# Patient Record
Sex: Male | Born: 1955
Health system: Southern US, Community
[De-identification: ages and names within clinical notes are randomized; demographics above are authoritative.]

## PROBLEM LIST (undated history)

## (undated) DIAGNOSIS — Z794 Long term (current) use of insulin: Secondary | ICD-10-CM

## (undated) DIAGNOSIS — B351 Tinea unguium: Secondary | ICD-10-CM

## (undated) DIAGNOSIS — R519 Headache, unspecified: Secondary | ICD-10-CM

## (undated) DIAGNOSIS — Z98811 Dental restoration status: Secondary | ICD-10-CM

## (undated) DIAGNOSIS — Z923 Personal history of irradiation: Secondary | ICD-10-CM

## (undated) DIAGNOSIS — H938X9 Other specified disorders of ear, unspecified ear: Secondary | ICD-10-CM

## (undated) DIAGNOSIS — F32A Depression, unspecified: Secondary | ICD-10-CM

## (undated) DIAGNOSIS — IMO0001 Reserved for inherently not codable concepts without codable children: Secondary | ICD-10-CM

## (undated) DIAGNOSIS — E669 Obesity, unspecified: Secondary | ICD-10-CM

## (undated) DIAGNOSIS — K219 Gastro-esophageal reflux disease without esophagitis: Secondary | ICD-10-CM

## (undated) DIAGNOSIS — G4730 Sleep apnea, unspecified: Secondary | ICD-10-CM

## (undated) DIAGNOSIS — I10 Essential (primary) hypertension: Secondary | ICD-10-CM

## (undated) DIAGNOSIS — L300 Nummular dermatitis: Secondary | ICD-10-CM

## (undated) DIAGNOSIS — G5601 Carpal tunnel syndrome, right upper limb: Secondary | ICD-10-CM

## (undated) DIAGNOSIS — M1990 Unspecified osteoarthritis, unspecified site: Secondary | ICD-10-CM

## (undated) DIAGNOSIS — E785 Hyperlipidemia, unspecified: Secondary | ICD-10-CM

## (undated) DIAGNOSIS — E119 Type 2 diabetes mellitus without complications: Secondary | ICD-10-CM

## (undated) DIAGNOSIS — B999 Unspecified infectious disease: Secondary | ICD-10-CM

## (undated) DIAGNOSIS — F419 Anxiety disorder, unspecified: Secondary | ICD-10-CM

## (undated) DIAGNOSIS — F329 Major depressive disorder, single episode, unspecified: Secondary | ICD-10-CM

## (undated) DIAGNOSIS — B370 Candidal stomatitis: Principal | ICD-10-CM

## (undated) HISTORY — DX: Other specified disorders of ear, unspecified ear: H93.8X9

## (undated) HISTORY — DX: Candidal stomatitis: B37.0

## (undated) HISTORY — DX: Sleep apnea, unspecified: G47.30

## (undated) HISTORY — DX: Obesity, unspecified: E66.9

## (undated) HISTORY — DX: Unspecified infectious disease: B99.9

## (undated) HISTORY — DX: Essential (primary) hypertension: I10

## (undated) HISTORY — DX: Tinea unguium: B35.1

## (undated) HISTORY — PX: CYST EXCISION: SHX5701

## (undated) HISTORY — DX: Hyperlipidemia, unspecified: E78.5

## (undated) HISTORY — PX: COLONOSCOPY: SHX174

## (undated) HISTORY — PX: FOOT SURGERY: SHX648

---

## 1998-01-05 ENCOUNTER — Emergency Department (HOSPITAL_COMMUNITY): Admission: EM | Admit: 1998-01-05 | Discharge: 1998-01-05 | Payer: Self-pay | Admitting: Emergency Medicine

## 2003-04-05 DIAGNOSIS — L509 Urticaria, unspecified: Secondary | ICD-10-CM | POA: Insufficient documentation

## 2003-08-30 ENCOUNTER — Emergency Department (HOSPITAL_COMMUNITY): Admission: EM | Admit: 2003-08-30 | Discharge: 2003-08-30 | Payer: Self-pay | Admitting: Emergency Medicine

## 2006-11-12 ENCOUNTER — Ambulatory Visit: Payer: Self-pay | Admitting: Family Medicine

## 2006-12-30 ENCOUNTER — Observation Stay (HOSPITAL_COMMUNITY): Admission: EM | Admit: 2006-12-30 | Discharge: 2006-12-30 | Payer: Self-pay | Admitting: Emergency Medicine

## 2006-12-30 ENCOUNTER — Ambulatory Visit: Payer: Self-pay | Admitting: Family Medicine

## 2006-12-30 HISTORY — PX: CARDIAC CATHETERIZATION: SHX172

## 2007-01-13 ENCOUNTER — Ambulatory Visit: Payer: Self-pay | Admitting: Family Medicine

## 2007-03-21 ENCOUNTER — Ambulatory Visit (HOSPITAL_COMMUNITY): Admission: RE | Admit: 2007-03-21 | Discharge: 2007-03-21 | Payer: Self-pay | Admitting: Surgery

## 2007-03-25 ENCOUNTER — Ambulatory Visit (HOSPITAL_COMMUNITY): Admission: RE | Admit: 2007-03-25 | Discharge: 2007-03-25 | Payer: Self-pay | Admitting: Gastroenterology

## 2007-03-25 HISTORY — PX: ESOPHAGOGASTRODUODENOSCOPY: SHX1529

## 2007-03-28 ENCOUNTER — Ambulatory Visit (HOSPITAL_COMMUNITY): Admission: RE | Admit: 2007-03-28 | Discharge: 2007-03-28 | Payer: Self-pay | Admitting: Surgery

## 2007-03-31 ENCOUNTER — Encounter: Admission: RE | Admit: 2007-03-31 | Discharge: 2007-03-31 | Payer: Self-pay | Admitting: Surgery

## 2007-05-22 ENCOUNTER — Ambulatory Visit: Payer: Self-pay | Admitting: Family Medicine

## 2007-07-10 ENCOUNTER — Ambulatory Visit: Payer: Self-pay | Admitting: Family Medicine

## 2007-07-10 ENCOUNTER — Ambulatory Visit: Payer: Self-pay | Admitting: *Deleted

## 2007-07-10 ENCOUNTER — Ambulatory Visit (HOSPITAL_COMMUNITY): Admission: RE | Admit: 2007-07-10 | Discharge: 2007-07-10 | Payer: Self-pay | Admitting: Family Medicine

## 2007-07-14 ENCOUNTER — Encounter: Admission: RE | Admit: 2007-07-14 | Discharge: 2007-10-12 | Payer: Self-pay | Admitting: Surgery

## 2007-07-17 ENCOUNTER — Ambulatory Visit: Payer: Self-pay | Admitting: Family Medicine

## 2007-07-28 ENCOUNTER — Inpatient Hospital Stay (HOSPITAL_COMMUNITY): Admission: RE | Admit: 2007-07-28 | Discharge: 2007-07-31 | Payer: Self-pay | Admitting: Surgery

## 2007-07-28 HISTORY — PX: ROUX-EN-Y PROCEDURE: SUR1287

## 2007-07-29 ENCOUNTER — Ambulatory Visit: Payer: Self-pay | Admitting: Surgery

## 2007-07-29 ENCOUNTER — Encounter (INDEPENDENT_AMBULATORY_CARE_PROVIDER_SITE_OTHER): Payer: Self-pay | Admitting: Surgery

## 2007-08-11 ENCOUNTER — Ambulatory Visit: Payer: Self-pay | Admitting: Family Medicine

## 2007-09-15 ENCOUNTER — Ambulatory Visit: Payer: Self-pay | Admitting: Family Medicine

## 2007-11-05 ENCOUNTER — Ambulatory Visit: Payer: Self-pay | Admitting: Family Medicine

## 2007-11-06 ENCOUNTER — Encounter: Admission: RE | Admit: 2007-11-06 | Discharge: 2007-11-06 | Payer: Self-pay | Admitting: Surgery

## 2007-11-12 ENCOUNTER — Encounter: Admission: RE | Admit: 2007-11-12 | Discharge: 2007-11-12 | Payer: Self-pay | Admitting: Family Medicine

## 2007-11-13 ENCOUNTER — Ambulatory Visit: Payer: Self-pay | Admitting: Family Medicine

## 2007-12-17 ENCOUNTER — Encounter: Admission: RE | Admit: 2007-12-17 | Discharge: 2007-12-17 | Payer: Self-pay | Admitting: Obstetrics and Gynecology

## 2008-04-14 ENCOUNTER — Encounter: Payer: Self-pay | Admitting: Gastroenterology

## 2008-04-14 ENCOUNTER — Ambulatory Visit: Payer: Self-pay | Admitting: Family Medicine

## 2008-04-23 ENCOUNTER — Ambulatory Visit: Payer: Self-pay | Admitting: Family Medicine

## 2008-05-11 ENCOUNTER — Ambulatory Visit: Payer: Self-pay | Admitting: Gastroenterology

## 2008-05-11 DIAGNOSIS — E1169 Type 2 diabetes mellitus with other specified complication: Secondary | ICD-10-CM | POA: Insufficient documentation

## 2008-05-11 DIAGNOSIS — E785 Hyperlipidemia, unspecified: Secondary | ICD-10-CM

## 2008-05-11 DIAGNOSIS — K219 Gastro-esophageal reflux disease without esophagitis: Secondary | ICD-10-CM | POA: Insufficient documentation

## 2008-05-11 DIAGNOSIS — E119 Type 2 diabetes mellitus without complications: Secondary | ICD-10-CM | POA: Insufficient documentation

## 2008-05-20 ENCOUNTER — Ambulatory Visit: Payer: Self-pay | Admitting: Family Medicine

## 2008-07-07 ENCOUNTER — Ambulatory Visit: Payer: Self-pay | Admitting: Gastroenterology

## 2008-12-23 ENCOUNTER — Ambulatory Visit: Payer: Self-pay | Admitting: Family Medicine

## 2009-01-13 ENCOUNTER — Ambulatory Visit: Payer: Self-pay | Admitting: Family Medicine

## 2009-01-20 IMAGING — US US ABDOMEN COMPLETE
1 series · 14 of 25 positions shown · non-contrast
Comparison: none

CLINICAL DATA: 51-year-old with morbid obesity.  Undergoing bariatric surgery evaluation.  GERD, hypertension, diabetes.
 ABDOMEN ULTRASOUND:
TECHNIQUE: Complete abdominal ultrasound examination was performed including evaluation of the liver, gallbladder, bile ducts, pancreas, kidneys, spleen, IVC, and abdominal aorta.

[Series 1: us abdomen complete · 14 of 51 slices shown]
[im 1/51]
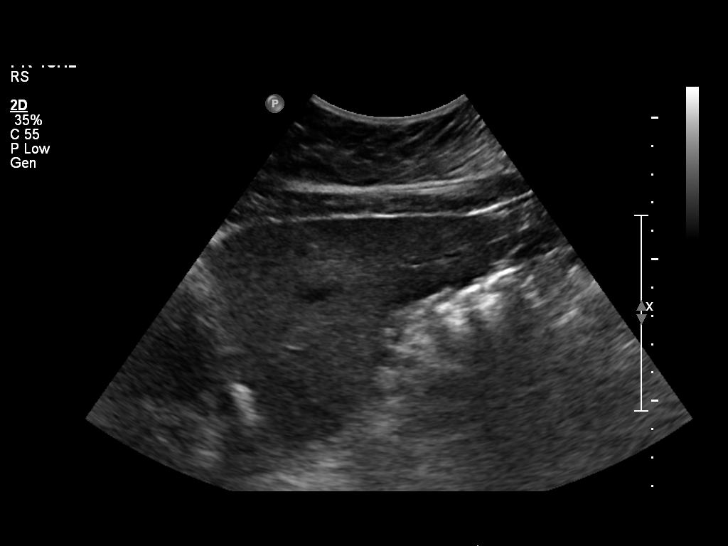
[im 5/51]
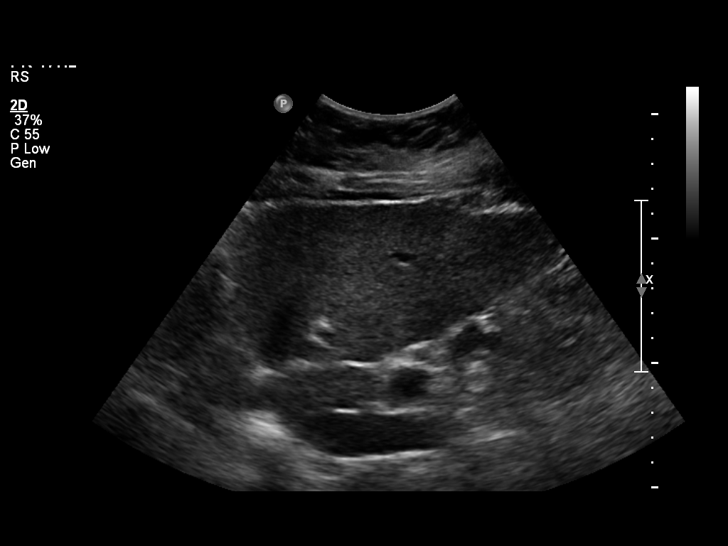
[im 9/51]
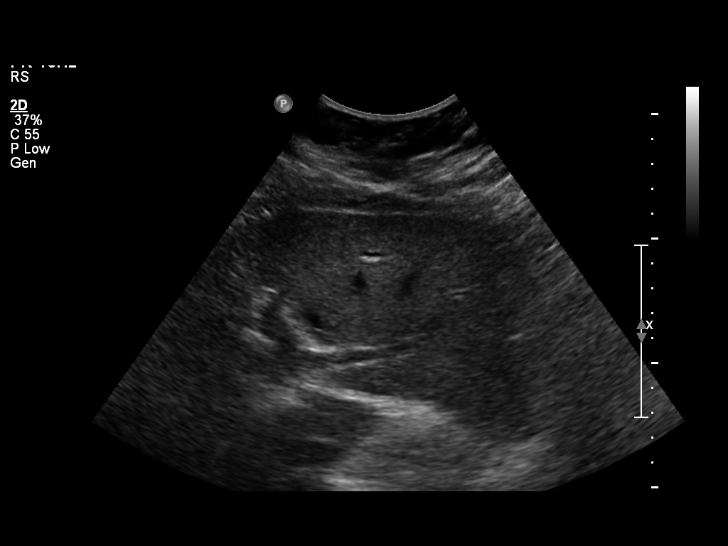
[im 13/51]
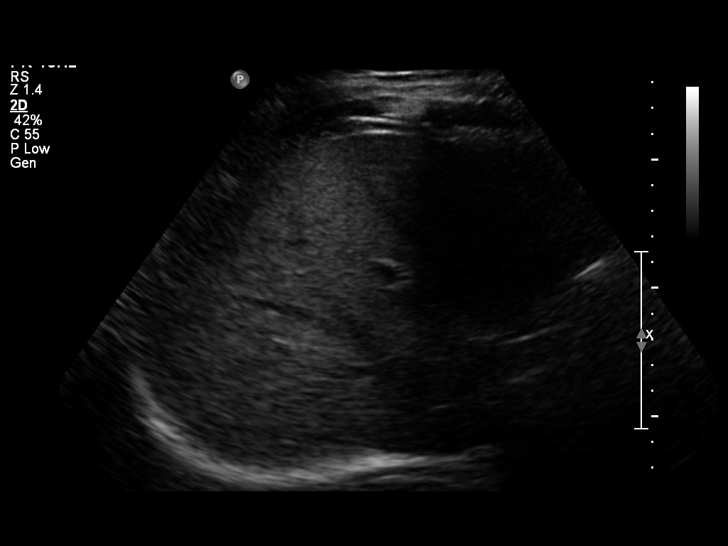
[im 17/51]
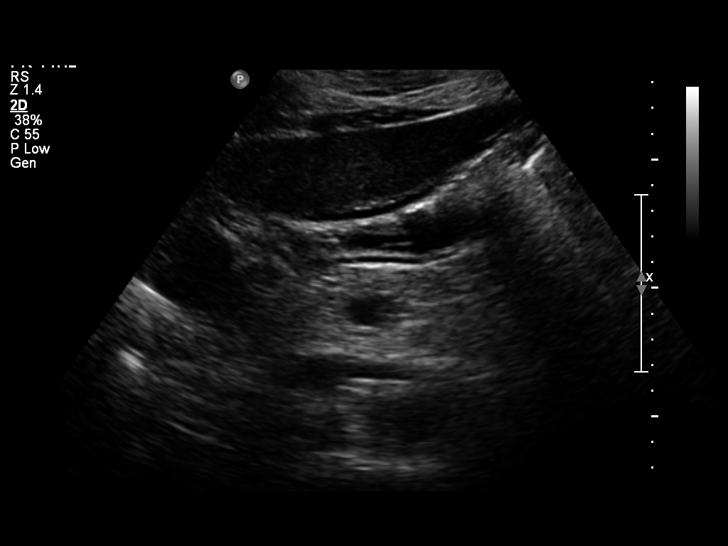
[im 19/51]
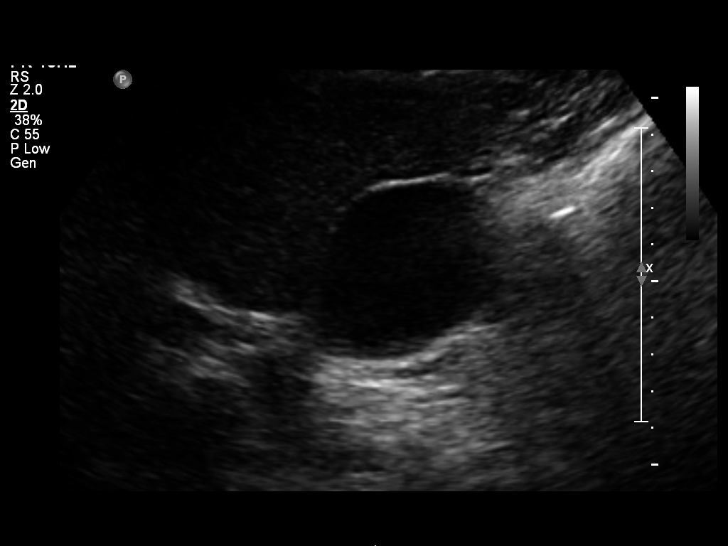
[im 23/51]
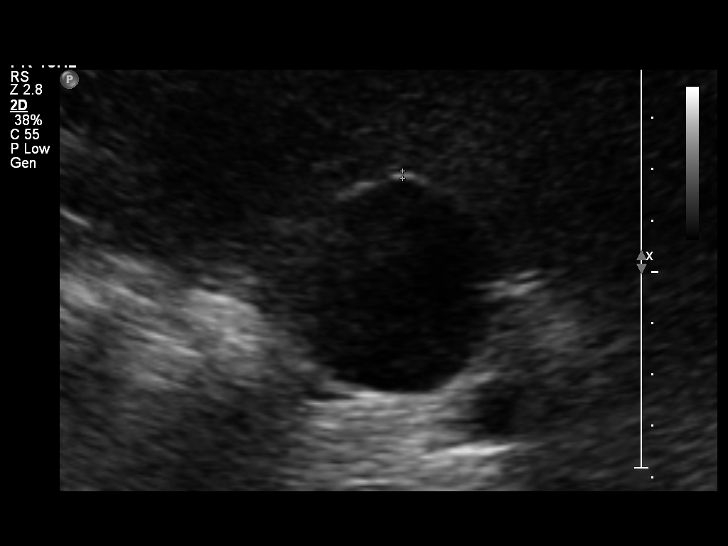
[im 28/51]
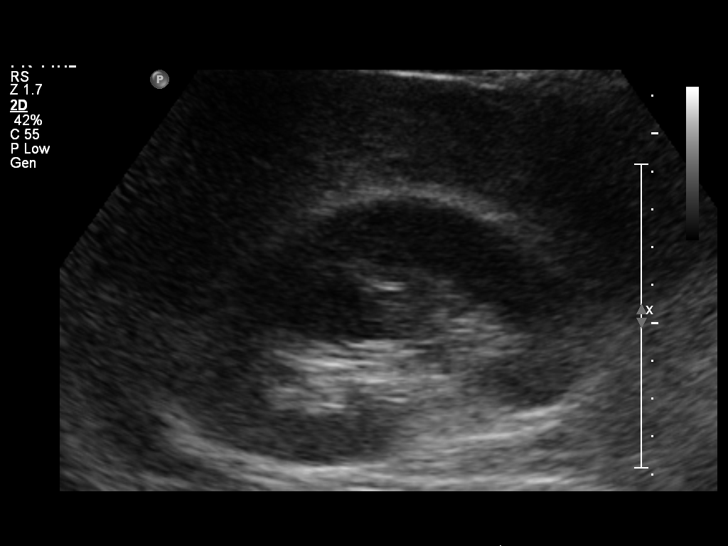
[im 32/51]
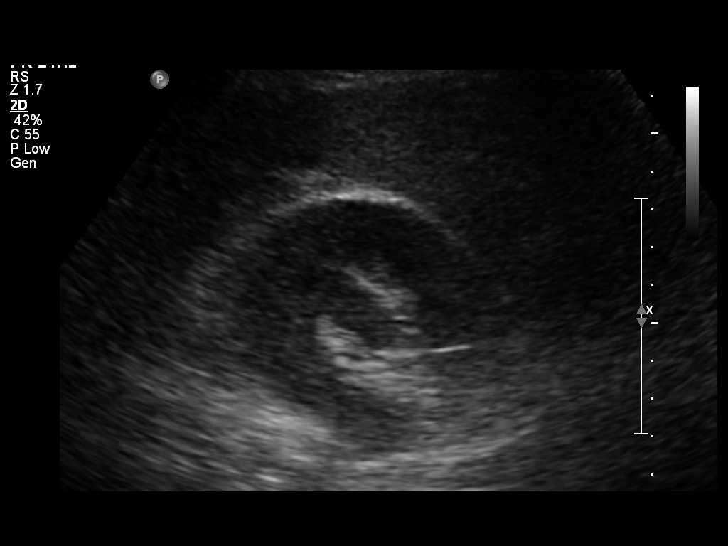
[im 34/51]
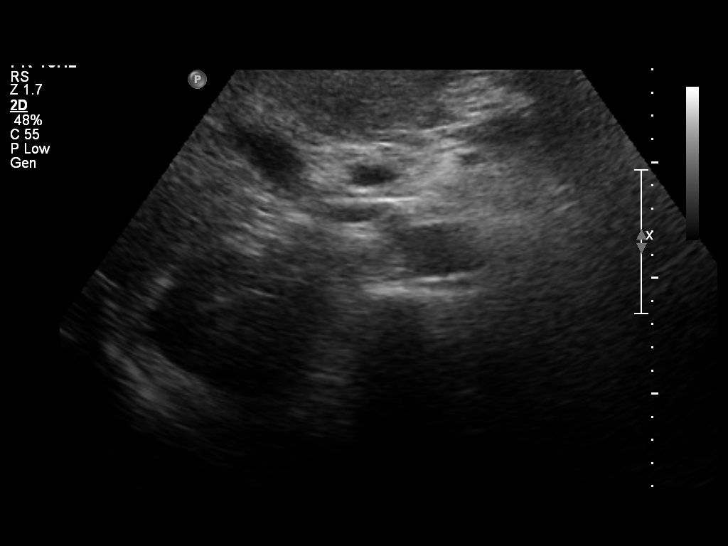
[im 38/51]
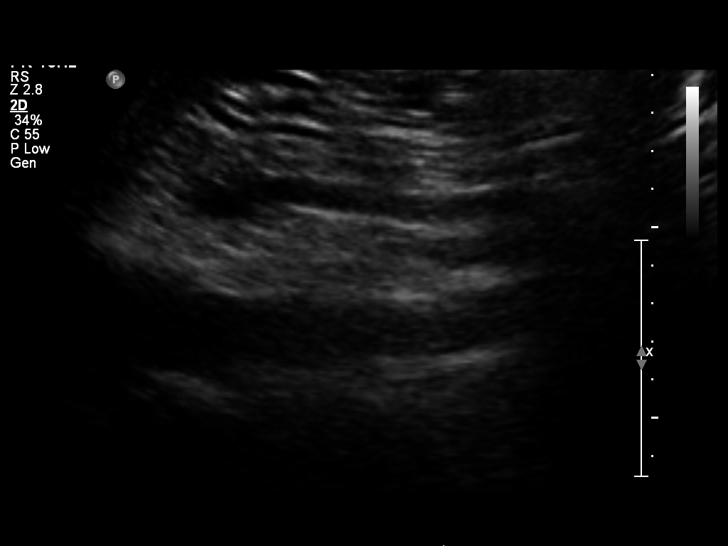
[im 42/51]
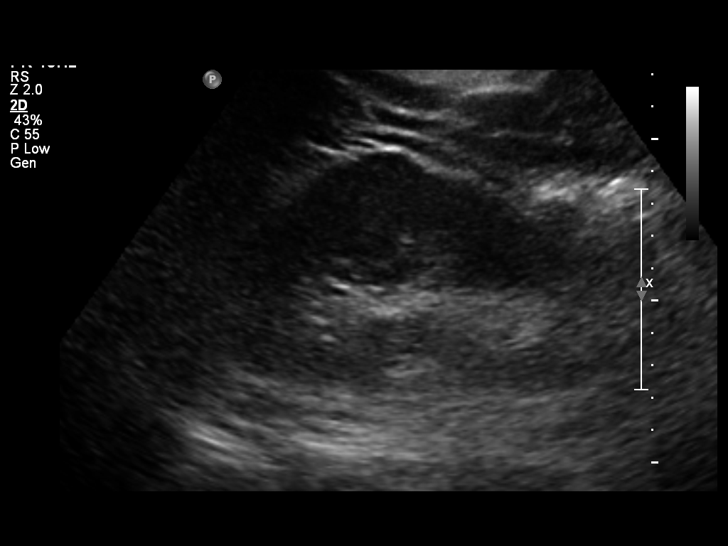
[im 46/51]
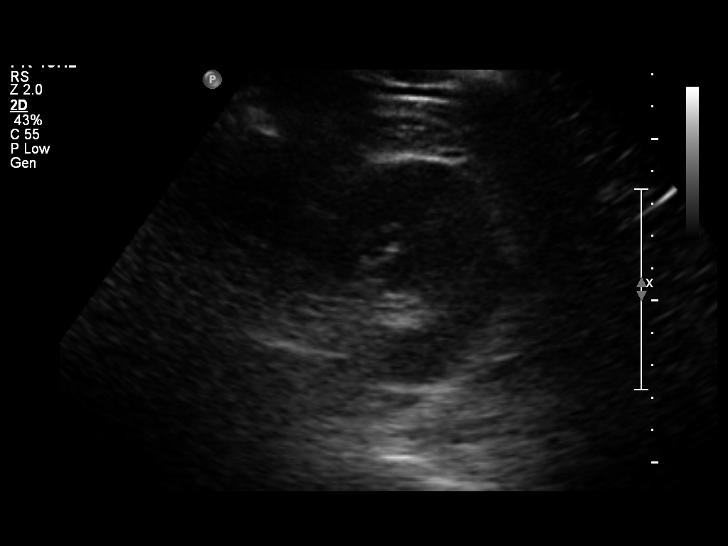
[im 51/51]
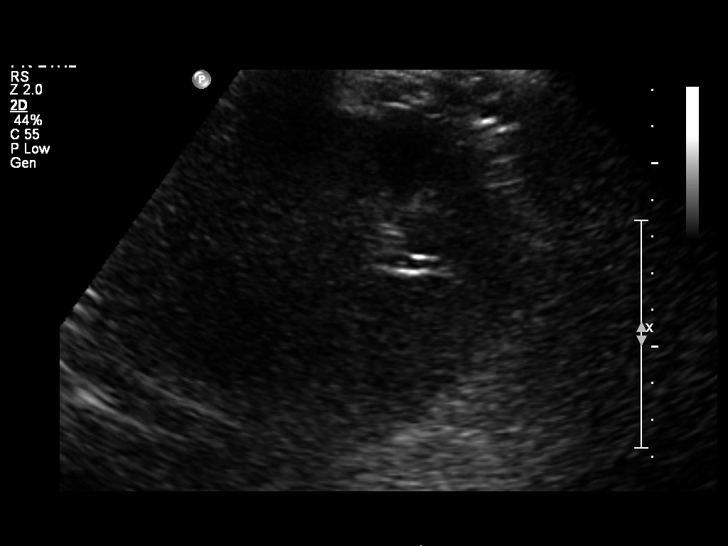

[14 of 25 positions shown; findings below may reference images not displayed]

FINDINGS: The liver, common bile duct, and gallbladder have a normal appearance.  No gallstones are identified.  The IVC and visualized abdominal aorta are normal in appearance.  Pancreatic tail is not well seen but the pancreatic head and neck have a normal appearance by ultrasound.  The spleen and kidneys are normal in their ultrasound appearance.
IMPRESSION: No evidence of acute abnormality of the abdomen.

## 2009-02-01 ENCOUNTER — Ambulatory Visit: Payer: Self-pay | Admitting: Family Medicine

## 2009-05-12 DIAGNOSIS — J069 Acute upper respiratory infection, unspecified: Secondary | ICD-10-CM | POA: Insufficient documentation

## 2009-06-01 ENCOUNTER — Ambulatory Visit: Payer: Self-pay | Admitting: Family Medicine

## 2009-09-19 ENCOUNTER — Ambulatory Visit: Payer: Self-pay | Admitting: Family Medicine

## 2010-01-16 ENCOUNTER — Ambulatory Visit: Payer: Self-pay | Admitting: Family Medicine

## 2010-04-10 ENCOUNTER — Ambulatory Visit: Payer: Self-pay | Admitting: Family Medicine

## 2010-08-01 NOTE — Miscellaneous (Signed)
Summary: Waiver of Liability Form/[facility]  Waiver of Liability Form/[facility]   Imported By: Esmeralda Links D'jimraou 05/13/2008 15:04:10  _____________________________________________________________________  External Attachment:    Type:   Image     Comment:   External Document

## 2010-08-01 NOTE — Procedures (Signed)
Summary: Colonoscopy   Colonoscopy  Procedure date:  07/07/2008  Findings:      Location:  [facility].    Procedures Next Due Date:    Colonoscopy: 07/2018  COLONOSCOPY PROCEDURE REPORT  PATIENT:  [name], [name]  MR#:  [id number] BIRTHDATE:   June 08, 1956   GENDER:   male  ENDOSCOPIST:   Venita Lick. Russella Dar, MD, [facility] Referred by: Sharlot Gowda, M.D.  PROCEDURE DATE:  07/07/2008 PROCEDURE:  Colonoscopy, diagnostic ASA CLASS:   Class II INDICATIONS: 1) screening   MEDICATIONS:    Fentanyl 75 mcg IV, Versed 7 mg IV  DESCRIPTION OF PROCEDURE:   After the risks benefits and alternatives of the procedure were thoroughly explained, informed consent was obtained.  Digital rectal exam was performed and revealed no abnormalities.   The LB [id number]-[id number] endoscope was introduced through the anus and advanced to the cecum, which was identified by both the appendix and ileocecal valve, without limitations.  The quality of the prep was good, using MoviPrep.  The instrument was then slowly withdrawn as the colon was fully examined. <<PROCEDUREIMAGES>>          <<OLD IMAGES>>  FINDINGS:  Mild diverticulosis was found in the ascending colon.  otherwise normal exam.   Retroflexed views in the rectum revealed hemorrhoids.  small, internal  The time to cecum =  2.75  minutes. The scope was then withdrawn (time =  9.5  min) from the patient and the procedure completed.  COMPLICATIONS:   None  ENDOSCOPIC IMPRESSION:  1) Mild diverticulosis in the ascending colon  2) Hemorrhoids RECOMMENDATIONS:  1) high fiber diet  REPEAT EXAM:   In 10 year(s) for Colonoscopy.   _______________________________ Venita Lick. Russella Dar, MD, [facility]    CC: Sharlot Gowda, MD

## 2010-08-01 NOTE — Assessment & Plan Note (Signed)
Summary: SCREEN FOR COLON/INSULIN DEPENDENT/YF   History of Present Illness Visit Type: consult Primary GI MD: Elie Goody MD [facility] Primary Provider: Sharlot Gowda, MD Requesting Provider: Sharlot Gowda, Md Chief Complaint: screening for colonoscopy/IDDM History of Present Illness:   This is a 55 year old male with insulin-dependent diabetes mellitus, hypertension and sleep apnea treated with CPAP and is sent for consideration of colonoscopy for colorectal cancer screening. He is status post a Roux-en-Y gastric bypass in January 2009 and has lost 110 pounds. He feels his weight is stabilized. He has frequent belching, bloating and notes a loss of appetite. He has been previously treated for GERD but has not recently been on medications. He notes alternating diarrhea and constipation.   GI Review of Systems    Reports belching, bloating, loss of appetite, and  weight loss.   Weight loss of 110 pounds   Denies abdominal pain, acid reflux, chest pain, dysphagia with liquids, dysphagia with solids, heartburn, nausea, vomiting, vomiting blood, and  weight gain.      Reports change in bowel habits, constipation, diarrhea, diverticulosis, and  fecal incontinence.     Denies anal fissure, black tarry stools, heme positive stool, hemorrhoids, irritable bowel syndrome, jaundice, light color stool, liver problems, rectal bleeding, and  rectal pain.    Prior Medications Reviewed Using: Patient Recall  Updated Prior Medication List: [facility] GAS RELIEF 125 MG CAPS (SIMETHICONE) one tablet by mouth as needed FLUOXETINE HCL 20 MG TABS (FLUOXETINE HCL) one tablet by mouth once daily WELLBUTRIN SR 200 MG XR12H-TAB (BUPROPION HCL) one tablet by mouth two times a day ASTELIN 137 MCG/SPRAY SOLN (AZELASTINE HCL) once daily ASPIRIN 81 MG  TABS (ASPIRIN) one tablet by mouth once daily LANTUS 100 UNIT/ML SOLN (INSULIN GLARGINE) 25 units once daily B-12 250 MCG TABS (CYANOCOBALAMIN) one tablet by mouth once daily  METFORMIN HCL 1000 MG TABS (METFORMIN HCL) one tablet by mouth two times a day CALCIUM 600 1500 MG TABS (CALCIUM CARBONATE) one tablet by mouth once daily MULTIVITAMINS  TABS (MULTIPLE VITAMIN) 1 tablet by mouth once daily  Current Allergies (reviewed today): No known allergies  Past Medical History:    Diabetes mellitus, insulin-dependent    Depression    Allergic rhinitis    Obesity    Hyperlipidemia    Hypertension    Sleep Apnea CPAP    GERD    Arthritis  Past Surgical History:    Roux-en-Y gastric bypass 07/2007  Family History:    Reviewed history from 05/07/2008 and no changes required:       Family History of Heart Disease:brother x 2 and mother        Family History of Diabetes: maternal grandmother,father, brother       No FH of Colon Cancer:  Social History:    Reviewed history from 05/07/2008 and no changes required:       Patient is a former smoker.        Alcohol Use - no       Daily Caffeine Use  Risk Factors:  Caffeine use:  2 drinks per day Alcohol use:  no  Review of Systems       The patient complains of arthritis/joint pain, depression-new, sleeping problems, and swelling of feet/legs.         The pertinent positives and negatives are noted as above and in the HPI. All other ROS were negative.   Vital Signs:  Patient Profile:   55 Years Old Male Height:  71 inches Weight:      256 pounds BMI:     35.83 BSA:     2.34 Pulse rate:   64 / minute Pulse rhythm:   regular BP sitting:   108 / 56  (right arm)  Vitals Entered By: Milford Cage CMA (May 11, 2008 2:02 PM)                  Physical Exam  General:     Well developed, well nourished, no acute distress. Head:     Normocephalic and atraumatic. Eyes:     PERRLA, no icterus. Ears:     Normal auditory acuity. Mouth:     No deformity or lesions, dentition normal. Neck:     Supple; no masses or thyromegaly. Lungs:     Clear throughout to auscultation. Heart:      Regular rate and rhythm; no murmurs, rubs,  or bruits. Abdomen:     Soft, nontender and nondistended. No masses, hepatosplenomegaly or hernias noted. Normal bowel sounds. Rectal:     deferred until time of colonoscopy.   Msk:     Symmetrical with no gross deformities. Normal posture. Pulses:     Normal pulses noted. Extremities:     No clubbing, cyanosis, edema or deformities noted. Neurologic:     Alert and  oriented x4;  grossly normal neurologically. Skin:     Intact without significant lesions or rashes. Cervical Nodes:     No significant cervical adenopathy. Psych:     Alert and cooperative. Normal mood and affect.   Impression & Recommendations:  Problem # 1:  SPECIAL SCREENING FOR MALIGNANT NEOPLASMS COLON (ICD-V76.51) He notes problems with alternating diarrhea and constipation. The risks, benefits and alternatives to colonoscopy with possible biopsy and possible polypectomy were discussed with the patient and they consent to proceed. The procedure will be scheduled electively. Orders: ZCOL (ZCOL)   Problem # 2:  DM (ICD-250.00) Standard insulin adjustments as per protocol to allow for clear liquid diet and n.p.o. status on the day of the colonoscopy.  Problem # 3:  GERD (ICD-530.81) GERD versus symptoms secondary to his gastric bypass. Begin Prilosec OTC 20 mg p.o. q.a.m. and antireflux measures.  Patient Instructions: 1)  You have been scheduled for a colonoscopy. 2)  Colonoscopy brochure given. 3)  Conscious Sedation brochure given. 4)  Copy Sent To: Thyra Breed, MD  Prescriptions: MOVIPREP 100 GM  SOLR (PEG-KCL-NACL-NASULF-NA ASC-C) As per prep instructions.  #1 x 0   Entered by:   Teresita Madura CMA   Authorized by:   Meryl Dare MD [facility]   Signed by:   Teresita Madura CMA on 05/11/2008   Method used:   Electronically to        [facility]* (retail)       [address]       [place], [place]  [address]       Ph: [phone]       Fax:  [phone]   RxID:   [id number]

## 2010-08-01 NOTE — Procedures (Signed)
Summary: Instructions for Procedure/[facility] (Outpt)  Instructions for Procedure/[facility] (Outpt)   Imported By: Esmeralda Links D'jimraou 05/13/2008 15:03:28  _____________________________________________________________________  External Attachment:    Type:   Image     Comment:   External Document

## 2010-10-16 ENCOUNTER — Ambulatory Visit (INDEPENDENT_AMBULATORY_CARE_PROVIDER_SITE_OTHER): Payer: 59 | Admitting: Family Medicine

## 2010-10-16 DIAGNOSIS — L0501 Pilonidal cyst with abscess: Secondary | ICD-10-CM

## 2010-10-16 DIAGNOSIS — M79609 Pain in unspecified limb: Secondary | ICD-10-CM

## 2010-10-16 LAB — GLUCOSE, CAPILLARY
Glucose-Capillary: 105 mg/dL — ABNORMAL HIGH (ref 70–99)
Glucose-Capillary: 111 mg/dL — ABNORMAL HIGH (ref 70–99)

## 2010-10-18 ENCOUNTER — Encounter: Payer: Self-pay | Admitting: Family Medicine

## 2010-10-18 DIAGNOSIS — L509 Urticaria, unspecified: Secondary | ICD-10-CM

## 2010-10-18 DIAGNOSIS — F32A Depression, unspecified: Secondary | ICD-10-CM

## 2010-10-18 DIAGNOSIS — F329 Major depressive disorder, single episode, unspecified: Secondary | ICD-10-CM

## 2010-10-18 DIAGNOSIS — J069 Acute upper respiratory infection, unspecified: Secondary | ICD-10-CM

## 2010-11-13 ENCOUNTER — Ambulatory Visit (INDEPENDENT_AMBULATORY_CARE_PROVIDER_SITE_OTHER): Payer: 59 | Admitting: Family Medicine

## 2010-11-13 ENCOUNTER — Encounter: Payer: Self-pay | Admitting: Family Medicine

## 2010-11-13 VITALS — BP 116/70 | HR 72 | Ht 61.32 in | Wt 285.0 lb

## 2010-11-13 DIAGNOSIS — R21 Rash and other nonspecific skin eruption: Secondary | ICD-10-CM

## 2010-11-13 DIAGNOSIS — J309 Allergic rhinitis, unspecified: Secondary | ICD-10-CM

## 2010-11-13 DIAGNOSIS — Z0000 Encounter for general adult medical examination without abnormal findings: Secondary | ICD-10-CM

## 2010-11-13 DIAGNOSIS — E1159 Type 2 diabetes mellitus with other circulatory complications: Secondary | ICD-10-CM

## 2010-11-13 DIAGNOSIS — E669 Obesity, unspecified: Secondary | ICD-10-CM

## 2010-11-13 DIAGNOSIS — E119 Type 2 diabetes mellitus without complications: Secondary | ICD-10-CM

## 2010-11-13 DIAGNOSIS — G4730 Sleep apnea, unspecified: Secondary | ICD-10-CM

## 2010-11-13 DIAGNOSIS — I10 Essential (primary) hypertension: Secondary | ICD-10-CM

## 2010-11-13 DIAGNOSIS — I152 Hypertension secondary to endocrine disorders: Secondary | ICD-10-CM

## 2010-11-13 DIAGNOSIS — E1169 Type 2 diabetes mellitus with other specified complication: Secondary | ICD-10-CM

## 2010-11-13 DIAGNOSIS — Z79899 Other long term (current) drug therapy: Secondary | ICD-10-CM

## 2010-11-13 DIAGNOSIS — E785 Hyperlipidemia, unspecified: Secondary | ICD-10-CM

## 2010-11-13 LAB — COMPREHENSIVE METABOLIC PANEL
ALT: 25 U/L (ref 0–53)
AST: 24 U/L (ref 0–37)
Albumin: 4.2 g/dL (ref 3.5–5.2)
Alkaline Phosphatase: 70 U/L (ref 39–117)
BUN: 15 mg/dL (ref 6–23)
CO2: 24 mEq/L (ref 19–32)
Calcium: 9.5 mg/dL (ref 8.4–10.5)
Chloride: 104 mEq/L (ref 96–112)
Creat: 0.79 mg/dL (ref 0.40–1.50)
Glucose, Bld: 120 mg/dL — ABNORMAL HIGH (ref 70–99)
Potassium: 4.5 mEq/L (ref 3.5–5.3)
Sodium: 140 mEq/L (ref 135–145)
Total Bilirubin: 0.5 mg/dL (ref 0.3–1.2)
Total Protein: 6.4 g/dL (ref 6.0–8.3)

## 2010-11-13 LAB — CBC WITH DIFFERENTIAL/PLATELET
Basophils Absolute: 0 10*3/uL (ref 0.0–0.1)
Basophils Relative: 0 % (ref 0–1)
Eosinophils Absolute: 0.1 10*3/uL (ref 0.0–0.7)
Eosinophils Relative: 2 % (ref 0–5)
HCT: 39.4 % (ref 39.0–52.0)
Hemoglobin: 13.8 g/dL (ref 13.0–17.0)
Lymphocytes Relative: 31 % (ref 12–46)
Lymphs Abs: 2.9 10*3/uL (ref 0.7–4.0)
MCH: 31.8 pg (ref 26.0–34.0)
MCHC: 35 g/dL (ref 30.0–36.0)
MCV: 90.8 fL (ref 78.0–100.0)
Monocytes Absolute: 0.7 10*3/uL (ref 0.1–1.0)
Monocytes Relative: 7 % (ref 3–12)
Neutro Abs: 5.7 10*3/uL (ref 1.7–7.7)
Neutrophils Relative %: 60 % (ref 43–77)
Platelets: 273 10*3/uL (ref 150–400)
RBC: 4.34 MIL/uL (ref 4.22–5.81)
RDW: 13.1 % (ref 11.5–15.5)
WBC: 9.5 10*3/uL (ref 4.0–10.5)

## 2010-11-13 LAB — LIPID PANEL
Cholesterol: 115 mg/dL (ref 0–200)
HDL: 38 mg/dL — ABNORMAL LOW (ref >39)
LDL Cholesterol: 60 mg/dL (ref 0–99)
Total CHOL/HDL Ratio: 3 Ratio
Triglycerides: 83 mg/dL (ref <150)
VLDL: 17 mg/dL (ref 0–40)

## 2010-11-13 LAB — POCT URINALYSIS DIPSTICK
Bilirubin, UA: NEGATIVE
Blood, UA: NEGATIVE
Ketones, UA: NEGATIVE
Leukocytes, UA: NEGATIVE
Nitrite, UA: NEGATIVE
Protein, UA: NEGATIVE
Spec Grav, UA: 1.01
Urobilinogen, UA: NEGATIVE
pH, UA: 7

## 2010-11-13 NOTE — Progress Notes (Signed)
  Subjective:    Patient ID: [name], male    DOB: 05-Feb-1956, 55 y.o.   MRN: [id number]  HPI he is here for complete examination. Continues to be followed by his endocrinologist for underlying diabetes. Uses over-the-counter medications for arthritis especially his shoulders. His allergies are under good control. He continues to be seen by a psychiatrist and psychologist. He continues to struggle with his weight in spite of having gastric bypass. His exercise is very sporadic. He  he does have a CPAP however he has not had it checked within the last year. Allergies seem to be under good control. He did have a colonoscopy in 2009. He does complain of lesion on his right wrist   Review of Systems  Constitutional: Negative.   HENT: Negative.   Eyes: Negative.   Respiratory: Positive for apnea.   Cardiovascular: Negative.   Gastrointestinal: Negative.   Genitourinary: Negative.   Musculoskeletal: Positive for arthralgias.  Hematological: Negative.        Objective:   Physical ExamBP 116/70  Pulse 72  Ht 5' 1.32" (1.558 m)  Wt 285 lb (129.275 kg)  BMI 53.29 kg/m2  General Appearance:    Alert, cooperative, no distress, appears stated age  Head:    Normocephalic, without obvious abnormality, atraumatic  Eyes:    PERRL, conjunctiva/corneas clear, EOM's intact, fundi    benign  Ears:    Normal TM's and external ear canals  Nose:   Nares normal, mucosa normal, no drainage or sinus   tenderness  Throat:   Lips, mucosa, and tongue normal; teeth and gums normal  Neck:   Supple, no lymphadenopathy;  thyroid:  no   enlargement/tenderness/nodules; no carotid   bruit or JVD  Back:    Spine nontender, no curvature, ROM normal, no CVA     tenderness  Lungs:     Clear to auscultation bilaterally without wheezes, rales or     ronchi; respirations unlabored  Chest Wall:    No tenderness or deformity   Heart:    Regular rate and rhythm, S1 and S2 normal, no murmur, rub   or gallop    Breast Exam:    No chest wall tenderness, masses or gynecomastia  Abdomen:     Soft, non-tender, nondistended, normoactive bowel sounds,    no masses, no hepatosplenomegaly  Genitalia:    Normal male external genitalia without lesions.  Testicles without masses.  No inguinal hernias.  Rectal:    Normal sphincter tone, no masses or tenderness; guaiac negative stool.  Prostate smooth, no nodules, not enlarged.  Extremities:   No clubbing, cyanosis or edema  Pulses:   2+ and symmetric all extremities  Skin:   Skin color, texture, turgor normal, no rashes or lesions  Lymph nodes:   Cervical, supraclavicular, and axillary nodes normal  Neurologic:   CNII-XII intact, normal strength, sensation and gait; reflexes 2+ and symmetric throughout          Psych:   Normal mood, affect, hygiene and grooming.    Exem of the right hand at the base of the thumb does show slight pigment changes.       Assessment & Plan:  See chronic problem list Recommend he use cortisone cream on the right and and if no improvement call me. We will also set him up to get his CPAP machine evaluated. Is to continue to be followed by the endocrinologist. I again reinforced diet and exercise with him. Use over-the-counter allergy meds as needed.

## 2010-11-13 NOTE — Patient Instructions (Signed)
Continue on all your medications. Use cortisone cream for the lesion on her hand. Followup with your diabetes specialist as well as Dr. Evelene Croon

## 2010-11-14 ENCOUNTER — Telehealth: Payer: Self-pay

## 2010-11-14 ENCOUNTER — Telehealth: Payer: Self-pay | Admitting: Family Medicine

## 2010-11-14 MED ORDER — OMEPRAZOLE 40 MG PO CPDR: 40.0000 mg | DELAYED_RELEASE_CAPSULE | Freq: Every day | ORAL | Status: DC

## 2010-11-14 NOTE — Op Note (Signed)
NAMERODRIGUEZ, AGUINALDO            ACCOUNT NO.:  [id number]   MEDICAL RECORD NO.:  [id number]          PATIENT TYPE:  AMB   LOCATION:  ENDO                         FACILITY:  [facility]   PHYSICIAN:  James L. Malon Kindle., M.D.DATE OF BIRTH:  [birth year]   DATE OF PROCEDURE:  03/25/2007  DATE OF DISCHARGE:                               OPERATIVE REPORT   PROCEDURE:  Esophagogastroduodenoscopy.   MEDICATIONS:  Cetacaine spray, fentanyl 100 mcg, Versed 7 mg IV.   INDICATIONS:  Esophageal reflux, chest pain.   DESCRIPTION OF PROCEDURE:  Procedure explained to the patient and  consent obtained.  Left lateral decubitus position, the Pentax scope  inserted and advanced under direct visualization.  Stomach entered,  pylorus identified and passed.  Duodenum including bulb and second  portion seen well.  Pyloric channel normal.  Antrum, body and fundus of  the stomach normal.  There was no significant hiatal hernia. The GE  junction appeared patent with no gross hiatal hernia or gross reflux.  Distal and proximal esophagus was endoscopically normal.  The patient  tolerated procedure well and was monitored throughout.   ASSESSMENT:  Nonerosive esophageal reflux disease.   PLAN:  Will continue on current medication, give reflux instructions and  see back in the office in three months.           ______________________________  Llana Aliment. Malon Kindle., M.D.     Waldron Session  D:  03/25/2007  T:  03/26/2007  Job:  [id number]   cc:   Sharlot Gowda, M.D.  Fax: [phone]   Cristy Hilts. Jacinto Halim, MD  Fax: [phone]   Sandria Bales. Ezzard Standing, M.D.  [address]  [place]  [place] [address]

## 2010-11-14 NOTE — Telephone Encounter (Signed)
Home # is wrong left message on work #

## 2010-11-14 NOTE — Discharge Summary (Signed)
[name], [name]            ACCOUNT NO.:  [id number]   MEDICAL RECORD NO.:  [id number]          PATIENT TYPE:  EMS   LOCATION:  [facility]                         FACILITY:  [facility]   PHYSICIAN:  Cristy Hilts. Jacinto Halim, MD       DATE OF BIRTH:  1955-12-03   DATE OF ADMISSION:  12/30/2006  DATE OF DISCHARGE:  12/30/2006                               DISCHARGE SUMMARY   DISCHARGE DIAGNOSES:  1. Chest pain secondary to gastroesophageal reflux disease.      a.     Obstructive coronary disease.  2. Insulin dependent diabetes mellitus.  3. Hypertension, stable.  4. Hyperlipidemia.  5. History of depression.  6. Obstructive sleep apnea, wears continuous positive airway pressure      and oxygen.   CONDITION ON DISCHARGE:  Improved.   PROCEDURE:  Combined left heart cath December 30, 2006, by Dr. Yates Decamp  with mild spasms RCA.  He also had a right heart cath.  EF was 55% to  60% with nonobstructive coronary disease.   He was also noted to have moderate pulmonary hypertension.  His cardiac  output was 7.7, cardiac index 2.9.   DISCHARGE MEDICATIONS:  Please note the patient did not know the dosage  of his medications, therefore, I do not have any way of finding that  information.  He will bring his meds to the office visit with Dr. Allyson Sabal.  1. Lipitor, take as at home.  2. Lisinopril 10 mg daily.  3. Metformin 1,000 mg twice a day, but do not take until Wednesday,      July 2, in the evening.  4. Prozac daily as before.  5. Wellbutrin twice a day as before.  6. Multivitamin daily.  7. Actos daily.  8. Byetta twice a day as before.  9. Lantus 35 units daily.  10.Amaryl 4 mg daily.  11.New amlodipine 5 mg daily.  12.Protonix 40 mg daily.   DISCHARGE INSTRUCTIONS:  1. No work until Monday, January 06, 2007.  2. Low-fat diabetic diet.  3. No driving for 2 days, no lifting for 2 days.  Increase activity      slowly. May shower, bathe starting tomorrow, December 31, 2006.  4. Wash cath site with soap and  water.  Call if any bleeding,      swelling, or drainage.  5. Stop smoking.  6. Followup with Dr. Allyson Sabal in 2 to 3 weeks, the office will call.  7. Followup with Dr. Susann Givens for continued pain.  8. Bring meds with you to see Dr. Allyson Sabal.   HISTORY OF PRESENT ILLNESS:  A 55 year old African American male came to  Dr. Jola Babinski office today for chest pain.  He was found to have some  new ST elevations in his lateral leads and was sent to [facility] by  EMS.  He states on December 30, 2006, he was at work, he had dizziness,  fuzziness in his head, went for a walk to wake himself up.  When walking  he had chest tightness that radiated to his sternum and neck.  No  diaphoresis.  He was short of  breath, but no nausea, vomiting.  Rated  the discomfort 5/10 and lasted greater than an hour.  While being  transported from Dr. Jola Babinski office had aspirin and nitroglycerin  spray, but did not seem to help the pain, in the emergency room it was a  scale of 1-10.   PAST MEDICAL HISTORY:  Premature family history of:  1. Coronary disease.  2. Hypertension.  3. Hyperlipidemia.  4. Depression.  5. Insulin dependent diabetes mellitus.  6. He did have a heart cath in 2005 with 3% proximal LAD.  7. He also has obstructive sleep apnea, wears CPAP.  8. Chronic sinusitis.   Family history, social history, review of systems, please see for all of  those the H&P.   ALLERGIES:  NO KNOWN ALLERGIES.   OUTPATIENT MEDICATIONS:  Same as discharge, except we added amlodipine  and Protonix.   PHYSICAL EXAMINATION:  VITAL SIGNS:  Blood pressure 110/47 to 125/52,  pulse 56, respirations are stable, 100% oxygen saturation.  GENERAL:  Alert and oriented.  In no acute distress.  LUNGS:  Clear.  HEART SOUNDS: S1, S2.  Regular rate and rhythm.  No murmur, gallop, rub,  or click.  ABDOMEN:  Positive bowel sounds.  Right groin cath site, stable.   LABORATORY DATA:  CBC on admission:  Hemoglobin 13.8, hematocrit  41.2,  WBC 7.8, and platelets 272.  Chemistry:  Sodium 127, potassium 4.2,  chloride 107, CO2 24, BUN 14, creatinine 0.74, glucose 122.  Coags pro  time 13.4, INR of 1, PTT 38.  GI:  AST 24, ALT 31, alkaline phosphatase  52, total bili 0.4, albumin 3.7.  CK was 270, MB 2.3, troponin I 0.03,  calcium 9.9, magnesium 2, TSH was pending at discharge and cardiac  markers were negative.   HOSPITAL COURSE:  The patient underwent cardiac catheterization by Dr.  Jacinto Halim, right and left heart cath.  He had a 30% to 40% RCA proximal  stenosis and 30% LAD stenosis, EF was 55% to 60%.  A pulmonary capillary  wedge pressure 24/28/19.  Cardiac output 7.7, index 2.9.  The patient  was then sent to short-stay until he recovered from his  cardiac cath.  In that unit he started having chest discomfort.  He will  be given a GI cocktail, if the GI cocktail does not relieve his  discomfort, we will keep him overnight for management and then discharge  the next day.  If his pain is relieved he will go home and he will have  his Protonix and amlodipine.      [name]. [place], N.P.      Cristy Hilts. Jacinto Halim, MD  Electronically Signed    LRI/MEDQ  D:  12/30/2006  T:  12/31/2006  Job:  [id number]   cc:   Nanetta Batty, M.D.  Sharlot Gowda, M.D.

## 2010-11-14 NOTE — Cardiovascular Report (Signed)
NAMEMEDHANSH, BRINKMEIER            ACCOUNT NO.:  [id number]   MEDICAL RECORD NO.:  [id number]          PATIENT TYPE:  INP   LOCATION:  1823                         FACILITY:  [facility]   PHYSICIAN:  Cristy Hilts. Jacinto Halim, MD       DATE OF BIRTH:  1956-06-09   DATE OF PROCEDURE:  DATE OF DISCHARGE:                            CARDIAC CATHETERIZATION   PROCEDURE PERFORMED:  1. Left ventriculography.  2. Selective right and left coronary arteriography.  3. Right femoral angiography and closure of the right femoral arterial      access with Star close.  4. Right heart catheterization.   INDICATIONS:  Mr. Basil Buffin is a 51-year gentleman with history  of hypertension, hyperlipidemia, diabetes who had been doing well and  has had a mild noncritical coronary artery disease by cardiac  catheterization in 2005, had gone for a walk and had suddenly developed  retrosternal chest heaviness and chest pressure which lasted for an hour  with radiation to his jaw.  This was associated with shortness of  breath.  Given this, he had seen Dr. Sharlot Gowda and because of ongoing  cardiovascular risk.  He was sent over to the [facility] emergency room  for further cardiovascular evaluation.  In the [facility] emergency  room, he was ruled out for myocardial infarction with first set of  cardiac enzymes.  He was completely chest pain free.  We gave him the  option of doing a stress test versus cardiac catheterization.  However,  the patient opted to go with cardiac catheterization and after having  explained the risks, benefits and alternatives, he was brought to the  cardiac catheterization lab to evaluate his coronary anatomy.  Right  heart catheterization was performed with a suspicion that he probably  has pulmonary tension, especially given his shortness of breath and  morbid obesity and obstructive sleep apnea.   HEMODYNAMIC DATA:   RIGHT HEART CATHETERIZATION:  RA pressure was 16/15 with a mean of  12  mmHg.  RA saturation 63%.   RV pressure was 50/3 with end diastolic pressure of 16 mmHg.  RV  saturation 66%.   PA pressure 51/80 with a mean of 32 mmHg.  PA saturation 66%.   Pulmonary capillary wedge was 24/28 with a mean of 19 mmHg.   Cardiac output 7.7.  Cardiac index 2.9 by Fick.   HEMODYNAMIC DATA:   LEFT HEART CATHETERIZATION.:  Left ventricle pressure 100 5/5 with end  diastolic pressure of 15 mmHg.  The aortic pressure 110/63 with a mean  of 84 mmHg.  There was no pressure gradient across the aortic valve.   ANGIOGRAPHIC DATA:  Left ventricle: Left ventricular systolic function  was normal with no regional wall motion abnormality.  Ejection fraction  was 55-60%.  No significant mitral regurgitation.   Right coronary artery:  Right coronary artery is a large vessel and  dominant vessel.  There was catheter induced spasm of the ostium of the  right coronary artery which was relieved with 200 mcg intracoronary  nitroglycerin administration.  There is a 30 to at most 40% ostial  stenosis  which is smooth.   Left main:  Left main is large vessel.  It is smooth and normal.   Circumflex:  Circumflex appears to be codominant.  It is a large-caliber  vessel.  It is tortuous.  Gives origin to moderate sized OM1, OM-2 and  large OM 3.  It is smooth and normal.   LAD:  LAD is large vessel.  It had mild 30% luminal irregularity in its  mid segment after the origin of a small diagonal one.  The LAD is  tortuous.   IMPRESSION:  1. Mild luminal irregularity of the coronary arteries.  No significant      coronary artery stenosis.  2. Moderate pulmonary hypertension with preserved cardiac output and      cardiac index.   RECOMMENDATIONS:  Suspect the chest discomfort was probably related to  his GERD, given morbid obesity and obstructive sleep apnea.  Would  recommend starting him on a PPI.  He will be stable for discharge from  the hospital, but he will be taken off of work  for at least a period of  2 days.  We will also add amlodipine for blood pressure control and also  could help with coronary spasm.  Aggressive risk modification is  indicated.  The patient has now been evaluated for bariatric-type  surgery, and I suspect that given his multiple cardiovascular risk that  this would be a good idea, and I will medically support this.   A total of 100 mL of contrast was utilized for diagnostic angiography.   TECHNIQUE OF THE PROCEDURE:  Under usual sterile precautions using a 6-  French right femoral arterial access and a 7-French right femoral venous  access, left and right heart catheterization was performed respectively.   Right heart catheterization was performed using a balloon-tip Swan-Ganz  catheter which was easily advanced into the pulmonary capillary wedge  position.  The right-sided hemodynamics was obtained and the catheter  was pulled out of body.   Using a 6-French multipurpose B2 catheter which was advanced to the  ascending aorta over the J-wire and into the left ventricle.  Left  ventriculography was performed both in LAO and RAO projection.  The  catheter was then pulled into the ascending aorta and right coronary  artery selectively engaged and angiography was performed.  Then the left  main coronary artery was selectively engaged, angiography was performed.  Because of incomplete visualization, a 6-French Judkins left five  diagnostic catheter was utilized and engage in the left main coronary  artery and angiography was performed.  Then using a 6-French NOTO  catheter, right coronary artery was selective engaged, 200 mcg  intracoronary was administered, angiography was repeated.  Then the  catheter was pulled out of  body in the usual fashion over the J-wire.  Right femoral angiography  was performed through the arterial access sheath and access closed with  Star close with excellent hemostasis.  Venous sheath was pulled in the   cardiac catheterization lab and adequate hemostasis obtained.  The  patient tolerated this.  No immediate complication noted.      Cristy Hilts. Jacinto Halim, MD  Electronically Signed     JRG/MEDQ  D:  12/30/2006  T:  12/31/2006  Job:  [id number]   cc:   Nanetta Batty, M.D.  Sharlot Gowda, M.D.

## 2010-11-14 NOTE — Telephone Encounter (Signed)
Patient would like a 90 day supply of Prilosec

## 2010-11-14 NOTE — Discharge Summary (Signed)
NAMEDEMETRIES, [name]            ACCOUNT NO.:  [id number]   MEDICAL RECORD NO.:  [id number]          PATIENT TYPE:  INP   LOCATION:  1535                         FACILITY:  [facility]   PHYSICIAN:  Sandria Bales. Ezzard Standing, M.D.  DATE OF BIRTH:  1955-07-28   DATE OF ADMISSION:  07/28/2007  DATE OF DISCHARGE:  07/31/2007                               DISCHARGE SUMMARY   Why can't the date of discharge be placed here?   DISCHARGE DIAGNOSES:  1. Morbid obesity with a weight of 362, a body mass index of 50.6.  2. Sleep apnea on continuous positive airway pressure.  3. Gastroesophageal reflux.  4. Arthritis of back and shoulders.  5. Insulin-dependent diabetes mellitus.  6. History of depression.  7. Was still smoking cigarettes at the time of surgery though that was      unknown to me.  The date of surgery he talked to me about this      after the operation.   OPERATION PERFORMED:  The patient had a laparoscopic Roux-en-Y gastric  bypass and upper endoscopy on July 28, 2007.   HISTORY OF ILLNESS:  [name] is a 55 year old, black male who is a  patient of Dr. Sharlot Gowda, who says he has been overweight since about  first grade.  He has had multiple medical problems associated with his  weight including: sleep apnea on CPAP, gastroesophageal reflux disease,  arthritis of his back and shoulders, insulin-dependent diabetes  mellitus.  He has also seen Dr. Cyndia Skeeters in the past for depression.   He has been through our preop bariatric program and is interested in  having a Roux-en-Y gastric bypass.  A thorough discussion was carried  out with the patient about the risks and benefits of bariatric surgery.  He also understands he has obligations about behavior and diet that are  essential for success after baritaric surgery.   The patient now presents to the hospital for bypass.   He was taken to the operating room the day of admission, where underwent  a laparoscopic Roux-en-Y gastric  bypass and upper endoscopy.   He did well with this.   On the first postop day he complained of epigastric pain but he had no  nausea.  His temperature was noted be 99, his pulse was 82. his blood  pressure 142/56, his hemoglobin 12, hematocrit 35, white blood count of  10,800.  His upper GI showed emptying of his gastric pouch without leak  and Dopplers of his lower extremities showed no obvious DVT.  His blood  sugars were stable about 209 and he tolerated well with his CPAP so he  was advanced, started on clear liquids.   On the second postop day he complained of  chest pain which he had early  was better but he did not think he was doing well enough drinking  liquids to go home, so he was advanced to a protein drink.  On the  second postop day his hemoglobin was 11, hematocrit 33, white blood  count of 10,300.  Because of his chest pain we checked some cardiac  functions including troponin  and CK-MB, which were both within normal  range.  His glucose of was 135.   By the third postop day he was feeling much better, tolerating his  liquids, ready for discharge.   His discharge instructions included going on protein shakes.  He was to  see our dietitian within one week's time.  He could shower.  He was  given Roxicet elixir for pain.  He was going to make the appointment to  see Dr. Leslie Dales within 1-2 weeks for follow-up on his diabetes.   He was to resume his home medications, which included:  1. Actos 45 mg daily.  2. Pantoprazole 40 mg daily.  3. Glimepiride 4 mg daily.  4. Fluoxetine 20 mg daily.  5. Budeprion 200 mg twice a day.  6. Metformin 1000 mg daily.  7. Lisinopril 10 mg nightly.  8. Ge [place] ti use Lantus insulin.  9. Byetta 10/10 mg twice a day though, again, he is supposed to check      with Dr. Leslie Dales regarding his diabetes to see how that checks      out.   His discharge condition was good.  There was no pathology.   [Note:  I found out after discharge  the patient was smoking cigarrettes  up until admission.  He was repeatedly warned preoperatively that  smoking is not acceptable behavior for a bypass patient.  He was warned  that there are significant risks, including, but not limited to GI  ulceration, stenosis, and bleeding which are a higher risk in a smoker.  I told him if I had known he was actively smoking, I would have canceled  his surgery.]      Sandria Bales. Ezzard Standing, M.D.  Electronically Signed     DHN/MEDQ  D:  08/09/2007  T:  08/11/2007  Job:  [id number]   cc:   Sharlot Gowda, M.D.  Fax: [phone]   Veverly Fells. Altheimer, M.D.  Fax: [phone]   Vilinda Flake, Ph.D.  Fax: [phone]

## 2010-11-14 NOTE — Op Note (Signed)
[name], [name]            ACCOUNT NO.:  [id number]   MEDICAL RECORD NO.:  [id number]          PATIENT TYPE:  INP   LOCATION:  1240                         FACILITY:  [facility]   PHYSICIAN:  Sandria Bales. Ezzard Standing, M.D.  DATE OF BIRTH:  [birth year]   DATE OF PROCEDURE:  07/28/2007  DATE OF DISCHARGE:                               OPERATIVE REPORT   PREOPERATIVE DIAGNOSES:  Morbid obesity, weight 362, body mass index  50.6.   POSTOPERATIVE DIAGNOSES:  Morbid obesity (weight 362) body mass index  50.6, adhesions of omentum to anterior abdominal wall.   PROCEDURE:  Lysis of anterior wall omental adhesions (spent 30 to 45  minutes), laparoscopic Roux-en-Y gastric bypass, upper endoscopy by Dr.  Wenda Low.   SURGEON:  Dr. Ezzard Standing.   FIRST ASSISTANT:  Dr. Wenda Low.   ANESTHESIA:  General endotracheal with approximately 30 mL of 0.25%  Marcaine with local anesthetic.   COMPLICATIONS:  None.   INDICATIONS FOR PROCEDURE:  [name] is a 55 year old black male who  is a patient [name] who has been through our preop bariatric  program.  He is interested in a laparoscopic Roux-en-Y gastric bypass  and understands the indications and potential risks of surgery.   The potential risks include bleeding, infection, bowel leak, deep venous  thrombosis with PE, and long-term nutritional consequences.   OPERATIVE NOTE:  The patient was placed in the supine position, given a  general endotracheal anesthetic. He had a Foley catheter in place, PAS  stockings in place.  He was given 2 grams of cefoxitin at the initiation  of the procedure. His abdomen was prepped with Betadine solution and  sterilely draped.   A time-out was held identifying the patient and the procedure.   The left upper quadrant was accessed with an Optiview trocar but upon  entering the abdominal cavity, I found myself subomental.  I did not see  any obvious injury to any bowel.  I then placed a second 11 mm  trocar to  the right of the umbilicus and again it went underneath the omentum. Mr.  Owen  had his omentum, probably better than 50% of it, stuck to the  anterior abdominal wall. He had no prior abdominal surgery.  The  etiology of this adhesed omentum was unclear.   I had to end up placing two 5 mm Ethicon trocars in the lower abdomen to  take down the omentum. I probably spent 30-45 minutes taking this  omentum down from the anterior abdominal wall so I could get all my  trocars in place.   Again there seemed to be no bowel involved in these adhesions, this was  just omentum attached to the anterior abdominal wall. I could then see  above the right and left lobes of the liver which were unremarkable. The  anterior wall of the stomach was unremarkable.   I ended up placing a 5-mm subxiphoid trocar for liver retractor and a 12  mm right subcostal trocar, a 12 mm right paramedian trocar, a 12 mm left  paramedian trocar and a 5 mm trocar out far lateral.  This made a total  of 9 trocars.   I first turned my attention to the small bowel. I counted 40 cm from the  ligament of Treitz down the jejunum.  I then divided the jejunum with  two firings of a white load of the Endo 45 Ethicon GIA stapler.   I tagged the future gastric limb with a Penrose drain and I we counted  100 cm down from this to make my future gastric limb and did a side-to-  side jejunojejunostomy.   The jejunojejunostomy was created using a white load of the Endo-GIA  stapler.  I then closed the enterotomy with two running 2-0 Vicryl  sutures and had to put a single additional suture in for the  anastomosis.  I then closed the mesenteric defect with 2-0 silk sutures.   I then placed Tisseel over the jejunojejunostomy.   I then divided the omentum for my future jejunal limb up to the stomach.  I then placed the liver retractor, retracted the left lobe of the liver,  identified the esophagogastric junction. I went  to the left of midline  and identified the angle of Hiss and dissected this area and used the  finger dissector. I then went along the lesser curvature approximately 4-  5 cm down from the GE junction and started my stomach pouch.  I got into  the lesser sac, I then did a single firing of the blue load of the Endo-  GIA 45 stapler.  I then did three firings of the 60 mm Ethicon stapler  and then a final 45-mm and these were all blue loads creating a stomach  pouch approximately 5 cm in length and 3 cm in width.   I then oversewed the distal gastric remnant with a locking 2-0 Vicryl  suture. I then placed Tisseel along the greater curvature of the new  stomach pouch.   I then brought the jejunal limb up antegastric, antecolic and did a  posterior row of the jejunum to the gastric pouch with it left facing  candy cane.  After this was sewn in place,  I then used a blue load of  the Endo-GIA stapler to do a gastrojejunostomy.  I closed this  enterotomy with two running 2-0 Vicryl sutures. Again I put a single 2-0  Vicryl suture, kind of oversewed the far right corner of the anastomosis  and then I did an anterior running 2-0 Vicryl suture.   I did go and for a Peterson's defect and was not happy that I thought I  could close this easily so I did not try to close this.   At this point, Dr. Wenda Low broke scrub and went up and passed an  endoscope down his mouth. I clamped off his jejunum while he did upper  endoscopy. He will dictate this portion of the operation. Basically he  saw the gastroesophageal junction about 42 cm, the anastomosis about 46-  47 cm. The mucosa in the stomach looked good and I flooded the upper  abdomen with saline, there was no bubbling and no evidence of leak.   I irrigated the abdomen with about a liter and a half of saline.  He had  had a little bit of oozing from where this omentum was taken down but I  saw no active bleeding at the end of the case. The  liver retractor was  then removed under direct visualization, the trocars were then removed.  I infiltrated the skin  with about 40 mL of 1% Xylocaine and closed the  skin with a running 5-0 Monocryl suture and then painted each one with  tincture of Benzoin and Steri-Strips.   Mr. Canal tolerated the procedure well and was transported to the  recovery room in good condition.  Sponge and needle counts were correct  at the end of the case.      Sandria Bales. Ezzard Standing, M.D.  Electronically Signed     DHN/MEDQ  D:  07/28/2007  T:  07/28/2007  Job:  [id number]   cc:   Sharlot Owen, M.D.  Fax: [phone]   Veverly Fells. Altheimer, M.D.  Fax: [phone]   Vilinda Flake, Ph.D.  Fax: [phone]

## 2010-11-15 ENCOUNTER — Telehealth: Payer: Self-pay

## 2010-11-15 NOTE — Telephone Encounter (Signed)
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

## 2010-11-15 NOTE — Telephone Encounter (Signed)
Pt # is home [phone] cell [phone] called pt and gave results of labs all ok pt said needs omeprazole refill through [facility]

## 2010-12-05 ENCOUNTER — Ambulatory Visit (INDEPENDENT_AMBULATORY_CARE_PROVIDER_SITE_OTHER): Payer: 59 | Admitting: Family Medicine

## 2010-12-05 ENCOUNTER — Encounter: Payer: Self-pay | Admitting: Family Medicine

## 2010-12-05 VITALS — BP 116/70 | HR 68 | Wt 286.0 lb

## 2010-12-05 DIAGNOSIS — N451 Epididymitis: Secondary | ICD-10-CM

## 2010-12-05 DIAGNOSIS — N453 Epididymo-orchitis: Secondary | ICD-10-CM

## 2010-12-05 MED ORDER — CIPROFLOXACIN HCL 500 MG PO TABS: 500.0000 mg | ORAL_TABLET | Freq: Two times a day (BID) | ORAL | Status: AC

## 2010-12-05 NOTE — Patient Instructions (Signed)
Take all the antibiotic and it or not totally back to normal give me a call

## 2010-12-05 NOTE — Progress Notes (Signed)
  Subjective:    Patient ID: [name], male    DOB: 02/17/1956, 55 y.o.   MRN: [id number]  HPI approximately 4 days ago he noted some left testicular soreness and now notes it on both sides. No discharge, dysuria, recent sexual activity ;no fever, chills, back pain history of trauma   Review of Systems     Objective:   Physical Exam abdominal exam shows no tenderness. Penis normal. Testes show the right to be slightly small and atrophied but the epididymis was tender to palpation. Left testes is also slightly small and soft with the epididymis being slightly tender       Assessment & Plan:  Epididymitis. Cipro for 2 weeks. He may also take Advil or Tylenol for pain. He is to call if not entirely better

## 2011-03-09 ENCOUNTER — Telehealth: Payer: Self-pay | Admitting: Family Medicine

## 2011-03-09 NOTE — Telephone Encounter (Signed)
Make a referral

## 2011-03-12 NOTE — Telephone Encounter (Signed)
appt for pt sept 18 at 10 :40 with dr.luptons

## 2011-03-22 LAB — BASIC METABOLIC PANEL
BUN: 16
CO2: 28
Calcium: 9.5
Chloride: 105
Creatinine, Ser: 1.03
GFR calc Af Amer: >60
GFR calc non Af Amer: >60
Glucose, Bld: 95
Potassium: 4.6
Sodium: 140

## 2011-03-22 LAB — CBC
HCT: 33.8 — ABNORMAL LOW
HCT: 35.9 — ABNORMAL LOW
Hemoglobin: 11.7 — ABNORMAL LOW
Hemoglobin: 12.4 — ABNORMAL LOW
MCHC: 34.4
MCHC: 34.7
MCV: 91.5
MCV: 91.8
Platelets: 269
Platelets: 278
RBC: 3.7 — ABNORMAL LOW
RBC: 3.92 — ABNORMAL LOW
RDW: 13.5
RDW: 14
WBC: 10.3
WBC: 10.8 — ABNORMAL HIGH

## 2011-03-22 LAB — URINALYSIS, ROUTINE W REFLEX MICROSCOPIC
Bilirubin Urine: NEGATIVE
Glucose, UA: NEGATIVE
Hgb urine dipstick: NEGATIVE
Ketones, ur: NEGATIVE
Nitrite: NEGATIVE
Protein, ur: NEGATIVE
Specific Gravity, Urine: 1.023
Urobilinogen, UA: 1
pH: 7

## 2011-03-22 LAB — DIFFERENTIAL
Basophils Absolute: 0
Basophils Absolute: 0.2 — ABNORMAL HIGH
Basophils Relative: 0
Basophils Relative: 2 — ABNORMAL HIGH
Eosinophils Absolute: 0
Eosinophils Absolute: 0.1
Eosinophils Relative: 0
Eosinophils Relative: 1
Lymphocytes Relative: 17
Lymphocytes Relative: 18
Lymphs Abs: 1.8
Lymphs Abs: 1.9
Monocytes Absolute: 0.9
Monocytes Absolute: 0.9
Monocytes Relative: 8
Monocytes Relative: 9
Neutro Abs: 7.4
Neutro Abs: 7.9 — ABNORMAL HIGH
Neutrophils Relative %: 72
Neutrophils Relative %: 73

## 2011-03-22 LAB — CARDIAC PANEL(CRET KIN+CKTOT+MB+TROPI)
CK, MB: 1.6
Relative Index: 0.5
Total CK: 304 — ABNORMAL HIGH
Troponin I: 0.04

## 2011-03-22 LAB — HEMOGLOBIN AND HEMATOCRIT, BLOOD
HCT: 35.1 — ABNORMAL LOW
HCT: 38.7 — ABNORMAL LOW
HCT: 41.5
Hemoglobin: 12.1 — ABNORMAL LOW
Hemoglobin: 13.3
Hemoglobin: 14.2

## 2011-04-18 LAB — COMPREHENSIVE METABOLIC PANEL
ALT: 31
AST: 24
Albumin: 3.7
Alkaline Phosphatase: 52
BUN: 14
CO2: 24
Calcium: 9
Chloride: 107
Creatinine, Ser: 0.74
GFR calc Af Amer: >60
GFR calc non Af Amer: >60
Glucose, Bld: 122 — ABNORMAL HIGH
Potassium: 4.2
Sodium: 137
Total Bilirubin: 0.4
Total Protein: 6.2

## 2011-04-18 LAB — POCT I-STAT 3, ART BLOOD GAS (G3+)
Acid-base deficit: 1
Bicarbonate: 24.1 — ABNORMAL HIGH
O2 Saturation: 87
Operator id: 222701
TCO2: 25
pCO2 arterial: 42.7
pH, Arterial: 7.361
pO2, Arterial: 55 — ABNORMAL LOW

## 2011-04-18 LAB — POCT I-STAT 3, VENOUS BLOOD GAS (G3P V)
Acid-base deficit: 1
Acid-base deficit: 1
Acid-base deficit: 2
Bicarbonate: 23.9
Bicarbonate: 25.1 — ABNORMAL HIGH
Bicarbonate: 25.5 — ABNORMAL HIGH
O2 Saturation: 63
O2 Saturation: 65
O2 Saturation: 66
Operator id: 222701
Operator id: 222701
Operator id: 274661
TCO2: 25
TCO2: 27
TCO2: 27
pCO2, Ven: 46.6
pCO2, Ven: 47.2
pCO2, Ven: 47.4
pH, Ven: 7.319 — ABNORMAL HIGH
pH, Ven: 7.334 — ABNORMAL HIGH
pH, Ven: 7.339 — ABNORMAL HIGH
pO2, Ven: 35
pO2, Ven: 36
pO2, Ven: 37

## 2011-04-18 LAB — POCT CARDIAC MARKERS
CKMB, poc: <1 — ABNORMAL LOW
Myoglobin, poc: 68
Operator id: 151321
Troponin i, poc: <0.05

## 2011-04-18 LAB — I-STAT 8, (EC8 V) (CONVERTED LAB)
Acid-base deficit: 2
BUN: 15
Bicarbonate: 24.2 — ABNORMAL HIGH
Chloride: 106
Glucose, Bld: 124 — ABNORMAL HIGH
HCT: 44
Hemoglobin: 15
Operator id: 151321
Potassium: 4.2
Sodium: 140
TCO2: 26
pCO2, Ven: 44.3 — ABNORMAL LOW
pH, Ven: 7.346 — ABNORMAL HIGH

## 2011-04-18 LAB — CBC
HCT: 41.2
Hemoglobin: 13.8
MCHC: 33.6
MCV: 93.7
Platelets: 242
RBC: 4.39
RDW: 14
WBC: 7.8

## 2011-04-18 LAB — TROPONIN I: Troponin I: 0.03

## 2011-04-18 LAB — DIFFERENTIAL
Basophils Absolute: 0.1
Basophils Relative: 1
Eosinophils Absolute: 0.1
Eosinophils Relative: 2
Lymphocytes Relative: 32
Lymphs Abs: 2.4
Monocytes Absolute: 0.8 — ABNORMAL HIGH
Monocytes Relative: 10
Neutro Abs: 4.3
Neutrophils Relative %: 56

## 2011-04-18 LAB — CK TOTAL AND CKMB (NOT AT ARMC)
CK, MB: 2.3
Relative Index: 0.9
Total CK: 270 — ABNORMAL HIGH

## 2011-04-18 LAB — TSH: TSH: 0.629

## 2011-04-18 LAB — MAGNESIUM: Magnesium: 2

## 2011-04-18 LAB — POCT I-STAT CREATININE
Creatinine, Ser: 0.8
Operator id: 151321

## 2011-04-18 LAB — APTT: aPTT: 38 — ABNORMAL HIGH

## 2011-04-18 LAB — PROTIME-INR
INR: 1
Prothrombin Time: 13.4

## 2011-06-05 ENCOUNTER — Ambulatory Visit: Payer: 59 | Admitting: Family Medicine

## 2011-06-12 ENCOUNTER — Ambulatory Visit: Payer: 59 | Admitting: Family Medicine

## 2011-06-19 ENCOUNTER — Ambulatory Visit (INDEPENDENT_AMBULATORY_CARE_PROVIDER_SITE_OTHER): Payer: 59 | Admitting: Family Medicine

## 2011-06-19 VITALS — BP 132/80 | HR 52 | Wt 296.0 lb

## 2011-06-19 DIAGNOSIS — R209 Unspecified disturbances of skin sensation: Secondary | ICD-10-CM

## 2011-06-19 DIAGNOSIS — R201 Hypoesthesia of skin: Secondary | ICD-10-CM

## 2011-06-19 NOTE — Patient Instructions (Signed)
Pay attention to your wrist position as well as elbow position. Also pay attention to which fingers are involved. Keep in touch with

## 2011-06-19 NOTE — Progress Notes (Signed)
  Subjective:    Patient ID: [name], male    DOB: 1956/06/06, 55 y.o.   MRN: [id number]  HPI He complains of a several month history of numbness in his hands especially when he types or is driving. He states that it is all of his fingers. When demonstrating his position with typing he does keep his wrist in neutral position.   Review of Systems     Objective:   Physical Exam Normal sensation and strength. Tinel and Phalen's test was negative.       Assessment & Plan:  Hypesthesias. Probable carpal tunnel although clinically he is not having enough symptoms. I recommend he keep his wrist in neutral position. Try to be aware of the pins and needles sensation in his fingers to determine which ones are in the house. He will keep in touch with me on this.

## 2011-08-13 ENCOUNTER — Telehealth: Payer: Self-pay | Admitting: Family Medicine

## 2011-08-13 NOTE — Telephone Encounter (Signed)
Called and notified pt. Pt is Scheduled to come in tomorrow @ 3

## 2011-08-13 NOTE — Telephone Encounter (Signed)
Have him set up an appointment to discuss this further

## 2011-08-13 NOTE — Telephone Encounter (Signed)
PT CALLED AND STATED THAT YOU WANTED HIM TO CALL AND GIVE MORE INFO ON SYMPTOMS.  PT STATES THAT NUMBNESS IS MAINLY WHEN HE IS GRIPPING SOMETHING. NUMBNESS  IS LOCATED IN MIDDLE AND LITTLE FINGER AND MIGRATES UP TO ELBOW. PT STATES IT IS IN BOTH HANDS BUT MAINLY IN RIGHT. PT ALSO STATES THAT IS SOMETIMES GOES INTO OTHER FINGERS AS WELL.

## 2011-08-14 ENCOUNTER — Encounter: Payer: Self-pay | Admitting: Family Medicine

## 2011-08-14 ENCOUNTER — Ambulatory Visit (INDEPENDENT_AMBULATORY_CARE_PROVIDER_SITE_OTHER): Payer: 59 | Admitting: Family Medicine

## 2011-08-14 VITALS — BP 116/70 | HR 67 | Ht 72.0 in | Wt 299.0 lb

## 2011-08-14 DIAGNOSIS — G5620 Lesion of ulnar nerve, unspecified upper limb: Secondary | ICD-10-CM

## 2011-08-14 NOTE — Progress Notes (Signed)
  Subjective:    Patient ID: [name], male    DOB: [birth year], 56 y.o.   MRN: [id number]  HPI He returns for a recheck on continued difficulty with tingling sensation in his hands. He demonstrates that with his elbow bent at roughly 90 he does have symptoms in his third fourth and fifth digits.   Review of Systems     Objective:   Physical Exam Normal motion of the elbows and wrists. Strength and sensation normal.       Assessment & Plan:  Probable ulnar neuropathy. Recommend he keep his elbow as straight as possible and if symptoms continue in spite of keeping his elbows at 90 or greater, he will return for probable nerve conduction studies

## 2011-08-14 NOTE — Patient Instructions (Signed)
Keep your elbows between 90 and 180. If you still have symptoms set up an appointment for further evaluation and testing

## 2011-12-10 ENCOUNTER — Telehealth: Payer: Self-pay | Admitting: Family Medicine

## 2011-12-10 NOTE — Telephone Encounter (Signed)
Left message for pt to make appt per jcl

## 2011-12-10 NOTE — Telephone Encounter (Signed)
Needs an appt

## 2011-12-13 ENCOUNTER — Encounter: Payer: Self-pay | Admitting: Family Medicine

## 2011-12-13 ENCOUNTER — Ambulatory Visit (INDEPENDENT_AMBULATORY_CARE_PROVIDER_SITE_OTHER): Payer: 59 | Admitting: Family Medicine

## 2011-12-13 VITALS — BP 122/78 | HR 72 | Wt 295.0 lb

## 2011-12-13 DIAGNOSIS — Z6841 Body Mass Index (BMI) 40.0 and over, adult: Secondary | ICD-10-CM

## 2011-12-13 DIAGNOSIS — G5623 Lesion of ulnar nerve, bilateral upper limbs: Secondary | ICD-10-CM

## 2011-12-13 DIAGNOSIS — G5620 Lesion of ulnar nerve, unspecified upper limb: Secondary | ICD-10-CM

## 2011-12-13 NOTE — Progress Notes (Signed)
  Subjective:    Patient ID: [name], male    DOB: [birth year], 56 y.o.   MRN: [id number]  HPI He is here for consult concerning a several month history of bilateral tingling sensation in his hands. He describes tingling mainly in the fourth and fifth fingers. He states that it usually bothers him when he bends his elbow. He also can notice when he is doing typing. He has a previous history of gastric bypass and review of  his record indicates weight gain. He does admit to stress eating.   Review of Systems     Objective:   Physical Exam Alert and in no distress. Weight is recorded and shows an elevation of close to 50 pounds. Normal sensation and strength of his hands. Flexion of both elbows did cause a recurrence of his symptoms. Tinel and Phalen's at the wrist was negative.       Assessment & Plan:   1. Ulnar neuropathy of both upper extremities   2. Morbid obesity with BMI of 40.0-44.9, adult    I explained to him the problem with the ulnar neuropathy especially with elbow position. Also recommend he keep track of wrist position to see if there might be a component of median neuropathy. Discussed keeping his wrist in the neutral position as well as not bending his elbow less than 90. I also discussed the weight with him. He now realizes that his real problem is psychological in that he eats when he gets stressed. He admits to having low self-esteem due to his weight, sexual preferences and his race. Strongly encouraged him to continue in counseling. 30 minutes spent discussing all these issues with him.

## 2011-12-13 NOTE — Patient Instructions (Addendum)
Keep track of your wrist and elbow position in terms of the tingling sensation. Keep her elbow no greater than 90 and make sure your wrist is neutral. Keep working with Lyanne [facility]

## 2012-02-24 ENCOUNTER — Other Ambulatory Visit: Payer: Self-pay | Admitting: Family Medicine

## 2012-11-10 ENCOUNTER — Telehealth: Payer: Self-pay | Admitting: Family Medicine

## 2012-11-10 MED ORDER — AZELASTINE HCL 0.1 % NA SOLN: 1.0000 | NASAL | Status: DC

## 2012-11-10 NOTE — Telephone Encounter (Signed)
Ok per [employer].

## 2012-11-18 ENCOUNTER — Encounter: Payer: Self-pay | Admitting: Family Medicine

## 2012-11-18 ENCOUNTER — Ambulatory Visit (INDEPENDENT_AMBULATORY_CARE_PROVIDER_SITE_OTHER): Payer: 59 | Admitting: Family Medicine

## 2012-11-18 VITALS — BP 118/70 | HR 62 | Temp 97.9°F | Resp 16 | Wt 286.0 lb

## 2012-11-18 DIAGNOSIS — G5600 Carpal tunnel syndrome, unspecified upper limb: Secondary | ICD-10-CM

## 2012-11-18 DIAGNOSIS — M79609 Pain in unspecified limb: Secondary | ICD-10-CM

## 2012-11-18 DIAGNOSIS — M79675 Pain in left toe(s): Secondary | ICD-10-CM

## 2012-11-18 NOTE — Patient Instructions (Signed)
Use wrist splints especially at night and you can use them during the day if you need to. Try to keep your hand in a neutral position. If this doesn't work then let me know and we will get some nerve conduction studies

## 2012-11-18 NOTE — Progress Notes (Signed)
  Subjective:    Patient ID: [name], male    DOB: [birth year], 57 y.o.   MRN: [id number]  HPI He is here for 3 issues. He has right arm pain, bilateral hand pain and left great toe discomfort. He notes that the right arm pain bothers him at night and he also notes that he has difficulty with the pain when he is using his hands. He does not note difficulty on the left side. The right arm pain has been going approximately one month however the hands have been bothering him for 2 months. He has a previous history of similar symptoms. He notes that the pain is in the median nerve distribution and he also notes when he hangs his hands at his side the hand and right shoulder pain goes away. He also complains of a several month history of left great toe pain but no redness, swelling or tenderness. No other joints are involved.   Review of Systems     Objective:   Physical Exam Full motion of the shoulder without pain. Bilateral Tinel's and Phalen's test was negative. Normal sensation. Left great toe does show good motion with no redness swelling or tenderness to palpation       Assessment & Plan:  CTS (carpal tunnel syndrome), unspecified laterality  Great toe pain, left recommend using wrist splints especially at night and also demonstrated neutral position of the wrist especially during the day. He cannot see his splints during the day if he needs to. If he continues to have difficulty, further nerve conduction studies will need to be done. X-ray of his toe will be done. Doubt gout but certainly a possibility

## 2012-11-21 ENCOUNTER — Ambulatory Visit
Admission: RE | Admit: 2012-11-21 | Discharge: 2012-11-21 | Disposition: A | Discharge status: Home / Self Care | Payer: 59 | Source: Ambulatory Visit | Attending: Family Medicine | Admitting: Family Medicine

## 2012-11-21 DIAGNOSIS — M79675 Pain in left toe(s): Secondary | ICD-10-CM

## 2012-11-21 NOTE — Progress Notes (Signed)
Quick Note:  Left message on pt home # word for word He has arthritis of the toe. Explained that it is not gout. Supportive care including using arch supports and firmer soled shoes. ______

## 2012-12-22 ENCOUNTER — Ambulatory Visit (INDEPENDENT_AMBULATORY_CARE_PROVIDER_SITE_OTHER): Payer: 59 | Admitting: Family Medicine

## 2012-12-22 ENCOUNTER — Emergency Department (HOSPITAL_COMMUNITY): Payer: 59

## 2012-12-22 ENCOUNTER — Emergency Department (HOSPITAL_COMMUNITY)
Admission: EM | Admit: 2012-12-22 | Discharge: 2012-12-22 | Disposition: A | Discharge status: Home / Self Care | Payer: 59 | Attending: Emergency Medicine | Admitting: Emergency Medicine

## 2012-12-22 ENCOUNTER — Encounter: Payer: Self-pay | Admitting: Family Medicine

## 2012-12-22 VITALS — BP 100/60 | HR 90 | Temp 102.7°F | Wt 281.0 lb

## 2012-12-22 DIAGNOSIS — Z7982 Long term (current) use of aspirin: Secondary | ICD-10-CM | POA: Insufficient documentation

## 2012-12-22 DIAGNOSIS — R1031 Right lower quadrant pain: Secondary | ICD-10-CM

## 2012-12-22 DIAGNOSIS — I10 Essential (primary) hypertension: Secondary | ICD-10-CM | POA: Insufficient documentation

## 2012-12-22 DIAGNOSIS — Z862 Personal history of diseases of the blood and blood-forming organs and certain disorders involving the immune mechanism: Secondary | ICD-10-CM | POA: Insufficient documentation

## 2012-12-22 DIAGNOSIS — Z87891 Personal history of nicotine dependence: Secondary | ICD-10-CM | POA: Insufficient documentation

## 2012-12-22 DIAGNOSIS — Z9884 Bariatric surgery status: Secondary | ICD-10-CM | POA: Insufficient documentation

## 2012-12-22 DIAGNOSIS — R195 Other fecal abnormalities: Secondary | ICD-10-CM

## 2012-12-22 DIAGNOSIS — R197 Diarrhea, unspecified: Secondary | ICD-10-CM | POA: Insufficient documentation

## 2012-12-22 DIAGNOSIS — I2510 Atherosclerotic heart disease of native coronary artery without angina pectoris: Secondary | ICD-10-CM | POA: Insufficient documentation

## 2012-12-22 DIAGNOSIS — Z794 Long term (current) use of insulin: Secondary | ICD-10-CM | POA: Insufficient documentation

## 2012-12-22 DIAGNOSIS — Z951 Presence of aortocoronary bypass graft: Secondary | ICD-10-CM | POA: Insufficient documentation

## 2012-12-22 DIAGNOSIS — R509 Fever, unspecified: Secondary | ICD-10-CM

## 2012-12-22 DIAGNOSIS — Z8659 Personal history of other mental and behavioral disorders: Secondary | ICD-10-CM | POA: Insufficient documentation

## 2012-12-22 DIAGNOSIS — Z8639 Personal history of other endocrine, nutritional and metabolic disease: Secondary | ICD-10-CM | POA: Insufficient documentation

## 2012-12-22 DIAGNOSIS — Z79899 Other long term (current) drug therapy: Secondary | ICD-10-CM | POA: Insufficient documentation

## 2012-12-22 DIAGNOSIS — E119 Type 2 diabetes mellitus without complications: Secondary | ICD-10-CM | POA: Insufficient documentation

## 2012-12-22 DIAGNOSIS — E785 Hyperlipidemia, unspecified: Secondary | ICD-10-CM | POA: Insufficient documentation

## 2012-12-22 DIAGNOSIS — Z8669 Personal history of other diseases of the nervous system and sense organs: Secondary | ICD-10-CM | POA: Insufficient documentation

## 2012-12-22 DIAGNOSIS — Z8709 Personal history of other diseases of the respiratory system: Secondary | ICD-10-CM | POA: Insufficient documentation

## 2012-12-22 LAB — COMPREHENSIVE METABOLIC PANEL
ALT: 13 U/L (ref 0–53)
AST: 20 U/L (ref 0–37)
Albumin: 3.5 g/dL (ref 3.5–5.2)
Alkaline Phosphatase: 70 U/L (ref 39–117)
BUN: 11 mg/dL (ref 6–23)
CO2: 23 mEq/L (ref 19–32)
Calcium: 8.9 mg/dL (ref 8.4–10.5)
Chloride: 98 mEq/L (ref 96–112)
Creatinine, Ser: 0.96 mg/dL (ref 0.50–1.35)
GFR calc Af Amer: >90 mL/min (ref >90)
GFR calc non Af Amer: >90 mL/min (ref >90)
Glucose, Bld: 161 mg/dL — ABNORMAL HIGH (ref 70–99)
Potassium: 3.7 mEq/L (ref 3.5–5.1)
Sodium: 133 mEq/L — ABNORMAL LOW (ref 135–145)
Total Bilirubin: 0.3 mg/dL (ref 0.3–1.2)
Total Protein: 7.2 g/dL (ref 6.0–8.3)

## 2012-12-22 LAB — URINALYSIS, ROUTINE W REFLEX MICROSCOPIC
Bilirubin Urine: NEGATIVE
Glucose, UA: 100 mg/dL — AB
Hgb urine dipstick: NEGATIVE
Ketones, ur: 15 mg/dL — AB
Leukocytes, UA: NEGATIVE
Nitrite: NEGATIVE
Protein, ur: NEGATIVE mg/dL
Specific Gravity, Urine: 1.044 — ABNORMAL HIGH (ref 1.005–1.030)
Urobilinogen, UA: 0.2 mg/dL (ref 0.0–1.0)
pH: 5.5 (ref 5.0–8.0)

## 2012-12-22 LAB — CBC WITH DIFFERENTIAL/PLATELET
Basophils Absolute: 0 10*3/uL (ref 0.0–0.1)
Basophils Relative: 0 % (ref 0–1)
Eosinophils Absolute: 0 10*3/uL (ref 0.0–0.7)
Eosinophils Relative: 0 % (ref 0–5)
HCT: 33.4 % — ABNORMAL LOW (ref 39.0–52.0)
Hemoglobin: 10.5 g/dL — ABNORMAL LOW (ref 13.0–17.0)
Lymphocytes Relative: 14 % (ref 12–46)
Lymphs Abs: 1.7 10*3/uL (ref 0.7–4.0)
MCH: 22.6 pg — ABNORMAL LOW (ref 26.0–34.0)
MCHC: 31.4 g/dL (ref 30.0–36.0)
MCV: 71.8 fL — ABNORMAL LOW (ref 78.0–100.0)
Monocytes Absolute: 1.1 10*3/uL — ABNORMAL HIGH (ref 0.1–1.0)
Monocytes Relative: 9 % (ref 3–12)
Neutro Abs: 9.1 10*3/uL — ABNORMAL HIGH (ref 1.7–7.7)
Neutrophils Relative %: 76 % (ref 43–77)
Platelets: 285 10*3/uL (ref 150–400)
RBC: 4.65 MIL/uL (ref 4.22–5.81)
RDW: 17.1 % — ABNORMAL HIGH (ref 11.5–15.5)
WBC: 11.9 10*3/uL — ABNORMAL HIGH (ref 4.0–10.5)

## 2012-12-22 LAB — LIPASE, BLOOD: Lipase: 71 U/L — ABNORMAL HIGH (ref 11–59)

## 2012-12-22 MED ORDER — ONDANSETRON HCL 4 MG/2ML IJ SOLN
4.0000 mg | Freq: Once | INTRAMUSCULAR | Status: AC
Start: 2012-12-22 — End: 2012-12-22
  Administered 2012-12-22: 4 mg via INTRAVENOUS
  Filled 2012-12-22: qty 2

## 2012-12-22 MED ORDER — ACETAMINOPHEN 325 MG PO TABS
650.0000 mg | ORAL_TABLET | Freq: Once | ORAL | Status: AC
  Administered 2012-12-22: 650 mg via ORAL
  Filled 2012-12-22: qty 2

## 2012-12-22 MED ORDER — OXYCODONE-ACETAMINOPHEN 5-325 MG PO TABS: 1.0000 | ORAL_TABLET | Freq: Four times a day (QID) | ORAL | Status: DC | PRN

## 2012-12-22 MED ORDER — MORPHINE SULFATE 4 MG/ML IJ SOLN
4.0000 mg | Freq: Once | INTRAMUSCULAR | Status: AC
  Administered 2012-12-22: 4 mg via INTRAVENOUS
  Filled 2012-12-22: qty 1

## 2012-12-22 MED ORDER — IOHEXOL 300 MG/ML  SOLN
100.0000 mL | Freq: Once | INTRAMUSCULAR | Status: AC | PRN
  Administered 2012-12-22: 100 mL via INTRAVENOUS

## 2012-12-22 MED ORDER — IOHEXOL 300 MG/ML  SOLN
50.0000 mL | Freq: Once | INTRAMUSCULAR | Status: AC | PRN
  Administered 2012-12-22: 50 mL via ORAL

## 2012-12-22 MED ORDER — SODIUM CHLORIDE 0.9 % IV BOLUS (SEPSIS)
1000.0000 mL | Freq: Once | INTRAVENOUS | Status: AC
  Administered 2012-12-22: 1000 mL via INTRAVENOUS

## 2012-12-22 MED ORDER — ONDANSETRON HCL 4 MG PO TABS: 4.0000 mg | ORAL_TABLET | Freq: Four times a day (QID) | ORAL | Status: DC

## 2012-12-22 NOTE — ED Provider Notes (Signed)
History     CSN: [id number]  Arrival date & time 12/22/12  1500   First MD Initiated Contact with Patient 12/22/12 1515      Chief Complaint  Patient presents with  . Fever  . Diarrhea    (Consider location/radiation/quality/duration/timing/severity/associated sxs/prior treatment) HPI  [name] is a 57 y.o.male with a PMH of DM, hypertension, gastric bypass, obesity, CAD and s/p CABG  presents to the ER with complaints of RLQ pain, fever, and diarrhea since Friday that has been progressively getting worse. He has had a fever greater than 102 at home and has been taking tylenol for his fever. He denies a foul odor to his diarrhea. Denies confusions, weakness, dysuria, nausea, vomiting, headache, SOB, CP.    Past Medical History  Diagnosis Date  . Diabetes mellitus   . Hypertension   . Obesity   . Allergic rhinitis   . Dyslipidemia   . Passive-dependent personality disorder   . Smoker quit 07-28-07  . Family history of cardiovascular disease   . Sleep apnea   . ETD (eustachian tube dysfunction)     chronic  . Sinusitis   . CAD (coronary artery disease)   . S/P CABG x 1     Past Surgical History  Procedure Laterality Date  . Left foot surgery    . Roux-en-y procedure  2009    Family History  Problem Relation Age of Onset  . Heart disease Mother   . Cancer Mother 78    Multiple myeloma  . Diabetes Father   . Diabetes Brother   . Heart disease Brother 73    cardiomyopathy  . Arthritis Paternal Grandmother   . Diabetes Paternal Grandmother   . Heart disease Paternal Grandmother   . Stroke Paternal Grandmother   . Heart disease Brother 37    MI    History  Substance Use Topics  . Smoking status: Former Smoker    Types: Cigarettes    Quit date: 07/03/2007  . Smokeless tobacco: Never Used  . Alcohol Use: No      Review of Systems  Constitutional: Positive for fever.  Gastrointestinal: Positive for abdominal pain and diarrhea. Negative for  nausea and vomiting.     Allergies  Review of patient's allergies indicates no known allergies.  Home Medications   Current Outpatient Rx  Name  Route  Sig  Dispense  Refill  . acetaminophen (TYLENOL) 500 MG tablet   Oral   Take 1,000 mg by mouth every 6 (six) hours as needed for pain.         Marland Kitchen aspirin EC 81 MG tablet   Oral   Take 81 mg by mouth at bedtime.         Marland Kitchen azelastine (ASTELIN) 137 MCG/SPRAY nasal spray   Each Nare   Place 1 spray into both nostrils at bedtime.         . B Complex-C (B-COMPLEX WITH VITAMIN C) tablet   Oral   Take 1 tablet by mouth 2 (two) times daily.         Marland Kitchen buPROPion (WELLBUTRIN SR) 200 MG 12 hr tablet   Oral   Take 200 mg by mouth 2 (two) times daily.           . calcium-vitamin D 250-100 MG-UNIT per tablet   Oral   Take 1 tablet by mouth 2 (two) times daily.           . Canagliflozin (INVOKANA) 300 MG TABS  Oral   Take 150 mg by mouth daily.         . cetirizine (ZYRTEC) 10 MG tablet   Oral   Take 10 mg by mouth daily.           . insulin glargine (LANTUS) 100 UNIT/ML injection   Subcutaneous   Inject 50 Units into the skin at bedtime.          Marland Kitchen lisinopril (PRINIVIL,ZESTRIL) 10 MG tablet   Oral   Take 10 mg by mouth daily.           Marland Kitchen loperamide (IMODIUM) 2 MG capsule   Oral   Take 2 mg by mouth 4 (four) times daily as needed for diarrhea or loose stools.         . metFORMIN (GLUCOPHAGE) 1000 MG tablet   Oral   Take 1,000 mg by mouth 2 (two) times daily with a meal.           . Multiple Vitamins-Minerals (MULTIVITAMIN WITH MINERALS) tablet   Oral   Take 1 tablet by mouth 2 (two) times daily.           Marland Kitchen omeprazole (PRILOSEC) 40 MG capsule   Oral   Take 40 mg by mouth daily.         Marland Kitchen SIMVASTATIN PO   Oral   Take 1 tablet by mouth at bedtime.         . ondansetron (ZOFRAN) 4 MG tablet   Oral   Take 1 tablet (4 mg total) by mouth every 6 (six) hours.   12 tablet   0   .  oxyCODONE-acetaminophen (PERCOCET/ROXICET) 5-325 MG per tablet   Oral   Take 1 tablet by mouth every 6 (six) hours as needed for pain.   20 tablet   0     BP 115/60  Pulse 83  Temp(Src) 102.5 F (39.2 C) (Oral)  Resp 16  SpO2 95%  Physical Exam  Nursing note and vitals reviewed. Constitutional: He appears well-developed and well-nourished. No distress.  HENT:  Head: Normocephalic and atraumatic.  Eyes: Pupils are equal, round, and reactive to light.  Neck: Normal range of motion. Neck supple.  Cardiovascular: Normal rate and regular rhythm.   Pulmonary/Chest: Effort normal.  Abdominal: Soft. Bowel sounds are normal. There is tenderness in the right lower quadrant. There is no rigidity, no rebound, no guarding, no CVA tenderness, no tenderness at McBurney's point and negative Murphy's sign.    Neurological: He is alert.  Skin: Skin is warm and dry.    ED Course  Procedures (including critical care time)  Labs Reviewed  CBC WITH DIFFERENTIAL - Abnormal; Notable for the following:    WBC 11.9 (*)    Hemoglobin 10.5 (*)    HCT 33.4 (*)    MCV 71.8 (*)    MCH 22.6 (*)    RDW 17.1 (*)    Neutro Abs 9.1 (*)    Monocytes Absolute 1.1 (*)    All other components within normal limits  COMPREHENSIVE METABOLIC PANEL - Abnormal; Notable for the following:    Sodium 133 (*)    Glucose, Bld 161 (*)    All other components within normal limits  LIPASE, BLOOD - Abnormal; Notable for the following:    Lipase 71 (*)    All other components within normal limits  URINALYSIS, ROUTINE W REFLEX MICROSCOPIC - Abnormal; Notable for the following:    Specific Gravity, Urine 1.044 (*)    Glucose,  UA 100 (*)    Ketones, ur 15 (*)    All other components within normal limits  URINE CULTURE  CLOSTRIDIUM DIFFICILE BY PCR  STOOL CULTURE   Ct Abdomen Pelvis W Contrast  12/22/2012   *RADIOLOGY REPORT*  Clinical Data: 57 year old male with fever diarrhea right lower quadrant pain.  Question  appendicitis.  CT ABDOMEN AND PELVIS WITH CONTRAST  Technique:  Multidetector CT imaging of the abdomen and pelvis was performed following the standard protocol during bolus administration of intravenous contrast.  Contrast: OMNIPAQUE IOHEXOL 300 MG/ML  SOLN, 50mL OMNIPAQUE IOHEXOL 300 MG/ML  SOLN  Comparison: CT abdomen and pelvis  12/17/2007.  Findings: Negative lung bases.  Lower lumbar advanced disc degeneration with vacuum disc phenomena. No acute osseous abnormality identified.  No pelvic free fluid.  Distal colon is within normal limits.  Left colon and transverse colon are within normal limits.  Fluid in the right colon.  Normal appendix, mostly containing gas. There may be mild right colon mesenteric stranding.  The terminal ileum also appears mildly indistinct.  There is no upstream dilated small bowel.  Sequelae of Roux-en-Y type gastric bypass.  Oral contrast has reached the mid small bowel.  Decreased liver density in keeping with steatosis.  Negative gallbladder, spleen, pancreas, and adrenal glands.  Bilateral perinephric stranding.  Delayed renal images demonstrate symmetric appearance of contrast excretion.  No hydronephrosis or hydroureter.  Negative course of both ureters.  Unremarkable bladder.  Small parapelvic renal cysts are not significantly changed.  No abdominal free fluid.  No lymphadenopathy.  Suboptimal intravascular contrast bolus.  The main portal vein appears patent.  Calcified atherosclerosis of the aorta and iliac arteries.  No lymphadenopathy identified.  IMPRESSION: 1.  Normal appendix. 2.  Indistinct appearance of the ascending colon may reflect a mild proximal colitis. 3.  Bilateral pararenal stranding.  Query acute renal insufficiency.  If the patient primarily has flank pain rather than abdominal pain, consider the possibility of pyelonephritis. 4.  No other acute findings.  Hepatic steatosis.  Sequelae of Roux- en-Y type gastric bypass.   Original Report Authenticated  By: Erskine Speed, M.D.     1. Diarrhea   2. Fever       MDM  Discussed case with Dr. Marina Goodell- he recommends symptomatic treatment at this time. Will order stool cultures and c.dif culture and treat if it returns positive. Will give referral to GI.  CT scan suggests colitis. Possible pyelo but no UTI will send out urine culture.  Discussed case with Dr. Radford Pax. Rx Vicodin for pain.  Stool cultures obtained. Discussed plan/results with patients. He is understanding and agreeable.  57 y.o.Suzan Nailer Nylander's evaluation in the Emergency Department is complete. It has been determined that no acute conditions requiring further emergency intervention are present at this time. The patient/guardian have been advised of the diagnosis and plan. We have discussed signs and symptoms that warrant return to the ED, such as changes or worsening in symptoms.  Vital signs are stable at discharge. Filed Vitals:   12/22/12 1938  BP: 115/60  Pulse: 83  Temp: 102.5 F (39.2 C)  Resp: 16    Patient/guardian has voiced understanding and agreed to follow-up with the PCP or specialist.         Dorthula Matas, PA-C 12/22/12 2019

## 2012-12-22 NOTE — ED Notes (Signed)
Pt states that he has had fever, diarrhea and RLQ pain for 2 days. Came from PCP who stated that he tested positive for hemoccult test and he was sent in to r/o APPY.

## 2012-12-22 NOTE — Progress Notes (Signed)
  Subjective:    Patient ID: [name], male    DOB: July 03, 1955, 57 y.o.   MRN: [id number]  HPI He has a three-day history of fever, chills, diarrhea with myalgias, dizziness but no vomiting. He now is complaining of right lower cautery pain. He has a previous history of Roux-en-Y surgery. The diarrhea is loose and watery but not foul-smelling or bloody. He has not traveled anywhere and has not been in the mountains or drink from well water He states that his eating habits have not changed.   Review of Systems     Objective:   Physical Exam alert and toxic appearing, warm to touch. Tympanic membranes and canals are normal. Throat is clear. Tonsils are normal. Neck is supple without adenopathy or thyromegaly. Cardiac exam shows a regular sinus rhythm without murmurs or gallops. Lungs are clear to auscultation. Abdominal exam shows decreased bowel sounds. He has right lower cautery pain with rebound and referred pain. Genital exam normal. Rectal exam showed a nontender prostate and guaiac positive stool       Assessment & Plan:  RLQ abdominal pain - Plan: Hemoccult - 1 Card (office)  Guaiac positive stools - Plan: Hemoccult - 1 Card (office)  Diarrhea  case was discussed with Dr. Donell Beers who recommended sending him to the emergency room for workup.

## 2012-12-23 ENCOUNTER — Encounter: Payer: Self-pay | Admitting: Gastroenterology

## 2012-12-23 ENCOUNTER — Ambulatory Visit (INDEPENDENT_AMBULATORY_CARE_PROVIDER_SITE_OTHER): Payer: 59 | Admitting: Gastroenterology

## 2012-12-23 ENCOUNTER — Telehealth: Payer: Self-pay

## 2012-12-23 ENCOUNTER — Telehealth: Payer: Self-pay | Admitting: Gastroenterology

## 2012-12-23 VITALS — BP 110/60 | HR 77 | Temp 100.3°F | Ht 71.0 in | Wt 282.0 lb

## 2012-12-23 DIAGNOSIS — R9389 Abnormal findings on diagnostic imaging of other specified body structures: Secondary | ICD-10-CM | POA: Insufficient documentation

## 2012-12-23 DIAGNOSIS — R197 Diarrhea, unspecified: Secondary | ICD-10-CM

## 2012-12-23 DIAGNOSIS — R1031 Right lower quadrant pain: Secondary | ICD-10-CM | POA: Insufficient documentation

## 2012-12-23 LAB — CLOSTRIDIUM DIFFICILE BY PCR: Toxigenic C. Difficile by PCR: NEGATIVE

## 2012-12-23 MED ORDER — METRONIDAZOLE 250 MG PO TABS: 500.0000 mg | ORAL_TABLET | Freq: Three times a day (TID) | ORAL | Status: DC

## 2012-12-23 MED ORDER — CIPROFLOXACIN HCL 500 MG PO TABS: 500.0000 mg | ORAL_TABLET | Freq: Two times a day (BID) | ORAL | Status: DC

## 2012-12-23 NOTE — Patient Instructions (Addendum)
We have sent the following medications to your pharmacy for you to pick up at your convenience: Cipro and Flagyl Please follow up with Dr Susann Givens as needed.  CC:  Dr Susann Givens

## 2012-12-23 NOTE — Progress Notes (Signed)
Reviewed and agree with management plan.  Ardenia Stiner T. Linzi Ohlinger, MD FACG 

## 2012-12-23 NOTE — Progress Notes (Signed)
12/23/2012 [name] [id number] [birth year]   HISTORY OF PRESENT ILLNESS:  Patient is a pleasant 57 year old black male who presents to our office today after an ER visit.  He was seen in the ED yesterday for acute onset of diarrhea, abdominal pain, body aches, fevers, and chills that began on Saturday.  He said that initially he had pain all over his body, but then developed abdominal pain, particularly in RLQ.  Prior to the onset of these symptoms he was feeling well; does not recall having any similar symptoms in the past.  Denies urinary symptoms.  No recent travel or sick contacts.  He saw his PCP in the office yesterday, however, he recommended that he go to the ED to be evaluated.  CT scan showed possibility of mild colitis in ascending colon and also B/L perinephric stranding.  Elevated WBC count to 11.9.  Initial urinalysis negative but culture pending, and patient denies urinary symptoms.  Cdiff negative and stool culture preliminarily negative.  He was given pain medication and told to follow-up with GI.  Still has low-grade temp at today's visit at 100.3.  Says that he is drinking liquids and staying hydrated.  Still having diarrhea several times per day, but has not experienced any nausea or vomiting.  Colonoscopy 07/2008 was completely normal with repeat recommended in 10 years from that time.   Past Medical History  Diagnosis Date  . Diabetes mellitus   . Hypertension   . Obesity   . Allergic rhinitis   . Dyslipidemia   . Passive-dependent personality disorder   . Smoker quit 07-28-07  . Family history of cardiovascular disease   . Sleep apnea   . ETD (eustachian tube dysfunction)     chronic  . Sinusitis   . CAD (coronary artery disease)   . S/P CABG x 1    Past Surgical History  Procedure Laterality Date  . Left foot surgery    . Roux-en-y procedure  2009    reports that he quit smoking about 5 years ago. His smoking use included Cigarettes. He smoked 0.00 packs  per day. He has never used smokeless tobacco. He reports that he does not drink alcohol or use illicit drugs. family history includes Arthritis in his paternal grandmother; Cancer (age of onset: 20) in his mother; Diabetes in his brother, father, and paternal grandmother; Heart disease in his mother and paternal grandmother; Heart disease (age of onset: 78) in his brother; Heart disease (age of onset: 89) in his brother; and Stroke in his paternal grandmother. No Known Allergies    Outpatient Encounter Prescriptions as of 12/23/2012  Medication Sig Dispense Refill  . acetaminophen (TYLENOL) 500 MG tablet Take 1,000 mg by mouth every 6 (six) hours as needed for pain.      Marland Kitchen aspirin EC 81 MG tablet Take 81 mg by mouth at bedtime.      Marland Kitchen azelastine (ASTELIN) 137 MCG/SPRAY nasal spray Place 1 spray into both nostrils at bedtime.      . B Complex-C (B-COMPLEX WITH VITAMIN C) tablet Take 1 tablet by mouth 2 (two) times daily.      Marland Kitchen buPROPion (WELLBUTRIN SR) 200 MG 12 hr tablet Take 200 mg by mouth 2 (two) times daily.        . calcium-vitamin D 250-100 MG-UNIT per tablet Take 1 tablet by mouth 2 (two) times daily.        . Canagliflozin (INVOKANA) 300 MG TABS Take 150 mg by mouth daily.      Marland Kitchen  cetirizine (ZYRTEC) 10 MG tablet Take 10 mg by mouth daily.        . insulin glargine (LANTUS) 100 UNIT/ML injection Inject 50 Units into the skin at bedtime.       Marland Kitchen lisinopril (PRINIVIL,ZESTRIL) 10 MG tablet Take 10 mg by mouth daily.        Marland Kitchen loperamide (IMODIUM) 2 MG capsule Take 2 mg by mouth 4 (four) times daily as needed for diarrhea or loose stools.      . metFORMIN (GLUCOPHAGE) 1000 MG tablet Take 1,000 mg by mouth 2 (two) times daily with a meal.        . Multiple Vitamins-Minerals (MULTIVITAMIN WITH MINERALS) tablet Take 1 tablet by mouth 2 (two) times daily.        Marland Kitchen omeprazole (PRILOSEC) 40 MG capsule Take 40 mg by mouth daily.      . ondansetron (ZOFRAN) 4 MG tablet Take 1 tablet (4 mg total) by  mouth every 6 (six) hours.  12 tablet  0  . oxyCODONE-acetaminophen (PERCOCET/ROXICET) 5-325 MG per tablet Take 1 tablet by mouth every 6 (six) hours as needed for pain.  20 tablet  0  . SIMVASTATIN PO Take 1 tablet by mouth at bedtime.       No facility-administered encounter medications on file as of 12/23/2012.     REVIEW OF SYSTEMS  : All other systems reviewed and negative except where noted in the History of Present Illness.   PHYSICAL EXAM: BP 110/60  Pulse 77  Temp(Src) 100.3 F (37.9 C) (Oral)  Ht 5\' 11"  (1.803 m)  Wt 282 lb (127.914 kg)  BMI 39.35 kg/m2  SpO2 98% General: Well developed black male in no acute distress Head: Normocephalic and atraumatic Eyes:  sclerae anicteric,conjunctive pink. Ears: Normal auditory acuity Lungs: Clear throughout to auscultation Heart: Regular rate and rhythm Abdomen: Soft, obese, non-distended. No masses or hepatomegaly noted. Normal bowel sounds.  RLQ TTP without R/R/G. Musculoskeletal: Symmetrical with no gross deformities  Skin: No lesions on visible extremities Extremities: No edema  Neurological: Alert oriented x 4, grossly nonfocal Psychological:  Alert and cooperative. Normal mood and affect  ASSESSMENT AND PLAN: -Sudden onset of diarrhea, abdominal pain, body aches, fevers, chills in a 57 year old male.  CT scan showed possibility of mild colitis in ascending colon and also B/L perinephric stranding.  Elevated WBC count.  Initial urinalysis negative but culture pending, and patient denies urinary symptoms.  Cdiff negative and stool culture preliminarily negative.  We will give him 10 day course of antibiotics, both cipro 500 mg BID and flagyl 500 mg TID to cover both infectious colitis of some sort as well as possible urinary source of infection.  He will call us with an update in a few days and should follow-up with his PCP (? Need for follow-up renal ultrasound).  Instructed patient to go to the ED if symptoms worsen or if he  remains without improvement.

## 2012-12-23 NOTE — Telephone Encounter (Signed)
PT IS AWARE OF HIS APPT. AT [place] 2:15

## 2012-12-23 NOTE — Telephone Encounter (Signed)
I have left a message for Cordelia Pen to have the patient here today at 2:15 to see Doug Sou, PA

## 2012-12-24 LAB — URINE CULTURE
Colony Count: NO GROWTH
Culture: NO GROWTH

## 2012-12-25 NOTE — ED Provider Notes (Signed)
Medical screening examination/treatment/procedure(s) were performed by non-physician practitioner and as supervising physician I was immediately available for consultation/collaboration.   Jahmiyah Dullea L Alizae Bechtel, MD 12/25/12 1932 

## 2012-12-26 ENCOUNTER — Telehealth: Payer: Self-pay | Admitting: Family Medicine

## 2012-12-26 NOTE — Telephone Encounter (Signed)
[name] called to say thank you and that he is feeling better. He said th hospital got him in in 15 minutes but it just took them 8 hours after that. He wanted to say Thank you to you and the rest of the staff

## 2012-12-28 ENCOUNTER — Telehealth (HOSPITAL_COMMUNITY): Payer: Self-pay | Admitting: Emergency Medicine

## 2012-12-29 ENCOUNTER — Telehealth (HOSPITAL_COMMUNITY): Payer: Self-pay | Admitting: *Deleted

## 2012-12-29 NOTE — ED Notes (Signed)
Looking for results on stool culture

## 2013-02-09 LAB — STOOL CULTURE

## 2013-02-10 ENCOUNTER — Telehealth (HOSPITAL_COMMUNITY): Payer: Self-pay | Admitting: *Deleted

## 2013-02-10 NOTE — ED Notes (Deleted)
Chart returned from EDP office

## 2013-03-17 ENCOUNTER — Telehealth: Payer: Self-pay | Admitting: Family Medicine

## 2013-03-17 NOTE — Telephone Encounter (Signed)
Pt needs refills on Astelin and Prilosec sent to [facility].

## 2013-03-23 ENCOUNTER — Other Ambulatory Visit: Payer: Self-pay | Admitting: Family Medicine

## 2013-03-23 MED ORDER — OMEPRAZOLE 40 MG PO CPDR: 40.0000 mg | DELAYED_RELEASE_CAPSULE | Freq: Every day | ORAL | Status: DC

## 2013-03-23 MED ORDER — AZELASTINE HCL 0.1 % NA SOLN: 1.0000 | Freq: Every day | NASAL | Status: DC

## 2013-03-23 NOTE — Telephone Encounter (Signed)
Rx refills was sent to the pharmacy. CLS 

## 2013-03-30 ENCOUNTER — Telehealth: Payer: Self-pay | Admitting: Family Medicine

## 2013-03-30 MED ORDER — OMEPRAZOLE 40 MG PO CPDR: 40.0000 mg | DELAYED_RELEASE_CAPSULE | Freq: Every day | ORAL | Status: DC

## 2013-03-30 MED ORDER — AZELASTINE HCL 0.1 % NA SOLN: 1.0000 | Freq: Every day | NASAL | Status: DC

## 2013-03-30 NOTE — Telephone Encounter (Signed)
Pt called and said his Astelin and Prilosec were to be for 3 month supply to [facility] and we sent to local pharm for 1 month.

## 2013-03-30 NOTE — Telephone Encounter (Signed)
90 day supply was sent to the [facility]. CLS

## 2013-05-05 ENCOUNTER — Encounter: Payer: Self-pay | Admitting: Family Medicine

## 2013-05-05 ENCOUNTER — Ambulatory Visit (INDEPENDENT_AMBULATORY_CARE_PROVIDER_SITE_OTHER): Payer: 59 | Admitting: Family Medicine

## 2013-05-05 VITALS — BP 120/60 | HR 75 | Temp 98.5°F | Wt 285.0 lb

## 2013-05-05 DIAGNOSIS — R197 Diarrhea, unspecified: Secondary | ICD-10-CM

## 2013-05-05 NOTE — Progress Notes (Signed)
  Subjective:    Patient ID: [name], male    DOB: June 08, 1956, 57 y.o.   MRN: [id number]  HPI Approximately 4 days ago he had the onset of diarrhea. Last night he developed some right lower quadrant pain and chills. Today he is having less diarrhea and pain but no chills. He states that his eating habits are unchanged. He has had a Roux-en-Y surgery. He has had previous difficulty with a Campylobacter infection. He states that the symptoms are similar to the previous episode led to the diagnosis of Campylobacter.   Review of Systems     Objective:   Physical Exam Alert and in no distress. Cardiac and lung exam is normal. Abdominal exam shows decreased breath sounds without masses or tenderness. No rebound.       Assessment & Plan:  Diarrhea  I will initially treat him conservatively and have him use Imodium if you would like. He is to keep in touch with me and if his fever and abdominal pain and diarrhea gets worse, he will call me

## 2013-06-29 ENCOUNTER — Telehealth: Payer: Self-pay | Admitting: Family Medicine

## 2013-06-29 NOTE — Telephone Encounter (Signed)
CALLED PT MOBILE TO LET HIM KNOW DR.LALONDE WANTS HIM TO HAVE FOLLOW UP APPOINTMENT LEFT WORD FOR WORD MESSAGE ON PT CELL #

## 2013-06-29 NOTE — Telephone Encounter (Signed)
[name] pt wanted you to know he's doing what you instructed him to do for his carpal tunnel and Left hand better, right hand getting worse again. Thumb, fore & middle fingers getting numb. Please advise

## 2013-06-29 NOTE — Telephone Encounter (Signed)
Have him set up an appointment for followup

## 2013-07-05 ENCOUNTER — Other Ambulatory Visit: Payer: Self-pay | Admitting: Medical

## 2013-07-07 ENCOUNTER — Encounter: Payer: Self-pay | Admitting: Family Medicine

## 2013-07-07 ENCOUNTER — Ambulatory Visit (INDEPENDENT_AMBULATORY_CARE_PROVIDER_SITE_OTHER): Payer: 59 | Admitting: Family Medicine

## 2013-07-07 VITALS — BP 102/58 | HR 66 | Wt 293.0 lb

## 2013-07-07 DIAGNOSIS — G5601 Carpal tunnel syndrome, right upper limb: Secondary | ICD-10-CM

## 2013-07-07 DIAGNOSIS — G5600 Carpal tunnel syndrome, unspecified upper limb: Secondary | ICD-10-CM

## 2013-07-07 MED ORDER — LIDOCAINE 1 % OPTIME INJ - NO CHARGE
1.0000 mL | Freq: Once | INTRAMUSCULAR | Status: DC
Start: 2013-07-07 — End: 2013-07-27

## 2013-07-07 MED ORDER — TRIAMCINOLONE ACETONIDE 40 MG/ML IJ SUSP
20.0000 mg | Freq: Once | INTRAMUSCULAR | Status: DC
Start: 2013-07-07 — End: 2014-10-08

## 2013-07-07 NOTE — Progress Notes (Signed)
   Subjective:    Patient ID: [name], male    DOB: 1955/08/11, 58 y.o.   MRN: [id number]  HPI He has a previous history of difficulty with CTS and has been using splints. He did get some relief but has noted return of symptoms especially on the right in spite of using splints. He has no weakness but does get some numbness and tingling in the median nerve distribution on the right.   Review of Systems     Objective:   Physical Exam Exam of the right wrist shows no atrophy with normal sensation and strength. Tinel's test was negative over Phalen's test positive.       Assessment & Plan:  CTS (carpal tunnel syndrome), right - Plan: lidocaine 1 % (XYLOCAINE) 1 % SOLN injection, triamcinolone acetonide (KENALOG-40) injection 20 mg  I discussed various options with him. We decided to give him an injection. 20 mg of Kenalog and 1 cc of Xylocaine was injected into the carpal tunnel on the right without difficulty. He did note tingling sensation in the median nerve distribution relatively quickly. If continued difficulty, orthopedic referral will be made. He  is comfortable with this approach.

## 2013-07-27 ENCOUNTER — Encounter: Payer: Self-pay | Admitting: Family Medicine

## 2013-07-27 ENCOUNTER — Ambulatory Visit (INDEPENDENT_AMBULATORY_CARE_PROVIDER_SITE_OTHER): Payer: 59 | Admitting: Family Medicine

## 2013-07-27 VITALS — BP 122/64 | HR 78 | Wt 300.0 lb

## 2013-07-27 DIAGNOSIS — M25519 Pain in unspecified shoulder: Secondary | ICD-10-CM

## 2013-07-27 DIAGNOSIS — M25511 Pain in right shoulder: Secondary | ICD-10-CM

## 2013-07-27 NOTE — Progress Notes (Signed)
   Subjective:    Patient ID: [name], male    DOB: 12-Jun-1956, 58 y.o.   MRN: [id number]  HPI He complains of a one-day history of right shoulder pain. Has not made worse with motion. His had no numbness, tingling or weakness. He did try and ibuprofen yesterday which helped but did not finding today.   Review of Systems     Objective:   Physical Exam Alert and in no distress. Full range of motion of the shoulder without pain. No palpable tenderness noted. No laxity. Negative drop arm test.       Assessment & Plan:  Right shoulder pain  I reassured him that I did not think this was cardiac in origin. Recommended continued use ibuprofen only more regularly.

## 2013-07-27 NOTE — Patient Instructions (Signed)
You can take 800 mg of ibuprofen 3 times per day if you need to

## 2013-08-27 ENCOUNTER — Other Ambulatory Visit: Payer: Self-pay | Admitting: Medical

## 2013-11-01 ENCOUNTER — Other Ambulatory Visit: Payer: Self-pay | Admitting: Medical

## 2013-11-04 ENCOUNTER — Other Ambulatory Visit: Payer: Self-pay | Admitting: Family Medicine

## 2014-02-05 ENCOUNTER — Telehealth: Payer: Self-pay | Admitting: Family Medicine

## 2014-02-05 ENCOUNTER — Other Ambulatory Visit: Payer: Self-pay

## 2014-02-05 MED ORDER — ONETOUCH VERIO IQ SYSTEM W/DEVICE KIT: 1.0000 | PACK | Freq: Every day | Status: DC

## 2014-02-05 NOTE — Telephone Encounter (Signed)
Go ahead and call this in for him 

## 2014-02-05 NOTE — Telephone Encounter (Signed)
CALLED AND LEFT MESSAGE HE DIDN'T SAY WHAT TYPE OF METER HE WANTED OR WHERE TO SEND IT SO [facility]

## 2014-02-05 NOTE — Telephone Encounter (Signed)
Patient lost his glucometer, needs new one and endocrinoligist is out of office     [facility]

## 2014-02-08 ENCOUNTER — Telehealth: Payer: Self-pay | Admitting: Family Medicine

## 2014-02-08 NOTE — Telephone Encounter (Signed)
[facility]

## 2014-02-10 ENCOUNTER — Other Ambulatory Visit: Payer: Self-pay | Admitting: Family Medicine

## 2014-02-10 ENCOUNTER — Other Ambulatory Visit: Payer: Self-pay | Admitting: Medical

## 2014-02-26 NOTE — Telephone Encounter (Signed)
.  02/26/2014

## 2014-03-05 ENCOUNTER — Encounter: Payer: Self-pay | Admitting: Gastroenterology

## 2014-05-25 ENCOUNTER — Encounter: Payer: Self-pay | Admitting: Family Medicine

## 2014-05-25 ENCOUNTER — Ambulatory Visit (INDEPENDENT_AMBULATORY_CARE_PROVIDER_SITE_OTHER): Payer: 59 | Admitting: Family Medicine

## 2014-05-25 VITALS — BP 116/74 | HR 72 | Wt 284.0 lb

## 2014-05-25 DIAGNOSIS — G5602 Carpal tunnel syndrome, left upper limb: Secondary | ICD-10-CM

## 2014-05-25 DIAGNOSIS — G5603 Carpal tunnel syndrome, bilateral upper limbs: Secondary | ICD-10-CM

## 2014-05-25 DIAGNOSIS — G5601 Carpal tunnel syndrome, right upper limb: Secondary | ICD-10-CM

## 2014-05-25 MED ORDER — LIDOCAINE HCL (PF) 1 % IJ SOLN: 2.0000 mL | Freq: Once | INTRAMUSCULAR | Status: DC

## 2014-05-25 MED ORDER — TRIAMCINOLONE ACETONIDE 40 MG/ML IJ SUSP: 40.0000 mg | Freq: Once | INTRAMUSCULAR | Status: DC

## 2014-05-25 NOTE — Progress Notes (Signed)
   Subjective:    Patient ID: [name], male    DOB: Nov 10, 1955, 58 y.o.   MRN: [id number]  HPI He is here for consult concerning difficulty with carpal tunnel syndrome. He has a previous history of difficulty with an injection on the right which did help his symptoms. This was done in January. Within the last month or so he has noted a recurrence of carpal tunnel bilaterally however the left is giving him much less difficulty and is responding to conservative care with the use of a splint, the right wrist in spite of using the point is still causing difficulties. He would like an injection.   Review of Systems     Objective:   Physical Exam Alert and in no distress. Exam of the right wrist shows normal strength and sensation. Tinel's test was negative however fails has positive.       Assessment & Plan:  Bilateral carpal tunnel syndrome  the wrist was prepped with Betadine. 20 mg of Kenalog and 1 mL of Xylocaine was injected into the carpal tunnel without difficulty. He did note anesthesia and the median nerve distribution. He will return here if further difficulties.

## 2014-07-09 ENCOUNTER — Ambulatory Visit (INDEPENDENT_AMBULATORY_CARE_PROVIDER_SITE_OTHER): Payer: 59 | Admitting: Family Medicine

## 2014-07-09 ENCOUNTER — Encounter: Payer: Self-pay | Admitting: Family Medicine

## 2014-07-09 VITALS — BP 122/74 | HR 72 | Ht 72.0 in | Wt 285.0 lb

## 2014-07-09 DIAGNOSIS — E785 Hyperlipidemia, unspecified: Secondary | ICD-10-CM

## 2014-07-09 DIAGNOSIS — G5602 Carpal tunnel syndrome, left upper limb: Secondary | ICD-10-CM

## 2014-07-09 DIAGNOSIS — G5603 Carpal tunnel syndrome, bilateral upper limbs: Secondary | ICD-10-CM

## 2014-07-09 DIAGNOSIS — Z0000 Encounter for general adult medical examination without abnormal findings: Secondary | ICD-10-CM

## 2014-07-09 DIAGNOSIS — I951 Orthostatic hypotension: Secondary | ICD-10-CM

## 2014-07-09 DIAGNOSIS — F329 Major depressive disorder, single episode, unspecified: Secondary | ICD-10-CM

## 2014-07-09 DIAGNOSIS — G4733 Obstructive sleep apnea (adult) (pediatric): Secondary | ICD-10-CM | POA: Insufficient documentation

## 2014-07-09 DIAGNOSIS — K219 Gastro-esophageal reflux disease without esophagitis: Secondary | ICD-10-CM

## 2014-07-09 DIAGNOSIS — F32A Depression, unspecified: Secondary | ICD-10-CM

## 2014-07-09 DIAGNOSIS — G5601 Carpal tunnel syndrome, right upper limb: Secondary | ICD-10-CM

## 2014-07-09 DIAGNOSIS — E1169 Type 2 diabetes mellitus with other specified complication: Secondary | ICD-10-CM

## 2014-07-09 DIAGNOSIS — E119 Type 2 diabetes mellitus without complications: Secondary | ICD-10-CM

## 2014-07-09 NOTE — Patient Instructions (Signed)
Go from lying to sitting to standing to help with your dizziness Try AYR for your nose.

## 2014-07-09 NOTE — Progress Notes (Signed)
   Subjective:    Patient ID: [name], male    DOB: 1956/01/03, 59 y.o.   MRN: [id number]  HPI He is here for a complete examination. He does complain of dizziness stating that he has several seconds with the dizziness every time he stands up. He has been evaluated in the past by allergy and ENT and has had no difficulty. He does have some slight difficulty with epistaxis. He has difficulty breathing from his nose and does use a CPAP. He states he usually breathes through his mouth. He continues to use his wrist splints for his CTS and is getting good results with this. His weight is relatively stable although he has had a gastric bypass. He does have underlying diabetes and is followed by Dr. Leslie DalesAltheimer. Social and family history were reviewed. His exercise activities are quite limited. Health maintenance was also reviewed. Work continues to go fairly well for him.   Review of Systems  All other systems reviewed and are negative.      Objective:   Physical Exam BP 122/74 mmHg  Pulse 72  Ht 6' (1.829 m)  Wt 285 lb (129.275 kg)  BMI 38.64 kg/m2  General Appearance:    Alert, cooperative, no distress, appears stated age  Head:    Normocephalic, without obvious abnormality, atraumatic  Eyes:    PERRL, conjunctiva/corneas clear, EOM's intact, fundi    benign  Ears:    Normal TM's and external ear canals  Nose:   Nares normal, mucosa normal, no drainage or sinus   tenderness  Throat:   Lips, mucosa, and tongue normal; teeth and gums normal  Neck:   Supple, no lymphadenopathy;  thyroid:  no   enlargement/tenderness/nodules; no carotid   bruit or JVD  Back:    Spine nontender, no curvature, ROM normal, no CVA     tenderness  Lungs:     Clear to auscultation bilaterally without wheezes, rales or     ronchi; respirations unlabored  Chest Wall:    No tenderness or deformity   Heart:    Regular rate and rhythm, S1 and S2 normal, no murmur, rub   or gallop  Breast Exam:    No chest  wall tenderness, masses or gynecomastia  Abdomen:     Soft, non-tender, nondistended, normoactive bowel sounds,    no masses, no hepatosplenomegaly        Extremities:   No clubbing, cyanosis or edema  Pulses:   2+ and symmetric all extremities  Skin:   Skin color, texture, turgor normal, no rashes or lesions  Lymph nodes:   Cervical, supraclavicular, and axillary nodes normal  Neurologic:   CNII-XII intact, normal strength, sensation and gait; reflexes 2+ and symmetric throughout          Psych:   Normal mood, affect, hygiene and grooming.     Hemoglobin A1c is 6.3     Assessment & Plan:  Bilateral carpal tunnel syndrome  Postural hypotension  Hyperlipidemia associated with type 2 diabetes mellitus  Gastroesophageal reflux disease without esophagitis  Type 2 diabetes mellitus without complication  Morbid obesity  Depression  I explained that the dizziness is postural in nature and recommended that he move more slowly from sitting to standing position. He was comfortable with this. No particular therapy for the epistaxis since there is no evidence of any recent bleeding. He will continue to wear the splints. Discussed his diet and exercise as well as follow-up with Dr. Leslie DalesAltheimer.

## 2014-07-30 ENCOUNTER — Other Ambulatory Visit: Payer: Self-pay | Admitting: Family Medicine

## 2014-09-16 ENCOUNTER — Encounter: Payer: Self-pay | Admitting: Family Medicine

## 2014-09-16 ENCOUNTER — Ambulatory Visit (INDEPENDENT_AMBULATORY_CARE_PROVIDER_SITE_OTHER): Payer: 59 | Admitting: Family Medicine

## 2014-09-16 VITALS — BP 120/70 | HR 78 | Wt 291.0 lb

## 2014-09-16 DIAGNOSIS — G5601 Carpal tunnel syndrome, right upper limb: Secondary | ICD-10-CM | POA: Diagnosis not present

## 2014-09-16 DIAGNOSIS — G5603 Carpal tunnel syndrome, bilateral upper limbs: Secondary | ICD-10-CM

## 2014-09-16 DIAGNOSIS — G4733 Obstructive sleep apnea (adult) (pediatric): Secondary | ICD-10-CM | POA: Diagnosis not present

## 2014-09-16 DIAGNOSIS — G5602 Carpal tunnel syndrome, left upper limb: Secondary | ICD-10-CM | POA: Diagnosis not present

## 2014-09-16 NOTE — Progress Notes (Signed)
   Subjective:    Patient ID: [name], male    DOB: Dec 05, 1955, 59 y.o.   MRN: [id number]  HPI  He is here for recheck. He does have a previous history of CTS. His right wrist was injected in November however his symptoms have reoccurred with numbness and tingling. He also has it to a lesser degree on the left side. He also has an underlying history of OSA and has been on CPAP. He has not had a read out in several years. He is now waking up feeling quite fatigued and has noted some cracks in the corners of his lips or he has had this machine for approximately 5 years.   Review of Systems     Objective:   Physical Exam Bilateral Phalen's and Tinel's test is positive. Normal strength. No thenar atrophy noted.      Assessment & Plan:  Bilateral carpal tunnel syndrome - Plan: Motor nerve conduction test w/ f-wave study, Ambulatory referral to Orthopedic Surgery  Obstructive sleep apnea Also instructed him to get his CPAP readout so I can determine whether he needs new settings. He also is out 5 years and probably does qualify for a new machine.

## 2014-09-22 ENCOUNTER — Telehealth: Payer: Self-pay | Admitting: Family Medicine

## 2014-09-22 NOTE — Telephone Encounter (Signed)
Please call re: ortho referral status

## 2014-09-22 NOTE — Telephone Encounter (Signed)
Called patient back left message for patient hand rehab they have been trying to call him I left number of [phone] for him to please give them a call to get appointment

## 2014-09-30 ENCOUNTER — Telehealth: Payer: Self-pay | Admitting: Family Medicine

## 2014-09-30 ENCOUNTER — Other Ambulatory Visit: Payer: Self-pay

## 2014-09-30 MED ORDER — TRAMADOL HCL 50 MG PO TABS: 50.0000 mg | ORAL_TABLET | Freq: Four times a day (QID) | ORAL | Status: DC | PRN

## 2014-09-30 NOTE — Telephone Encounter (Signed)
Called patient he was informed word for word called tramdol in patient verbalized understanding

## 2014-09-30 NOTE — Telephone Encounter (Signed)
Have him use 2 Aleve twice per day and if no benefit then add tramadol 50 mg 4 times per day. Call the tramadol in and give 20 tabs

## 2014-09-30 NOTE — Telephone Encounter (Addendum)
Requesting pain med to help with carpel tunnel pain. Pt has been missing work, not performing well at work, not sleeping well, falling asleep while driving. Pt went for nerve testing yesterday but need something for pain now. Send med to local pharmacy @ [facility] @ [address]. Pt can be reached @ [phone]

## 2014-10-01 ENCOUNTER — Telehealth: Payer: Self-pay

## 2014-10-01 ENCOUNTER — Other Ambulatory Visit: Payer: Self-pay

## 2014-10-01 DIAGNOSIS — G5601 Carpal tunnel syndrome, right upper limb: Secondary | ICD-10-CM

## 2014-10-01 HISTORY — DX: Carpal tunnel syndrome, right upper limb: G56.01

## 2014-10-01 NOTE — Telephone Encounter (Signed)
I informed patient of his appointment with Dr.Kevin Kuzma 10/06/14 at 1:30 pm pt verbalized understanding

## 2014-10-04 ENCOUNTER — Telehealth: Payer: Self-pay | Admitting: Family Medicine

## 2014-10-04 NOTE — Telephone Encounter (Signed)
Pt says Dr Susann GivensLalonde need pt's last sleep study report for his CPAP machine however pt says no one cn locate a copy of it. What does he do now? I looked in both his electronic chart and paper chart in our office and sleep study was not in either. Pt says his records were transferred to us from Dr Tedra SenegalBland's office prior to him starting to coming to our office and that is when the sleep study was done. It was done about 10 years ago. [facility] and medical records are searching through their records to see if they can find record of the study and will call back

## 2014-10-05 ENCOUNTER — Other Ambulatory Visit: Payer: Self-pay | Admitting: Family Medicine

## 2014-10-05 ENCOUNTER — Encounter: Payer: Self-pay | Admitting: Family Medicine

## 2014-10-07 ENCOUNTER — Other Ambulatory Visit: Payer: Self-pay | Admitting: Orthopedic Surgery

## 2014-10-08 ENCOUNTER — Encounter (HOSPITAL_BASED_OUTPATIENT_CLINIC_OR_DEPARTMENT_OTHER): Payer: Self-pay | Admitting: *Deleted

## 2014-10-08 NOTE — Pre-Procedure Instructions (Signed)
To come for BMET and EKG 

## 2014-10-13 ENCOUNTER — Encounter (HOSPITAL_BASED_OUTPATIENT_CLINIC_OR_DEPARTMENT_OTHER)
Admission: RE | Admit: 2014-10-13 | Discharge: 2014-10-13 | Disposition: A | Discharge status: Home / Self Care | Payer: 59 | Source: Ambulatory Visit | Attending: Orthopedic Surgery | Admitting: Orthopedic Surgery

## 2014-10-13 DIAGNOSIS — K219 Gastro-esophageal reflux disease without esophagitis: Secondary | ICD-10-CM | POA: Diagnosis not present

## 2014-10-13 DIAGNOSIS — Z7982 Long term (current) use of aspirin: Secondary | ICD-10-CM | POA: Diagnosis not present

## 2014-10-13 DIAGNOSIS — I10 Essential (primary) hypertension: Secondary | ICD-10-CM | POA: Diagnosis not present

## 2014-10-13 DIAGNOSIS — E119 Type 2 diabetes mellitus without complications: Secondary | ICD-10-CM | POA: Diagnosis not present

## 2014-10-13 DIAGNOSIS — F329 Major depressive disorder, single episode, unspecified: Secondary | ICD-10-CM | POA: Diagnosis not present

## 2014-10-13 DIAGNOSIS — Z794 Long term (current) use of insulin: Secondary | ICD-10-CM | POA: Diagnosis not present

## 2014-10-13 DIAGNOSIS — Z87891 Personal history of nicotine dependence: Secondary | ICD-10-CM | POA: Diagnosis not present

## 2014-10-13 DIAGNOSIS — E785 Hyperlipidemia, unspecified: Secondary | ICD-10-CM | POA: Diagnosis not present

## 2014-10-13 DIAGNOSIS — G5601 Carpal tunnel syndrome, right upper limb: Secondary | ICD-10-CM | POA: Diagnosis not present

## 2014-10-13 DIAGNOSIS — E669 Obesity, unspecified: Secondary | ICD-10-CM | POA: Diagnosis not present

## 2014-10-13 DIAGNOSIS — Z9861 Coronary angioplasty status: Secondary | ICD-10-CM | POA: Diagnosis not present

## 2014-10-13 DIAGNOSIS — G4730 Sleep apnea, unspecified: Secondary | ICD-10-CM | POA: Diagnosis not present

## 2014-10-13 LAB — BASIC METABOLIC PANEL
Anion gap: 5 (ref 5–15)
BUN: 15 mg/dL (ref 6–23)
CO2: 27 mmol/L (ref 19–32)
Calcium: 9.2 mg/dL (ref 8.4–10.5)
Chloride: 105 mmol/L (ref 96–112)
Creatinine, Ser: 0.84 mg/dL (ref 0.50–1.35)
GFR calc Af Amer: >90 mL/min (ref >90)
GFR calc non Af Amer: >90 mL/min (ref >90)
Glucose, Bld: 207 mg/dL — ABNORMAL HIGH (ref 70–99)
Potassium: 5 mmol/L (ref 3.5–5.1)
Sodium: 137 mmol/L (ref 135–145)

## 2014-10-14 ENCOUNTER — Ambulatory Visit (HOSPITAL_BASED_OUTPATIENT_CLINIC_OR_DEPARTMENT_OTHER)
Admission: RE | Admit: 2014-10-14 | Discharge: 2014-10-14 | Disposition: A | Discharge status: Home / Self Care | Payer: 59 | Source: Ambulatory Visit | Attending: Orthopedic Surgery | Admitting: Orthopedic Surgery

## 2014-10-14 ENCOUNTER — Ambulatory Visit (HOSPITAL_BASED_OUTPATIENT_CLINIC_OR_DEPARTMENT_OTHER): Payer: 59 | Admitting: Anesthesiology

## 2014-10-14 ENCOUNTER — Encounter (HOSPITAL_BASED_OUTPATIENT_CLINIC_OR_DEPARTMENT_OTHER): Payer: Self-pay

## 2014-10-14 ENCOUNTER — Encounter (HOSPITAL_BASED_OUTPATIENT_CLINIC_OR_DEPARTMENT_OTHER)
Admission: RE | Disposition: A | Discharge status: Home / Self Care | Payer: Self-pay | Source: Ambulatory Visit | Attending: Orthopedic Surgery

## 2014-10-14 DIAGNOSIS — E785 Hyperlipidemia, unspecified: Secondary | ICD-10-CM | POA: Insufficient documentation

## 2014-10-14 DIAGNOSIS — G5601 Carpal tunnel syndrome, right upper limb: Secondary | ICD-10-CM | POA: Insufficient documentation

## 2014-10-14 DIAGNOSIS — Z9861 Coronary angioplasty status: Secondary | ICD-10-CM | POA: Insufficient documentation

## 2014-10-14 DIAGNOSIS — E669 Obesity, unspecified: Secondary | ICD-10-CM | POA: Insufficient documentation

## 2014-10-14 DIAGNOSIS — I10 Essential (primary) hypertension: Secondary | ICD-10-CM | POA: Insufficient documentation

## 2014-10-14 DIAGNOSIS — Z794 Long term (current) use of insulin: Secondary | ICD-10-CM | POA: Insufficient documentation

## 2014-10-14 DIAGNOSIS — K219 Gastro-esophageal reflux disease without esophagitis: Secondary | ICD-10-CM | POA: Insufficient documentation

## 2014-10-14 DIAGNOSIS — F329 Major depressive disorder, single episode, unspecified: Secondary | ICD-10-CM | POA: Insufficient documentation

## 2014-10-14 DIAGNOSIS — E119 Type 2 diabetes mellitus without complications: Secondary | ICD-10-CM | POA: Insufficient documentation

## 2014-10-14 DIAGNOSIS — Z87891 Personal history of nicotine dependence: Secondary | ICD-10-CM | POA: Insufficient documentation

## 2014-10-14 DIAGNOSIS — Z7982 Long term (current) use of aspirin: Secondary | ICD-10-CM | POA: Insufficient documentation

## 2014-10-14 DIAGNOSIS — G4730 Sleep apnea, unspecified: Secondary | ICD-10-CM | POA: Insufficient documentation

## 2014-10-14 HISTORY — DX: Gastro-esophageal reflux disease without esophagitis: K21.9

## 2014-10-14 HISTORY — DX: Reserved for inherently not codable concepts without codable children: IMO0001

## 2014-10-14 HISTORY — DX: Long term (current) use of insulin: Z79.4

## 2014-10-14 HISTORY — DX: Depression, unspecified: F32.A

## 2014-10-14 HISTORY — DX: Dental restoration status: Z98.811

## 2014-10-14 HISTORY — DX: Unspecified osteoarthritis, unspecified site: M19.90

## 2014-10-14 HISTORY — DX: Major depressive disorder, single episode, unspecified: F32.9

## 2014-10-14 HISTORY — DX: Carpal tunnel syndrome, right upper limb: G56.01

## 2014-10-14 HISTORY — DX: Nummular dermatitis: L30.0

## 2014-10-14 HISTORY — PX: CARPAL TUNNEL RELEASE: SHX101

## 2014-10-14 HISTORY — DX: Type 2 diabetes mellitus without complications: E11.9

## 2014-10-14 LAB — POCT HEMOGLOBIN-HEMACUE: Hemoglobin: 10.4 g/dL — ABNORMAL LOW (ref 13.0–17.0)

## 2014-10-14 LAB — GLUCOSE, CAPILLARY
Glucose-Capillary: 107 mg/dL — ABNORMAL HIGH (ref 70–99)
Glucose-Capillary: 108 mg/dL — ABNORMAL HIGH (ref 70–99)

## 2014-10-14 SURGERY — CARPAL TUNNEL RELEASE
Anesthesia: Monitor Anesthesia Care | Site: Hand | Laterality: Right

## 2014-10-14 MED ORDER — OXYCODONE HCL 5 MG PO TABS
ORAL_TABLET | ORAL | Status: AC
  Filled 2014-10-14: qty 1

## 2014-10-14 MED ORDER — BUPIVACAINE HCL (PF) 0.25 % IJ SOLN
INTRAMUSCULAR | Status: AC
  Filled 2014-10-14: qty 30

## 2014-10-14 MED ORDER — CEFAZOLIN SODIUM 1-5 GM-% IV SOLN
INTRAVENOUS | Status: AC
  Filled 2014-10-14: qty 50

## 2014-10-14 MED ORDER — MIDAZOLAM HCL 2 MG/2ML IJ SOLN
INTRAMUSCULAR | Status: AC
  Filled 2014-10-14: qty 2

## 2014-10-14 MED ORDER — OXYCODONE HCL 5 MG PO TABS
5.0000 mg | ORAL_TABLET | Freq: Once | ORAL | Status: AC | PRN
  Administered 2014-10-14: 5 mg via ORAL

## 2014-10-14 MED ORDER — OXYCODONE-ACETAMINOPHEN 5-325 MG PO TABS: ORAL_TABLET | ORAL | Status: DC

## 2014-10-14 MED ORDER — PROMETHAZINE HCL 25 MG/ML IJ SOLN: 6.2500 mg | INTRAMUSCULAR | Status: DC | PRN

## 2014-10-14 MED ORDER — BUPIVACAINE HCL (PF) 0.25 % IJ SOLN
INTRAMUSCULAR | Status: DC | PRN
  Administered 2014-10-14: 10 mL

## 2014-10-14 MED ORDER — DEXTROSE 5 % IV SOLN
3.0000 g | INTRAVENOUS | Status: AC
  Administered 2014-10-14: 3 g via INTRAVENOUS

## 2014-10-14 MED ORDER — FENTANYL CITRATE 0.05 MG/ML IJ SOLN
50.0000 ug | INTRAMUSCULAR | Status: DC | PRN
  Administered 2014-10-14 (×2): 50 ug via INTRAVENOUS

## 2014-10-14 MED ORDER — MIDAZOLAM HCL 2 MG/2ML IJ SOLN
1.0000 mg | INTRAMUSCULAR | Status: DC | PRN
  Administered 2014-10-14: 2 mg via INTRAVENOUS

## 2014-10-14 MED ORDER — CHLORHEXIDINE GLUCONATE 4 % EX LIQD: 60.0000 mL | Freq: Once | CUTANEOUS | Status: DC

## 2014-10-14 MED ORDER — ONDANSETRON HCL 4 MG/2ML IJ SOLN
INTRAMUSCULAR | Status: DC | PRN
  Administered 2014-10-14: 4 mg via INTRAVENOUS

## 2014-10-14 MED ORDER — PROPOFOL INFUSION 10 MG/ML OPTIME
INTRAVENOUS | Status: DC | PRN
  Administered 2014-10-14: 50 ug/kg/min via INTRAVENOUS

## 2014-10-14 MED ORDER — LACTATED RINGERS IV SOLN
INTRAVENOUS | Status: DC
  Administered 2014-10-14: 09:00:00 via INTRAVENOUS

## 2014-10-14 MED ORDER — FENTANYL CITRATE 0.05 MG/ML IJ SOLN
INTRAMUSCULAR | Status: AC
  Filled 2014-10-14: qty 4

## 2014-10-14 MED ORDER — MIDAZOLAM HCL 2 MG/ML PO SYRP: 12.0000 mg | ORAL_SOLUTION | Freq: Once | ORAL | Status: DC | PRN

## 2014-10-14 MED ORDER — HYDROMORPHONE HCL 1 MG/ML IJ SOLN
0.2500 mg | INTRAMUSCULAR | Status: DC | PRN
  Administered 2014-10-14 (×2): 0.5 mg via INTRAVENOUS

## 2014-10-14 MED ORDER — LIDOCAINE HCL (PF) 0.5 % IJ SOLN
INTRAMUSCULAR | Status: DC | PRN
  Administered 2014-10-14: 30 mL via INTRAVENOUS

## 2014-10-14 MED ORDER — HYDROMORPHONE HCL 1 MG/ML IJ SOLN
INTRAMUSCULAR | Status: AC
  Filled 2014-10-14: qty 1

## 2014-10-14 MED ORDER — CEFAZOLIN SODIUM-DEXTROSE 2-3 GM-% IV SOLR
INTRAVENOUS | Status: AC
  Filled 2014-10-14: qty 50

## 2014-10-14 MED ORDER — OXYCODONE HCL 5 MG/5ML PO SOLN: 5.0000 mg | Freq: Once | ORAL | Status: AC | PRN

## 2014-10-14 SURGICAL SUPPLY — 39 items
BANDAGE ELASTIC 3 VELCRO ST LF (GAUZE/BANDAGES/DRESSINGS) ×2 IMPLANT
BLADE MINI RND TIP GREEN BEAV (BLADE) IMPLANT
BLADE SURG 15 STRL LF DISP TIS (BLADE) ×2 IMPLANT
BLADE SURG 15 STRL SS (BLADE) ×4
BNDG CMPR 9X4 STRL LF SNTH (GAUZE/BANDAGES/DRESSINGS)
BNDG ESMARK 4X9 LF (GAUZE/BANDAGES/DRESSINGS) IMPLANT
BNDG GAUZE ELAST 4 BULKY (GAUZE/BANDAGES/DRESSINGS) ×2 IMPLANT
CHLORAPREP W/TINT 26ML (MISCELLANEOUS) ×2 IMPLANT
CORDS BIPOLAR (ELECTRODE) ×2 IMPLANT
COVER BACK TABLE 60X90IN (DRAPES) ×2 IMPLANT
COVER MAYO STAND STRL (DRAPES) ×2 IMPLANT
CUFF TOURNIQUET SINGLE 18IN (TOURNIQUET CUFF) ×2 IMPLANT
DRAPE EXTREMITY T 121X128X90 (DRAPE) ×2 IMPLANT
DRAPE SURG 17X23 STRL (DRAPES) ×2 IMPLANT
DRSG PAD ABDOMINAL 8X10 ST (GAUZE/BANDAGES/DRESSINGS) ×2 IMPLANT
GAUZE SPONGE 4X4 12PLY STRL (GAUZE/BANDAGES/DRESSINGS) ×2 IMPLANT
GAUZE XEROFORM 1X8 LF (GAUZE/BANDAGES/DRESSINGS) ×2 IMPLANT
GLOVE BIO SURGEON STRL SZ 6.5 (GLOVE) ×1 IMPLANT
GLOVE BIO SURGEON STRL SZ7.5 (GLOVE) ×2 IMPLANT
GLOVE BIOGEL PI IND STRL 7.0 (GLOVE) IMPLANT
GLOVE BIOGEL PI IND STRL 8 (GLOVE) ×1 IMPLANT
GLOVE BIOGEL PI INDICATOR 7.0 (GLOVE) ×1
GLOVE BIOGEL PI INDICATOR 8 (GLOVE) ×1
GLOVE EXAM NITRILE EXT CUFF MD (GLOVE) ×1 IMPLANT
GOWN STRL REUS W/ TWL LRG LVL3 (GOWN DISPOSABLE) ×1 IMPLANT
GOWN STRL REUS W/TWL LRG LVL3 (GOWN DISPOSABLE) ×2
GOWN STRL REUS W/TWL XL LVL3 (GOWN DISPOSABLE) ×2 IMPLANT
NDL HYPO 25X1 1.5 SAFETY (NEEDLE) IMPLANT
NEEDLE HYPO 25X1 1.5 SAFETY (NEEDLE) ×2 IMPLANT
NS IRRIG 1000ML POUR BTL (IV SOLUTION) ×2 IMPLANT
PACK BASIN DAY SURGERY FS (CUSTOM PROCEDURE TRAY) ×2 IMPLANT
PADDING CAST ABS 4INX4YD NS (CAST SUPPLIES)
PADDING CAST ABS COTTON 4X4 ST (CAST SUPPLIES) ×1 IMPLANT
STOCKINETTE 4X48 STRL (DRAPES) ×2 IMPLANT
SUT ETHILON 4 0 PS 2 18 (SUTURE) ×2 IMPLANT
SYR BULB 3OZ (MISCELLANEOUS) ×2 IMPLANT
SYR CONTROL 10ML LL (SYRINGE) ×1 IMPLANT
TOWEL OR 17X24 6PK STRL BLUE (TOWEL DISPOSABLE) ×3 IMPLANT
UNDERPAD 30X30 INCONTINENT (UNDERPADS AND DIAPERS) ×2 IMPLANT

## 2014-10-14 NOTE — Brief Op Note (Signed)
10/14/2014  1:20 PM  PATIENT:  [name]  59 y.o. male  PRE-OPERATIVE DIAGNOSIS:  right carpal tunnel syndrome G56.01  POST-OPERATIVE DIAGNOSIS:  right carpal tunnel syndrome G56.01  PROCEDURE:  Procedure(s): RIGHT CARPAL TUNNEL RELEASE (Right)  SURGEON:  Surgeon(s) and Role:    * Betha LoaKevin Clete Kuch, MD - Primary  PHYSICIAN ASSISTANT:   ASSISTANTS: none   ANESTHESIA:   Bier block with sedation  EBL:  Total I/O In: 1000 [I.V.:1000] Out: -   BLOOD ADMINISTERED:none  DRAINS: none   LOCAL MEDICATIONS USED:  MARCAINE     SPECIMEN:  No Specimen  DISPOSITION OF SPECIMEN:  N/A  COUNTS:  YES  TOURNIQUET:   Total Tourniquet Time Documented: Forearm (Right) - 32 minutes Total: Forearm (Right) - 32 minutes   DICTATION: .Other Dictation: Dictation Number [id number]  PLAN OF CARE: Discharge to home after PACU  PATIENT DISPOSITION:  PACU - hemodynamically stable.

## 2014-10-14 NOTE — Anesthesia Postprocedure Evaluation (Signed)
Anesthesia Post Note  Patient: [name]  Procedure(s) Performed: Procedure(s) (LRB): RIGHT CARPAL TUNNEL RELEASE (Right)  Anesthesia type: MAC  Patient location: PACU  Post pain: Pain level controlled  Post assessment: Patient's Cardiovascular Status Stable  Last Vitals:  Filed Vitals:   10/14/14 1415  BP: 141/65  Pulse: 54  Temp:   Resp: 10    Post vital signs: Reviewed and stable  Level of consciousness: sedated  Complications: No apparent anesthesia complications

## 2014-10-14 NOTE — Anesthesia Preprocedure Evaluation (Signed)
Anesthesia Evaluation  Patient identified by MRN, date of birth, ID band  Reviewed: Allergy & Precautions, NPO status , Patient's Chart, lab work & pertinent test results  Airway Mallampati: II  TM Distance: >3 FB Neck ROM: Full    Dental  (+) Teeth Intact, Dental Advisory Given   Pulmonary sleep apnea , former smoker,    Pulmonary exam normal       Cardiovascular hypertension, Pt. on medications     Neuro/Psych Depression    GI/Hepatic Neg liver ROS, GERD-  Medicated,  Endo/Other  diabetes  Renal/GU negative Renal ROS     Musculoskeletal   Abdominal   Peds  Hematology   Anesthesia Other Findings   Reproductive/Obstetrics                             Anesthesia Physical Anesthesia Plan  ASA: III  Anesthesia Plan: MAC and Bier Block   Post-op Pain Management:    Induction:   Airway Management Planned: Simple Face Mask  Additional Equipment:   Intra-op Plan:   Post-operative Plan: Extubation in OR  Informed Consent: I have reviewed the patients History and Physical, chart, labs and discussed the procedure including the risks, benefits and alternatives for the proposed anesthesia with the patient or authorized representative who has indicated his/her understanding and acceptance.   Dental advisory given  Plan Discussed with: CRNA, Anesthesiologist and Surgeon  Anesthesia Plan Comments:         Anesthesia Quick Evaluation

## 2014-10-14 NOTE — Transfer of Care (Signed)
Immediate Anesthesia Transfer of Care Note  Patient: [name]  Procedure(s) Performed: Procedure(s): RIGHT CARPAL TUNNEL RELEASE (Right)  Patient Location: PACU  Anesthesia Type:Bier block  Level of Consciousness: awake and patient cooperative  Airway & Oxygen Therapy: Patient Spontanous Breathing and Patient connected to face mask oxygen  Post-op Assessment: Report given to RN and Post -op Vital signs reviewed and stable  Post vital signs: Reviewed and stable  Last Vitals:  Filed Vitals:   10/14/14 1326  BP:   Pulse: 63  Temp: 36.5 C  Resp: 13    Complications: No apparent anesthesia complications

## 2014-10-14 NOTE — Op Note (Signed)
[id number] 

## 2014-10-14 NOTE — Op Note (Signed)
[name]:  [name], [name]            ACCOUNT NO.:  [id number]  MEDICAL RECORD NO.:  [id number]  LOCATION:                                 FACILITY:  PHYSICIAN:  Betha LoaKevin Aunica Dauphinee, MD             DATE OF BIRTH:  DATE OF PROCEDURE:  10/14/2014 DATE OF DISCHARGE:                              OPERATIVE REPORT   PREOPERATIVE DIAGNOSIS:  Right carpal tunnel syndrome.  POSTOPERATIVE DIAGNOSIS:  Right carpal tunnel syndrome.  PROCEDURE:  Right carpal tunnel release.  SURGEON:  Betha LoaKevin Shar Paez, MD.  ASSISTANT:  None.  ANESTHESIA:  Bier block with sedation.  IV FLUIDS:  Per anesthesia flow sheet.  ESTIMATED BLOOD LOSS:  Minimal.  COMPLICATIONS:  None.  SPECIMENS:  None .  TOURNIQUET TIME:  Thirty two minutes.  DISPOSITION:  Stable to PACU.  INDICATIONS:  [name] is a 59 year old male who has noted pins and needle sensation in the right hand.  He has nocturnal symptoms.  He has positive nerve conduction studies.  He wishes to have the carpal tunnel release for management of symptoms.  Risks, benefits, and  alternatives of surgery were discussed including risk of blood loss; infection; damage to nerves, vessels, tendons, ligaments, bone; failure of surgery; need for additional surgery; complications with wound healing; continued pain; recurrence of carpal tunnel syndrome; and damage motor branch.  He voiced understanding of these risks and elected to proceed.  OPERATIVE COURSE:  After being identified preoperatively by myself, the patient and I agreed upon procedure and site procedure.  Surgical site was marked.  The risks, benefits, and alternatives of surgery were reviewed and wished to proceed.  Surgical consent had been signed.  He was given IV Ancef as preoperative antibiotic prophylaxis.  He was transferred to the operating room and placed on the operative table in supine position with the right upper extremity on arm board.  Bier block anesthesia was induced by  anesthesiologist.  The right upper extremity was prepped and draped in normal sterile orthopedic fashion.  A surgical pause was performed between surgeons, anesthesia, operating staff, and all were in agreement as to the patient, procedure, and site of procedure.  Tourniquet at the proximal aspect of the forearm had been inflated for the Bier block.  Incision was made over the transverse carpal ligament and carried to subcutaneous tissues by spreading technique.  Bipolar electrocautery was used to obtain hemostasis.  The palmar fascia was sharply incised.  Transverse carpal ligament was identified.  It was sharply incised distally.  Care was taken to ensure complete decompression distally and then incised proximally.  Scissors were used to split the distal aspect of the volar antebrachial fascia. Finger was placed into the wound to ensure complete decompression which was the case.  The nerve was inspected.  It was flattened and adherent to the radial leaflet.  The motor branch was identified and was intact. The wound was copiously irrigated with sterile saline and closed with 4- 0 nylon a horizontal mattress fashion.  It was injected with 10 mL of 0.25% plain Marcaine to aid in postoperative analgesia.  It was then dressed with sterile Xeroform, 4x4s, and ABD and wrapped with  Kerlix and Ace bandage.  Tourniquet was deflated at 32 minutes.  Fingertips were pink with brisk capillary refill after deflation of tourniquet. Operative drapes were broken down.  The patient was awoken from anesthesia safely.  He was transferred back to stretcher and taken to PACU in stable condition.  I will see him back in the office in 1 week for postoperative followup.  I will give Percocet 5/325, 1-2 p.o. q.6 hours p.r.n. pain, dispensed #30.     Betha Loa, MD     KK/MEDQ  D:  10/14/2014  T:  10/14/2014  Job:  [id number]

## 2014-10-14 NOTE — H&P (Signed)
[name] is an 59 y.o. male.   Chief Complaint: right carpal tunnel syndrome HPI: 59 yo rhd male with 2 years numbness and tingling in bilateral hands.  Right greater than left.  He has nocturnal symptoms 3 times per week that wake him.  Positive nerve conduction studies.  He wishes to have a right carpal tunnel release for management of his symptoms.  Past Medical History  Diagnosis Date  . Obesity   . Dyslipidemia   . Arthritis     back, shoulders, left great toe  . Hypertension     under control with med., per pt.; has been on med. x 15-20 yr.  . Sleep apnea     uses CPAP nightly  . Insulin dependent diabetes mellitus   . GERD (gastroesophageal reflux disease)   . Depression   . Dental crowns present   . Carpal tunnel syndrome of right wrist 10/2014  . Nummular eczema     Past Surgical History  Procedure Laterality Date  . Roux-en-y procedure  07/28/2007    with lysis of adhesions  . Cyst excision      scalp and back  . Foot surgery Left     revision traumatic amputation of foot  . Esophagogastroduodenoscopy  03/25/2007  . Cardiac catheterization  12/30/2006    "mod. pulmonary HTN with preserved cardiac output and cardiac index"    Family History  Problem Relation Age of Onset  . Heart disease Mother   . Cancer Mother 76    Multiple myeloma  . Diabetes Father   . Diabetes Brother   . Heart disease Brother 27    cardiomyopathy  . Arthritis Paternal Grandmother   . Diabetes Paternal Grandmother   . Heart disease Paternal Grandmother   . Stroke Paternal Grandmother   . Heart disease Brother 40    MI   Social History:  reports that he quit smoking about 7 years ago. He has never used smokeless tobacco. He reports that he does not drink alcohol or use illicit drugs.  Allergies: No Known Allergies  Facility-administered medications prior to admission  Medication Dose Route Frequency Provider Last Rate Last Dose  . lidocaine (PF) (XYLOCAINE) 1 % injection  2 mL  2 mL Intradermal Once John C Lalonde, MD       Medications Prior to Admission  Medication Sig Dispense Refill  . aspirin EC 81 MG tablet Take 81 mg by mouth at bedtime.    . azelastine (ASTELIN) 0.1 % nasal spray Use 1 spray in each nostril at bedtime. 30 mL 1  . B Complex-C (B-COMPLEX WITH VITAMIN C) tablet Take 1 tablet by mouth 2 (two) times daily.    . buPROPion (WELLBUTRIN SR) 200 MG 12 hr tablet Take 200 mg by mouth 2 (two) times daily.      . calcium-vitamin D 250-100 MG-UNIT per tablet Take 1 tablet by mouth 2 (two) times daily.      . cetirizine (ZYRTEC) 10 MG tablet Take 10 mg by mouth daily.      . desvenlafaxine (PRISTIQ) 100 MG 24 hr tablet Take 100 mg by mouth daily.    . insulin glargine (LANTUS) 100 UNIT/ML injection Inject 60 Units into the skin at bedtime.     . lisinopril (PRINIVIL,ZESTRIL) 10 MG tablet Take 10 mg by mouth daily.      . metFORMIN (GLUCOPHAGE) 1000 MG tablet Take 1,000 mg by mouth 2 (two) times daily with a meal.      .   Multiple Vitamins-Minerals (MULTIVITAMIN WITH MINERALS) tablet Take 1 tablet by mouth 2 (two) times daily.      . omeprazole (PRILOSEC) 40 MG capsule Take 1 capsule by mouth  daily 90 capsule 4  . simvastatin (ZOCOR) 40 MG tablet Take 40 mg by mouth daily.    . Blood Glucose Monitoring Suppl (ONETOUCH VERIO IQ SYSTEM) W/DEVICE KIT 1 each by Does not apply route daily. 1 kit 0    Results for orders placed or performed during the hospital encounter of 10/14/14 (from the past 48 hour(s))  Basic metabolic panel     Status: Abnormal   Collection Time: 10/13/14  4:55 PM  Result Value Ref Range   Sodium 137 135 - 145 mmol/L   Potassium 5.0 3.5 - 5.1 mmol/L   Chloride 105 96 - 112 mmol/L   CO2 27 19 - 32 mmol/L   Glucose, Bld 207 (H) 70 - 99 mg/dL   BUN 15 6 - 23 mg/dL   Creatinine, Ser 0.84 0.50 - 1.35 mg/dL   Calcium 9.2 8.4 - 10.5 mg/dL   GFR calc non Af Amer >90 >90 mL/min   GFR calc Af Amer >90 >90 mL/min    Comment: (NOTE) The  eGFR has been calculated using the CKD EPI equation. This calculation has not been validated in all clinical situations. eGFR's persistently <90 mL/min signify possible Chronic Kidney Disease.    Anion gap 5 5 - 15    No results found.   A comprehensive review of systems was negative except for: Eyes: positive for contacts/glasses Hematologic/lymphatic: positive for easy bruising Musculoskeletal: positive for arthralgias and back pain Neurological: positive for gait problems and headaches Behavioral/Psych: positive for depression and sleep disturbance  Blood pressure 134/67, pulse 65, temperature 98.2 F (36.8 C), temperature source Oral, resp. rate 20, height 6' (1.829 m), weight 131.543 kg (290 lb), SpO2 99 %.  General appearance: alert, cooperative and appears stated age Head: Normocephalic, without obvious abnormality, atraumatic Neck: supple, symmetrical, trachea midline Resp: clear to auscultation bilaterally Cardio: regular rate and rhythm GI: non tender Extremities: intact sensation and capillary refill all digits.  +epl/fpl/io.  no wounds. Pulses: 2+ and symmetric Skin: Skin color, texture, turgor normal. No rashes or lesions Neurologic: Grossly normal Incision/Wound: none  Assessment/Plan Right carpal tunnel syndrome.  Non operative and operative treatment options were discussed with the patient and patient wishes to proceed with operative treatment. Risks, benefits, and alternatives of surgery were discussed and the patient agrees with the plan of care.   , R 10/14/2014, 8:38 AM     

## 2014-10-14 NOTE — Discharge Instructions (Addendum)

## 2014-10-15 ENCOUNTER — Encounter (HOSPITAL_BASED_OUTPATIENT_CLINIC_OR_DEPARTMENT_OTHER): Payer: Self-pay | Admitting: Orthopedic Surgery

## 2014-11-17 ENCOUNTER — Telehealth: Payer: Self-pay | Admitting: Family Medicine

## 2014-11-17 DIAGNOSIS — G4733 Obstructive sleep apnea (adult) (pediatric): Secondary | ICD-10-CM

## 2014-11-17 NOTE — Telephone Encounter (Signed)
Slowed up a sleep study. He has a history of OSA

## 2014-11-17 NOTE — Telephone Encounter (Signed)
Please call re sleep study  He needs sleep study set up. He was supposed to have copy of previous sleep study sent over but that office told him they have shredded those records

## 2014-11-18 ENCOUNTER — Other Ambulatory Visit: Payer: Self-pay | Admitting: Family Medicine

## 2014-11-18 DIAGNOSIS — G4733 Obstructive sleep apnea (adult) (pediatric): Secondary | ICD-10-CM

## 2014-11-18 NOTE — Telephone Encounter (Signed)
Called [facility], made appt. , called pt.  Pt states that June 23 not a good date for him.  [facility] and he will call to rescedule the appt for a more convenient time.

## 2014-11-18 NOTE — Telephone Encounter (Signed)
Called [facility], set up appt. December 23, 2014.  Hospital will send an information packet to pt.

## 2014-12-16 ENCOUNTER — Other Ambulatory Visit: Payer: Self-pay | Admitting: Orthopedic Surgery

## 2014-12-20 ENCOUNTER — Encounter: Payer: Self-pay | Admitting: Family Medicine

## 2014-12-20 ENCOUNTER — Ambulatory Visit (INDEPENDENT_AMBULATORY_CARE_PROVIDER_SITE_OTHER): Payer: 59 | Admitting: Family Medicine

## 2014-12-20 VITALS — BP 112/60 | HR 64 | Wt 290.2 lb

## 2014-12-20 DIAGNOSIS — M79674 Pain in right toe(s): Secondary | ICD-10-CM

## 2014-12-20 DIAGNOSIS — N3945 Continuous leakage: Secondary | ICD-10-CM

## 2014-12-20 LAB — POCT URINALYSIS DIPSTICK
Bilirubin, UA: NEGATIVE
Blood, UA: NEGATIVE
Ketones, UA: NEGATIVE
Leukocytes, UA: NEGATIVE
Nitrite, UA: NEGATIVE
Protein, UA: NEGATIVE
Spec Grav, UA: >=1.03
Urobilinogen, UA: NEGATIVE
pH, UA: 6

## 2014-12-20 NOTE — Progress Notes (Signed)
   Subjective:    Patient ID: DREYTON SESLER, male    DOB: Apr 04, 1956, 59 y.o.   MRN: [id number]  HPI  He is here for 2 issues. He complains of a several month history of urinary leakage. He was apparently not having difficulty prior to this and now he leaks fairly regularly whether he is lying in bed, working or moving around. Nothing in particular causes him to have urinary leakage. No dysuria, discharge,hesitancy, decreased stream. He does have underlying diabetes. He is followed by endocrinology for this. He also complains of right great toe pain. The pain is especially bothersome when he is up walking around. He is noted no redness, swelling or acute tenderness. No other joints are involved.   Review of Systems     Objective:   Physical Exam Urine dipstick was negative except for glucose. Great toe exam does show slight tenderness palpation but no edema is noted.       Assessment & Plan:  Continuous leakage of urine - Plan: Urinalysis Dipstick, Ambulatory referral to Urology  Great toe pain, right - Plan: DG Toe Great Right I explained that his toe pain is probably arthritis in nature and probably not gout. His urologic symptoms do not sound like BPH since he is really having no obstructive type symptoms.

## 2014-12-23 ENCOUNTER — Ambulatory Visit (HOSPITAL_BASED_OUTPATIENT_CLINIC_OR_DEPARTMENT_OTHER): Payer: 59 | Attending: Family Medicine | Admitting: Radiology

## 2014-12-23 VITALS — Ht 71.0 in | Wt 290.0 lb

## 2014-12-23 DIAGNOSIS — G4733 Obstructive sleep apnea (adult) (pediatric): Secondary | ICD-10-CM | POA: Diagnosis not present

## 2014-12-23 DIAGNOSIS — G4730 Sleep apnea, unspecified: Secondary | ICD-10-CM

## 2014-12-23 DIAGNOSIS — G4700 Insomnia, unspecified: Secondary | ICD-10-CM | POA: Diagnosis present

## 2014-12-23 DIAGNOSIS — G4761 Periodic limb movement disorder: Secondary | ICD-10-CM | POA: Diagnosis not present

## 2014-12-24 ENCOUNTER — Ambulatory Visit
Admission: RE | Admit: 2014-12-24 | Discharge: 2014-12-24 | Disposition: A | Discharge status: Home / Self Care | Payer: 59 | Source: Ambulatory Visit | Attending: Family Medicine | Admitting: Family Medicine

## 2014-12-24 ENCOUNTER — Other Ambulatory Visit: Payer: Self-pay | Admitting: Family Medicine

## 2014-12-24 DIAGNOSIS — M79674 Pain in right toe(s): Secondary | ICD-10-CM

## 2014-12-31 DIAGNOSIS — G4733 Obstructive sleep apnea (adult) (pediatric): Secondary | ICD-10-CM | POA: Diagnosis not present

## 2014-12-31 DIAGNOSIS — G4730 Sleep apnea, unspecified: Secondary | ICD-10-CM | POA: Diagnosis not present

## 2014-12-31 NOTE — Progress Notes (Signed)
   NAME: [name] DATE OF BIRTH:  1955-08-31 MEDICAL RECORD NUMBER [id number]  LOCATION: [facility]  PHYSICIAN: YOUNG,CLINTON D  DATE OF STUDY: 12/23/2014  SLEEP STUDY TYPE: Nocturnal Polysomnogram               REFERRING PHYSICIAN: Ronnald NianLalonde, John C, MD  INDICATION FOR STUDY:  Insomnia with sleep apnea  EPWORTH SLEEPINESS SCORE:   10/24 HEIGHT: 5\' 11"  (180.3 cm)  WEIGHT: 290 lb (131.543 kg)    Body mass index is 40.46 kg/(m^2).  NECK SIZE: 17 in.  MEDICATIONS: Charted for review  SLEEP ARCHITECTURE: Total sleep time 119.5 minutes with sleep efficiency 34.1%. Stage I was 33.9%, stage II 66.1%, stage III and REM were absent. Sleep latency 83.5 minutes, awake after sleep onset 149.5 minutes, arousal index 33.1, bedtime medication: None. Sustained sleep onset was delayed until 1:30 AM.  RESPIRATORY DATA: Apnea hypopnea index (AHI) 22.1 per hour. 44 total events scored including 11 obstructive apneas, 33 hypopneas. All events were while nonsupine. He did not have enough early sleep or respiratory events to qualify for split protocol CPAP titration.  OXYGEN DATA: Moderate snoring with oxygen desaturation to a nadir of 81% and mean saturation 95.1% on room air  CARDIAC DATA: Sinus rhythm with PVCs and PACs  MOVEMENT/PARASOMNIA: Periodic limb movement syndrome with arousal. 355 total limb jerks were counted of which 29 were associated with arousal or awakening for periodic limb movement with arousal index of 14.6 per hour. Bathroom 2  IMPRESSION/ RECOMMENDATION:   1) Significant delay in sustain sleep onset until 1:30 AM with frequent awakenings throughout the rest of the night. This may reflect a significant insomnia component although he was sleeping without CPAP and there was substantial sleep disturbance from limb movements. 2) Moderate obstructive sleep apnea/hypopnea syndrome, AHI 22.1 per hour, nonsupine events. Because of delayed sleep onset he did not  accumulate enough early respiratory events to permit application of split protocol CPAP titration. 3) Periodic Limb Movement Syndrome-moderately severe. 355 total limb jerks counted of which 29 were associated with arousal or awakening for a periodic limb movement with arousal index of 14.6 per hour. Some of these events may represent arousal because of respiratory disturbance in which case improvement would be expected with successful CPAP control. Otherwise specific therapy such as Requip or Mirapex might be considered if clinically appropriate.   Waymon BudgeYOUNG,CLINTON D Diplomate, American Board of Sleep Medicine  ELECTRONICALLY SIGNED ON:  12/31/2014, 2:53 PM  [facility] PH: [phone]   FX: [phone] ACCREDITED BY THE AMERICAN ACADEMY OF SLEEP MEDICINE

## 2015-01-04 ENCOUNTER — Telehealth: Payer: Self-pay

## 2015-01-04 NOTE — Telephone Encounter (Signed)
Left message cpap read out looks good to continue use of machine

## 2015-01-12 ENCOUNTER — Telehealth: Payer: Self-pay

## 2015-01-12 NOTE — Telephone Encounter (Signed)
Left message to call me back.

## 2015-01-13 NOTE — Telephone Encounter (Signed)
Pt returned call and he was informed of his sleep study and he asked for me to send referral to [facility] for cpap

## 2015-02-14 ENCOUNTER — Telehealth: Payer: Self-pay | Admitting: Family Medicine

## 2015-02-14 NOTE — Telephone Encounter (Signed)
Pt called and needs cpap setting changed per [facility], [facility] faxing over setting today.

## 2015-02-15 NOTE — Telephone Encounter (Signed)
See if you can set him up for auto titrate

## 2015-02-15 NOTE — Telephone Encounter (Signed)
Pt called again and was wanting his cpap settings prescription changed.pt is [facility]ses [facility] and there number is [phone] ext number [phone]

## 2015-02-16 NOTE — Telephone Encounter (Signed)
Left message for pt that I have faxed over order to [facility] per [facility] for CPAP to be changed to Autotitration

## 2015-02-21 ENCOUNTER — Telehealth: Payer: Self-pay

## 2015-02-21 NOTE — Telephone Encounter (Signed)
I need more information. Find out from him and the CPAP company what's going on

## 2015-02-21 NOTE — Telephone Encounter (Signed)
Dr.lalonde I sent in the rx for auto titration as you said

## 2015-02-21 NOTE — Telephone Encounter (Signed)
This  was supposed be set up on auto CPAP.

## 2015-02-21 NOTE — Telephone Encounter (Signed)
Pt called saying her CPAP mask blows air out into her eyes at night and wakes her up. The air concentration is dialed at about 20-22 and she wants to know if you could dial it back to about 17-18. Her machine is with [facility].

## 2015-02-22 NOTE — Telephone Encounter (Signed)
I have taken care of this

## 2015-02-28 ENCOUNTER — Telehealth: Payer: Self-pay | Admitting: Family Medicine

## 2015-02-28 NOTE — Telephone Encounter (Signed)
Fix it 

## 2015-02-28 NOTE — Telephone Encounter (Signed)
Dr.Lalonde what the patient wants to know is one week on the trial setting okay with you instead of two weeks

## 2015-02-28 NOTE — Telephone Encounter (Signed)
Let's do two weeks

## 2015-02-28 NOTE — Telephone Encounter (Signed)
pts cpap machine was not set up right until this pass this Friday and he was suppose to have 2 weeks worth but only had a week and is suppose to turn his cpap machine  back in this Friday, wanted to make sure one weeks worth of med was ok. [facility] said one week was ok but he wanted to make sure it was ok with you first or you would have to give him another pres for it.  Pt can be reached at [phone] (W), thanks so much

## 2015-03-01 NOTE — Telephone Encounter (Signed)
I FAXED UPDATED REFERRAL FOR TOTAL OF TWO WEEKS NOT ONE

## 2015-03-14 ENCOUNTER — Telehealth: Payer: Self-pay | Admitting: Family Medicine

## 2015-03-14 ENCOUNTER — Other Ambulatory Visit: Payer: Self-pay

## 2015-03-14 DIAGNOSIS — M79675 Pain in left toe(s): Secondary | ICD-10-CM

## 2015-03-14 NOTE — Telephone Encounter (Signed)
Order put in epic for podiatry

## 2015-03-14 NOTE — Telephone Encounter (Signed)
Pt says that Dr Susann Givens mentioned that he would refer him to a specialist for his left foot issues when he was ready and pt is ready now

## 2015-03-14 NOTE — Telephone Encounter (Signed)
Send him to a foot specialist

## 2015-03-15 ENCOUNTER — Telehealth: Payer: Self-pay | Admitting: Family Medicine

## 2015-03-15 NOTE — Telephone Encounter (Signed)
Take care of this 

## 2015-03-15 NOTE — Telephone Encounter (Signed)
Already have. 

## 2015-03-15 NOTE — Telephone Encounter (Signed)
Pt dropped off his cpap trend report from [facility]. Please advise pt of new orders and he will come by and pick up. Pt can be reached at [phone].

## 2015-03-29 ENCOUNTER — Ambulatory Visit: Payer: 59 | Admitting: Podiatry

## 2015-04-05 ENCOUNTER — Ambulatory Visit (INDEPENDENT_AMBULATORY_CARE_PROVIDER_SITE_OTHER): Payer: 59 | Admitting: Podiatry

## 2015-04-05 ENCOUNTER — Encounter: Payer: Self-pay | Admitting: Podiatry

## 2015-04-05 ENCOUNTER — Ambulatory Visit (INDEPENDENT_AMBULATORY_CARE_PROVIDER_SITE_OTHER): Payer: 59

## 2015-04-05 VITALS — BP 138/64 | HR 66 | Resp 12

## 2015-04-05 DIAGNOSIS — M79672 Pain in left foot: Secondary | ICD-10-CM | POA: Diagnosis not present

## 2015-04-05 DIAGNOSIS — M205X2 Other deformities of toe(s) (acquired), left foot: Secondary | ICD-10-CM | POA: Diagnosis not present

## 2015-04-05 NOTE — Progress Notes (Signed)
   Subjective:    Patient ID: [name], male    DOB: 01/14/1956, 59 y.o.   MRN: [id number]  HPI: He presents today with chief complaint of pain over the past 3-5 years first metatarsophalangeal joint of the left foot. States that is worsening over that time getting to the point now where he can hardly perform his daily activities. Initially it started out as intermittent pain and now it is more of a consistent basis. He feels that he is not walking correctly and this is limiting his ability to perform his daily activities.    Review of Systems  Eyes: Positive for visual disturbance.  Cardiovascular: Positive for leg swelling.  Neurological: Positive for light-headedness.  Hematological: Bruises/bleeds easily.       Objective:   Physical Exam: 59 year old male vital signs stable alert and oriented 3 no apparent distress. Pulses are strongly palpable bilateral. Neurologic sensorium is intact per Semmes-Weinstein monofilament. Deep tendon reflexes are intact bilateral muscle strength +5 over 5 dorsiflexors and flexors inverters and everters all intrinsic musculature is intact. Orthopedic evaluation of a straight solid joints distal to the ankle for range of motion without crepitation. Cutaneous evaluation demonstrates supple well-hydrated cutis. Orthopedic evaluation does demonstrate severe limitation in range of motion of the first metatarsophalangeal joint of the left foot. Palpable spurring is noted dorsally and medially. Radiographs taken 3 views in the office today demonstrate joint space narrowing and subchondral sclerosis spurring dorsally. No fractures no fusions.          Assessment & Plan:  Hallux limitus first metatarsophalangeal joint left foot.  Plan: Discussed etiology pathology conservative versus surgical therapies. At this point we consented him for a Keller arthroplasty with a single silicone implant first metatarsophalangeal joint of the left foot. I answered all  of his questions regarding this procedure to the best of my ability in layman's terms. He understands that he will be out of work for a period of time while this convalesces. We also discussed the possible postop complications which may include but are not limited to postop pain bleeding swelling infection recurrence need for further surgery overcorrection under correction loss of digit loss of limb loss of life. We dispensed a Cam Walker today for his postop period. And I will follow-up with him the near future for surgical intervention.  Arbutus Ped DPM

## 2015-04-05 NOTE — Patient Instructions (Signed)
Pre-Operative Instructions  Congratulations, you have decided to take an important step to improving your quality of life.  You can be assured that the doctors of [facility] will be with you every step of the way.  1. Plan to be at the surgery center/hospital at least 1 (one) hour prior to your scheduled time unless otherwise directed by the surgical center/hospital staff.  You must have a responsible adult accompany you, remain during the surgery and drive you home.  Make sure you have directions to the surgical center/hospital and know how to get there on time. 2. For hospital based surgery you will need to obtain a history and physical form from your family physician within 1 month prior to the date of surgery- we will give you a form for you primary physician.  3. We make every effort to accommodate the date you request for surgery.  There are however, times where surgery dates or times have to be moved.  We will contact you as soon as possible if a change in schedule is required.   4. No Aspirin/Ibuprofen for one week before surgery.  If you are on aspirin, any non-steroidal anti-inflammatory medications (Mobic, Aleve, Ibuprofen) you should stop taking it 7 days prior to your surgery.  You make take Tylenol  For pain prior to surgery.  5. Medications- If you are taking daily heart and blood pressure medications, seizure, reflux, allergy, asthma, anxiety, pain or diabetes medications, make sure the surgery center/hospital is aware before the day of surgery so they may notify you which medications to take or avoid the day of surgery. 6. No food or drink after midnight the night before surgery unless directed otherwise by surgical center/hospital staff. 7. No alcoholic beverages 24 hours prior to surgery.  No smoking 24 hours prior to or 24 hours after surgery. 8. Wear loose pants or shorts- loose enough to fit over bandages, boots, and casts. 9. No slip on shoes, sneakers are best. 10. Bring  your boot with you to the surgery center/hospital.  Also bring crutches or a walker if your physician has prescribed it for you.  If you do not have this equipment, it will be provided for you after surgery. 11. If you have not been contracted by the surgery center/hospital by the day before your surgery, call to confirm the date and time of your surgery. 12. Leave-time from work may vary depending on the type of surgery you have.  Appropriate arrangements should be made prior to surgery with your employer. 13. Prescriptions will be provided immediately following surgery by your doctor.  Have these filled as soon as possible after surgery and take the medication as directed. 14. Remove nail polish on the operative foot. 15. Wash the night before surgery.  The night before surgery wash the foot and leg well with the antibacterial soap provided and water paying special attention to beneath the toenails and in between the toes.  Rinse thoroughly with water and dry well with a towel.  Perform this wash unless told not to do so by your physician.  Enclosed: 1 Ice pack (please put in freezer the night before surgery)   1 Hibiclens skin cleaner   Pre-op Instructions  If you have any questions regarding the instructions, do not hesitate to call our office.  [place]: [address] [place], [place] [address] [phone]  [place]: [address] [place], [place] [address] [phone]  [place]: [address]  [place], [place] [address] [phone]  Dr. Richard   Tuchman DPM, Dr. Norman Regal DPM Dr. Richard Sikora DPM, Dr. M. Todd Hyatt DPM, Dr. Kathryn Egerton DPM 

## 2015-04-07 ENCOUNTER — Telehealth: Payer: Self-pay | Admitting: *Deleted

## 2015-04-07 ENCOUNTER — Ambulatory Visit (INDEPENDENT_AMBULATORY_CARE_PROVIDER_SITE_OTHER): Payer: 59 | Admitting: Family Medicine

## 2015-04-07 ENCOUNTER — Encounter: Payer: Self-pay | Admitting: Family Medicine

## 2015-04-07 DIAGNOSIS — Z23 Encounter for immunization: Secondary | ICD-10-CM | POA: Diagnosis not present

## 2015-04-07 DIAGNOSIS — G4733 Obstructive sleep apnea (adult) (pediatric): Secondary | ICD-10-CM

## 2015-04-07 NOTE — Telephone Encounter (Signed)
"  I need to reschedule my surgery from 06/03/2015 to a sooner time around November 17."  The only time he has available is November 11.  "I guess go ahead and put me down for then."  Okay, I'll get it switched to November 11.

## 2015-04-07 NOTE — Telephone Encounter (Signed)
"  I just called and rescheduled my surgery to November 11.  I'd like to put it back on for December 2nd.  I tried to call my other doctor's office to reschedule but I couldn't get a hold of anyone.  So, I'm going to just leave everything as it was and deal with it."  Okay, I'll put you back down for 06/03/2015.

## 2015-04-07 NOTE — Telephone Encounter (Signed)
"  I'm scheduled to have surgery with Dr. Al Corpus on December 2nd.  I need to reschedule.  I'm having hand surgery.  It was scheduled for November 22nd but they have rescheduled it to the November 17th.  I'd like to move this one closer to my hand surgery so I can overlap the time off."

## 2015-04-07 NOTE — Progress Notes (Signed)
   Subjective:    Patient ID: [name], male    DOB: 09-11-1955, 59 y.o.   MRN: [id number]  HPI He is here for consult. He does have OSA which was diagnosed in June. He is now on CPAP. He was originally titrated however presently is on 14 cm and apparently doing well. He has no particular concerns or complaints. He has been on this level for approximately one month. He does have surgery scheduled for CTS as well as for foot surgery.   Review of Systems     Objective:   Physical Exam        Assessment & Plan:  Need for prophylactic vaccination and inoculation against influenza - Plan: Flu Vaccine QUAD 36+ mos IM  Need for prophylactic vaccination against Streptococcus pneumoniae (pneumococcus) - Plan: Pneumococcal conjugate vaccine 13-valent  OSA (obstructive sleep apnea)  instructed him to get a read out and have them send it to me. Strongly encouraged him to get involved in a exercise and weight loss program to help with his overall care. Flu shot given. He will keep the appointments for the impending surgery.

## 2015-04-08 ENCOUNTER — Telehealth: Payer: Self-pay | Admitting: Medical

## 2015-04-08 NOTE — Telephone Encounter (Signed)
LMTCB

## 2015-04-08 NOTE — Telephone Encounter (Signed)
Pt states he has had this cyst for 40 years. It comes an goes, Dr. Susann Givens is who treats it. He had surgery to remove it but they didn't get it all so it still pops up for 4-5 weeks then disappears again. He is having hand surgery this month and foot surgery next month, and the cyst is back right now and he just wanted to know if it could cause him any problems with his surgeries. Said to call him for more questions or with an answer on his work phone which is: [phone]

## 2015-04-08 NOTE — Telephone Encounter (Signed)
Should not be an issue.

## 2015-04-08 NOTE — Telephone Encounter (Signed)
[name] called and was seen yesterday and said he forgot to mention that his cyst had burst again and just wanted to let dr Susann Givens know about it, said this had happened before,

## 2015-04-08 NOTE — Telephone Encounter (Signed)
Please let him know that I'm not sure what he is referring too.  There is no mention in yesterday's note about a cyst.   Where is this located, is he worried about infection, and Dr. Susann Givens is out of the office as of today through next week, so get a little more info.

## 2015-04-11 NOTE — Telephone Encounter (Signed)
Pt informed and verbalized understanding

## 2015-04-28 ENCOUNTER — Other Ambulatory Visit: Payer: Self-pay | Admitting: Medical

## 2015-04-29 NOTE — Telephone Encounter (Signed)
Is this okay to refill? 

## 2015-05-04 ENCOUNTER — Encounter: Payer: Self-pay | Admitting: Podiatry

## 2015-05-04 ENCOUNTER — Telehealth: Payer: Self-pay | Admitting: *Deleted

## 2015-05-04 DIAGNOSIS — M205X2 Other deformities of toe(s) (acquired), left foot: Secondary | ICD-10-CM

## 2015-05-04 NOTE — Telephone Encounter (Signed)
"  I haven't heard anything about my surgery scheduled for 06/03/2015."  You will not hear anything from the surgical center until a day or two before surgery date.  "Is Dr. Al CorpusHyatt my surgeon?  I've only seen him once I just want to make sure."  Yes, Dr. Al CorpusHyatt is your surgeon.  "I need FMLA papers filled out.  Do I bring them to the office?  Will paperwork be faxed, mailed or do I need to pick it up?"  It depends on what you request.

## 2015-05-04 NOTE — Telephone Encounter (Signed)
Pt states he has questions about his tailor bunion implant surgery on 06/02/2015.  Pt asked if he would be able to walk right after surgery, when can he go back to work.and could he get a handicap sticker.  I told pt he would be able to walk immediately after surgery but only about 5 minutes/hr the 1st week, increasing by 5 minutes/hr each post op week if Dr. Al CorpusHyatt felt it was appropriate.  I told him Dr. Al CorpusHyatt would not release him to go back to work until after the 1st POV evaluation, and that we would write him for a 6 month handicap sticker to begin 06/02/2015, that he could pick [facility] and [facility] closer to the surgery date.  Pt states understanding.

## 2015-05-05 ENCOUNTER — Telehealth: Payer: Self-pay | Admitting: *Deleted

## 2015-05-05 NOTE — Telephone Encounter (Signed)
Pt states he will have surgery beginning of 06/2015 and has a 2 story house and needs a order for a bed for downstairs and a knee scooter.  Pt gave his insurance phone [phone].

## 2015-05-09 ENCOUNTER — Telehealth: Payer: Self-pay | Admitting: Family Medicine

## 2015-05-09 NOTE — Telephone Encounter (Signed)
Left message for pt. To let him know that cpap compliance report looked great.

## 2015-05-12 NOTE — Telephone Encounter (Signed)
I called and left him a message that he will not need a bed nor knee scooter for this procedure.  You will be able to walk.  Call if you have any further questions.

## 2015-05-13 ENCOUNTER — Encounter (HOSPITAL_BASED_OUTPATIENT_CLINIC_OR_DEPARTMENT_OTHER): Payer: Self-pay | Admitting: *Deleted

## 2015-05-16 ENCOUNTER — Telehealth: Payer: Self-pay | Admitting: *Deleted

## 2015-05-16 NOTE — Telephone Encounter (Signed)
"  I'm having foot surgery to have a joint replaced December 2nd.  I had asked that a claim be submitted to my insurance company, [facility] [employer], for a bed and a knee walker or 2 knee walkers.  I live alone.  Give me a call please.  Also I received a message that I needed to call back anyway.  Thank you.  I'm returning your call.  Dr. Al CorpusHyatt said you will not need a scooter nor a bed.  You will be able to walk with no problems.  You'll be wearing an air fracture walker.  "Okay, if I don't need it, that's better.  I won't have to deal with all this.  I have this soap thing to clean my foot.  Does it make sense to clean it before I come in because I'm going to put a sock on my foot and a shoe?"  They will scrub your foot prior to having the surgery as well.

## 2015-05-17 ENCOUNTER — Telehealth: Payer: Self-pay | Admitting: *Deleted

## 2015-05-17 NOTE — Telephone Encounter (Signed)
Authorization was obtained for surgery scheduled for 06/03/2015 for CPT [id number] by [facility].  Authorization number is [id number].  Authorization was faxed to Aram [facility]Cynthia at [facility].

## 2015-05-19 ENCOUNTER — Ambulatory Visit (HOSPITAL_BASED_OUTPATIENT_CLINIC_OR_DEPARTMENT_OTHER): Payer: 59 | Admitting: Anesthesiology

## 2015-05-19 ENCOUNTER — Encounter (HOSPITAL_BASED_OUTPATIENT_CLINIC_OR_DEPARTMENT_OTHER)
Admission: RE | Disposition: A | Discharge status: Home / Self Care | Payer: Self-pay | Source: Ambulatory Visit | Attending: Orthopedic Surgery

## 2015-05-19 ENCOUNTER — Encounter (HOSPITAL_BASED_OUTPATIENT_CLINIC_OR_DEPARTMENT_OTHER): Payer: Self-pay | Admitting: *Deleted

## 2015-05-19 ENCOUNTER — Ambulatory Visit (HOSPITAL_BASED_OUTPATIENT_CLINIC_OR_DEPARTMENT_OTHER)
Admission: RE | Admit: 2015-05-19 | Discharge: 2015-05-19 | Disposition: A | Discharge status: Home / Self Care | Payer: 59 | Source: Ambulatory Visit | Attending: Orthopedic Surgery | Admitting: Orthopedic Surgery

## 2015-05-19 DIAGNOSIS — G5601 Carpal tunnel syndrome, right upper limb: Secondary | ICD-10-CM | POA: Insufficient documentation

## 2015-05-19 DIAGNOSIS — I10 Essential (primary) hypertension: Secondary | ICD-10-CM | POA: Insufficient documentation

## 2015-05-19 DIAGNOSIS — Z91040 Latex allergy status: Secondary | ICD-10-CM | POA: Insufficient documentation

## 2015-05-19 DIAGNOSIS — M1389 Other specified arthritis, multiple sites: Secondary | ICD-10-CM | POA: Diagnosis not present

## 2015-05-19 DIAGNOSIS — Z823 Family history of stroke: Secondary | ICD-10-CM | POA: Insufficient documentation

## 2015-05-19 DIAGNOSIS — G4730 Sleep apnea, unspecified: Secondary | ICD-10-CM | POA: Diagnosis not present

## 2015-05-19 DIAGNOSIS — E119 Type 2 diabetes mellitus without complications: Secondary | ICD-10-CM | POA: Diagnosis not present

## 2015-05-19 DIAGNOSIS — E669 Obesity, unspecified: Secondary | ICD-10-CM | POA: Insufficient documentation

## 2015-05-19 DIAGNOSIS — Z794 Long term (current) use of insulin: Secondary | ICD-10-CM | POA: Insufficient documentation

## 2015-05-19 DIAGNOSIS — Z79899 Other long term (current) drug therapy: Secondary | ICD-10-CM | POA: Diagnosis not present

## 2015-05-19 DIAGNOSIS — E785 Hyperlipidemia, unspecified: Secondary | ICD-10-CM | POA: Insufficient documentation

## 2015-05-19 DIAGNOSIS — Z8249 Family history of ischemic heart disease and other diseases of the circulatory system: Secondary | ICD-10-CM | POA: Insufficient documentation

## 2015-05-19 DIAGNOSIS — K219 Gastro-esophageal reflux disease without esophagitis: Secondary | ICD-10-CM | POA: Diagnosis not present

## 2015-05-19 DIAGNOSIS — Z7982 Long term (current) use of aspirin: Secondary | ICD-10-CM | POA: Insufficient documentation

## 2015-05-19 DIAGNOSIS — F418 Other specified anxiety disorders: Secondary | ICD-10-CM | POA: Insufficient documentation

## 2015-05-19 DIAGNOSIS — Z833 Family history of diabetes mellitus: Secondary | ICD-10-CM | POA: Diagnosis not present

## 2015-05-19 HISTORY — DX: Anxiety disorder, unspecified: F41.9

## 2015-05-19 HISTORY — PX: CARPAL TUNNEL RELEASE: SHX101

## 2015-05-19 LAB — POCT I-STAT, CHEM 8
BUN: 14 mg/dL (ref 6–20)
Calcium, Ion: 1.15 mmol/L (ref 1.12–1.23)
Chloride: 103 mmol/L (ref 101–111)
Creatinine, Ser: 0.9 mg/dL (ref 0.61–1.24)
Glucose, Bld: 173 mg/dL — ABNORMAL HIGH (ref 65–99)
HCT: 38 % — ABNORMAL LOW (ref 39.0–52.0)
Hemoglobin: 12.9 g/dL — ABNORMAL LOW (ref 13.0–17.0)
Potassium: 4 mmol/L (ref 3.5–5.1)
Sodium: 139 mmol/L (ref 135–145)
TCO2: 23 mmol/L (ref 0–100)

## 2015-05-19 LAB — GLUCOSE, CAPILLARY: Glucose-Capillary: 143 mg/dL — ABNORMAL HIGH (ref 65–99)

## 2015-05-19 SURGERY — CARPAL TUNNEL RELEASE
Anesthesia: General | Site: Wrist | Laterality: Left

## 2015-05-19 MED ORDER — LIDOCAINE HCL (CARDIAC) 20 MG/ML IV SOLN
INTRAVENOUS | Status: AC
  Filled 2015-05-19: qty 5

## 2015-05-19 MED ORDER — LACTATED RINGERS IV SOLN: 500.0000 mL | INTRAVENOUS | Status: DC

## 2015-05-19 MED ORDER — SCOPOLAMINE 1 MG/3DAYS TD PT72: 1.0000 | MEDICATED_PATCH | Freq: Once | TRANSDERMAL | Status: DC | PRN

## 2015-05-19 MED ORDER — CHLORHEXIDINE GLUCONATE 4 % EX LIQD: 60.0000 mL | Freq: Once | CUTANEOUS | Status: DC

## 2015-05-19 MED ORDER — CEFAZOLIN SODIUM-DEXTROSE 2-3 GM-% IV SOLR
INTRAVENOUS | Status: AC
  Filled 2015-05-19: qty 50

## 2015-05-19 MED ORDER — ONDANSETRON HCL 4 MG/2ML IJ SOLN
INTRAMUSCULAR | Status: DC | PRN
  Administered 2015-05-19: 4 mg via INTRAVENOUS

## 2015-05-19 MED ORDER — GLYCOPYRROLATE 0.2 MG/ML IJ SOLN: 0.2000 mg | Freq: Once | INTRAMUSCULAR | Status: DC | PRN

## 2015-05-19 MED ORDER — KETOROLAC TROMETHAMINE 30 MG/ML IJ SOLN
INTRAMUSCULAR | Status: AC
  Filled 2015-05-19: qty 1

## 2015-05-19 MED ORDER — MIDAZOLAM HCL 2 MG/2ML IJ SOLN
1.0000 mg | INTRAMUSCULAR | Status: DC | PRN
  Administered 2015-05-19: 2 mg via INTRAVENOUS

## 2015-05-19 MED ORDER — CEFAZOLIN SODIUM 1-5 GM-% IV SOLN
INTRAVENOUS | Status: AC
  Filled 2015-05-19: qty 50

## 2015-05-19 MED ORDER — BUPIVACAINE HCL (PF) 0.25 % IJ SOLN
INTRAMUSCULAR | Status: AC
  Filled 2015-05-19: qty 30

## 2015-05-19 MED ORDER — KETOROLAC TROMETHAMINE 30 MG/ML IJ SOLN
INTRAMUSCULAR | Status: DC | PRN
  Administered 2015-05-19: 30 mg via INTRAVENOUS

## 2015-05-19 MED ORDER — LIDOCAINE HCL (CARDIAC) 20 MG/ML IV SOLN
INTRAVENOUS | Status: DC | PRN
  Administered 2015-05-19: 60 mg via INTRAVENOUS

## 2015-05-19 MED ORDER — CEFAZOLIN SODIUM-DEXTROSE 2-3 GM-% IV SOLR
2.0000 g | INTRAVENOUS | Status: AC
  Administered 2015-05-19: 3 g via INTRAVENOUS

## 2015-05-19 MED ORDER — MIDAZOLAM HCL 2 MG/2ML IJ SOLN
INTRAMUSCULAR | Status: AC
  Filled 2015-05-19: qty 2

## 2015-05-19 MED ORDER — ONDANSETRON HCL 4 MG/2ML IJ SOLN
INTRAMUSCULAR | Status: AC
  Filled 2015-05-19: qty 2

## 2015-05-19 MED ORDER — LACTATED RINGERS IV SOLN
INTRAVENOUS | Status: DC
  Administered 2015-05-19: 10 mL/h via INTRAVENOUS

## 2015-05-19 MED ORDER — PROMETHAZINE HCL 25 MG/ML IJ SOLN: 6.2500 mg | INTRAMUSCULAR | Status: DC | PRN

## 2015-05-19 MED ORDER — HYDROCODONE-ACETAMINOPHEN 5-325 MG PO TABS: ORAL_TABLET | ORAL | Status: DC

## 2015-05-19 MED ORDER — DEXAMETHASONE SODIUM PHOSPHATE 10 MG/ML IJ SOLN
INTRAMUSCULAR | Status: AC
  Filled 2015-05-19: qty 1

## 2015-05-19 MED ORDER — FENTANYL CITRATE (PF) 100 MCG/2ML IJ SOLN: 25.0000 ug | INTRAMUSCULAR | Status: DC | PRN

## 2015-05-19 MED ORDER — FENTANYL CITRATE (PF) 100 MCG/2ML IJ SOLN
INTRAMUSCULAR | Status: AC
  Filled 2015-05-19: qty 2

## 2015-05-19 MED ORDER — DEXAMETHASONE SODIUM PHOSPHATE 4 MG/ML IJ SOLN
INTRAMUSCULAR | Status: DC | PRN
  Administered 2015-05-19: 10 mg via INTRAVENOUS

## 2015-05-19 MED ORDER — PROPOFOL 10 MG/ML IV BOLUS
INTRAVENOUS | Status: DC | PRN
  Administered 2015-05-19: 300 mg via INTRAVENOUS

## 2015-05-19 MED ORDER — FENTANYL CITRATE (PF) 100 MCG/2ML IJ SOLN
50.0000 ug | INTRAMUSCULAR | Status: DC | PRN
  Administered 2015-05-19: 75 ug via INTRAVENOUS

## 2015-05-19 MED ORDER — PROPOFOL 500 MG/50ML IV EMUL
INTRAVENOUS | Status: AC
Start: 2015-05-19 — End: 2015-05-19
  Filled 2015-05-19: qty 50

## 2015-05-19 MED ORDER — BUPIVACAINE HCL (PF) 0.25 % IJ SOLN
INTRAMUSCULAR | Status: DC | PRN
  Administered 2015-05-19: 10 mL

## 2015-05-19 SURGICAL SUPPLY — 37 items
BANDAGE ELASTIC 3 VELCRO ST LF (GAUZE/BANDAGES/DRESSINGS) ×2 IMPLANT
BLADE SURG 15 STRL LF DISP TIS (BLADE) ×2 IMPLANT
BLADE SURG 15 STRL SS (BLADE) ×4
BNDG CMPR 9X4 STRL LF SNTH (GAUZE/BANDAGES/DRESSINGS)
BNDG ESMARK 4X9 LF (GAUZE/BANDAGES/DRESSINGS) IMPLANT
BNDG GAUZE ELAST 4 BULKY (GAUZE/BANDAGES/DRESSINGS) ×2 IMPLANT
CHLORAPREP W/TINT 26ML (MISCELLANEOUS) ×2 IMPLANT
CORDS BIPOLAR (ELECTRODE) ×2 IMPLANT
COVER BACK TABLE 60X90IN (DRAPES) ×2 IMPLANT
COVER MAYO STAND STRL (DRAPES) ×2 IMPLANT
CUFF TOURNIQUET SINGLE 18IN (TOURNIQUET CUFF) ×2 IMPLANT
DRAPE EXTREMITY T 121X128X90 (DRAPE) ×2 IMPLANT
DRAPE SURG 17X23 STRL (DRAPES) ×2 IMPLANT
DRSG PAD ABDOMINAL 8X10 ST (GAUZE/BANDAGES/DRESSINGS) ×2 IMPLANT
GAUZE SPONGE 4X4 12PLY STRL (GAUZE/BANDAGES/DRESSINGS) ×2 IMPLANT
GAUZE XEROFORM 1X8 LF (GAUZE/BANDAGES/DRESSINGS) ×2 IMPLANT
GLOVE BIO SURGEON STRL SZ7.5 (GLOVE) ×2 IMPLANT
GLOVE BIOGEL PI IND STRL 6.5 (GLOVE) IMPLANT
GLOVE BIOGEL PI IND STRL 8 (GLOVE) ×1 IMPLANT
GLOVE BIOGEL PI INDICATOR 6.5 (GLOVE) ×1
GLOVE BIOGEL PI INDICATOR 8 (GLOVE) ×1
GLOVE SURG SS PI 7.5 STRL IVOR (GLOVE) ×1 IMPLANT
GOWN STRL REUS W/ TWL LRG LVL3 (GOWN DISPOSABLE) ×1 IMPLANT
GOWN STRL REUS W/TWL LRG LVL3 (GOWN DISPOSABLE) ×2
GOWN STRL REUS W/TWL XL LVL3 (GOWN DISPOSABLE) ×2 IMPLANT
NDL HYPO 25X1 1.5 SAFETY (NEEDLE) IMPLANT
NEEDLE HYPO 25X1 1.5 SAFETY (NEEDLE) IMPLANT
NS IRRIG 1000ML POUR BTL (IV SOLUTION) ×2 IMPLANT
PACK BASIN DAY SURGERY FS (CUSTOM PROCEDURE TRAY) ×2 IMPLANT
PADDING CAST ABS 4INX4YD NS (CAST SUPPLIES) ×1
PADDING CAST ABS COTTON 4X4 ST (CAST SUPPLIES) ×1 IMPLANT
STOCKINETTE 4X48 STRL (DRAPES) ×2 IMPLANT
SUT ETHILON 4 0 PS 2 18 (SUTURE) ×2 IMPLANT
SYR BULB 3OZ (MISCELLANEOUS) ×2 IMPLANT
SYR CONTROL 10ML LL (SYRINGE) IMPLANT
TOWEL OR 17X24 6PK STRL BLUE (TOWEL DISPOSABLE) ×4 IMPLANT
UNDERPAD 30X30 (UNDERPADS AND DIAPERS) ×2 IMPLANT

## 2015-05-19 NOTE — Transfer of Care (Signed)
Immediate Anesthesia Transfer of Care Note  Patient: [name]  Procedure(s) Performed: Procedure(s): LEFT CARPAL TUNNEL RELEASE (Left)  Patient Location: PACU  Anesthesia Type:General  Level of Consciousness: awake and sedated  Airway & Oxygen Therapy: Patient Spontanous Breathing and Patient connected to face mask oxygen  Post-op Assessment: Report given to RN and Post -op Vital signs reviewed and stable  Post vital signs: Reviewed and stable  Last Vitals:  Filed Vitals:   05/19/15 0725  BP: 133/64  Pulse: 73  Temp: 36.7 C  Resp: 20    Complications: No apparent anesthesia complications

## 2015-05-19 NOTE — Discharge Instructions (Addendum)

## 2015-05-19 NOTE — Anesthesia Procedure Notes (Signed)
Procedure Name: LMA Insertion Performed by: York GricePEARSON, Deneen Slager W Pre-anesthesia Checklist: Patient identified, Emergency Drugs available, Suction available and Patient being monitored Patient Re-evaluated:Patient Re-evaluated prior to inductionOxygen Delivery Method: Circle System Utilized Preoxygenation: Pre-oxygenation with 100% oxygen Intubation Type: IV induction Ventilation: Mask ventilation without difficulty LMA: LMA with gastric port inserted LMA Size: 5.0 Number of attempts: 1 Placement Confirmation: positive ETCO2 Tube secured with: Tape Dental Injury: Teeth and Oropharynx as per pre-operative assessment

## 2015-05-19 NOTE — H&P (Signed)
[name] is an 59 y.o. male.   Chief Complaint: left carpal tunnel syndrome HPI: 59 yo rhd male with 2 years numbness and tingling in the hands.  Splints and injections without lasting relief.  Positive nerve conduction studies.  He has had a right carpal tunnel release and wishes to have a left carpal tunnel release.  Past Medical History  Diagnosis Date  . Obesity   . Dyslipidemia   . Arthritis     back, shoulders, left great toe  . Hypertension     under control with med., per pt.; has been on med. x 15-20 yr.  . Insulin dependent diabetes mellitus ([place])   . GERD (gastroesophageal reflux disease)   . Depression   . Dental crowns present   . Carpal tunnel syndrome of right wrist 10/2014  . Nummular eczema   . Sleep apnea     uses CPAP nightly  . Anxiety     Past Surgical History  Procedure Laterality Date  . Roux-en-y procedure  07/28/2007    with lysis of adhesions  . Cyst excision      scalp and back  . Foot surgery Left     revision traumatic amputation of foot  . Esophagogastroduodenoscopy  03/25/2007  . Cardiac catheterization  12/30/2006    "mod. pulmonary HTN with preserved cardiac output and cardiac index"  . Carpal tunnel release Right 10/14/2014    Procedure: RIGHT CARPAL TUNNEL RELEASE;  Surgeon: Leanora Cover, MD;  Location: [facility];  Service: Orthopedics;  Laterality: Right;    Family History  Problem Relation Age of Onset  . Heart disease Mother   . Cancer Mother 40    Multiple myeloma  . Diabetes Father   . Diabetes Brother   . Heart disease Brother 79    cardiomyopathy  . Arthritis Paternal Grandmother   . Diabetes Paternal Grandmother   . Heart disease Paternal Grandmother   . Stroke Paternal Grandmother   . Heart disease Brother 69    MI   Social History:  reports that he quit smoking about 7 years ago. He has never used smokeless tobacco. He reports that he drinks alcohol. He reports that he does not use illicit  drugs.  Allergies:  Allergies  Allergen Reactions  . Latex Itching    Facility-administered medications prior to admission  Medication Dose Route Frequency Provider Last Rate Last Dose  . lidocaine (PF) (XYLOCAINE) 1 % injection 2 mL  2 mL Intradermal Once Denita Lung, MD       Medications Prior to Admission  Medication Sig Dispense Refill  . aspirin EC 81 MG tablet Take 81 mg by mouth at bedtime.    Marland Kitchen azelastine (ASTELIN) 0.1 % nasal spray Use 1 spray in each nostril at bedtime 30 mL 11  . Blood Glucose Monitoring Suppl (ONETOUCH VERIO IQ SYSTEM) W/DEVICE KIT 1 each by Does not apply route daily. 1 kit 0  . buPROPion (WELLBUTRIN SR) 200 MG 12 hr tablet Take 200 mg by mouth 2 (two) times daily.      . calcium-vitamin D 250-100 MG-UNIT per tablet Take 1 tablet by mouth 2 (two) times daily.      . canagliflozin (INVOKANA) 100 MG TABS tablet Take 150 mg by mouth.     . cetirizine (ZYRTEC) 10 MG tablet Take 10 mg by mouth daily.      Marland Kitchen desvenlafaxine (PRISTIQ) 100 MG 24 hr tablet Take 100 mg by mouth daily.    Marland Kitchen  insulin glargine (LANTUS) 100 UNIT/ML injection Inject 60 Units into the skin at bedtime.     Marland Kitchen lisinopril (PRINIVIL,ZESTRIL) 10 MG tablet Take 10 mg by mouth daily.      . metFORMIN (GLUCOPHAGE) 1000 MG tablet Take 1,000 mg by mouth 2 (two) times daily with a meal.      . mirabegron ER (MYRBETRIQ) 50 MG TB24 tablet Take 50 mg by mouth daily.    Marland Kitchen omeprazole (PRILOSEC) 40 MG capsule Take 1 capsule by mouth  daily 90 capsule 4  . B Complex-C (B-COMPLEX WITH VITAMIN C) tablet Take 1 tablet by mouth 2 (two) times daily.    . Multiple Vitamins-Minerals (MULTIVITAMIN WITH MINERALS) tablet Take 1 tablet by mouth 2 (two) times daily.      . simvastatin (ZOCOR) 40 MG tablet Take 40 mg by mouth daily.      Results for orders placed or performed during the hospital encounter of 05/19/15 (from the past 48 hour(s))  I-STAT, chem 8     Status: Abnormal   Collection Time: 05/19/15  7:30 AM   Result Value Ref Range   Sodium 139 135 - 145 mmol/L   Potassium 4.0 3.5 - 5.1 mmol/L   Chloride 103 101 - 111 mmol/L   BUN 14 6 - 20 mg/dL   Creatinine, Ser 0.90 0.61 - 1.24 mg/dL   Glucose, Bld 173 (H) 65 - 99 mg/dL   Calcium, Ion 1.15 1.12 - 1.23 mmol/L   TCO2 23 0 - 100 mmol/L   Hemoglobin 12.9 (L) 13.0 - 17.0 g/dL   HCT 38.0 (L) 39.0 - 52.0 %    No results found.   A comprehensive review of systems was negative except for: Eyes: positive for contacts/glasses Hematologic/lymphatic: positive for easy bruising Musculoskeletal: positive for arthralgias Neurological: positive for gait problems and headaches Behavioral/Psych: positive for depression and sleep disturbance  Blood pressure 133/64, pulse 73, temperature 98.1 F (36.7 C), temperature source Oral, resp. rate 20, height 5' 11"  (1.803 m), weight 130.636 kg (288 lb), SpO2 99 %.  General appearance: alert, cooperative and appears stated age Head: Normocephalic, without obvious abnormality, atraumatic Neck: supple, symmetrical, trachea midline Resp: clear to auscultation bilaterally Cardio: regular rate and rhythm GI: nontender Extremities: intact sensation and capillary refill all digits.  +epl/fpl/io.  no wounds. Pulses: 2+ and symmetric Skin: Skin color, texture, turgor normal. No rashes or lesions Neurologic: Grossly normal Incision/Wound: none  Assessment/Plan Left carpal tunnel syndrome.  Non operative and operative treatment options were discussed with the patient and patient wishes to proceed with operative treatment. Risks, benefits, and alternatives of surgery were discussed and the patient agrees with the plan of care.   [name] 05/19/2015, 8:33 AM

## 2015-05-19 NOTE — Brief Op Note (Addendum)
05/19/2015  9:12 AM  PATIENT:  [name]  59 y.o. male  PRE-OPERATIVE DIAGNOSIS:  LEFT CARPAL TUNNEL SYNDROME  POST-OPERATIVE DIAGNOSIS:  LEFT CARPAL TUNNEL SYNDROME  PROCEDURE:  Procedure(s): LEFT CARPAL TUNNEL RELEASE (Left)  SURGEON:  Surgeon(s) and Role:    * Betha LoaKevin Shenelle Klas, MD - Primary  PHYSICIAN ASSISTANT:   ASSISTANTS: none   ANESTHESIA:   general  EBL:  Total I/O In: 1000 [I.V.:1000] Out: -   BLOOD ADMINISTERED:none  DRAINS: none   LOCAL MEDICATIONS USED:  MARCAINE     SPECIMEN:  No Specimen  DISPOSITION OF SPECIMEN:  N/A  COUNTS:  YES  TOURNIQUET:  Left arm: 14 minutes at 250 mmHg  DICTATION: .Other Dictation: Dictation Number [id number]  PLAN OF CARE: Discharge to home after PACU  PATIENT DISPOSITION:  PACU - hemodynamically stable.   Addendum 06/08/15: tourniquet changed to left to indicate correct laterality.

## 2015-05-19 NOTE — Op Note (Addendum)
[name], ERICSSON NO.:  [id number]  MEDICAL RECORD NO.:  [id number]  LOCATION:                               FACILITY:  [facility]  PHYSICIAN:  Betha Loa, MD        DATE OF BIRTH:  03-05-1956  DATE OF PROCEDURE:  05/19/2015 DATE OF DISCHARGE:  05/19/2015                              OPERATIVE REPORT   PREOPERATIVE DIAGNOSIS:  Left carpal tunnel syndrome.  POSTOPERATIVE DIAGNOSIS:  Left carpal tunnel syndrome.  PROCEDURE:  Left carpal tunnel release.  SURGEON:  Betha Loa, MD  ASSISTANT:  None.  ANESTHESIA:  General.  IV FLUIDS:  Per Anesthesia flow sheet.  ESTIMATED BLOOD LOSS:  Minimal.  COMPLICATIONS:  None.  SPECIMENS:  None.  TOURNIQUET TIME:  14 minutes.  DISPOSITION:  Stable to PACU.  INDICATIONS:  [name] is a 59 year old, right-hand-dominant male, who has had some tingling sensation in the fingers for many years.  He has had a right carpal tunnel release.  He has been happy with this.  He wished to have a left carpal tunnel release.  Risks, benefits, and alternatives of surgery were discussed including risk of blood loss; infection; damage to nerves, vessels, tendons, ligaments, bone; failure of surgery; need for additional surgery; complications with wound healing, continued pain, recurrence of carpal tunnel, and damage to motor branch.  He voiced understanding of these risks and elected to proceed.  OPERATIVE COURSE:  After being identified preoperatively by myself, the patient and I agreed upon procedure and site procedure.  Surgical site was marked.  The risks, benefits, and alternatives of surgery were reviewed and he wished to proceed.  Surgical consent had been signed. He was given IV Ancef as preoperative antibiotic prophylaxis.  He was transferred to the operating room and placed on the operating room table in supine position with left upper extremity on arm board.  General anesthesia was induced by Anesthesiology.   Left upper extremity was prepped and draped in normal sterile orthopedic fashion.  Surgical pause was performed between surgeons, anesthesia, and operating room staff, and all were in agreement as to patient, procedure, and site procedure. Tourniquet at the proximal aspect of the extremity was inflated to 250 mmHg after exsanguination of limb with an Esmarch bandage.  Incision was made over the transverse carpal ligament and carried into subcutaneous tissues by spreading technique.  Bipolar electrocautery was used to obtain hemostasis.  The palmar fracture was sharply incised.  The transverse carpal ligament was identified.  It was sharply incised.  It was incised distally first.  Care was taken to ensure complete decompression distally.  It was then incised proximally.  Scissors were used to split the distal aspect of the volar antebrachial fascia.  A finger was placed into the wound to ensure complete decompression which was the case.  The nerve was inspected.  It was adherent to the radial leaflet.  The motor branch was identified and was intact.  The wound was copiously irrigated with sterile saline.  It was closed with 4-0 nylon in a  horizontal mattress fashion.  It was injected with 10 mL of 0.25% plain Marcaine to aid in postoperative analgesia.  It was then  dressed with sterile Xeroform, 4x4, ABD, and wrapped with Kerlix and Ace bandage.  Tourniquet was deflated at 14 minutes.  Fingertips were pink with brisk capillary refill after deflation of tourniquet.  Operative drapes were broken down.  The patient was awoken from anesthesia safely. He was transferred back to stretcher and taken to PACU in stable condition.  I will see him back in the office in 1 week for postoperative followup.  I will give him Norco 5/325, 1-2 p.o. q.6 hours p.r.n. pain, dispensed #30.     Betha LoaKevin Kei Mcelhiney, MD     KK/MEDQ  D:  05/19/2015  T:  05/19/2015  Job:  [id number]  Addendum 06/08/15: Diagnoses and  Procedure changed to left to indicate the correct laterality.

## 2015-05-19 NOTE — Anesthesia Postprocedure Evaluation (Signed)
  Anesthesia Post-op Note  Patient: [name]  Procedure(s) Performed: Procedure(s) (LRB): LEFT CARPAL TUNNEL RELEASE (Left)  Patient Location: PACU  Anesthesia Type: General  Level of Consciousness: awake and alert   Airway and Oxygen Therapy: Patient Spontanous Breathing  Post-op Pain: mild  Post-op Assessment: Post-op Vital signs reviewed, Patient's Cardiovascular Status Stable, Respiratory Function Stable, Patent Airway and No signs of Nausea or vomiting  Last Vitals:  Filed Vitals:   05/19/15 0930  BP: 136/84  Pulse: 78  Temp:   Resp: 18    Post-op Vital Signs: stable   Complications: No apparent anesthesia complications

## 2015-05-19 NOTE — Anesthesia Preprocedure Evaluation (Signed)
Anesthesia Evaluation  Patient identified by MRN, date of birth, ID band  Reviewed: Allergy & Precautions, NPO status , Patient's Chart, lab work & pertinent test results  Airway Mallampati: II  TM Distance: >3 FB Neck ROM: Full    Dental  (+) Teeth Intact, Dental Advisory Given   Pulmonary sleep apnea and Continuous Positive Airway Pressure Ventilation , former smoker,    Pulmonary exam normal        Cardiovascular hypertension, Pt. on medications Normal cardiovascular exam     Neuro/Psych Anxiety Depression    GI/Hepatic Neg liver ROS, GERD  Medicated,  Endo/Other  diabetes, Insulin Dependent  Renal/GU negative Renal ROS     Musculoskeletal  (+) Arthritis ,   Abdominal   Peds  Hematology   Anesthesia Other Findings   Reproductive/Obstetrics                             Anesthesia Physical  Anesthesia Plan  ASA: III  Anesthesia Plan: General   Post-op Pain Management:    Induction: Intravenous  Airway Management Planned: LMA  Additional Equipment:   Intra-op Plan:   Post-operative Plan: Extubation in OR  Informed Consent: I have reviewed the patients History and Physical, chart, labs and discussed the procedure including the risks, benefits and alternatives for the proposed anesthesia with the patient or authorized representative who has indicated his/her understanding and acceptance.   Dental advisory given  Plan Discussed with: CRNA, Anesthesiologist and Surgeon  Anesthesia Plan Comments: (Previously awoke during the procedure and was in pain, likely was sub optimal bier block due to patient specific anatomy, will perform GA with LMA)        Anesthesia Quick Evaluation

## 2015-05-19 NOTE — Op Note (Signed)
[id number] 

## 2015-05-20 ENCOUNTER — Encounter (HOSPITAL_BASED_OUTPATIENT_CLINIC_OR_DEPARTMENT_OTHER): Payer: Self-pay | Admitting: Orthopedic Surgery

## 2015-05-24 ENCOUNTER — Ambulatory Visit (HOSPITAL_BASED_OUTPATIENT_CLINIC_OR_DEPARTMENT_OTHER): Admit: 2015-05-24 | Payer: 59 | Admitting: Orthopedic Surgery

## 2015-05-24 ENCOUNTER — Encounter (HOSPITAL_BASED_OUTPATIENT_CLINIC_OR_DEPARTMENT_OTHER): Payer: Self-pay

## 2015-05-24 SURGERY — CARPAL TUNNEL RELEASE
Anesthesia: Choice | Laterality: Left

## 2015-05-25 DIAGNOSIS — G5600 Carpal tunnel syndrome, unspecified upper limb: Secondary | ICD-10-CM | POA: Insufficient documentation

## 2015-06-02 ENCOUNTER — Other Ambulatory Visit: Payer: Self-pay | Admitting: Podiatry

## 2015-06-02 MED ORDER — CEPHALEXIN 500 MG PO CAPS: 500.0000 mg | ORAL_CAPSULE | Freq: Three times a day (TID) | ORAL | Status: DC

## 2015-06-02 MED ORDER — PROMETHAZINE HCL 25 MG PO TABS: 25.0000 mg | ORAL_TABLET | Freq: Three times a day (TID) | ORAL | Status: DC | PRN

## 2015-06-02 MED ORDER — OXYCODONE-ACETAMINOPHEN 10-325 MG PO TABS: ORAL_TABLET | ORAL | Status: DC

## 2015-06-03 ENCOUNTER — Encounter: Payer: Self-pay | Admitting: Podiatry

## 2015-06-03 DIAGNOSIS — M2012 Hallux valgus (acquired), left foot: Secondary | ICD-10-CM | POA: Diagnosis not present

## 2015-06-06 ENCOUNTER — Telehealth: Payer: Self-pay | Admitting: *Deleted

## 2015-06-06 NOTE — Telephone Encounter (Signed)
Pt states the pain is getting worse, not better.  Pt complains of sharp, throbbing pain, and he is up on the foot 15 minutes /hour.  I told pt to back off on the weight bearing time/hour, to closer to 5-7 minutes/hour, no dangling, no walking or sleeping with out the surgical shoe.  Instructed pt to remove boot, open-ended sock and ace, elevate foot for 15 minutes and may ice, then put foot level and rewrap the ace looser, replace the sock and boot.  I told pt to remain in the boot at all times, but may open surgical shoe and rotate foot and ankle, but must sleep and walk in boot.

## 2015-06-08 NOTE — Telephone Encounter (Signed)
"  I need some more documentation for my employer.  I had to fill out a leave of absence form.  They need to get documentation of the medical necessity of my leave absence with a date I might be able to return.  I think the latest one you sent out was 08/18/2014.  It needs to be sent to my employer.  I need to get that to them by next Wednesday.  I can come by to pick it up or you can fax it or mail it."

## 2015-06-09 ENCOUNTER — Ambulatory Visit (INDEPENDENT_AMBULATORY_CARE_PROVIDER_SITE_OTHER): Payer: 59 | Admitting: Podiatry

## 2015-06-09 ENCOUNTER — Ambulatory Visit (INDEPENDENT_AMBULATORY_CARE_PROVIDER_SITE_OTHER): Payer: 59

## 2015-06-09 ENCOUNTER — Encounter: Payer: Self-pay | Admitting: Podiatry

## 2015-06-09 VITALS — BP 151/63 | HR 79 | Resp 16

## 2015-06-09 DIAGNOSIS — Z9889 Other specified postprocedural states: Secondary | ICD-10-CM

## 2015-06-09 DIAGNOSIS — M205X2 Other deformities of toe(s) (acquired), left foot: Secondary | ICD-10-CM

## 2015-06-09 MED ORDER — OXYCODONE-ACETAMINOPHEN 10-325 MG PO TABS: 1.0000 | ORAL_TABLET | Freq: Four times a day (QID) | ORAL | Status: DC | PRN

## 2015-06-09 NOTE — Progress Notes (Signed)
[name] presents today for his first postop visit, status post [name] arthroplasty with a single silicone implant and grommets left foot date of surgery 06/03/2015. He denies fever chills nausea vomiting muscle aches pains. States that he has only been wearing the [employer] Walker. He states that he is Dry and elevated.  Objective: Vital signs are stable he's alert and oriented 3.he presents in his Cam Walker with a dry sterile compressive dressing ambulating. Once the dressing was removed demonstrates no erythema mild edema no cellulitis drainage or odor sutures are intact margins are well coapted great range of motion on active and passive range of motion first metatarsophalangeal joint left foot. Radiographs taken today 3 views in the office demonstrates mild edema dorsally within the tissues. Also does demonstrate a well-placed Keller arthroplasty with a single silicone implant and grommets.  Assessment: Well-healing surgical foot left status post Keller arthroplasty single silicone implant date of surgery 06/03/2015.  Plan: [place] today dry sterile compressive dressing and encouraged range of motion exercises. I demonstrated the range of motion exercises to him and I will follow-up with him in 1 week. He is to keep this dressing dry and clean and keep the foot elevated.

## 2015-06-16 ENCOUNTER — Encounter: Payer: Self-pay | Admitting: Podiatry

## 2015-06-16 ENCOUNTER — Ambulatory Visit (INDEPENDENT_AMBULATORY_CARE_PROVIDER_SITE_OTHER): Payer: 59 | Admitting: Podiatry

## 2015-06-16 VITALS — BP 121/68 | HR 84 | Resp 16

## 2015-06-16 DIAGNOSIS — I10 Essential (primary) hypertension: Secondary | ICD-10-CM

## 2015-06-16 DIAGNOSIS — I152 Hypertension secondary to endocrine disorders: Secondary | ICD-10-CM | POA: Insufficient documentation

## 2015-06-16 DIAGNOSIS — M205X2 Other deformities of toe(s) (acquired), left foot: Secondary | ICD-10-CM | POA: Diagnosis not present

## 2015-06-16 DIAGNOSIS — K219 Gastro-esophageal reflux disease without esophagitis: Secondary | ICD-10-CM | POA: Insufficient documentation

## 2015-06-16 DIAGNOSIS — E1159 Type 2 diabetes mellitus with other circulatory complications: Secondary | ICD-10-CM | POA: Insufficient documentation

## 2015-06-16 DIAGNOSIS — Z9889 Other specified postprocedural states: Secondary | ICD-10-CM

## 2015-06-16 DIAGNOSIS — L603 Nail dystrophy: Secondary | ICD-10-CM

## 2015-06-19 NOTE — Progress Notes (Signed)
He presents today for his second postop visit status post Keller arthroplasty first metatarsophalangeal joint with grommets right foot date of surgery 06/03/2015. He states he really hasn't been hurting he's only been taking ibuprofen.   Objective: Vital signs are stable he is alert and oriented 3. Dry sterile compressive dressing was intact. Minimal edema no erythema cellulitis drainage or odor. Good range of motion of the first metatarsophalangeal joint. No signs of infection.  Assessment: well-healing surgical foot right. Status post Keller arthroplasty right foot.  Plan: placed him in a compression anklet today in a Darco shoe encouraged range of motion exercises and demonstrated that to him. I will follow-up with him in 2 weeks.

## 2015-06-20 ENCOUNTER — Telehealth: Payer: Self-pay | Admitting: *Deleted

## 2015-06-20 DIAGNOSIS — I82402 Acute embolism and thrombosis of unspecified deep veins of left lower extremity: Secondary | ICD-10-CM

## 2015-06-20 NOTE — Telephone Encounter (Signed)
Pt states Dr. Al CorpusHyatt did surgery 06/04/2015 and usually both of his legs swell, but his left is swelling above the compression sock, the toe is great after surgery.  Pt states the leg goes down when he is flat in bed, but not when the foot is elevated, and is only tender when touched.  I told pt I would inform Dr. Al CorpusHyatt and call if further instructions.

## 2015-06-20 NOTE — Telephone Encounter (Signed)
-----   Message from Kristian CoveyAshley E Prevette, [facility] sent at 06/20/2015  3:15 PM EST ----- Regarding: Schedule Venous study Per Dr Al CorpusHyatt - please schedule venous study for patient to R/O DVT. Thanks!

## 2015-06-21 ENCOUNTER — Ambulatory Visit (HOSPITAL_COMMUNITY)
Admission: RE | Admit: 2015-06-21 | Discharge: 2015-06-21 | Disposition: A | Discharge status: Home / Self Care | Payer: 59 | Source: Ambulatory Visit | Attending: Cardiology | Admitting: Cardiology

## 2015-06-21 DIAGNOSIS — I82402 Acute embolism and thrombosis of unspecified deep veins of left lower extremity: Secondary | ICD-10-CM | POA: Insufficient documentation

## 2015-06-21 DIAGNOSIS — M7989 Other specified soft tissue disorders: Secondary | ICD-10-CM | POA: Diagnosis not present

## 2015-06-30 ENCOUNTER — Encounter: Payer: 59 | Admitting: Podiatry

## 2015-06-30 ENCOUNTER — Ambulatory Visit (INDEPENDENT_AMBULATORY_CARE_PROVIDER_SITE_OTHER): Payer: 59

## 2015-06-30 ENCOUNTER — Ambulatory Visit (INDEPENDENT_AMBULATORY_CARE_PROVIDER_SITE_OTHER): Payer: 59 | Admitting: Podiatry

## 2015-06-30 ENCOUNTER — Encounter: Payer: Self-pay | Admitting: Podiatry

## 2015-06-30 VITALS — BP 104/62 | HR 89 | Resp 12

## 2015-06-30 DIAGNOSIS — Z9889 Other specified postprocedural states: Secondary | ICD-10-CM

## 2015-06-30 DIAGNOSIS — M205X2 Other deformities of toe(s) (acquired), left foot: Secondary | ICD-10-CM

## 2015-06-30 NOTE — Progress Notes (Signed)
He presents today for follow-up of his Lorenz CoasterKeller arthroplasty with a single silicone implant left foot. He states that is still little sore but much better than it was prior to surgery. He states this seems to be getting better all the time. Date of surgery 06/03/2015.  Objective: Vital signs are stable he is alert and oriented 3. The foot appears to be decreasing in swelling there is no erythema cellulitis drainage or odor. His great range of motion of the first metatarsophalangeal joint passively less actively. Radiographs confirm Keller arthroplasty in good position with grommets.  Assessment: Well-healing surgical foot left.  Plan: I encouraged range of motion exercises and for him to get back into his regular shoe gear he does present today with his Darco shoe. I also recommended due to the swelling in his legs that he discontinue the use of the anklet.

## 2015-07-07 ENCOUNTER — Telehealth: Payer: Self-pay | Admitting: *Deleted

## 2015-07-07 NOTE — Telephone Encounter (Signed)
Dr. Al CorpusHyatt reviewed pt's fungal culture 06/16/2015 as +fungus, and states will discuss with pt at next appt.  Informed pt of results and orders.

## 2015-07-28 ENCOUNTER — Encounter: Payer: Self-pay | Admitting: Podiatry

## 2015-07-28 ENCOUNTER — Ambulatory Visit (INDEPENDENT_AMBULATORY_CARE_PROVIDER_SITE_OTHER): Payer: 59 | Admitting: Podiatry

## 2015-07-28 ENCOUNTER — Ambulatory Visit (INDEPENDENT_AMBULATORY_CARE_PROVIDER_SITE_OTHER): Payer: 59

## 2015-07-28 VITALS — BP 117/77 | HR 90 | Resp 12

## 2015-07-28 DIAGNOSIS — M205X2 Other deformities of toe(s) (acquired), left foot: Secondary | ICD-10-CM

## 2015-07-28 DIAGNOSIS — Z9889 Other specified postprocedural states: Secondary | ICD-10-CM | POA: Diagnosis not present

## 2015-07-28 DIAGNOSIS — L603 Nail dystrophy: Secondary | ICD-10-CM

## 2015-07-28 LAB — CBC WITH DIFFERENTIAL/PLATELET
Basophils Absolute: 0 10*3/uL (ref 0.0–0.1)
Basophils Relative: 0 % (ref 0–1)
Eosinophils Absolute: 0.1 10*3/uL (ref 0.0–0.7)
Eosinophils Relative: 1 % (ref 0–5)
HCT: 35.9 % — ABNORMAL LOW (ref 39.0–52.0)
Hemoglobin: 10.5 g/dL — ABNORMAL LOW (ref 13.0–17.0)
Lymphocytes Relative: 28 % (ref 12–46)
Lymphs Abs: 3.3 10*3/uL (ref 0.7–4.0)
MCH: 19.2 pg — ABNORMAL LOW (ref 26.0–34.0)
MCHC: 29.2 g/dL — ABNORMAL LOW (ref 30.0–36.0)
MCV: 65.5 fL — ABNORMAL LOW (ref 78.0–100.0)
MPV: 8.8 fL (ref 8.6–12.4)
Monocytes Absolute: 1.1 10*3/uL — ABNORMAL HIGH (ref 0.1–1.0)
Monocytes Relative: 9 % (ref 3–12)
Neutro Abs: 7.3 10*3/uL (ref 1.7–7.7)
Neutrophils Relative %: 62 % (ref 43–77)
Platelets: 450 10*3/uL — ABNORMAL HIGH (ref 150–400)
RBC: 5.48 MIL/uL (ref 4.22–5.81)
RDW: 20.1 % — ABNORMAL HIGH (ref 11.5–15.5)
WBC: 11.7 10*3/uL — ABNORMAL HIGH (ref 4.0–10.5)

## 2015-07-28 LAB — HEPATIC FUNCTION PANEL
ALT: 18 U/L (ref 9–46)
AST: 23 U/L (ref 10–35)
Albumin: 4.3 g/dL (ref 3.6–5.1)
Alkaline Phosphatase: 79 U/L (ref 40–115)
Bilirubin, Direct: 0.1 mg/dL (ref <=0.2)
Indirect Bilirubin: 0.2 mg/dL (ref 0.2–1.2)
Total Bilirubin: 0.3 mg/dL (ref 0.2–1.2)
Total Protein: 7.1 g/dL (ref 6.1–8.1)

## 2015-07-28 MED ORDER — TERBINAFINE HCL 250 MG PO TABS: 250.0000 mg | ORAL_TABLET | Freq: Every day | ORAL | Status: DC

## 2015-07-28 NOTE — Patient Instructions (Signed)

## 2015-07-28 NOTE — Progress Notes (Signed)
He presents today for follow-up of his Lorenz Coaster bunion repair first metatarsophalangeal joint left foot. Date of surgery is 06/03/2015. He states this seems to be doing better and better all the time.  Objective: Vital signs are stable he is alert and oriented 3. Pulses are strongly palpable. He has great range of motion of the first metatarsophalangeal joint of the left foot.  Assessment: Well-healing surgical foot left.  Plan: Follow up with me in 1 month. I would allow him to go back to work.

## 2015-08-01 ENCOUNTER — Telehealth: Payer: Self-pay | Admitting: *Deleted

## 2015-08-01 NOTE — Telephone Encounter (Addendum)
-----   Message from Elinor Parkinson, [place] sent at 08/01/2015  8:01 AM EST ----- May continue medication. Blood work looks good.  Left message with Dr. Geryl Rankins orders.

## 2015-08-02 ENCOUNTER — Encounter: Payer: Self-pay | Admitting: Family Medicine

## 2015-08-02 ENCOUNTER — Ambulatory Visit (INDEPENDENT_AMBULATORY_CARE_PROVIDER_SITE_OTHER): Payer: 59 | Admitting: Family Medicine

## 2015-08-02 VITALS — BP 120/72 | HR 74 | Wt 295.0 lb

## 2015-08-02 DIAGNOSIS — K117 Disturbances of salivary secretion: Secondary | ICD-10-CM

## 2015-08-02 DIAGNOSIS — N3942 Incontinence without sensory awareness: Secondary | ICD-10-CM | POA: Diagnosis not present

## 2015-08-02 DIAGNOSIS — F607 Dependent personality disorder: Secondary | ICD-10-CM

## 2015-08-02 DIAGNOSIS — B370 Candidal stomatitis: Secondary | ICD-10-CM | POA: Diagnosis not present

## 2015-08-02 DIAGNOSIS — R682 Dry mouth, unspecified: Principal | ICD-10-CM

## 2015-08-02 MED ORDER — FLUCONAZOLE 100 MG PO TABS: 100.0000 mg | ORAL_TABLET | Freq: Every day | ORAL | Status: DC

## 2015-08-02 NOTE — Patient Instructions (Signed)
Follow up with Dr. Leslie Dales concerning continued use of the Diflucan. Check with your insurance company to find out the psychiatrist and let me know

## 2015-08-02 NOTE — Progress Notes (Signed)
   Subjective:    Patient ID: [name], male    DOB: 1955-08-26, 60 y.o.   MRN: [id number]  HPI He is here for multiple issues. He has had difficulty with incontinence and has been seeing Dr.Macdiarmid who has tried him on several different medications and done urodynamics. So far no other medications have worked or he has had unacceptable side effects. He would like to be referred to a different urologist. He also is had difficulty with oral thrush and states that on at least 3 occasions he has had to treat this over the last 6 months with recurrence of the medication. He has been given 2 different antifungal medications. Apparently the diagnosis was made by biopsy. Also his psychiatrist is no longer covered under his present insurance plan. Review of Systems     Objective:   Physical Exam Alert and in no distress. Exam of the mouth does show erythema but no whitish patches. Neck is supple without adenopathy. The mouth is dry.       Assessment & Plan:  Xerostomia  Oral thrush - Plan: fluconazole (DIFLUCAN) 100 MG tablet  Incontinence without sensory awareness  Passive-dependent personality disorder I will give him Diflucan to use for the next week with a refill. Encouraged him to let his endocrinologist know about this. It looks as if he is going to need to be placed on a suppression type regimen since he has had a reoccurrence of this however I would defer that to Dr. Leslie Dales.Recommend he check with his insurance to see which psychiatrists are covered and set up an appointment. I will work with him concerning that therapist. I discussed his care with Dr. Sherron Monday and he recommended getting a second opinion from Dr. Marlou Porch. I relayed this information to [place].

## 2015-08-08 ENCOUNTER — Encounter: Payer: Self-pay | Admitting: Family Medicine

## 2015-08-19 ENCOUNTER — Encounter: Payer: Self-pay | Admitting: Family Medicine

## 2015-08-25 ENCOUNTER — Ambulatory Visit (INDEPENDENT_AMBULATORY_CARE_PROVIDER_SITE_OTHER): Payer: 59

## 2015-08-25 ENCOUNTER — Encounter: Payer: Self-pay | Admitting: Podiatry

## 2015-08-25 ENCOUNTER — Ambulatory Visit (INDEPENDENT_AMBULATORY_CARE_PROVIDER_SITE_OTHER): Payer: 59 | Admitting: Podiatry

## 2015-08-25 VITALS — BP 122/60 | HR 70 | Resp 12

## 2015-08-25 DIAGNOSIS — Z9289 Personal history of other medical treatment: Secondary | ICD-10-CM

## 2015-08-25 DIAGNOSIS — Z9889 Other specified postprocedural states: Secondary | ICD-10-CM

## 2015-08-25 DIAGNOSIS — Z79899 Other long term (current) drug therapy: Secondary | ICD-10-CM

## 2015-08-25 DIAGNOSIS — M205X2 Other deformities of toe(s) (acquired), left foot: Secondary | ICD-10-CM

## 2015-08-25 MED ORDER — TERBINAFINE HCL 250 MG PO TABS: 250.0000 mg | ORAL_TABLET | Freq: Every day | ORAL | Status: DC

## 2015-08-25 NOTE — Progress Notes (Signed)
He presents today for his final follow-up visit regarding his left great toe. He states it is doing so much better and is doing very well at work. He is very excited about this. He has completed his first dose of Lamisil and denies fever chills nausea vomiting muscle aches or pains itching or rashes associated with the medication.  Objective: Vital signs are stable he is alert and oriented 3. Pulses are palpable. He has great range of motion of the first metatarsophalangeal joint of the left foot without complications radiographs confirm a well-placed Keller arthroplasty with a single silicone implant and grommets. Much decrease in edema on radiographs from previous radiographs. Nail plate hallux left appears to be healing nicely with the use of Lamisil.  Assessment: Onychomycosis hallux. Well-healing first metatarsophalangeal joint status post Lorenz Coaster.  Plan: We no longer need to evaluate him for his [facility] arthroplasty. I did write him a prescription for Lamisil 250 mg #90 tablets one by mouth daily with no refills. I will follow-up with him should his blood work him back abnormal otherwise I will see him in 4 months.

## 2015-08-26 ENCOUNTER — Telehealth: Payer: Self-pay | Admitting: *Deleted

## 2015-08-26 MED ORDER — TERBINAFINE HCL 250 MG PO TABS: 250.0000 mg | ORAL_TABLET | Freq: Every day | ORAL | Status: DC

## 2015-08-26 NOTE — Telephone Encounter (Signed)
Pt states he wants his 90 day supply of medication called to [facility].  I was unable to contact pt, but left a message that I would transfer his 90 day rx of Lamisil to the [facility].

## 2015-08-31 LAB — CBC WITH DIFFERENTIAL/PLATELET
Basophils Absolute: 0 10*3/uL (ref 0.0–0.1)
Basophils Relative: 0 % (ref 0–1)
Eosinophils Absolute: 0.1 10*3/uL (ref 0.0–0.7)
Eosinophils Relative: 1 % (ref 0–5)
HCT: 33.8 % — ABNORMAL LOW (ref 39.0–52.0)
Hemoglobin: 10.1 g/dL — ABNORMAL LOW (ref 13.0–17.0)
Lymphocytes Relative: 24 % (ref 12–46)
Lymphs Abs: 2.2 10*3/uL (ref 0.7–4.0)
MCH: 19.5 pg — ABNORMAL LOW (ref 26.0–34.0)
MCHC: 29.9 g/dL — ABNORMAL LOW (ref 30.0–36.0)
MCV: 65.4 fL — ABNORMAL LOW (ref 78.0–100.0)
MPV: 8.8 fL (ref 8.6–12.4)
Monocytes Absolute: 0.7 10*3/uL (ref 0.1–1.0)
Monocytes Relative: 8 % (ref 3–12)
Neutro Abs: 6.2 10*3/uL (ref 1.7–7.7)
Neutrophils Relative %: 67 % (ref 43–77)
Platelets: 385 10*3/uL (ref 150–400)
RBC: 5.17 MIL/uL (ref 4.22–5.81)
RDW: 19.5 % — ABNORMAL HIGH (ref 11.5–15.5)
WBC: 9.2 10*3/uL (ref 4.0–10.5)

## 2015-09-01 LAB — HEPATIC FUNCTION PANEL
ALT: 17 U/L (ref 9–46)
AST: 24 U/L (ref 10–35)
Albumin: 3.9 g/dL (ref 3.6–5.1)
Alkaline Phosphatase: 76 U/L (ref 40–115)
Bilirubin, Direct: 0.1 mg/dL (ref <=0.2)
Indirect Bilirubin: 0.2 mg/dL (ref 0.2–1.2)
Total Bilirubin: 0.3 mg/dL (ref 0.2–1.2)
Total Protein: 6.5 g/dL (ref 6.1–8.1)

## 2015-09-01 NOTE — Telephone Encounter (Addendum)
-----   Message from Elinor Parkinson, [place] sent at 09/01/2015  7:31 AM EST ----- Blood work looks ok and may continue medication.  09/01/2015-LEFT MESSAGE INFORMING pt of orders.

## 2015-10-10 ENCOUNTER — Encounter: Payer: Self-pay | Admitting: Family Medicine

## 2015-10-10 ENCOUNTER — Ambulatory Visit (INDEPENDENT_AMBULATORY_CARE_PROVIDER_SITE_OTHER): Payer: 59 | Admitting: Family Medicine

## 2015-10-10 VITALS — BP 132/72 | HR 60 | Temp 97.6°F | Wt 293.6 lb

## 2015-10-10 DIAGNOSIS — R197 Diarrhea, unspecified: Secondary | ICD-10-CM

## 2015-10-10 DIAGNOSIS — M25511 Pain in right shoulder: Secondary | ICD-10-CM

## 2015-10-10 DIAGNOSIS — Z8619 Personal history of other infectious and parasitic diseases: Secondary | ICD-10-CM | POA: Diagnosis not present

## 2015-10-10 LAB — CBC WITH DIFFERENTIAL/PLATELET
Basophils Absolute: 0 cells/uL (ref 0–200)
Basophils Relative: 0 %
Eosinophils Absolute: 88 cells/uL (ref 15–500)
Eosinophils Relative: 1 %
HCT: 35.4 % — ABNORMAL LOW (ref 38.5–50.0)
Hemoglobin: 10.6 g/dL — ABNORMAL LOW (ref 13.2–17.1)
Lymphocytes Relative: 27 %
Lymphs Abs: 2376 cells/uL (ref 850–3900)
MCH: 19.4 pg — ABNORMAL LOW (ref 27.0–33.0)
MCHC: 29.9 g/dL — ABNORMAL LOW (ref 32.0–36.0)
MCV: 64.7 fL — ABNORMAL LOW (ref 80.0–100.0)
MPV: 9.2 fL (ref 7.5–12.5)
Monocytes Absolute: 968 cells/uL — ABNORMAL HIGH (ref 200–950)
Monocytes Relative: 11 %
Neutro Abs: 5368 cells/uL (ref 1500–7800)
Neutrophils Relative %: 61 %
Platelets: 378 10*3/uL (ref 140–400)
RBC: 5.47 MIL/uL (ref 4.20–5.80)
RDW: 20.7 % — ABNORMAL HIGH (ref 11.0–15.0)
WBC: 8.8 10*3/uL (ref 4.0–10.5)

## 2015-10-10 LAB — HIV ANTIBODY (ROUTINE TESTING W REFLEX): HIV 1&2 Ab, 4th Generation: NONREACTIVE

## 2015-10-10 MED ORDER — CIPROFLOXACIN HCL 500 MG PO TABS: 500.0000 mg | ORAL_TABLET | Freq: Two times a day (BID) | ORAL | Status: DC

## 2015-10-10 NOTE — Patient Instructions (Signed)
The key is staying well hydrated. We will look for the underlying reason for the diarrhea you are having. Follow recommendations for BRAT diet as discussed.  Return stool sample as discussed.  Use heat and Ibuprofen for right shoulder and if not improving in next 2-3 days let us know.   You do not currently have thrush but we will check labs as requested to look for underlying reason for this.   Food Choices to Help Relieve Diarrhea, Adult When you have diarrhea, the foods you eat and your eating habits are very important. Choosing the right foods and drinks can help relieve diarrhea. Also, because diarrhea can last up to 7 days, you need to replace lost fluids and electrolytes (such as sodium, potassium, and chloride) in order to help prevent dehydration.  WHAT GENERAL GUIDELINES DO I NEED TO FOLLOW?  Slowly drink 1 cup (8 oz) of fluid for each episode of diarrhea. If you are getting enough fluid, your urine will be clear or pale yellow.  Eat starchy foods. Some good choices include white rice, white toast, pasta, low-fiber cereal, baked potatoes (without the skin), saltine crackers, and bagels.  Avoid large servings of any cooked vegetables.  Limit fruit to two servings per day. A serving is  cup or 1 small piece.  Choose foods with less than 2 g of fiber per serving.  Limit fats to less than 8 tsp (38 g) per day.  Avoid fried foods.  Eat foods that have probiotics in them. Probiotics can be found in certain dairy products.  Avoid foods and beverages that may increase the speed at which food moves through the stomach and intestines (gastrointestinal tract). Things to avoid include:  High-fiber foods, such as dried fruit, raw fruits and vegetables, nuts, seeds, and whole grain foods.  Spicy foods and high-fat foods.  Foods and beverages sweetened with high-fructose corn syrup, honey, or sugar alcohols such as xylitol, sorbitol, and mannitol. WHAT FOODS ARE  RECOMMENDED? Grains White rice. White, [place]French, or pita breads (fresh or toasted), including plain rolls, buns, or bagels. White pasta. Saltine, soda, or graham crackers. Pretzels. Low-fiber cereal. Cooked cereals made with water (such as cornmeal, farina, or cream cereals). Plain muffins. Matzo. Melba toast. Zwieback.  Vegetables Potatoes (without the skin). Strained tomato and vegetable juices. Most well-cooked and canned vegetables without seeds. Tender lettuce. Fruits Cooked or canned applesauce, apricots, cherries, fruit cocktail, grapefruit, peaches, pears, or plums. Fresh bananas, apples without skin, cherries, grapes, cantaloupe, grapefruit, peaches, oranges, or plums.  Meat and Other Protein Products Baked or boiled chicken. Eggs. Tofu. Fish. Seafood. Smooth peanut butter. Ground or well-cooked tender beef, ham, veal, lamb, pork, or poultry.  Dairy Plain yogurt, kefir, and unsweetened liquid yogurt. Lactose-free milk, buttermilk, or soy milk. Plain hard cheese. Beverages Sport drinks. Clear broths. Diluted fruit juices (except prune). Regular, caffeine-free sodas such as ginger ale. Water. Decaffeinated teas. Oral rehydration solutions. Sugar-free beverages not sweetened with sugar alcohols. Other Bouillon, broth, or soups made from recommended foods.  The items listed above may not be a complete list of recommended foods or beverages. Contact your dietitian for more options. WHAT FOODS ARE NOT RECOMMENDED? Grains Whole grain, whole wheat, bran, or rye breads, rolls, pastas, crackers, and cereals. Wild or brown rice. Cereals that contain more than 2 g of fiber per serving. Corn tortillas or taco shells. Cooked or dry oatmeal. Granola. Popcorn. Vegetables Raw vegetables. Cabbage, broccoli, Brussels sprouts, artichokes, baked beans, beet greens, corn, kale, legumes, peas, sweet potatoes,  and yams. Potato skins. Cooked spinach and cabbage. Fruits Dried fruit, including raisins and dates.  Raw fruits. Stewed or dried prunes. Fresh apples with skin, apricots, mangoes, pears, raspberries, and strawberries.  Meat and Other Protein Products Chunky peanut butter. Nuts and seeds. Beans and lentils. Berniece Salines.  Dairy High-fat cheeses. Milk, chocolate milk, and beverages made with milk, such as milk shakes. Cream. Ice cream. Sweets and Desserts Sweet rolls, doughnuts, and sweet breads. Pancakes and waffles. Fats and Oils Butter. Cream sauces. Margarine. Salad oils. Plain salad dressings. Olives. Avocados.  Beverages Caffeinated beverages (such as coffee, tea, soda, or energy drinks). Alcoholic beverages. Fruit juices with pulp. Prune juice. Soft drinks sweetened with high-fructose corn syrup or sugar alcohols. Other Coconut. Hot sauce. Chili powder. Mayonnaise. Gravy. Cream-based or milk-based soups.  The items listed above may not be a complete list of foods and beverages to avoid. Contact your dietitian for more information. WHAT SHOULD I DO IF I BECOME DEHYDRATED? Diarrhea can sometimes lead to dehydration. Signs of dehydration include dark urine and dry mouth and skin. If you think you are dehydrated, you should rehydrate with an oral rehydration solution. These solutions can be purchased at pharmacies, retail stores, or online.  Drink -1 cup (120-240 mL) of oral rehydration solution each time you have an episode of diarrhea. If drinking this amount makes your diarrhea worse, try drinking smaller amounts more often. For example, drink 1-3 tsp (5-15 mL) every 5-10 minutes.  A general rule for staying hydrated is to drink 1-2 L of fluid per day. Talk to your health care provider about the specific amount you should be drinking each day. Drink enough fluids to keep your urine clear or pale yellow.   This information is not intended to replace advice given to you by your health care provider. Make sure you discuss any questions you have with your health care provider.   Document Released:  09/08/2003 Document Revised: 07/09/2014 Document Reviewed: 05/11/2013 Elsevier Interactive Patient Education [employer].

## 2015-10-10 NOTE — Progress Notes (Signed)
   Subjective:    Patient ID: [name], male    DOB: June 20, 1956, 60 y.o.   MRN: [id number]  HPI Chief Complaint  Patient presents with  . Advice Only    diarrhea since saturday, legs swelling, wants to be tested for HIV, as patient has thrust   He is here with multiple complaints. Complains of a 3 day history of diarrhea, a total of 10-12 times since onset and about 3 times today. Describes diarrhea as loose and watery, no blood or pus. States he ate some pork sausages the day prior to diarrhea onset and that they had been in his refrigerator for about a week prior to cooking them. He thinks that he cooked them thoroughly. Reports some mild abdominal bloating but no pain. Denies recent travel or antibiotic use. Taking generic Imodium without relief. States he has been drinking only water and eating rice.  Denies fever, chills, nausea, vomiting, back pain. Denies decreased urine output.   States his left leg has been swollen but today is approximately half the size it normally is. States this is much improved.   Complains of being treated for thrush on several occasions and each time the symptoms recur. States his endocrinologist recommends that he be tested for HIV. He denies having thrush today.   Complains of right shoulder hurting for past 2 days and Ibuprofen has not helped. Denies injury. Reports history of arthritis to shoulder.  States he has normal ROM but increased pain. Denies weakness, numbness, tingling, locking or popping.    States he had great toe surgery on left foot and has been released from [facility]. No complaints with this today.   Reviewed allergies, medications, past medical, and social history.    Review of Systems Pertinent positives and negatives in the history of present illness.     Objective:   Physical Exam BP 132/72 mmHg  Pulse 60  Temp(Src) 97.6 F (36.4 C) (Oral)  Wt 293 lb 9.6 oz (133.176 kg)  Alert and in no distress.  Pharyngeal area is  normal, mouth and tongue normal and without lesions. Neck is supple without adenopathy. Cardiac exam shows a regular sinus rhythm without murmurs or gallops. Lungs are clear to auscultation. Abdomen soft, non distended, non tender, normal bowel sounds in all quadrants, no guarding, rebound or referred pain.  LLE with 1+ trace edema, normal color, sensation and pulse, RLE without edema.       Assessment & Plan:  Diarrhea, unspecified type - Plan: CBC with Differential/Platelet, Comprehensive metabolic panel  History of thrush - Plan: HIV antibody  Shoulder pain, right  Discussed that we will treat him empirically with Cipro. Recommend BRAT diet and staying well hydrated.  Discussed that he does not appear infectious. Will check labs. Prescription for culture, O and P, C-diff and stool specimen cups given with instructions to hold off and see if antibiotic therapy helps with diarrhea. Will check HIV status, he does not have thrush today. Discussed that since his LLE is half the size as it has been, we will do nothing and hope it continues to improve.  Recommend taking Ibuprofen with food and using heat to right shoulder, if this is not improving in next few days he may need an XR or further evaluation. He has full ROM and I recommend he continue to move his shoulder as tolerated.

## 2015-10-11 LAB — COMPREHENSIVE METABOLIC PANEL
ALT: 18 U/L (ref 9–46)
AST: 23 U/L (ref 10–35)
Albumin: 4.4 g/dL (ref 3.6–5.1)
Alkaline Phosphatase: 86 U/L (ref 40–115)
BUN: 12 mg/dL (ref 7–25)
CO2: 25 mmol/L (ref 20–31)
Calcium: 9.8 mg/dL (ref 8.6–10.3)
Chloride: 104 mmol/L (ref 98–110)
Creat: 0.77 mg/dL (ref 0.70–1.33)
Glucose, Bld: 106 mg/dL — ABNORMAL HIGH (ref 65–99)
Potassium: 4.6 mmol/L (ref 3.5–5.3)
Sodium: 139 mmol/L (ref 135–146)
Total Bilirubin: 0.3 mg/dL (ref 0.2–1.2)
Total Protein: 7.2 g/dL (ref 6.1–8.1)

## 2015-10-12 ENCOUNTER — Encounter: Payer: Self-pay | Admitting: Family Medicine

## 2015-10-13 ENCOUNTER — Encounter: Payer: Self-pay | Admitting: Family Medicine

## 2015-10-21 ENCOUNTER — Ambulatory Visit (INDEPENDENT_AMBULATORY_CARE_PROVIDER_SITE_OTHER): Payer: 59 | Admitting: Family Medicine

## 2015-10-21 ENCOUNTER — Ambulatory Visit
Admission: RE | Admit: 2015-10-21 | Discharge: 2015-10-21 | Disposition: A | Discharge status: Home / Self Care | Payer: 59 | Source: Ambulatory Visit | Attending: Family Medicine | Admitting: Family Medicine

## 2015-10-21 VITALS — BP 126/78 | HR 70 | Wt 297.4 lb

## 2015-10-21 DIAGNOSIS — Z8619 Personal history of other infectious and parasitic diseases: Secondary | ICD-10-CM | POA: Diagnosis not present

## 2015-10-21 DIAGNOSIS — R600 Localized edema: Secondary | ICD-10-CM

## 2015-10-21 DIAGNOSIS — M25511 Pain in right shoulder: Secondary | ICD-10-CM

## 2015-10-21 DIAGNOSIS — E119 Type 2 diabetes mellitus without complications: Secondary | ICD-10-CM

## 2015-10-21 DIAGNOSIS — Z794 Long term (current) use of insulin: Secondary | ICD-10-CM

## 2015-10-21 MED ORDER — FLUCONAZOLE 100 MG PO TABS: 100.0000 mg | ORAL_TABLET | Freq: Every day | ORAL | Status: DC

## 2015-10-21 NOTE — Patient Instructions (Addendum)
Hold your simvastatin for the week that you are on the Diflucan You can take 4 ibuprofen 3 times per day for pain relief usecompression stocking regularly until it goes away and then after that as needed

## 2015-10-22 NOTE — Progress Notes (Signed)
   Subjective:    Patient ID: [name], male    DOB: [birth year], 60 y.o.   MRN: [id number]  HPI He is here for consult concerning multiple issues. Apparently he has been treated in the past by oral surgeons as well as his dentist for thrush.He did have biopsy did show positivity for Candida. He has tried nystatin as well as Mycostatin however he apparently has a recurrence of this. He has had recent HIV testing which was negative. He does have underlying diabetes but apparently this is under good control. His last hemoglobin A1c was 7.1 presently he is having no mouth trouble or swallowing issues. He also complains of right shoulder pain that has been going on for quite some time. He has used small doses of ibuprofen concerning this. He states he has had previous x-rays on this several years ago which showed evidence of arthritis. It apparently is interfering with his sleep. He also has swelling of the left leg for the last month however further review indicates he had difficulty with this several months ago and apparently did have an evaluation. He was told that he did not have any clots. Her chest pain or shortness of breath.was told to use port stockings which apparently did help.     Review of Systems     Objective:   Physical Exam  alert and in no distress. Exam of the mouth shows no lesions present. Neck supple without adenopathy.right shoulder exam shows full motion. Slight discomfort with Neer's and Hawkins test. No sulcus sign. Left leg exam does show 2+ pitting edema from the knee on down. Homan's is negative. It is not warm or hot or tender He was sent for an x-ray which showed no evidence of arthritis.       Assessment & Plan:  History of thrush - Plan: fluconazole (DIFLUCAN) 100 MG tablet  Right shoulder pain - Plan: DG Shoulder Right  Type 2 diabetes mellitus without complication, with long-term current use of insulin (HCC)  Leg edema, left I will try him on  Diflucan for 1 week to ensure that there is no oral or even esophageal issues. Hopefully this will help. He is also to hold his simvastatin while he is on the Diflucan. He is to return for reevaluation of the right shoulder and possible injection. Recommend using support hose to help with the leg edema.

## 2015-10-23 ENCOUNTER — Other Ambulatory Visit: Payer: Self-pay | Admitting: Podiatry

## 2015-10-26 ENCOUNTER — Encounter: Payer: Self-pay | Admitting: Family Medicine

## 2015-11-01 ENCOUNTER — Ambulatory Visit (INDEPENDENT_AMBULATORY_CARE_PROVIDER_SITE_OTHER): Payer: 59 | Admitting: Family Medicine

## 2015-11-01 ENCOUNTER — Encounter: Payer: Self-pay | Admitting: Family Medicine

## 2015-11-01 VITALS — BP 100/60 | HR 74 | Ht 72.0 in | Wt 287.0 lb

## 2015-11-01 DIAGNOSIS — M25511 Pain in right shoulder: Secondary | ICD-10-CM | POA: Diagnosis not present

## 2015-11-01 DIAGNOSIS — M7581 Other shoulder lesions, right shoulder: Secondary | ICD-10-CM

## 2015-11-01 MED ORDER — TRIAMCINOLONE ACETONIDE 40 MG/ML IJ SUSP
40.0000 mg | Freq: Once | INTRAMUSCULAR | Status: AC
  Administered 2015-11-01: 40 mg via INTRAMUSCULAR

## 2015-11-01 MED ORDER — LIDOCAINE HCL (PF) 1 % IJ SOLN
2.0000 mL | Freq: Once | INTRAMUSCULAR | Status: AC
  Administered 2015-11-01: 2 mL via INTRADERMAL

## 2015-11-01 NOTE — Progress Notes (Signed)
Subjective:     Patient ID: [name], male   DOB: 23-Oct-1955, 60 y.o.   MRN: [id number]  HPI [name] presents today with 4-5 week history of constant right shoulder pain that he says sometimes radiates down his lateral arm.  He says this pain worsens with movement and does not recall any preceding trauma. He has had a >10 year history of shoulder pain that feels similar but has not been constant over this time period.  He has a history of arthritis in other joints including a joint replacement in his right foot, but recent XR did not show any signs of osteoarthritis in the glenohumeral joint. He has tried taking 800 mg of Ibuprofen every 8 hrs, but says this has not helped with the pain.      Review of Systems     Objective:   Physical Exam   Alert and in no apparent distress. Right shoulder and subacromial space non-tender to palpation. Pain with on extension and abduction of the arm.  Normal ROM and no pain with flexion and internal / external rotation. Positive Hawkings test.  Negative Neers.Negative sulcus sign. Drop arm test was uncomfortable. CV - RRR without murmurs, rubs, or gallops. Lungs - Clear to auscultation bilaterally.     Assessment/Plan:    Right shoulder pain - Plan: triamcinolone acetonide (KENALOG-40) injection 40 mg, lidocaine (PF) (XYLOCAINE) 1 % injection 2 mL  Rotator cuff tendinitis, right The right shoulder was prepped posterolaterally. 3 mL of Xylocaine and 40 mg of Kenalog was injected into the subacromial space without difficulty. He obtained relatively quick relief of his symptoms. He will call if he has further troubles. He tolerated the procedure well.

## 2015-11-18 ENCOUNTER — Encounter: Payer: Self-pay | Admitting: Family Medicine

## 2015-11-18 ENCOUNTER — Ambulatory Visit (INDEPENDENT_AMBULATORY_CARE_PROVIDER_SITE_OTHER): Payer: 59 | Admitting: Family Medicine

## 2015-11-18 VITALS — BP 110/60 | HR 84 | Wt 290.0 lb

## 2015-11-18 DIAGNOSIS — M25511 Pain in right shoulder: Secondary | ICD-10-CM

## 2015-11-18 NOTE — Progress Notes (Signed)
   Subjective:    Patient ID: [name], male    DOB: 01/22/1956, 60 y.o.   MRN: [id number]  HPI  he is here for a recheck. He had a injection into the subacromial bursa approximately 2 weeks ago. He states that he was doing fairly well for approximately 1 week and then the symptom of shoulder pain is recurred including waking him up at night. He describes this as a 6 out of 10 pain.   Review of Systems     Objective:   Physical Exam  alert and in no distress otherwise not examined       Assessment & Plan:  Right shoulder pain - Plan: Ambulatory referral to Orthopedic Surgery  he has had an x-ray as well as an injection with minimal relief of the symptoms and I explained that this indicates that there is probably more going on than basic tendinitis an orthopedic referral is needed.

## 2015-11-21 ENCOUNTER — Other Ambulatory Visit: Payer: Self-pay | Admitting: Family Medicine

## 2015-11-24 ENCOUNTER — Ambulatory Visit (INDEPENDENT_AMBULATORY_CARE_PROVIDER_SITE_OTHER): Payer: 59 | Admitting: Podiatry

## 2015-11-24 ENCOUNTER — Encounter: Payer: Self-pay | Admitting: Podiatry

## 2015-11-24 VITALS — BP 117/88 | HR 69 | Resp 12

## 2015-11-24 DIAGNOSIS — Z9289 Personal history of other medical treatment: Secondary | ICD-10-CM

## 2015-11-24 DIAGNOSIS — Z79899 Other long term (current) drug therapy: Secondary | ICD-10-CM

## 2015-11-24 MED ORDER — TERBINAFINE HCL 250 MG PO TABS: 250.0000 mg | ORAL_TABLET | Freq: Every day | ORAL | Status: DC

## 2015-11-24 NOTE — Progress Notes (Signed)
He presents today after completing 120 days of Lamisil therapy. He states that his nails are growing out very nicely. He had no problems taking the medicine.  Objective: Vital signs are stable he is alert and oriented 3. No erythema edema saline as drainage or odor nails are approximately 75-80% resolved.  Assessment: Long-term therapy for onychomycosis.  Plan: We started him on another 60 days of Lamisil 1 tablet every other day and I will follow-up with him in 3 months.

## 2016-01-17 ENCOUNTER — Encounter: Payer: Self-pay | Admitting: Family Medicine

## 2016-01-17 MED ORDER — DESVENLAFAXINE ER 100 MG PO TB24: 1.0000 | ORAL_TABLET | Freq: Every day | ORAL | Status: DC

## 2016-01-17 MED ORDER — BUPROPION HCL ER (SR) 200 MG PO TB12: 200.0000 mg | ORAL_TABLET | Freq: Two times a day (BID) | ORAL | Status: DC

## 2016-01-30 ENCOUNTER — Ambulatory Visit (INDEPENDENT_AMBULATORY_CARE_PROVIDER_SITE_OTHER): Payer: 59 | Admitting: Family Medicine

## 2016-01-30 ENCOUNTER — Telehealth: Payer: Self-pay | Admitting: Family Medicine

## 2016-01-30 ENCOUNTER — Encounter: Payer: Self-pay | Admitting: Family Medicine

## 2016-01-30 VITALS — BP 122/66 | HR 72 | Wt 291.0 lb

## 2016-01-30 DIAGNOSIS — Z8619 Personal history of other infectious and parasitic diseases: Secondary | ICD-10-CM | POA: Diagnosis not present

## 2016-01-30 DIAGNOSIS — B370 Candidal stomatitis: Secondary | ICD-10-CM

## 2016-01-30 MED ORDER — FLUCONAZOLE 100 MG PO TABS: 100.0000 mg | ORAL_TABLET | Freq: Every day | ORAL | 0 refills | Status: DC

## 2016-01-30 NOTE — Progress Notes (Signed)
   Subjective:    Patient ID: [name], male    DOB: 1956-04-16, 60 y.o.   MRN: [id number]  HPI He is here for repeat evaluation. He states that the Diflucan did work however he again noted whitish lesions in his mouth that are slightly uncomfortable. His last A1c was 5.9. He also was checked for HIV several months ago and was negative.  Review of Systems     Objective:   Physical Exam Exam of the oral mucosa does show whitish lesions present on both lateral aspects of the tongue as well as in the mucosal area. KOH was positive.       Assessment & Plan:  Candidiasis of mouth - Plan: fluconazole (DIFLUCAN) 100 MG tablet  History of thrush - Plan: fluconazole (DIFLUCAN) 100 MG tablet I will treat him again however she continues to have outbreaks, further evaluation will be needed.

## 2016-01-30 NOTE — Telephone Encounter (Signed)
Pt called stating that he forgot to mention at his appt today that he is not able to sleep well at night so he thinks his CPAP machine may need to be adjusted and per pt this all may be affecting his immune system that Dr Susann Givens is concerned about

## 2016-01-30 NOTE — Telephone Encounter (Signed)
Pt informed to take card to have a readout done he verbalized understanding

## 2016-01-30 NOTE — Telephone Encounter (Signed)
Let him know that we will need a readout on his machine and explain that to him.

## 2016-02-15 ENCOUNTER — Telehealth: Payer: Self-pay | Admitting: Family Medicine

## 2016-02-15 NOTE — Telephone Encounter (Signed)
Reinforced the fact that if he wakes up he needs to make sure he puts the CPAP back on and not let pain or going to the bathroom interfere. Then we can determine whether to doing any good or not

## 2016-02-15 NOTE — Telephone Encounter (Signed)
[name] states he is not using it because he is always waking up he doesn't think cpap is getting him enough oxygen then he said it maybe from having to use restroom or pain from his shoulder

## 2016-02-15 NOTE — Telephone Encounter (Signed)
Left word for word message  

## 2016-02-15 NOTE — Telephone Encounter (Signed)
From the report it looks like he is only using it 60% of the time. He needs to make that 100%. Then redo the read out

## 2016-02-15 NOTE — Telephone Encounter (Signed)
Pt called and wants to know after you read his CPAP report, does he need an adjustment to his machine.  He feels he does as he is falling asleep during the day and just as soon as he gets in his car, he falls asleep.  I have placed report on your desk.  Please advise pt at [phone]

## 2016-02-21 ENCOUNTER — Other Ambulatory Visit: Payer: Self-pay

## 2016-02-21 ENCOUNTER — Telehealth: Payer: Self-pay | Admitting: Internal Medicine

## 2016-02-21 DIAGNOSIS — B370 Candidal stomatitis: Secondary | ICD-10-CM

## 2016-02-21 NOTE — Telephone Encounter (Signed)
Let's give him an appointment with infectious disease

## 2016-02-21 NOTE — Telephone Encounter (Signed)
Pt informed and verbalized understanding and I have put the order in the system

## 2016-02-21 NOTE — Telephone Encounter (Signed)
Pt states that his fungal infection has come back this week. He finished his medication last week. Please advise pt what the next step is. If pharmacy is needed send to [facility] [address]

## 2016-02-23 ENCOUNTER — Ambulatory Visit: Payer: 59 | Admitting: Podiatry

## 2016-02-24 ENCOUNTER — Telehealth: Payer: Self-pay

## 2016-02-24 DIAGNOSIS — Z8619 Personal history of other infectious and parasitic diseases: Secondary | ICD-10-CM

## 2016-02-24 DIAGNOSIS — B370 Candidal stomatitis: Secondary | ICD-10-CM

## 2016-02-24 MED ORDER — FLUCONAZOLE 100 MG PO TABS: 100.0000 mg | ORAL_TABLET | Freq: Every day | ORAL | 0 refills | Status: DC

## 2016-02-24 NOTE — Telephone Encounter (Signed)
Pt states that he does not have an appt until end of Sept for yeast infection  With ID. He request a mouth wash for this in the meantime. Send to  [facility] [address]

## 2016-02-24 NOTE — Telephone Encounter (Signed)
Pt is having white spots. Pt previous doctor had prescribed a mouth wash before but not here. I have resent in diflucan for pt and if not better after meds then follow-up with Dr. Susann GivensLalonde until appt with Infectious

## 2016-02-24 NOTE — Telephone Encounter (Signed)
Please call and find out if he is having any symptoms such as white spots in his mouth or throat at this time. He may need to be seen for this or follow up with Dr. Susann GivensLalonde week. He has been taking oral medication, I do not see a mouthwash in his chart. Did he get a mouthwash?

## 2016-02-24 NOTE — Telephone Encounter (Signed)
Pt states that he has a sore tongue, and is having some bleeding around his lips from red blisters.

## 2016-02-25 NOTE — Telephone Encounter (Signed)
Since his symptoms have changed. Let me see him

## 2016-02-27 ENCOUNTER — Ambulatory Visit (INDEPENDENT_AMBULATORY_CARE_PROVIDER_SITE_OTHER): Payer: 59 | Admitting: Family Medicine

## 2016-02-27 ENCOUNTER — Encounter: Payer: Self-pay | Admitting: Family Medicine

## 2016-02-27 VITALS — BP 110/60 | Wt 287.0 lb

## 2016-02-27 DIAGNOSIS — K1321 Leukoplakia of oral mucosa, including tongue: Secondary | ICD-10-CM

## 2016-02-27 NOTE — Progress Notes (Signed)
   Subjective:    Patient ID: [name], male    DOB: [birth year], 60 y.o.   MRN: [id number]  HPI He originally made the appointment because of difficulty with some blistering and blood when the blisters opened. This subsequently has healed he has no more difficulty with this. He did note a whitish lesion present on his tongue. He states that he was also like to have his Diflucan renewed. Presently is having no symptoms of thrush.   Review of Systems     Objective:   Physical Exam Alert and in no distress. Exam of the mouth shows no whitish lesions other than a one and half centimeter round whitish lesion on the lateral aspect of the tongue on the left.       Assessment & Plan:  Leukoplakia of tongue I explained that this is most likely leukoplakia and might need to be biopsied. I will wait till after infectious disease sees him. He wanted a refill on Diflucan but I explained that unfortunately we would need to have him seen by ID when he is truly having a problem otherwise it was essentially be a normal exam. I will wait for ID to see him and possibly refer to oral surgery for a biopsy. Did recommend he take a picture of the tongue lesion

## 2016-02-27 NOTE — Telephone Encounter (Signed)
Left message for pt to make appointment

## 2016-03-06 ENCOUNTER — Ambulatory Visit (INDEPENDENT_AMBULATORY_CARE_PROVIDER_SITE_OTHER): Payer: 59 | Admitting: Podiatry

## 2016-03-06 ENCOUNTER — Encounter: Payer: Self-pay | Admitting: Podiatry

## 2016-03-06 DIAGNOSIS — L600 Ingrowing nail: Secondary | ICD-10-CM

## 2016-03-06 MED ORDER — TERBINAFINE HCL 250 MG PO TABS: 250.0000 mg | ORAL_TABLET | Freq: Every day | ORAL | 0 refills | Status: DC

## 2016-03-06 MED ORDER — NEOMYCIN-POLYMYXIN-HC 1 % OT SOLN: OTIC | 1 refills | Status: DC

## 2016-03-06 NOTE — Patient Instructions (Signed)

## 2016-03-07 NOTE — Progress Notes (Signed)
He presents today with chief complaint of ingrown nail along the fibular border of the left great toe times past several weeks. He states that, more painful as time goes on to like to see about having it removed. He is also into continues to take Lamisil which she has just finished up. He states his toenails are looking much better and is very happy with outcome thus far.  Objective: Vital signs are stable he is alert and oriented 3. No erythema Edema [address] with exception along the fibular border hallux left. His nail plates were now by proximal 75% and looked very clear and healthy.  Assessment: Ingrown nail paronychia abscess hallux fibular border left. Well-healing onychomycosis secondary Lamisil therapy.  Plan: Started him on another 60 days of Lamisil therapy one tablet every other day. We also performed a chemical matrixectomy to the fibular border of the hallux left today after local anesthesia was administered. He tolerated this procedure well without complications and will start soaking twice daily and Betadine warm water. He was provided with both oral and home going instructions for the care and soaking of his toe as well as prescription for Cortisporin Otic to be applied twice daily after soaking. I will follow-up with him in 1 week or sooner if necessary.

## 2016-03-09 NOTE — Progress Notes (Signed)
DOS 12.02.2016 Keller Arthroplasty with single silicone implant left 1st MTPJ

## 2016-03-20 ENCOUNTER — Ambulatory Visit (INDEPENDENT_AMBULATORY_CARE_PROVIDER_SITE_OTHER): Payer: 59 | Admitting: Podiatry

## 2016-03-20 ENCOUNTER — Encounter: Payer: Self-pay | Admitting: Podiatry

## 2016-03-20 DIAGNOSIS — L600 Ingrowing nail: Secondary | ICD-10-CM

## 2016-03-20 NOTE — Patient Instructions (Signed)

## 2016-03-20 NOTE — Progress Notes (Signed)
He presents today for follow-up of his matrixectomy paronychia fibular border hallux left. He states that it feels almost 100% better. He also states that his toenails are looking much better with the Lamisil therapy.  Objective: Vital signs are stable he is alert and oriented 3. No erythema edema saline as drainage or odor to the fibular border of the hallux left. This has gone on to heal uneventfully. His toenails are becoming less thick less discolored and appeared to be healing very nicely with the use of the oral antifungal.  Assessment: Well-healing matrixectomy fibular border hallux left well-healing onychomycosis with the use of long-term therapy.  Plan: Follow-up with him in a couple of months for a reevaluation of his oral medication.

## 2016-03-22 ENCOUNTER — Encounter: Payer: Self-pay | Admitting: Infectious Disease

## 2016-03-22 ENCOUNTER — Ambulatory Visit (INDEPENDENT_AMBULATORY_CARE_PROVIDER_SITE_OTHER): Payer: 59 | Admitting: Infectious Disease

## 2016-03-22 VITALS — BP 122/74 | HR 65 | Temp 98.0°F | Ht 71.0 in | Wt 286.0 lb

## 2016-03-22 DIAGNOSIS — B370 Candidal stomatitis: Secondary | ICD-10-CM | POA: Insufficient documentation

## 2016-03-22 DIAGNOSIS — Z23 Encounter for immunization: Secondary | ICD-10-CM

## 2016-03-22 DIAGNOSIS — B999 Unspecified infectious disease: Secondary | ICD-10-CM

## 2016-03-22 DIAGNOSIS — E1169 Type 2 diabetes mellitus with other specified complication: Secondary | ICD-10-CM

## 2016-03-22 DIAGNOSIS — E785 Hyperlipidemia, unspecified: Secondary | ICD-10-CM | POA: Diagnosis not present

## 2016-03-22 HISTORY — DX: Candidal stomatitis: B37.0

## 2016-03-22 HISTORY — DX: Unspecified infectious disease: B99.9

## 2016-03-22 MED ORDER — FLUCONAZOLE 100 MG PO TABS: 100.0000 mg | ORAL_TABLET | Freq: Every day | ORAL | 3 refills | Status: DC

## 2016-03-22 MED ORDER — ROSUVASTATIN CALCIUM 5 MG PO TABS: 5.0000 mg | ORAL_TABLET | Freq: Every day | ORAL | 3 refills | Status: DC

## 2016-03-22 NOTE — Progress Notes (Signed)
Reason for Consult: recurrent candidiasis of mouth  Requesting Physician: Dr. Redmond School  Subjective:    Patient ID: [name], male    DOB: 1955-10-24, 60 y.o.   MRN: [id number]  HPI  60 year old Caucasian man with DM, obesity, OSA pilonidal cyst, and recurrent lesions in his mouth for the past year. The lesions will be whitish and occur on sides of his tongue as well on sides of his mouth. They are painful esp if he eats peppery or spicy foods. They can worsen and coalecse with more white lesions together. They have responded to fluconazole and other antifungals but not lamisil.  He underwent biopsy of lesion by his oral surgeon and it came back per his report c/w candida infection.  We are seeing him to work him up for apparent recurrent oral candidiasis. On exam he has a few lesions but certainly not something typical of florid thrush.  He has had a pilonidal cyst that is a chronic problem. He has had prior problems with sinusitis that have improved once it was discovered that he had allergies underlying this. He has problems with incontinence of his bladder. He does not otherwise have hx of recurrent infections that he knows of.     Past Medical History:  Diagnosis Date  . Anxiety   . Arthritis    back, shoulders, left great toe  . Candidiasis, mouth 03/22/2016  . Carpal tunnel syndrome of right wrist 10/2014  . Dental crowns present   . Depression   . Dyslipidemia   . GERD (gastroesophageal reflux disease)   . Hypertension    under control with med., per pt.; has been on med. x 15-20 yr.  . Insulin dependent diabetes mellitus ([place])   . Nummular eczema   . Obesity   . Recurrent infections 03/22/2016  . Sleep apnea    uses CPAP nightly    Past Surgical History:  Procedure Laterality Date  . CARDIAC CATHETERIZATION  12/30/2006   "mod. pulmonary HTN with preserved cardiac output and cardiac index"  . CARPAL TUNNEL RELEASE Right 10/14/2014   Procedure: RIGHT CARPAL  TUNNEL RELEASE;  Surgeon: Leanora Cover, MD;  Location: [place];  Service: Orthopedics;  Laterality: Right;  . CARPAL TUNNEL RELEASE Left 05/19/2015   Procedure: LEFT CARPAL TUNNEL RELEASE;  Surgeon: Leanora Cover, MD;  Location: [place];  Service: Orthopedics;  Laterality: Left;  . CYST EXCISION     scalp and back  . ESOPHAGOGASTRODUODENOSCOPY  03/25/2007  . FOOT SURGERY Left    revision traumatic amputation of foot  . ROUX-EN-Y PROCEDURE  07/28/2007   with lysis of adhesions    Family History  Problem Relation Age of Onset  . Heart disease Mother   . Cancer Mother 72    Multiple myeloma  . Diabetes Father   . Diabetes Brother   . Heart disease Brother 19    cardiomyopathy  . Heart disease Brother 54    MI  . Arthritis Paternal Grandmother   . Diabetes Paternal Grandmother   . Heart disease Paternal Grandmother   . Stroke Paternal Grandmother       Social History   Social History  . Marital status: Single    Spouse name: N/A  . Number of children: N/A  . Years of education: N/A   Social History Main Topics  . Smoking status: Former Smoker    Quit date: 07/03/2007  . Smokeless tobacco: Never Used  . Alcohol use Yes  Comment: social  . Drug use: No  . Sexual activity: Not Currently   Other Topics Concern  . None   Social History Narrative  . None    Allergies  Allergen Reactions  . Latex Itching     Current Outpatient Prescriptions:  .  aspirin EC 81 MG tablet, Take 81 mg by mouth at bedtime., Disp: , Rfl:  .  azelastine (ASTELIN) 0.1 % nasal spray, Use 1 spray in each nostril at bedtime, Disp: 30 mL, Rfl: 11 .  B Complex-C (B-COMPLEX WITH VITAMIN C) tablet, Take 1 tablet by mouth 2 (two) times daily., Disp: , Rfl:  .  Blood Glucose Monitoring Suppl (ONETOUCH VERIO IQ SYSTEM) W/DEVICE KIT, 1 each by Does not apply route daily., Disp: 1 kit, Rfl: 0 .  buPROPion (WELLBUTRIN SR) 200 MG 12 hr tablet, Take 1 tablet (200 mg  total) by mouth 2 (two) times daily., Disp: 180 tablet, Rfl: 3 .  calcium-vitamin D 250-100 MG-UNIT per tablet, Take 1 tablet by mouth 2 (two) times daily.  , Disp: , Rfl:  .  cetirizine (ZYRTEC) 10 MG tablet, Take 10 mg by mouth daily.  , Disp: , Rfl:  .  Desvenlafaxine ER 100 MG TB24, Take 1 tablet (100 mg total) by mouth daily., Disp: 90 tablet, Rfl: 3 .  empagliflozin (JARDIANCE) 25 MG TABS tablet, Take 25 mg by mouth daily., Disp: , Rfl:  .  insulin glargine (LANTUS) 100 UNIT/ML injection, Inject 76 Units into the skin at bedtime. , Disp: , Rfl:  .  lisinopril (PRINIVIL,ZESTRIL) 10 MG tablet, Take 10 mg by mouth daily.  , Disp: , Rfl:  .  metFORMIN (GLUCOPHAGE) 1000 MG tablet, Take 1,000 mg by mouth 2 (two) times daily with a meal.  , Disp: , Rfl:  .  Multiple Vitamins-Minerals (MULTIVITAMIN WITH MINERALS) tablet, Take 1 tablet by mouth 2 (two) times daily.  , Disp: , Rfl:  .  NEOMYCIN-POLYMYXIN-HYDROCORTISONE (CORTISPORIN) 1 % SOLN otic solution, Apply 1-2 drops to toe BID after soaking, Disp: 10 mL, Rfl: 1 .  omeprazole (PRILOSEC) 40 MG capsule, Take 1 capsule by mouth  daily, Disp: 90 capsule, Rfl: 3 .  ONETOUCH DELICA LANCETS 67E MISC, , Disp: , Rfl:  .  ONETOUCH VERIO test strip, , Disp: , Rfl:  .  terbinafine (LAMISIL) 250 MG tablet, Take 1 tablet (250 mg total) by mouth daily., Disp: 30 tablet, Rfl: 0 .  fluconazole (DIFLUCAN) 100 MG tablet, Take 1 tablet (100 mg total) by mouth daily., Disp: 30 tablet, Rfl: 3 .  rosuvastatin (CRESTOR) 5 MG tablet, Take 1 tablet (5 mg total) by mouth daily., Disp: 90 tablet, Rfl: 3  Current Facility-Administered Medications:  .  lidocaine (PF) (XYLOCAINE) 1 % injection 2 mL, 2 mL, Intradermal, Once, Denita Lung, MD    Review of Systems  Constitutional: Negative for chills and fever.  HENT: Positive for mouth sores. Negative for congestion and sore throat.   Eyes: Negative for photophobia.  Respiratory: Negative for cough, shortness of breath  and wheezing.   Cardiovascular: Negative for chest pain, palpitations and leg swelling.  Gastrointestinal: Negative for abdominal pain, blood in stool, constipation, diarrhea, nausea and vomiting.  Genitourinary: Negative for dysuria, flank pain and hematuria.  Musculoskeletal: Negative for back pain and myalgias.  Skin: Negative for rash.  Neurological: Negative for dizziness, weakness and headaches.  Hematological: Does not bruise/bleed easily.  Psychiatric/Behavioral: Negative for suicidal ideas.       Objective:   Physical  Exam  Constitutional: He is oriented to person, place, and time. He appears well-developed and well-nourished. No distress.  HENT:  Head: Normocephalic and atraumatic.  Mouth/Throat: No oropharyngeal exudate.  Eyes: Conjunctivae and EOM are normal. No scleral icterus.  Neck: Normal range of motion. Neck supple.  Cardiovascular: Normal rate and regular rhythm.   Pulmonary/Chest: Effort normal. No respiratory distress. He has no wheezes.  Abdominal: He exhibits no distension.  Musculoskeletal: He exhibits no edema or tenderness.  Neurological: He is alert and oriented to person, place, and time. He exhibits normal muscle tone. Coordination normal.  Skin: Skin is warm and dry. No rash noted. He is not diaphoretic. No erythema. No pallor.  Psychiatric: He has a normal mood and affect. His behavior is normal. Judgment and thought content normal.    OP: he has a few white lesions on ulcerated areas on tongue as seen below 03/22/16:          Assessment & Plan:    Possible recurrent oral candidiasis: very unusual presentation. I wonder if he has a different diagnosis but why does he respond to antifungals unless due to placebo effect. Biopsy showing candida doesn't mean  A lot to me but I dont have it in front of me. For now we will work him up for recurrent oral candidiasis. He does not have recurrent exposure to antibacterial antibiotics or steroids inhaled or  systemic. Other than his DM he does not have an obvious immune deficiency.  There is association with MPO deficiency so we will check this and his neutrophil burst, immunoglobulins.   I will recheck HIV though he has not been sexually active for years  I will give him a month long course of fluconazole to see how he responds to this and followup on his labs results.    STI screening: he has not been sexually active for years but previously had sex with men. I recommended PrEP if and when he becomes sexually active again but he thinks this is unlikely due to his depression, his pilonidal cyst and mx other issues.  I will retest him for HIV along with HCV  Hyperlipidemia: there is interaction with zocor and his fluconazole that I may want to use long term. Our pharmacist Onnie Boer recommended change to crestor and we took liberty of changing this.  I spent greater than 60 minutes with the patient including greater than 50% of time in face to face counsel of the patient re his oral lesions which might be recurrent oral candidiasis, his hyperlipidemia, STI screening and in coordination of his  care.

## 2016-03-22 NOTE — Progress Notes (Signed)
Patient ID: Timothy Geraldsnthony L Sutley, male   DOB: March 14, 1956, 60 y.o.   MRN: [id number] HPI: [name] is a 60 y.o. male who is here for his eval of the leukoplakia of the tongue.   Allergies: Allergies  Allergen Reactions  . Latex Itching    Vitals: Temp: 98 F (36.7 C) (09/21 1422) Temp Source: Oral (09/21 1422) BP: 122/74 (09/21 1422) Pulse Rate: 65 (09/21 1422)  Past Medical History: Past Medical History:  Diagnosis Date  . Anxiety   . Arthritis    back, shoulders, left great toe  . Candidiasis, mouth 03/22/2016  . Carpal tunnel syndrome of right wrist 10/2014  . Dental crowns present   . Depression   . Dyslipidemia   . GERD (gastroesophageal reflux disease)   . Hypertension    under control with med., per pt.; has been on med. x 15-20 yr.  . Insulin dependent diabetes mellitus ([facility])   . Nummular eczema   . Obesity   . Recurrent infections 03/22/2016  . Sleep apnea    uses CPAP nightly    Social History: Social History   Social History  . Marital status: Single    Spouse name: N/A  . Number of children: N/A  . Years of education: N/A   Social History Main Topics  . Smoking status: Former Smoker    Quit date: 07/03/2007  . Smokeless tobacco: Never Used  . Alcohol use Yes     Comment: social  . Drug use: No  . Sexual activity: Not Currently   Other Topics Concern  . None   Social History Narrative  . None    Previous Regimen:   Current Regimen:   Labs: No results found for: HIV1RNAQUANT, HIV1RNAVL, CD4TABS, HEPBSAB, HEPBSAG, HCVAB  CrCl: CrCl cannot be calculated (Patient's most recent lab result is older than the maximum 21 days allowed.).  Lipids:    Component Value Date/Time   CHOL 115 11/13/2010 1112   TRIG 83 11/13/2010 1112   HDL 38 (L) 11/13/2010 1112   CHOLHDL 3.0 11/13/2010 1112   VLDL 17 11/13/2010 1112   LDLCALC 60 11/13/2010 1112    Assessment:  [facility].  Dr. Daiva EvesVan Dam plan to treat him with an extended course of fluconazole. Unfortunately, he is on simvastatin which would interact with his fluconazole. This would increase the risk of myopathy and rhabdo. D/w with pt about start using crestor instead to avoid the increase of rhabdo. After discussing with the pt, he would rather stay on the crestor long term instead. [facility].  Recommendations:  Change zocor to crestor 5mg  PO qday  Clide CliffPham, Faheem Ziemann Quang, PharmD, BCPS, AAHIVP, CPP Clinical Infectious Disease Pharmacist [facility] 03/22/2016, 3:16 PM

## 2016-03-23 LAB — CBC WITH DIFFERENTIAL/PLATELET
Basophils Absolute: 101 cells/uL (ref 0–200)
Basophils Relative: 1 %
Eosinophils Absolute: 101 cells/uL (ref 15–500)
Eosinophils Relative: 1 %
HCT: 35.1 % — ABNORMAL LOW (ref 38.5–50.0)
Hemoglobin: 10.1 g/dL — ABNORMAL LOW (ref 13.2–17.1)
Lymphocytes Relative: 27 %
Lymphs Abs: 2727 cells/uL (ref 850–3900)
MCH: 18.5 pg — ABNORMAL LOW (ref 27.0–33.0)
MCHC: 28.8 g/dL — ABNORMAL LOW (ref 32.0–36.0)
MCV: 63.9 fL — ABNORMAL LOW (ref 80.0–100.0)
MPV: 9.1 fL (ref 7.5–12.5)
Monocytes Absolute: 707 cells/uL (ref 200–950)
Monocytes Relative: 7 %
Neutro Abs: 6464 cells/uL (ref 1500–7800)
Neutrophils Relative %: 64 %
Platelets: 389 10*3/uL (ref 140–400)
RBC: 5.47 MIL/uL (ref 4.20–5.80)
RDW: 20.3 % — ABNORMAL HIGH (ref 11.0–15.0)
WBC: 10.1 10*3/uL (ref 3.8–10.8)

## 2016-03-23 LAB — HIV ANTIBODY (ROUTINE TESTING W REFLEX): HIV 1&2 Ab, 4th Generation: NONREACTIVE

## 2016-03-23 LAB — IGG, IGA, IGM
IgA: 111 mg/dL (ref 81–463)
IgG (Immunoglobin G), Serum: 1020 mg/dL (ref 694–1618)
IgM, Serum: 347 mg/dL — ABNORMAL HIGH (ref 48–271)

## 2016-03-23 LAB — IGE: IgE (Immunoglobulin E), Serum: 55 kU/L (ref <115)

## 2016-03-23 LAB — HEPATITIS B SURFACE ANTIGEN: Hepatitis B Surface Ag: NEGATIVE

## 2016-03-23 LAB — HEPATITIS C ANTIBODY: HCV Ab: NEGATIVE

## 2016-03-25 LAB — NEUTROPHIL OXIDATIVE BURST: % OXIDATION POSITIVE NEUTROPHILS: 86 %

## 2016-03-29 ENCOUNTER — Telehealth: Payer: Self-pay | Admitting: *Deleted

## 2016-03-29 NOTE — Telephone Encounter (Signed)
Pt states he called this week earlier, toenail procedure area looked infected now looks better, but still has some drainage. I apologized, that I had not gotten the message, but would help. Pt states he is using betadine soaks and antibiotic drops and neosporin after.  I told pt to stop betadine, begin epsom salt 1/2 cup in 1 qt of water bid and use only the antibiotic drop and a fabric bandaid and I would transfer him to schedulers to get an appt. Pt states understanding.

## 2016-04-04 LAB — OTHER SOLSTAS TEST: COST - Referred Assay: 2013273

## 2016-04-10 ENCOUNTER — Ambulatory Visit (INDEPENDENT_AMBULATORY_CARE_PROVIDER_SITE_OTHER): Payer: 59 | Admitting: Podiatry

## 2016-04-10 DIAGNOSIS — Z79899 Other long term (current) drug therapy: Secondary | ICD-10-CM

## 2016-04-10 DIAGNOSIS — Z9229 Personal history of other drug therapy: Secondary | ICD-10-CM

## 2016-04-10 MED ORDER — TERBINAFINE HCL 250 MG PO TABS: 250.0000 mg | ORAL_TABLET | Freq: Every day | ORAL | 1 refills | Status: DC

## 2016-04-11 NOTE — Progress Notes (Signed)
He presents today for follow-up of his Lamisil therapy for his onychomycosis. He states it seems to be almost grown out. No complications taken medication.  Objective: Vital signs are stable alert and oriented 3 has about 85% clearance of his nail plates of the left foot. There is growing out quite nicely.  Assessment: Resolving onychomycosis.  Plan: Requested that he continue every other day therapy for the next 2 months and I will follow-up with him in 3 months. We wrote a prescription for Lamisil 250 mg tablets #30 one by mouth every other day.

## 2016-04-30 ENCOUNTER — Telehealth: Payer: Self-pay | Admitting: *Deleted

## 2016-04-30 NOTE — Telephone Encounter (Signed)
Ok to refill   thanks

## 2016-04-30 NOTE — Telephone Encounter (Signed)
Patient called stating his oral "yeast" has returned and Dr. Ninetta LightsHatcher had previously given him a course of fluconazole. [facility]. [facility].

## 2016-04-30 NOTE — Telephone Encounter (Signed)
Do you want me to see him to order another course of fluconazole?

## 2016-05-01 NOTE — Telephone Encounter (Signed)
Thanks Minh! 

## 2016-05-01 NOTE — Telephone Encounter (Signed)
Minh will call patient to refill this Rx

## 2016-05-03 ENCOUNTER — Encounter: Payer: Self-pay | Admitting: Infectious Disease

## 2016-05-07 ENCOUNTER — Telehealth: Payer: Self-pay | Admitting: Pharmacist Clinician (PhC)/ Clinical Pharmacy Specialist

## 2016-05-07 NOTE — Telephone Encounter (Signed)
Called to come in to give another course of fluconazole and weekly prophylaxis.

## 2016-05-09 ENCOUNTER — Ambulatory Visit: Payer: 59 | Admitting: Infectious Disease

## 2016-05-17 ENCOUNTER — Other Ambulatory Visit: Payer: Self-pay | Admitting: Family Medicine

## 2016-05-21 ENCOUNTER — Ambulatory Visit (INDEPENDENT_AMBULATORY_CARE_PROVIDER_SITE_OTHER): Payer: 59 | Admitting: Infectious Disease

## 2016-05-21 ENCOUNTER — Encounter: Payer: Self-pay | Admitting: Infectious Disease

## 2016-05-21 VITALS — BP 135/76 | HR 86 | Temp 98.5°F | Ht 71.0 in | Wt 279.0 lb

## 2016-05-21 DIAGNOSIS — B999 Unspecified infectious disease: Secondary | ICD-10-CM | POA: Diagnosis not present

## 2016-05-21 DIAGNOSIS — B370 Candidal stomatitis: Secondary | ICD-10-CM | POA: Diagnosis not present

## 2016-05-21 DIAGNOSIS — F332 Major depressive disorder, recurrent severe without psychotic features: Secondary | ICD-10-CM

## 2016-05-21 MED ORDER — FLUCONAZOLE 100 MG PO TABS: 100.0000 mg | ORAL_TABLET | ORAL | 3 refills | Status: DC

## 2016-05-21 NOTE — Progress Notes (Signed)
Subjective:   Chief complaint: Follow-up for recurrent oral candidiasis also suffering from depression   Patient ID: [name], male    DOB: 10/07/1955, 60 y.o.   MRN: [id number]  HPI  60 year old Caucasian man with DM, obesity, OSA pilonidal cyst, and recurrent lesions in his mouth for the past year. The lesions will be whitish and occur on sides of his tongue as well on sides of his mouth. They are painful esp if he eats peppery or spicy foods. They can worsen and coalecse with more white lesions together. They have responded to fluconazole and other antifungals but not lamisil.  He underwent biopsy of lesion by his oral surgeon and it came back per his report c/w candida infection.  We are seeing him to work him up for apparent recurrent oral candidiasis. On exam he has a few lesions but certainly not something typical of florid thrush.  Workup for immunodeficiency and could not find reason for his recurrent Candida. He does have diabetes mellitus which possibly may not be well controlled I don't have access to his A1c data. We gave him a month of fluconazole which cause resolution of his thrush. However when he came off the fluconazole at recurred. He is now on a two-week course again of fluconazole and his thrush has resolved again. I want to place him on once a week fluconazole for prophylaxis to see if this can prevent it from recurring.  He is suffering from significant depressive symptoms but has no active or passive suicidal ideation. We'll help him get plugged in with mental health and I will make sure that he follows up with his primary care physician.     Past Medical History:  Diagnosis Date  . Anxiety   . Arthritis    back, shoulders, left great toe  . Candidiasis, mouth 03/22/2016  . Carpal tunnel syndrome of right wrist 10/2014  . Dental crowns present   . Depression   . Dyslipidemia   . GERD (gastroesophageal reflux disease)   . Hypertension    under control  with med., per pt.; has been on med. x 15-20 yr.  . Insulin dependent diabetes mellitus ([place])   . Nummular eczema   . Obesity   . Recurrent infections 03/22/2016  . Sleep apnea    uses CPAP nightly    Past Surgical History:  Procedure Laterality Date  . CARDIAC CATHETERIZATION  12/30/2006   "mod. pulmonary HTN with preserved cardiac output and cardiac index"  . CARPAL TUNNEL RELEASE Right 10/14/2014   Procedure: RIGHT CARPAL TUNNEL RELEASE;  Surgeon: Leanora Cover, MD;  Location: [place];  Service: Orthopedics;  Laterality: Right;  . CARPAL TUNNEL RELEASE Left 05/19/2015   Procedure: LEFT CARPAL TUNNEL RELEASE;  Surgeon: Leanora Cover, MD;  Location: [place];  Service: Orthopedics;  Laterality: Left;  . CYST EXCISION     scalp and back  . ESOPHAGOGASTRODUODENOSCOPY  03/25/2007  . FOOT SURGERY Left    revision traumatic amputation of foot  . ROUX-EN-Y PROCEDURE  07/28/2007   with lysis of adhesions    Family History  Problem Relation Age of Onset  . Heart disease Mother   . Cancer Mother 103    Multiple myeloma  . Diabetes Father   . Diabetes Brother   . Heart disease Brother 28    cardiomyopathy  . Heart disease Brother 61    MI  . Arthritis Paternal Grandmother   . Diabetes Paternal  Grandmother   . Heart disease Paternal Grandmother   . Stroke Paternal Grandmother       Social History   Social History  . Marital status: Single    Spouse name: N/A  . Number of children: N/A  . Years of education: N/A   Social History Main Topics  . Smoking status: Former Smoker    Quit date: 07/03/2007  . Smokeless tobacco: Never Used  . Alcohol use Yes     Comment: seldom  . Drug use: No  . Sexual activity: Not Currently   Other Topics Concern  . Not on file   Social History Narrative  . No narrative on file    Allergies  Allergen Reactions  . Latex Itching     Current Outpatient Prescriptions:  .  aspirin EC 81 MG tablet, Take 81 mg  by mouth at bedtime., Disp: , Rfl:  .  azelastine (ASTELIN) 0.1 % nasal spray, USE 1 SPRAY IN EACH NOSTRIL AT BEDTIME, Disp: 30 mL, Rfl: 5 .  B Complex-C (B-COMPLEX WITH VITAMIN C) tablet, Take 1 tablet by mouth 2 (two) times daily., Disp: , Rfl:  .  Blood Glucose Monitoring Suppl (ONETOUCH VERIO IQ SYSTEM) W/DEVICE KIT, 1 each by Does not apply route daily., Disp: 1 kit, Rfl: 0 .  buPROPion (WELLBUTRIN SR) 200 MG 12 hr tablet, Take 1 tablet (200 mg total) by mouth 2 (two) times daily., Disp: 180 tablet, Rfl: 3 .  calcium-vitamin D 250-100 MG-UNIT per tablet, Take 1 tablet by mouth 2 (two) times daily.  , Disp: , Rfl:  .  cetirizine (ZYRTEC) 10 MG tablet, Take 10 mg by mouth daily.  , Disp: , Rfl:  .  desvenlafaxine (PRISTIQ) 50 MG 24 hr tablet, Take 50 mg by mouth daily. Unsure of dose (mental health), Disp: , Rfl:  .  empagliflozin (JARDIANCE) 25 MG TABS tablet, Take 25 mg by mouth daily., Disp: , Rfl:  .  fluconazole (DIFLUCAN) 100 MG tablet, Take 1 tablet (100 mg total) by mouth as directed. takke daily x 2 weeks then one tab weekly to prevent, Disp: 30 tablet, Rfl: 3 .  insulin glargine (LANTUS) 100 UNIT/ML injection, Inject 76 Units into the skin at bedtime. , Disp: , Rfl:  .  lisinopril (PRINIVIL,ZESTRIL) 10 MG tablet, Take 10 mg by mouth daily.  , Disp: , Rfl:  .  metFORMIN (GLUCOPHAGE) 1000 MG tablet, Take 1,000 mg by mouth 2 (two) times daily with a meal.  , Disp: , Rfl:  .  Multiple Vitamins-Minerals (MULTIVITAMIN WITH MINERALS) tablet, Take 1 tablet by mouth 2 (two) times daily.  , Disp: , Rfl:  .  omeprazole (PRILOSEC) 40 MG capsule, Take 1 capsule by mouth  daily, Disp: 90 capsule, Rfl: 3 .  ONETOUCH DELICA LANCETS 02H MISC, , Disp: , Rfl:  .  ONETOUCH VERIO test strip, , Disp: , Rfl:  .  rosuvastatin (CRESTOR) 5 MG tablet, Take 1 tablet (5 mg total) by mouth daily., Disp: 90 tablet, Rfl: 3 .  terbinafine (LAMISIL) 250 MG tablet, Take 1 tablet (250 mg total) by mouth daily., Disp: 30  tablet, Rfl: 0 .  Desvenlafaxine ER 100 MG TB24, Take 1 tablet (100 mg total) by mouth daily., Disp: 90 tablet, Rfl: 3    Review of Systems  Constitutional: Negative for chills and fever.  HENT: Positive for mouth sores. Negative for congestion and sore throat.   Eyes: Negative for photophobia.  Respiratory: Negative for cough, shortness of breath and  wheezing.   Cardiovascular: Negative for chest pain, palpitations and leg swelling.  Gastrointestinal: Negative for abdominal pain, blood in stool, constipation, diarrhea, nausea and vomiting.  Genitourinary: Negative for dysuria, flank pain and hematuria.  Musculoskeletal: Negative for back pain and myalgias.  Skin: Negative for rash.  Neurological: Negative for dizziness, weakness and headaches.  Hematological: Does not bruise/bleed easily.  Psychiatric/Behavioral: Positive for decreased concentration and dysphoric mood. Negative for self-injury and suicidal ideas. The patient is nervous/anxious.        Objective:   Physical Exam  Constitutional: He is oriented to person, place, and time. He appears well-developed and well-nourished. No distress.  HENT:  Head: Normocephalic and atraumatic.  Mouth/Throat: No oropharyngeal exudate.  Eyes: Conjunctivae and EOM are normal. No scleral icterus.  Neck: Normal range of motion. Neck supple.  Cardiovascular: Normal rate and regular rhythm.   Pulmonary/Chest: Effort normal. No respiratory distress. He has no wheezes.  Abdominal: He exhibits no distension.  Musculoskeletal: He exhibits no edema or tenderness.  Neurological: He is alert and oriented to person, place, and time. He exhibits normal muscle tone. Coordination normal.  Skin: Skin is warm and dry. No rash noted. He is not diaphoretic. No erythema. No pallor.  Psychiatric: He has a normal mood and affect. His behavior is normal. Judgment and thought content normal.    OP: he has a few white lesions on ulcerated areas on tongue as  seen below 03/22/16:     05/21/2016: No lesions seen today on fluconazole         Assessment & Plan:    Possible recurrent oral candidiasis: very unusual presentation. I wonder if he has a different diagnosis but He does respond to a sole therapy will put him on once weekly fluconazole 100 mg stable prevent recurrence. If he continues to have recurrences will treat him with 2 week courses.     Hyperlipidemia: Now on Crestor due to drug interaction with fluconazole with Zocor  Depression: Contracted for safety no passive or active suicidal ideation will get plugged in with a psychiatrist and Avastin follow-up with primary care as well.    I spent greater tha 25 minutes with the patient including greater than 50% of time in face to face counsel of the patient re his oral lesions which might be recurrent oral candidiasis, pression and in coordination of his  care.

## 2016-05-22 ENCOUNTER — Ambulatory Visit: Payer: 59 | Admitting: Podiatry

## 2016-07-09 ENCOUNTER — Telehealth: Payer: Self-pay | Admitting: Family Medicine

## 2016-07-09 MED ORDER — DESVENLAFAXINE SUCCINATE ER 50 MG PO TB24: 50.0000 mg | ORAL_TABLET | Freq: Every day | ORAL | 0 refills | Status: DC

## 2016-07-09 NOTE — Telephone Encounter (Signed)
Rcvd refill request for Pristiq

## 2016-07-10 ENCOUNTER — Ambulatory Visit: Payer: 59 | Admitting: Podiatry

## 2016-07-10 ENCOUNTER — Telehealth: Payer: Self-pay | Admitting: Internal Medicine

## 2016-07-10 NOTE — Telephone Encounter (Signed)
Dr. Susann GivensLalonde got a request about clarifications on pt's PRISTIQ med. Dr. Susann GivensLalonde wants to know how he is taking this med? Pristiq 100mg  daily or 50mg  daily or taking something different. Please Verify and let Dr. Susann GivensLalonde know.

## 2016-07-24 ENCOUNTER — Encounter: Payer: Self-pay | Admitting: Podiatry

## 2016-07-24 ENCOUNTER — Ambulatory Visit (INDEPENDENT_AMBULATORY_CARE_PROVIDER_SITE_OTHER): Payer: 59 | Admitting: Podiatry

## 2016-07-24 DIAGNOSIS — L603 Nail dystrophy: Secondary | ICD-10-CM

## 2016-07-25 DIAGNOSIS — G4733 Obstructive sleep apnea (adult) (pediatric): Secondary | ICD-10-CM | POA: Diagnosis not present

## 2016-07-25 NOTE — Progress Notes (Signed)
He presents today for follow-up of his onychomycosis. He states that still see a small amount at the distalmost aspect of the nails. He had no problems taking the medication.  Objective: Vital signs are stable alert and oriented 3. Pulses are palpable. Toenails appear to be growing out very nicely with exception of the distalmost aspect of the toenail.  Assessment: Long-term therapy for onychomycosis with Lamisil.  Plan: He'll continue to take Lamisil every other day for 2 months and a prescription was provided.

## 2016-07-31 ENCOUNTER — Encounter: Payer: Self-pay | Admitting: Podiatry

## 2016-07-31 ENCOUNTER — Ambulatory Visit: Payer: 59 | Admitting: Family Medicine

## 2016-08-06 DIAGNOSIS — Z9884 Bariatric surgery status: Secondary | ICD-10-CM | POA: Insufficient documentation

## 2016-08-06 DIAGNOSIS — E1165 Type 2 diabetes mellitus with hyperglycemia: Secondary | ICD-10-CM | POA: Diagnosis not present

## 2016-08-06 DIAGNOSIS — E782 Mixed hyperlipidemia: Secondary | ICD-10-CM | POA: Diagnosis not present

## 2016-08-22 ENCOUNTER — Encounter: Payer: Self-pay | Admitting: Infectious Disease

## 2016-08-23 ENCOUNTER — Other Ambulatory Visit: Payer: Self-pay | Admitting: *Deleted

## 2016-08-23 DIAGNOSIS — B370 Candidal stomatitis: Secondary | ICD-10-CM

## 2016-08-23 MED ORDER — FLUCONAZOLE 100 MG PO TABS: 100.0000 mg | ORAL_TABLET | Freq: Every day | ORAL | 0 refills | Status: DC

## 2016-08-23 NOTE — Telephone Encounter (Signed)
RX sent for 2 weeks. Patient notified. Thanks!  Lyman BishopMichelle  Ok to fill for 2 weeks worth. thanks  ----- Message -----  From: Andree CossMichelle M Fannie Gathright, RN  Sent: 08/23/2016 11:20 AM  To: Gardiner Barefootobert W Comer, MD  Subject: FW: Non-Urgent Medical Question           Dr Clinton GallantVan Dam's patient, last seen November. On weekly prophylactic fluconazole, note said to give him treatment 2 weeks at a time. Ok to fill? Please advise.  Thank you!!  Marcelino DusterMichelle    ----- Message -----  From: Spero GeraldsAnthony L Deadwyler  Sent: 08/22/2016  8:29 PM  To: [facility] Clinical Pool  Subject: Non-Urgent Medical Question             Please refill my fluconozale (sp?). Once a week isn't keeping the infection at bay. I need to start back daily for at least a couple of weeks and I only have 4 pills left. My current prescription is only for 13 pills and isn't due for renewal for 44 days. I really don't want to wait that long. I already have spots on the inside bottom of my lower lip. That is where it usually starts. Also, I asked my endocrinologist, Dr. Leslie DalesAltheimer, to send you a copy of my latest diabetes test results. He suggested that I ask if my infection is affected by my cpap machine. I don't remember if we discussed that or not. Thank you, and please have a happy and healthy day!

## 2016-08-30 ENCOUNTER — Ambulatory Visit (INDEPENDENT_AMBULATORY_CARE_PROVIDER_SITE_OTHER): Payer: 59 | Admitting: Infectious Disease

## 2016-08-30 ENCOUNTER — Encounter: Payer: Self-pay | Admitting: Infectious Disease

## 2016-08-30 VITALS — BP 116/70 | HR 79 | Temp 98.2°F | Ht 72.0 in | Wt 278.0 lb

## 2016-08-30 DIAGNOSIS — B370 Candidal stomatitis: Secondary | ICD-10-CM | POA: Diagnosis not present

## 2016-08-30 DIAGNOSIS — H938X1 Other specified disorders of right ear: Secondary | ICD-10-CM

## 2016-08-30 DIAGNOSIS — H938X9 Other specified disorders of ear, unspecified ear: Secondary | ICD-10-CM

## 2016-08-30 DIAGNOSIS — B351 Tinea unguium: Secondary | ICD-10-CM | POA: Insufficient documentation

## 2016-08-30 HISTORY — DX: Other specified disorders of ear, unspecified ear: H93.8X9

## 2016-08-30 HISTORY — DX: Tinea unguium: B35.1

## 2016-08-30 MED ORDER — FLUCONAZOLE 100 MG PO TABS: ORAL_TABLET | ORAL | 11 refills | Status: DC

## 2016-08-30 NOTE — Progress Notes (Signed)
Subjective:   Chief complaint: Follow-up for recurrent oral lesions presumed to be candidiasis also c/o right ear fullness   Patient ID: [name], male    DOB: 24-Nov-1955, 61 y.o.   MRN: [id number]  HPI  61 year old man with DM, obesity, OSA pilonidal cyst, and recurrent lesions in his mouth for the past year. The lesions will be whitish and occur on sides of his tongue as well on sides of his mouth. They are painful esp if he eats peppery or spicy foods. They can worsen and coalecse with more white lesions together. They have responded to fluconazole and other antifungals but not lamisil.  He underwent biopsy of lesion by his oral surgeon and it came back per his report c/w candida infection.  We saw him to work him up for apparent recurrent oral candidiasis. On exam he has a few lesions but certainly not something typical of florid thrush.  Workup for immunodeficiency and could not find reason for his recurrent Candida. He does have diabetes mellitus which possibly may not be well controlled I don't have access to his A1c data. We gave him a month of fluconazole which cause resolution of his thrush. However when he came off the fluconazole at recurred.   We put him on once weekly prophylaxis but he still had recurrence after a few weeks. I am fairly doubtful that this is the true diagnosis though he improves with therapy.    Past Medical History:  Diagnosis Date  . Anxiety   . Arthritis    back, shoulders, left great toe  . Candidiasis, mouth 03/22/2016  . Carpal tunnel syndrome of right wrist 10/2014  . Dental crowns present   . Depression   . Dyslipidemia   . GERD (gastroesophageal reflux disease)   . Hypertension    under control with med., per pt.; has been on med. x 15-20 yr.  . Insulin dependent diabetes mellitus ([place])   . Nummular eczema   . Obesity   . Recurrent infections 03/22/2016  . Sleep apnea    uses CPAP nightly    Past Surgical History:  Procedure  Laterality Date  . CARDIAC CATHETERIZATION  12/30/2006   "mod. pulmonary HTN with preserved cardiac output and cardiac index"  . CARPAL TUNNEL RELEASE Right 10/14/2014   Procedure: RIGHT CARPAL TUNNEL RELEASE;  Surgeon: Leanora Cover, MD;  Location: [facility];  Service: Orthopedics;  Laterality: Right;  . CARPAL TUNNEL RELEASE Left 05/19/2015   Procedure: LEFT CARPAL TUNNEL RELEASE;  Surgeon: Leanora Cover, MD;  Location: [facility];  Service: Orthopedics;  Laterality: Left;  . CYST EXCISION     scalp and back  . ESOPHAGOGASTRODUODENOSCOPY  03/25/2007  . FOOT SURGERY Left    revision traumatic amputation of foot  . ROUX-EN-Y PROCEDURE  07/28/2007   with lysis of adhesions    Family History  Problem Relation Age of Onset  . Heart disease Mother   . Cancer Mother 70    Multiple myeloma  . Diabetes Father   . Diabetes Brother   . Heart disease Brother 27    cardiomyopathy  . Heart disease Brother 24    MI  . Arthritis Paternal Grandmother   . Diabetes Paternal Grandmother   . Heart disease Paternal Grandmother   . Stroke Paternal Grandmother       Social History   Social History  . Marital status: Single    Spouse name: N/A  . Number of  children: N/A  . Years of education: N/A   Social History Main Topics  . Smoking status: Former Smoker    Quit date: 07/03/2007  . Smokeless tobacco: Never Used  . Alcohol use Yes     Comment: seldom  . Drug use: No  . Sexual activity: Not Currently   Other Topics Concern  . Not on file   Social History Narrative  . No narrative on file    Allergies  Allergen Reactions  . Latex Itching     Current Outpatient Prescriptions:  .  aspirin EC 81 MG tablet, Take 81 mg by mouth at bedtime., Disp: , Rfl:  .  azelastine (ASTELIN) 0.1 % nasal spray, USE 1 SPRAY IN EACH NOSTRIL AT BEDTIME, Disp: 30 mL, Rfl: 5 .  B Complex-C (B-COMPLEX WITH VITAMIN C) tablet, Take 1 tablet by mouth 2 (two) times daily., Disp: ,  Rfl:  .  Blood Glucose Monitoring Suppl (ONETOUCH VERIO IQ SYSTEM) W/DEVICE KIT, 1 each by Does not apply route daily., Disp: 1 kit, Rfl: 0 .  buPROPion (WELLBUTRIN SR) 200 MG 12 hr tablet, Take 1 tablet (200 mg total) by mouth 2 (two) times daily., Disp: 180 tablet, Rfl: 3 .  calcium-vitamin D 250-100 MG-UNIT per tablet, Take 1 tablet by mouth 2 (two) times daily.  , Disp: , Rfl:  .  cetirizine (ZYRTEC) 10 MG tablet, Take 10 mg by mouth daily.  , Disp: , Rfl:  .  desvenlafaxine (PRISTIQ) 50 MG 24 hr tablet, Take 1 tablet (50 mg total) by mouth daily. Unsure of dose (mental health), Disp: 90 tablet, Rfl: 0 .  Desvenlafaxine ER 100 MG TB24, Take 1 tablet (100 mg total) by mouth daily., Disp: 90 tablet, Rfl: 3 .  empagliflozin (JARDIANCE) 25 MG TABS tablet, Take 25 mg by mouth daily., Disp: , Rfl:  .  fluconazole (DIFLUCAN) 100 MG tablet, Take 1 tablet (100 mg total) by mouth daily. For 2 weeks, Disp: 14 tablet, Rfl: 0 .  insulin glargine (LANTUS) 100 UNIT/ML injection, Inject 76 Units into the skin at bedtime. , Disp: , Rfl:  .  lisinopril (PRINIVIL,ZESTRIL) 10 MG tablet, Take 10 mg by mouth daily.  , Disp: , Rfl:  .  metFORMIN (GLUCOPHAGE) 1000 MG tablet, Take 1,000 mg by mouth 2 (two) times daily with a meal.  , Disp: , Rfl:  .  Multiple Vitamins-Minerals (MULTIVITAMIN WITH MINERALS) tablet, Take 1 tablet by mouth 2 (two) times daily.  , Disp: , Rfl:  .  omeprazole (PRILOSEC) 40 MG capsule, Take 1 capsule by mouth  daily, Disp: 90 capsule, Rfl: 3 .  ONETOUCH DELICA LANCETS 26V MISC, , Disp: , Rfl:  .  ONETOUCH VERIO test strip, , Disp: , Rfl:  .  rosuvastatin (CRESTOR) 5 MG tablet, Take 1 tablet (5 mg total) by mouth daily., Disp: 90 tablet, Rfl: 3 .  terbinafine (LAMISIL) 250 MG tablet, Take 1 tablet (250 mg total) by mouth daily., Disp: 30 tablet, Rfl: 0    Review of Systems  Constitutional: Negative for chills and fever.  HENT: Positive for ear pain and mouth sores. Negative for congestion  and sore throat.   Eyes: Negative for photophobia.  Respiratory: Negative for cough, shortness of breath and wheezing.   Cardiovascular: Negative for chest pain, palpitations and leg swelling.  Gastrointestinal: Negative for abdominal pain, blood in stool, constipation, diarrhea, nausea and vomiting.  Genitourinary: Negative for dysuria, flank pain and hematuria.  Musculoskeletal: Negative for back pain and myalgias.  Skin: Negative for rash.  Neurological: Negative for dizziness, weakness and headaches.  Hematological: Does not bruise/bleed easily.  Psychiatric/Behavioral: Positive for decreased concentration and dysphoric mood. Negative for self-injury and suicidal ideas. The patient is nervous/anxious.        Objective:   Physical Exam  Constitutional: He is oriented to person, place, and time. He appears well-developed and well-nourished. No distress.  HENT:  Head: Normocephalic and atraumatic.  Right Ear: A middle ear effusion is present.  Mouth/Throat: No oropharyngeal exudate.  Eyes: Conjunctivae and EOM are normal. No scleral icterus.  Neck: Normal range of motion. Neck supple.  Cardiovascular: Normal rate and regular rhythm.   Pulmonary/Chest: Effort normal. No respiratory distress. He has no wheezes.  Abdominal: He exhibits no distension.  Musculoskeletal: He exhibits no edema or tenderness.  Neurological: He is alert and oriented to person, place, and time. He exhibits normal muscle tone. Coordination normal.  Skin: Skin is warm and dry. No rash noted. He is not diaphoretic. No erythema. No pallor.  Psychiatric: He has a normal mood and affect. His behavior is normal. Judgment and thought content normal.    OP: he has a few white lesions on ulcerated areas on tongue as seen below 03/22/16:     05/21/2016: No lesions seen today on fluconazole         Assessment & Plan:    Possible recurrent oral candidiasis: very unusual presentation. I wonder if he has a  different diagnosis but He does respond to azole therapy   Rewrote rx for fluconazole to take once weekly and then for 2 weeks if necessary.   It would be nice to trial him off these meds again at some point. I will for now let him continue and then RTC in one year  Ear fullness: likely due to alergies   I spent greater tha 25 minutes with the patient including greater than 50% of time in face to face counsel of the patient re his oral lesions which might be recurrent oral candidiasis, ear fullness and in coordination of his  care.

## 2016-09-04 ENCOUNTER — Encounter: Payer: Self-pay | Admitting: Family Medicine

## 2016-09-04 ENCOUNTER — Ambulatory Visit (INDEPENDENT_AMBULATORY_CARE_PROVIDER_SITE_OTHER): Payer: 59 | Admitting: Family Medicine

## 2016-09-04 VITALS — BP 124/80 | HR 70 | Wt 276.0 lb

## 2016-09-04 DIAGNOSIS — H938X1 Other specified disorders of right ear: Secondary | ICD-10-CM | POA: Diagnosis not present

## 2016-09-04 NOTE — Patient Instructions (Signed)
Try Flonase or Rhinocort to see if that'll help with your ears symptoms

## 2016-09-04 NOTE — Progress Notes (Signed)
   Subjective:    Patient ID: [name], male    DOB: [birth year], 61 y.o.   MRN: [id number]  HPI He is here for evaluation of fullness in the ear. He has had difficulty with this in the past. It is apparently intermittent. He does take antihistamines however there is not a good history for allergies.   Review of Systems     Objective:   Physical Exam Alert and in no distress. Tympanic membranes and canals are normal. Pharyngeal area is normal. Neck is supple without adenopathy or thyromegaly. Cardiac exam shows a regular sinus rhythm without murmurs or gallops. Lungs are clear to auscultation.        Assessment & Plan:  Sensation of fullness in right ear I explained that the eardrums and ear canals were totally normal. Did recommend trying Flonase or Rhinocort to see if this would help. Explained that this could potentially be eustachian tube dysfunction.

## 2016-09-24 ENCOUNTER — Other Ambulatory Visit: Payer: Self-pay | Admitting: Family Medicine

## 2016-09-25 NOTE — Telephone Encounter (Signed)
Is this okay to refill? 

## 2016-10-20 ENCOUNTER — Other Ambulatory Visit: Payer: Self-pay | Admitting: Family Medicine

## 2016-10-23 ENCOUNTER — Ambulatory Visit: Payer: 59 | Admitting: Podiatry

## 2016-10-29 DIAGNOSIS — J3081 Allergic rhinitis due to animal (cat) (dog) hair and dander: Secondary | ICD-10-CM | POA: Diagnosis not present

## 2016-10-29 DIAGNOSIS — H04129 Dry eye syndrome of unspecified lacrimal gland: Secondary | ICD-10-CM | POA: Diagnosis not present

## 2016-10-29 DIAGNOSIS — J3089 Other allergic rhinitis: Secondary | ICD-10-CM | POA: Diagnosis not present

## 2016-11-01 DIAGNOSIS — Z9884 Bariatric surgery status: Secondary | ICD-10-CM | POA: Diagnosis not present

## 2016-11-01 DIAGNOSIS — E1165 Type 2 diabetes mellitus with hyperglycemia: Secondary | ICD-10-CM | POA: Diagnosis not present

## 2016-11-01 DIAGNOSIS — Z794 Long term (current) use of insulin: Secondary | ICD-10-CM | POA: Diagnosis not present

## 2016-11-06 DIAGNOSIS — E782 Mixed hyperlipidemia: Secondary | ICD-10-CM | POA: Diagnosis not present

## 2016-11-06 DIAGNOSIS — Z9884 Bariatric surgery status: Secondary | ICD-10-CM | POA: Diagnosis not present

## 2016-11-06 DIAGNOSIS — E1165 Type 2 diabetes mellitus with hyperglycemia: Secondary | ICD-10-CM | POA: Diagnosis not present

## 2016-11-13 ENCOUNTER — Ambulatory Visit: Payer: 59 | Admitting: Podiatry

## 2016-12-04 ENCOUNTER — Ambulatory Visit: Payer: 59 | Admitting: Podiatry

## 2016-12-18 ENCOUNTER — Ambulatory Visit: Payer: 59 | Admitting: Podiatry

## 2016-12-25 ENCOUNTER — Other Ambulatory Visit: Payer: Self-pay | Admitting: Infectious Disease

## 2017-01-03 ENCOUNTER — Ambulatory Visit: Payer: 59 | Admitting: Podiatry

## 2017-01-22 ENCOUNTER — Other Ambulatory Visit: Payer: Self-pay | Admitting: Family Medicine

## 2017-01-23 NOTE — Telephone Encounter (Signed)
Is this okay to refill? 

## 2017-01-24 ENCOUNTER — Ambulatory Visit (INDEPENDENT_AMBULATORY_CARE_PROVIDER_SITE_OTHER): Payer: 59 | Admitting: Podiatry

## 2017-01-24 ENCOUNTER — Encounter: Payer: Self-pay | Admitting: Podiatry

## 2017-01-24 DIAGNOSIS — B351 Tinea unguium: Secondary | ICD-10-CM | POA: Diagnosis not present

## 2017-01-24 DIAGNOSIS — M79676 Pain in unspecified toe(s): Secondary | ICD-10-CM

## 2017-01-24 DIAGNOSIS — E118 Type 2 diabetes mellitus with unspecified complications: Secondary | ICD-10-CM

## 2017-01-25 NOTE — Progress Notes (Signed)
He presents today for diabetic foot exam and for us to trim his nails.  Objective: Vital signs are stable he is alert and oriented 3. Pulses are palpable. Neurologic sensorium is intact. Deep tendon reflexes are intact and muscle strength is normal bilateral. Orthopedic evaluation is normal bilateral pes planus. His toenails are thick and yellow and dystrophic.  Assessment: Diabetes without complications. Pain in limb secondary to dystrophic nails.  Plan: Debridement of toenails bilateral. Follow up with him in 6 months

## 2017-02-15 ENCOUNTER — Ambulatory Visit (INDEPENDENT_AMBULATORY_CARE_PROVIDER_SITE_OTHER): Payer: 59 | Admitting: Medical

## 2017-02-15 ENCOUNTER — Encounter: Payer: Self-pay | Admitting: Medical

## 2017-02-15 VITALS — BP 116/74 | HR 75 | Wt 275.4 lb

## 2017-02-15 DIAGNOSIS — J301 Allergic rhinitis due to pollen: Secondary | ICD-10-CM

## 2017-02-15 DIAGNOSIS — B370 Candidal stomatitis: Secondary | ICD-10-CM

## 2017-02-15 DIAGNOSIS — E119 Type 2 diabetes mellitus without complications: Secondary | ICD-10-CM | POA: Diagnosis not present

## 2017-02-15 DIAGNOSIS — Z794 Long term (current) use of insulin: Secondary | ICD-10-CM | POA: Diagnosis not present

## 2017-02-15 MED ORDER — CLOTRIMAZOLE 10 MG MT TROC: 10.0000 mg | Freq: Every day | OROMUCOSAL | 0 refills | Status: DC

## 2017-02-15 NOTE — Progress Notes (Signed)
Subjective: Chief Complaint  Patient presents with  . yeast in month    yeast in month , x1 year    Here for yeast infection in mouth.  Has had this ongoing for a year.   Has seen several people about this, Dr. Redmond School here, endocrinology, and saw infectious disease, dentist, oral surgeon.  Oral surgeon reportedly did biopsy of tongue and verified candida.  He notes last visit with ID was told to use antifungal tablet on a scheduled basis and f/u in a year.  He didn't like that advice.  Here for another opinion.  Doesn't have HIV, but is diabetic.    Diabetes is controlled.  Sees Dr. Elyse Hsu.  No other aggravating or relieving factors. No other complaint.  Past Medical History:  Diagnosis Date  . Anxiety   . Arthritis    back, shoulders, left great toe  . Candidiasis, mouth 03/22/2016  . Carpal tunnel syndrome of right wrist 10/2014  . Dental crowns present   . Depression   . Dyslipidemia   . Ear fullness 08/30/2016  . GERD (gastroesophageal reflux disease)   . Hypertension    under control with med., per pt.; has been on med. x 15-20 yr.  . Insulin dependent diabetes mellitus ([place])   . Nummular eczema   . Obesity   . Onychomycosis 08/30/2016  . Recurrent infections 03/22/2016  . Sleep apnea    uses CPAP nightly   Current Outpatient Prescriptions on File Prior to Visit  Medication Sig Dispense Refill  . aspirin EC 81 MG tablet Take 81 mg by mouth at bedtime.    Marland Kitchen azelastine (ASTELIN) 0.1 % nasal spray USE 1 SPRAY IN EACH NOSTRIL AT BEDTIME 30 mL 5  . B Complex-C (B-COMPLEX WITH VITAMIN C) tablet Take 1 tablet by mouth 2 (two) times daily.    . Blood Glucose Monitoring Suppl (ONETOUCH VERIO IQ SYSTEM) W/DEVICE KIT 1 each by Does not apply route daily. 1 kit 0  . buPROPion (WELLBUTRIN SR) 200 MG 12 hr tablet TAKE 1 TABLET BY MOUTH TWO  TIMES DAILY 180 tablet 0  . calcium-vitamin D 250-100 MG-UNIT per tablet Take 1 tablet by mouth 2 (two) times daily.      . cetirizine (ZYRTEC) 10 MG  tablet Take 10 mg by mouth daily.      Marland Kitchen desvenlafaxine (PRISTIQ) 50 MG 24 hr tablet TAKE 1 TABLET BY MOUTH  DAILY. 90 tablet 1  . Desvenlafaxine ER 100 MG TB24 Take 1 tablet (100 mg total) by mouth daily. 90 tablet 3  . empagliflozin (JARDIANCE) 25 MG TABS tablet Take 25 mg by mouth daily.    . fluconazole (DIFLUCAN) 100 MG tablet Take one pill once weekly until oral lesions flare then one  Pill daily for 2 weeks 20 tablet 11  . lisinopril (PRINIVIL,ZESTRIL) 10 MG tablet Take 10 mg by mouth daily.      . metFORMIN (GLUCOPHAGE) 1000 MG tablet Take 1,000 mg by mouth 2 (two) times daily with a meal.      . Multiple Vitamins-Minerals (MULTIVITAMIN WITH MINERALS) tablet Take 1 tablet by mouth 2 (two) times daily.      Marland Kitchen omeprazole (PRILOSEC) 40 MG capsule TAKE 1 CAPSULE BY MOUTH  DAILY 90 capsule 3  . ONETOUCH DELICA LANCETS 09N MISC     . ONETOUCH VERIO test strip     . rosuvastatin (CRESTOR) 5 MG tablet TAKE 1 TABLET BY MOUTH  DAILY 90 tablet 3   No current facility-administered medications  on file prior to visit.    ROS as in subjective   Objective: BP 116/74   Pulse 75   Wt 275 lb 6.4 oz (124.9 kg)   SpO2 98%   BMI 37.35 kg/m   General appearance: alert, no distress, WD/WN,  HEENT: normocephalic, sclerae anicteric, TMs pearly, nares patent, no discharge or erythema, pharynx normal Oral cavity: MMM, no lesions Neck: supple, no lymphadenopathy, no thyromegaly, no masses No obvious lymphadenopathy    Assessment: Encounter Diagnoses  Name Primary?  . Oral candida Yes  . Type 2 diabetes mellitus without complication, with long-term current use of insulin ([place])   . Allergic rhinitis due to pollen, unspecified seasonality      Plan: I reviewed infectious disease notes, endocrinology notes, recent labs.    Advised he stop Flonase for now, c/t Astelin nasal spray, c/t Singulair and zyrtec daily.  Begin nasal saline flush in nose nightly in the shower.    Discussed with  endocrinology again about possibly stopping Jardiance as a possible benefit.    Begin oral probiotic daily.  Brush after every meal.  Increase water intake.  Floss daily.  Avoid sugary foods.    Begin Mycelex below  If not seeing improvements, will need to go back to infectious disease.   [name] was seen today for yeast in month.  Diagnoses and all orders for this visit:  Oral candida  Type 2 diabetes mellitus without complication, with long-term current use of insulin ([facility])  Allergic rhinitis due to pollen, unspecified seasonality  Other orders -     clotrimazole (MYCELEX) 10 MG troche; Take 1 tablet (10 mg total) by mouth 5 (five) times daily.

## 2017-02-22 ENCOUNTER — Other Ambulatory Visit: Payer: Self-pay | Admitting: Family Medicine

## 2017-02-25 NOTE — Telephone Encounter (Signed)
Is this okay to refill? 

## 2017-03-06 DIAGNOSIS — E782 Mixed hyperlipidemia: Secondary | ICD-10-CM | POA: Diagnosis not present

## 2017-03-06 DIAGNOSIS — E1165 Type 2 diabetes mellitus with hyperglycemia: Secondary | ICD-10-CM | POA: Diagnosis not present

## 2017-03-06 DIAGNOSIS — Z794 Long term (current) use of insulin: Secondary | ICD-10-CM | POA: Diagnosis not present

## 2017-03-19 ENCOUNTER — Telehealth: Payer: Self-pay | Admitting: Family Medicine

## 2017-03-19 NOTE — Telephone Encounter (Addendum)
Pt would like to come in for pneumonia, shingrix & flu vaccines. He is going to check with his insurance on coverage of shingrix. Is it ok for him to get these vaccines? Call him at [phone]

## 2017-03-19 NOTE — Telephone Encounter (Signed)
Schedule him to come in for the pneumonia and flu but have him hold off until he turns 62 for the Shingrix just to be safe

## 2017-03-19 NOTE — Telephone Encounter (Signed)
LMTCB

## 2017-03-21 NOTE — Telephone Encounter (Signed)
Attempted call to pt with no answer. RLB  

## 2017-03-22 DIAGNOSIS — E119 Type 2 diabetes mellitus without complications: Secondary | ICD-10-CM | POA: Diagnosis not present

## 2017-03-22 DIAGNOSIS — H35033 Hypertensive retinopathy, bilateral: Secondary | ICD-10-CM | POA: Diagnosis not present

## 2017-03-22 DIAGNOSIS — H40013 Open angle with borderline findings, low risk, bilateral: Secondary | ICD-10-CM | POA: Diagnosis not present

## 2017-03-25 NOTE — Telephone Encounter (Signed)
Attempted call to pt- no answer. Will complete this telephone message since unable to reach pt. Will send mychart msg.

## 2017-03-29 ENCOUNTER — Telehealth: Payer: Self-pay | Admitting: Internal Medicine

## 2017-03-29 DIAGNOSIS — E782 Mixed hyperlipidemia: Secondary | ICD-10-CM | POA: Diagnosis not present

## 2017-03-29 DIAGNOSIS — Z9884 Bariatric surgery status: Secondary | ICD-10-CM | POA: Diagnosis not present

## 2017-03-29 DIAGNOSIS — E1165 Type 2 diabetes mellitus with hyperglycemia: Secondary | ICD-10-CM | POA: Diagnosis not present

## 2017-03-29 NOTE — Telephone Encounter (Signed)
Pt is coming in for a Pneumonia shot on Monday, which pneumonia shot does he need to get

## 2017-03-30 NOTE — Telephone Encounter (Signed)
23

## 2017-04-01 ENCOUNTER — Other Ambulatory Visit: Payer: 59

## 2017-04-02 ENCOUNTER — Other Ambulatory Visit (INDEPENDENT_AMBULATORY_CARE_PROVIDER_SITE_OTHER): Payer: 59

## 2017-04-02 DIAGNOSIS — Z23 Encounter for immunization: Secondary | ICD-10-CM | POA: Diagnosis not present

## 2017-04-23 DIAGNOSIS — G4733 Obstructive sleep apnea (adult) (pediatric): Secondary | ICD-10-CM | POA: Diagnosis not present

## 2017-05-01 DIAGNOSIS — H04129 Dry eye syndrome of unspecified lacrimal gland: Secondary | ICD-10-CM | POA: Diagnosis not present

## 2017-05-01 DIAGNOSIS — J3089 Other allergic rhinitis: Secondary | ICD-10-CM | POA: Diagnosis not present

## 2017-05-01 DIAGNOSIS — J3081 Allergic rhinitis due to animal (cat) (dog) hair and dander: Secondary | ICD-10-CM | POA: Diagnosis not present

## 2017-07-08 ENCOUNTER — Telehealth: Payer: Self-pay | Admitting: Family Medicine

## 2017-07-08 DIAGNOSIS — B370 Candidal stomatitis: Secondary | ICD-10-CM

## 2017-07-08 MED ORDER — FLUCONAZOLE 100 MG PO TABS: ORAL_TABLET | ORAL | 3 refills | Status: DC

## 2017-07-08 NOTE — Telephone Encounter (Signed)
Pt called and is requesting a refill on his diflucan states he is no longer going to the infections disease dr, is states that dr told him he had no idea what was causing it and told him not to come back for another year and he wasn't going back there,  so he wants you to fill it for him, states his oral thrush is back, [facility] [place] [place] [address]    Pt also is wanting to come in to have the shingrix shot, if that is ok after the 16th when he turns 62 he is going to call his insurance company to make sure they cover it,  Pt can be reached at [phone] and his work number is [phone]

## 2017-07-08 NOTE — Telephone Encounter (Signed)
He called for a refill on his Diflucan.  Review of the record indicates that weekly dosing of this has not worked to control his symptoms.  I did review the data from infectious disease concerning this.  I will place him on twice a week dosing and see if this will control his symptoms.

## 2017-07-11 ENCOUNTER — Telehealth: Payer: Self-pay | Admitting: Family Medicine

## 2017-07-11 DIAGNOSIS — B370 Candidal stomatitis: Secondary | ICD-10-CM

## 2017-07-11 MED ORDER — FLUCONAZOLE 100 MG PO TABS: ORAL_TABLET | ORAL | 3 refills | Status: DC

## 2017-07-11 NOTE — Telephone Encounter (Signed)
Corrected quantity for Diflucan

## 2017-07-20 ENCOUNTER — Other Ambulatory Visit: Payer: Self-pay | Admitting: Podiatry

## 2017-07-20 ENCOUNTER — Other Ambulatory Visit: Payer: Self-pay | Admitting: Family Medicine

## 2017-07-22 NOTE — Telephone Encounter (Signed)
Can pt have a refill on meds , he has an  appt with on 08/06/2017.

## 2017-07-25 ENCOUNTER — Telehealth: Payer: Self-pay | Admitting: Family Medicine

## 2017-07-25 NOTE — Telephone Encounter (Signed)
ok 

## 2017-07-25 NOTE — Telephone Encounter (Signed)
Pt would like to get Shingrix vaccine when he comes in for his Med Ck on 08/06/17. He has checked with his insurance and Shingrix is covered. Is this ok?

## 2017-07-30 ENCOUNTER — Ambulatory Visit: Payer: 59 | Admitting: Podiatry

## 2017-08-06 ENCOUNTER — Ambulatory Visit: Payer: 59 | Admitting: Family Medicine

## 2017-08-06 ENCOUNTER — Ambulatory Visit: Payer: 59 | Admitting: Podiatry

## 2017-08-06 ENCOUNTER — Encounter: Payer: Self-pay | Admitting: Family Medicine

## 2017-08-06 VITALS — BP 124/76 | HR 77 | Wt 268.6 lb

## 2017-08-06 DIAGNOSIS — B370 Candidal stomatitis: Secondary | ICD-10-CM

## 2017-08-06 DIAGNOSIS — B3742 Candidal balanitis: Secondary | ICD-10-CM

## 2017-08-06 DIAGNOSIS — Z23 Encounter for immunization: Secondary | ICD-10-CM

## 2017-08-06 MED ORDER — FLUCONAZOLE 100 MG PO TABS: ORAL_TABLET | ORAL | 3 refills | Status: DC

## 2017-08-06 NOTE — Patient Instructions (Addendum)
Take the Diflucan daily for a week and then every other day Try Lamisil AF on the skin on the penis.

## 2017-08-06 NOTE — Progress Notes (Signed)
   Subjective:    Patient ID: [name], male    DOB: 1955/08/31, 62 y.o.   MRN: [id number]  HPI He is here for follow-up visit concerning candidiasis.  He had been on daily use of Diflucan for several months and then started taking it twice per week and noted the reoccurrence of his oral candidiasis.  He is also noted some penile itching and is wondering if it is related to that.   Review of Systems     Objective:   Physical Exam Alert and in no distress.  Whitish lesions are noted on the coast especially near the lips.  KOH was positive.  Exam of the penis does show whitish macerated area near the glans penis.       Assessment & Plan:  Candidiasis of penis - Plan: fluconazole (DIFLUCAN) 100 MG tablet  Candidiasis, mouth - Plan: fluconazole (DIFLUCAN) 100 MG tablet  Need for shingles vaccine - Plan: Varicella-zoster vaccine IM (Shingrix)  Need for Tdap vaccination - Plan: Tdap vaccine greater than or equal to 7yo IM  I will have been using Diflucan for 1 week and then go to every other day and see if that will hold him.  He will also use Lamisil on the penis to see if it will help with that.  His immunizations were also updated.

## 2017-08-13 ENCOUNTER — Encounter: Payer: Self-pay | Admitting: Family Medicine

## 2017-08-13 ENCOUNTER — Ambulatory Visit: Payer: 59 | Admitting: Family Medicine

## 2017-08-13 VITALS — BP 136/74 | HR 74 | Wt 265.4 lb

## 2017-08-13 DIAGNOSIS — G5623 Lesion of ulnar nerve, bilateral upper limbs: Secondary | ICD-10-CM

## 2017-08-13 DIAGNOSIS — L300 Nummular dermatitis: Secondary | ICD-10-CM | POA: Diagnosis not present

## 2017-08-13 NOTE — Progress Notes (Signed)
   Subjective:    Patient ID: [name], male    DOB: 07/04/1955, 62 y.o.   MRN: [id number]  HPI He complains of a 2-week history of bilateral numbness and tingling in the fourth and fifth fingers.  He usually notes this when he is reading and he has a previous history of CTS with surgery.  He also is presently being treated for nummular eczema with [place]risa.   Review of Systems     Objective:   Physical Exam Alert and in no distress.  Percussion over the ulnar notch did reproduce his symptoms bilaterally.  Exam of both arms does show some pigmented round slightly dry scaly lesions.       Assessment & Plan:  Ulnar neuropathy of both upper extremities  Nummular eczema He will continue on the [place]ucrisa.  He obtained this from his allergist and I recommended calling back when he needs to have that renewed. Also discussed the neuropathy with him.  Explained the need for him to not bend his elbows more than 90 degrees to help eliminate essentially pinching of the nerve in the ulnar notch.

## 2017-08-15 ENCOUNTER — Other Ambulatory Visit: Payer: Self-pay | Admitting: Family Medicine

## 2017-08-22 ENCOUNTER — Ambulatory Visit: Payer: 59 | Admitting: Podiatry

## 2017-08-22 ENCOUNTER — Encounter: Payer: Self-pay | Admitting: Podiatry

## 2017-08-22 DIAGNOSIS — B351 Tinea unguium: Secondary | ICD-10-CM | POA: Diagnosis not present

## 2017-08-22 DIAGNOSIS — E118 Type 2 diabetes mellitus with unspecified complications: Secondary | ICD-10-CM | POA: Diagnosis not present

## 2017-08-22 DIAGNOSIS — M79676 Pain in unspecified toe(s): Secondary | ICD-10-CM | POA: Diagnosis not present

## 2017-08-22 IMAGING — CR DG SHOULDER 2+V*R*
3 series · 3 of 3 positions shown · non-contrast
Comparison: None.

CLINICAL DATA: Recent dx of osteo arthritis patient unable to sleep
over last couple of weeks due to shoulder and humeral head aching no
known injury,

EXAM:
RIGHT SHOULDER - 2+ VIEW

[x shoulder axillary right]
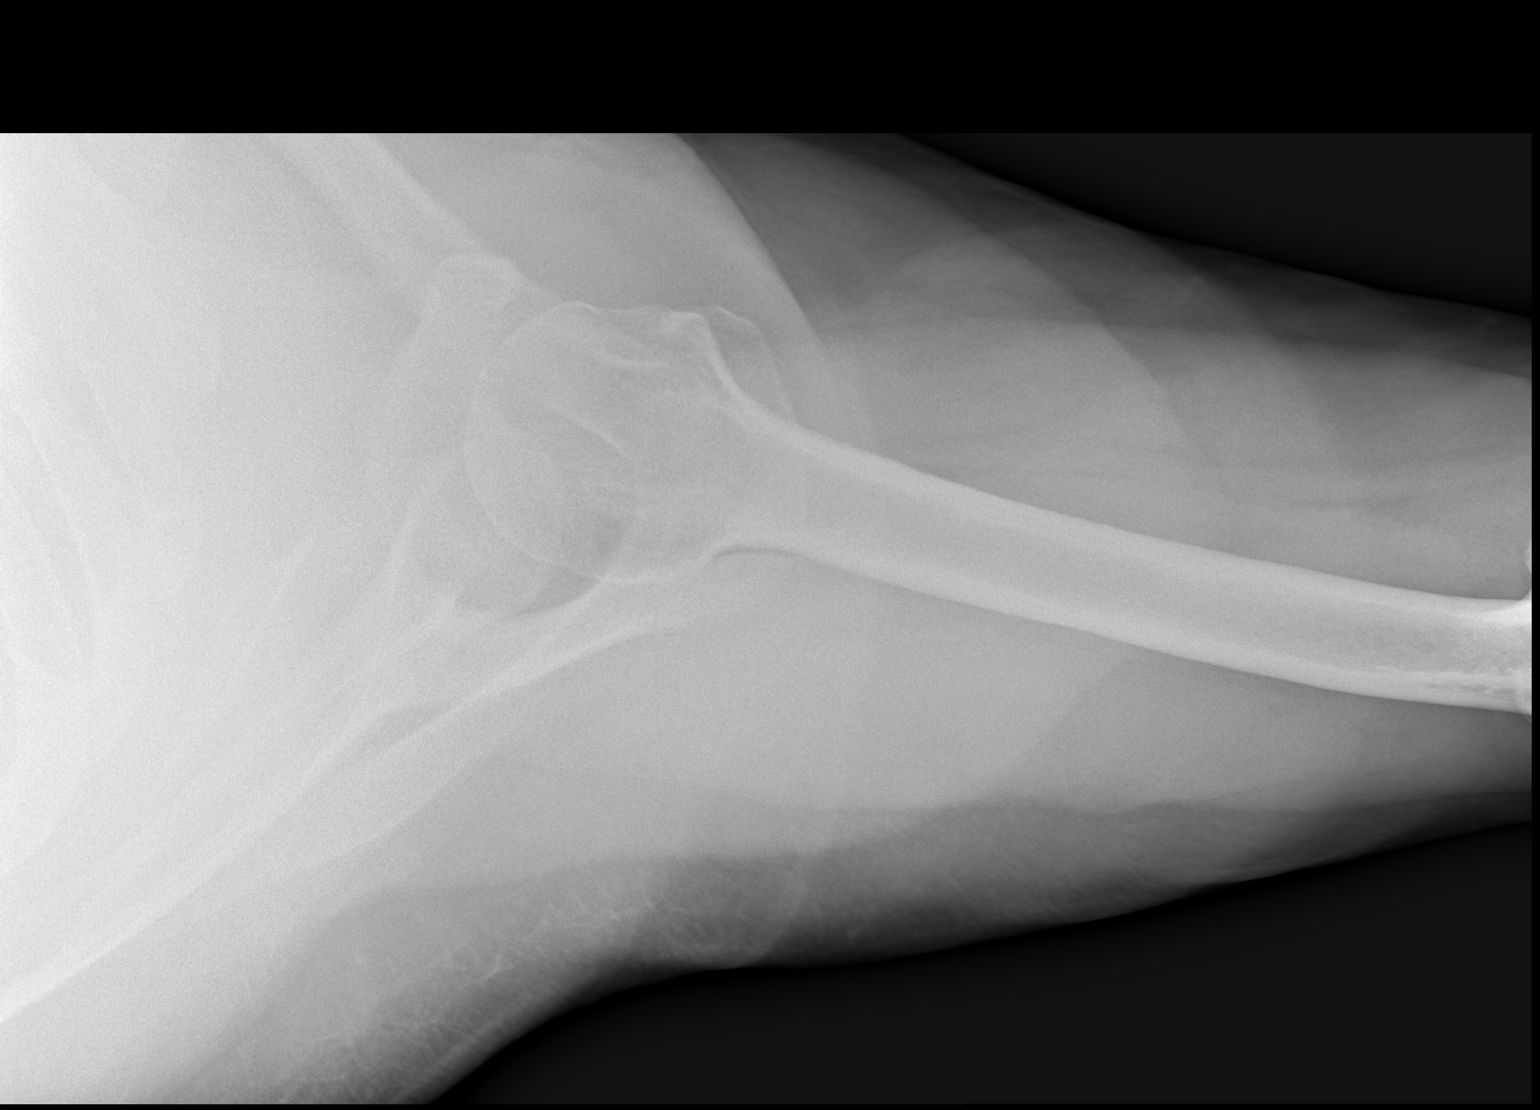

[w shoulder grashey right]
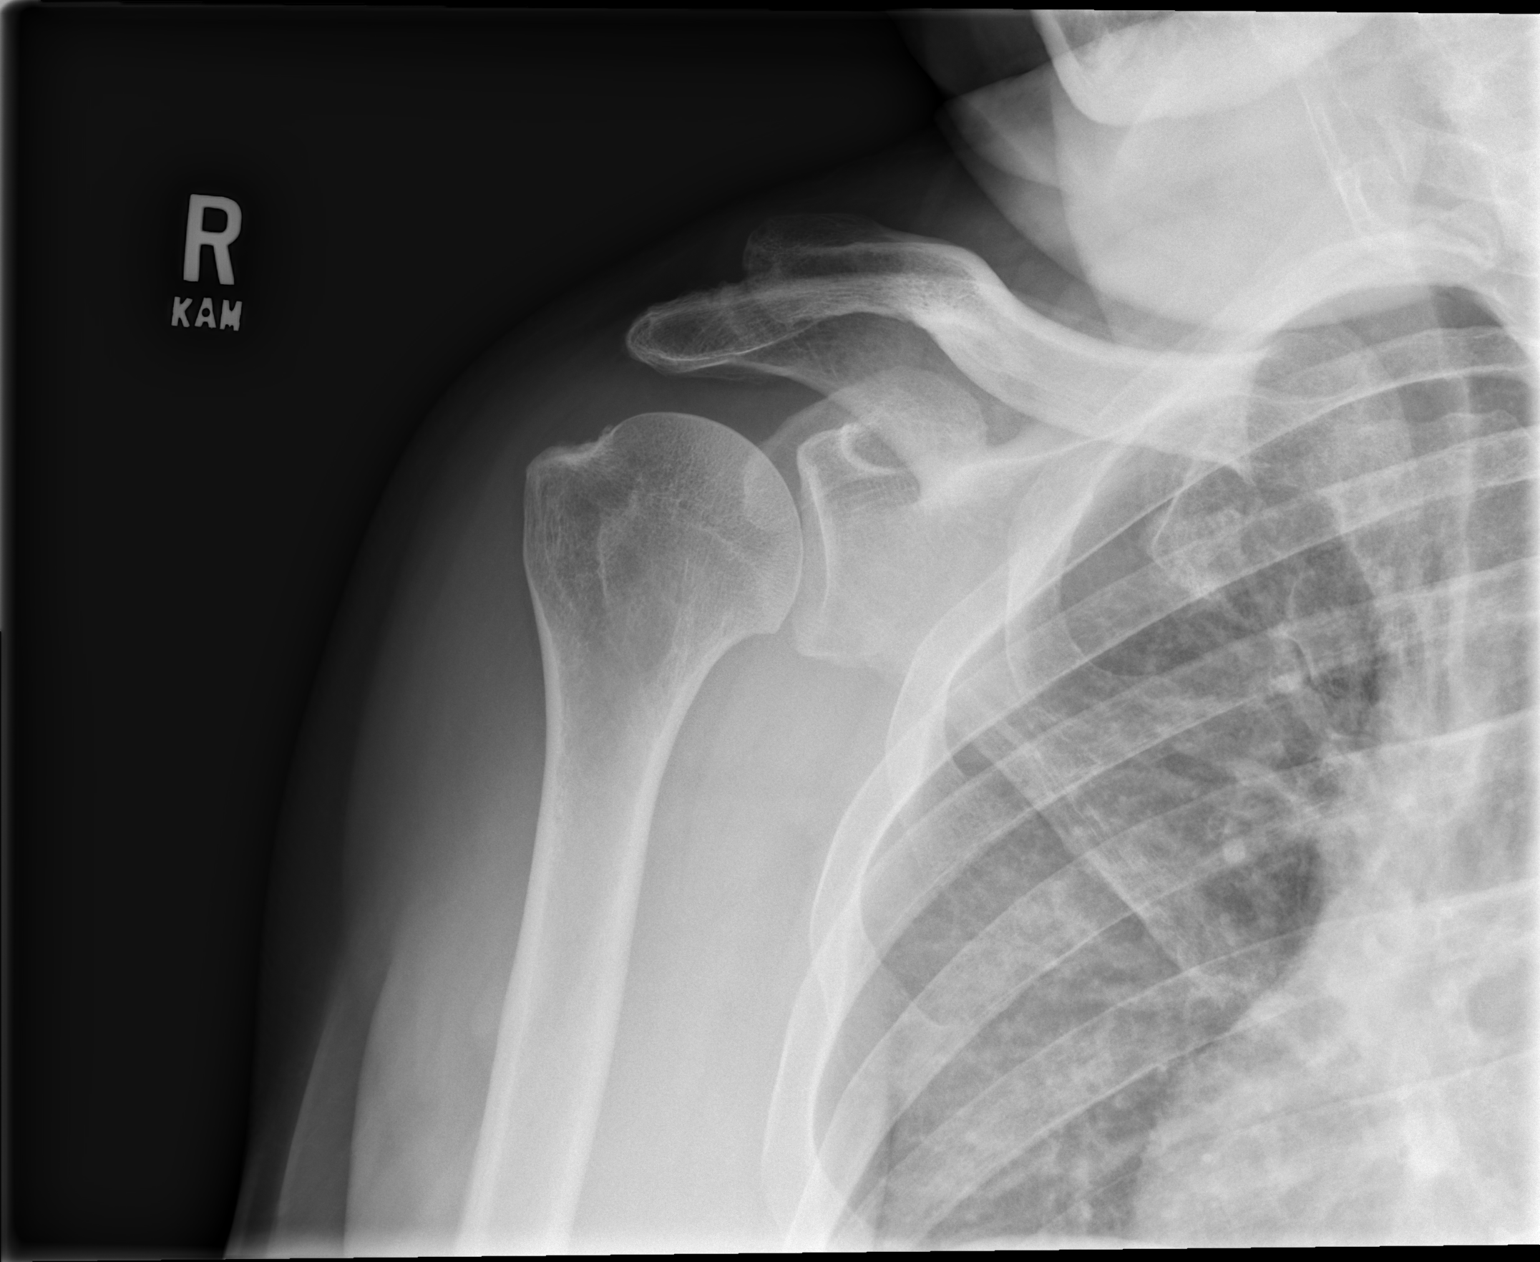

[w shoulder y-view right]
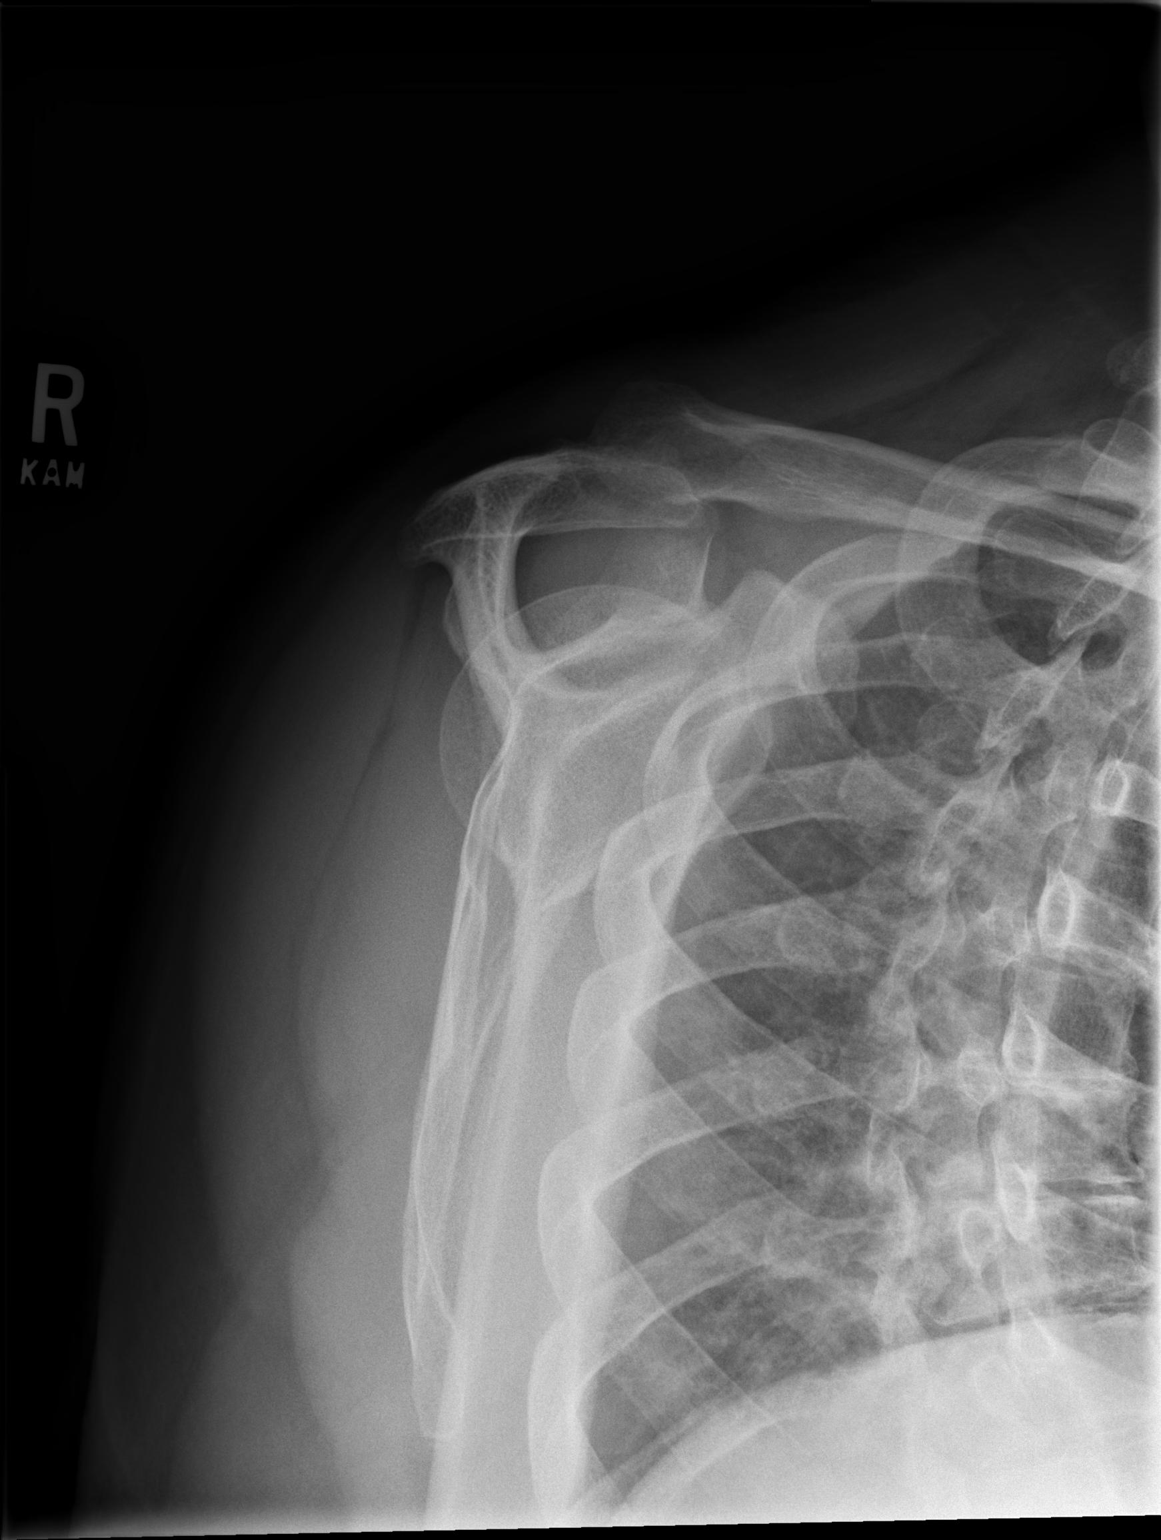

[3 of 3 positions shown; findings below may reference images not displayed]

FINDINGS: There is no evidence of fracture or dislocation. There is no
evidence of arthropathy or other focal bone abnormality. Soft
tissues are unremarkable.
IMPRESSION: Negative.

## 2017-08-25 NOTE — Progress Notes (Signed)
He presents today chief complaint of painful elongated toenails.  Objective: Vital signs are stable he is alert and oriented x3.  Toenails are long thick yellow dystrophic-like mycotic no open lesions or wounds.  Neurologic sensorium is intact degenerative flexors are intact.  Assessment: In limb secondary to onychomycosis.  Plan: Debridement of toenails 1 through 5 bilateral.

## 2017-08-27 ENCOUNTER — Telehealth: Payer: Self-pay | Admitting: Family Medicine

## 2017-08-27 ENCOUNTER — Other Ambulatory Visit: Payer: Self-pay | Admitting: Family Medicine

## 2017-08-27 DIAGNOSIS — E1165 Type 2 diabetes mellitus with hyperglycemia: Secondary | ICD-10-CM | POA: Diagnosis not present

## 2017-08-27 DIAGNOSIS — Z794 Long term (current) use of insulin: Secondary | ICD-10-CM | POA: Diagnosis not present

## 2017-08-27 DIAGNOSIS — B370 Candidal stomatitis: Secondary | ICD-10-CM

## 2017-08-27 DIAGNOSIS — B3742 Candidal balanitis: Secondary | ICD-10-CM

## 2017-08-27 DIAGNOSIS — E782 Mixed hyperlipidemia: Secondary | ICD-10-CM | POA: Diagnosis not present

## 2017-08-27 MED ORDER — FLUCONAZOLE 100 MG PO TABS: ORAL_TABLET | ORAL | 3 refills | Status: DC

## 2017-08-27 NOTE — Telephone Encounter (Signed)
Please advise I this is ok to fill. Thanks [employer]

## 2017-08-27 NOTE — Telephone Encounter (Signed)
Let him know that I called a prescription in for him to take 1 Diflucan per day.  [facility].  Have him follow-up with me in roughly 3 months

## 2017-08-27 NOTE — Telephone Encounter (Signed)
New Message   *STAT* If patient is at the pharmacy, call can be transferred to refill team.   1. Which medications need to be refilled? (please list name of each medication and dose if known)  fluconazole (Diflucan) 100 mg tablet take one pill twice a week  2. Which pharmacy/location (including street and city if local pharmacy) is medication to be sent to? [facility]  3. Do they need a 30 day or 90 day supply?  90 day supply  Pt verbalized they did not have it but did advised pt it was sent on 2.5.19 to [facility].  Pt also verbalized he is taking medication three times a week and it is not working.  Please advise

## 2017-08-28 NOTE — Telephone Encounter (Signed)
Called pt to let him know you sent in diflucan and made his follow up appt. Thanks [employer]

## 2017-08-28 NOTE — Telephone Encounter (Signed)
Pt pharmacy is requesting that pt wellbutrin be refilled . Please advise. Thanks [employer]

## 2017-09-04 ENCOUNTER — Other Ambulatory Visit: Payer: Self-pay

## 2017-09-04 MED ORDER — FLUTICASONE PROPIONATE 50 MCG/ACT NA SUSP: NASAL | 3 refills | Status: DC

## 2017-09-05 ENCOUNTER — Telehealth: Payer: Self-pay

## 2017-09-05 DIAGNOSIS — B3742 Candidal balanitis: Secondary | ICD-10-CM

## 2017-09-05 DIAGNOSIS — B370 Candidal stomatitis: Secondary | ICD-10-CM

## 2017-09-05 MED ORDER — FLUCONAZOLE 100 MG PO TABS: ORAL_TABLET | ORAL | 1 refills | Status: DC

## 2017-09-05 NOTE — Addendum Note (Signed)
Addended by: Ronnald NianLALONDE, Ancelmo Hunt C on: 09/05/2017 02:41 PM   Modules accepted: Orders

## 2017-09-05 NOTE — Telephone Encounter (Signed)
Done KH 

## 2017-09-05 NOTE — Telephone Encounter (Signed)
Spoke to pt about med. I let him know that I talked to the rep and she said that script was not due . Pt has advised me that Dr.Lanlonde has increased his dose to one tab QD Please send in new script for Diflucan to local pharmacy because [facility] is out[facility] [address]. Pt also wants a 90 day script [facility] for [facility] future. Thanks [employer]

## 2017-09-05 NOTE — Telephone Encounter (Signed)
I [facility] in [facility]

## 2017-09-06 ENCOUNTER — Other Ambulatory Visit: Payer: Self-pay

## 2017-09-06 DIAGNOSIS — B3742 Candidal balanitis: Secondary | ICD-10-CM

## 2017-09-06 DIAGNOSIS — B370 Candidal stomatitis: Secondary | ICD-10-CM

## 2017-09-06 MED ORDER — FLUCONAZOLE 100 MG PO TABS: ORAL_TABLET | ORAL | 0 refills | Status: DC

## 2017-10-03 ENCOUNTER — Other Ambulatory Visit: Payer: Self-pay

## 2017-10-03 DIAGNOSIS — B3742 Candidal balanitis: Secondary | ICD-10-CM

## 2017-10-03 DIAGNOSIS — B370 Candidal stomatitis: Secondary | ICD-10-CM

## 2017-10-03 MED ORDER — ASPIRIN EC 81 MG PO TBEC: 81.0000 mg | DELAYED_RELEASE_TABLET | Freq: Every day | ORAL | 0 refills | Status: DC

## 2017-10-03 MED ORDER — FLUCONAZOLE 100 MG PO TABS: ORAL_TABLET | ORAL | 0 refills | Status: DC

## 2017-10-04 ENCOUNTER — Other Ambulatory Visit: Payer: Self-pay | Admitting: Family Medicine

## 2017-10-04 ENCOUNTER — Telehealth: Payer: Self-pay

## 2017-10-04 DIAGNOSIS — B370 Candidal stomatitis: Secondary | ICD-10-CM

## 2017-10-04 DIAGNOSIS — B3742 Candidal balanitis: Secondary | ICD-10-CM

## 2017-10-04 MED ORDER — FLUCONAZOLE 100 MG PO TABS: ORAL_TABLET | ORAL | 0 refills | Status: DC

## 2017-10-04 NOTE — Telephone Encounter (Signed)
Pt called back to see if he could get a couple day supply of fluconazole 100 mg sent into [facility] [address] due to being out and [facility] not sending out enough med. Script was recently [facility] to [facility] for 90 day supply but has not been shipped to pt as of today. Pt has an appt with you on Monday . [facility]

## 2017-10-07 ENCOUNTER — Ambulatory Visit: Payer: 59 | Admitting: Family Medicine

## 2017-10-08 ENCOUNTER — Ambulatory Visit: Payer: 59 | Admitting: Family Medicine

## 2017-10-08 VITALS — BP 114/62 | HR 81 | Temp 97.8°F | Ht 71.0 in | Wt 269.8 lb

## 2017-10-08 DIAGNOSIS — B370 Candidal stomatitis: Secondary | ICD-10-CM

## 2017-10-08 NOTE — Progress Notes (Signed)
   Subjective:    Patient ID: [name], male    DOB: 19-Feb-1956, 62 y.o.   MRN: [id number]  HPI He is here for a follow-up visit for treatment of oral candidiasis.  He has been seen in the past by ID and given Diflucan.  He was originally taking it twice per week but for the last several months has been taking this daily.  He still does complain of a lump-like feeling on the mucosa in the mandibular area and also notes that brushing his teeth does cause some pain in the tongue.  He is apparently seen his dentist on 2 occasions for this and given no particular diagnosis.   Review of Systems     Objective:   Physical Exam Alert and in no distress.  Exam of the oral mucosa shows no whitish lesions but there is some slight whitish discoloration on the mucosa on the mandibular side however the KOH was negative.       Assessment & Plan:  Candidiasis, mouth No evidence of candidiasis today.  I explained this to him and stated that I could no longer continue to give him Diflucan on a daily basis as it does not seem to be needed nor is her good evidence that this would be a good long-term benefit.  Recommend he potentially look into dietary modification to see if that would help with his present situation.

## 2017-10-28 DIAGNOSIS — G4733 Obstructive sleep apnea (adult) (pediatric): Secondary | ICD-10-CM | POA: Diagnosis not present

## 2017-11-07 ENCOUNTER — Ambulatory Visit: Payer: Self-pay | Admitting: Family Medicine

## 2017-11-12 DIAGNOSIS — J3089 Other allergic rhinitis: Secondary | ICD-10-CM | POA: Diagnosis not present

## 2017-11-12 DIAGNOSIS — H04129 Dry eye syndrome of unspecified lacrimal gland: Secondary | ICD-10-CM | POA: Diagnosis not present

## 2017-11-12 DIAGNOSIS — J3081 Allergic rhinitis due to animal (cat) (dog) hair and dander: Secondary | ICD-10-CM | POA: Diagnosis not present

## 2017-11-13 DIAGNOSIS — G4733 Obstructive sleep apnea (adult) (pediatric): Secondary | ICD-10-CM | POA: Diagnosis not present

## 2017-11-29 DIAGNOSIS — Z9884 Bariatric surgery status: Secondary | ICD-10-CM | POA: Diagnosis not present

## 2017-11-29 DIAGNOSIS — I10 Essential (primary) hypertension: Secondary | ICD-10-CM | POA: Diagnosis not present

## 2017-11-29 DIAGNOSIS — E782 Mixed hyperlipidemia: Secondary | ICD-10-CM | POA: Diagnosis not present

## 2017-12-02 DIAGNOSIS — Z9884 Bariatric surgery status: Secondary | ICD-10-CM | POA: Diagnosis not present

## 2017-12-02 DIAGNOSIS — E782 Mixed hyperlipidemia: Secondary | ICD-10-CM | POA: Diagnosis not present

## 2017-12-02 DIAGNOSIS — E119 Type 2 diabetes mellitus without complications: Secondary | ICD-10-CM | POA: Diagnosis not present

## 2017-12-09 ENCOUNTER — Ambulatory Visit: Payer: 59 | Admitting: Family Medicine

## 2017-12-09 ENCOUNTER — Encounter: Payer: Self-pay | Admitting: Family Medicine

## 2017-12-09 VITALS — BP 100/62 | HR 79 | Temp 98.0°F | Wt 273.8 lb

## 2017-12-09 DIAGNOSIS — B370 Candidal stomatitis: Secondary | ICD-10-CM

## 2017-12-09 MED ORDER — NYSTATIN 100000 UNIT/ML MT SUSP
5.0000 mL | Freq: Every day | OROMUCOSAL | 0 refills | Status: DC
Start: 2017-12-09 — End: 2018-05-08

## 2017-12-09 NOTE — Patient Instructions (Addendum)
Pressure teeth after every meal and rinse her mouth out after you brush her teeth or eat.  Use the oral nystatin at bedtime. Continue the Diflucan

## 2017-12-09 NOTE — Progress Notes (Signed)
   Subjective:    Patient ID: Spero Geraldsnthony L Redler, male    DOB: Jan 19, 1956, 62 y.o.   MRN: [id number]  HPI He is here for evaluation of continued difficulty with oral thrush.  He has been seen in the past by infectious disease and was placed on Diflucan.  He was originally taking that once a week but now he is taking it every other day to keep it under control  He was told to brush his teeth and wash his mouth out periodically although he has not done that.   Review of Systems     Objective:   Physical Exam Alert and in no distress exam of the mouth does show some whitish lesions but also food particles.  KOH showed only rare hyphae.       Assessment & Plan:  Candidiasis, mouth - Plan: nystatin (MYCOSTATIN) 100000 UNIT/ML suspension He is to continue on the Diflucan every other day and use the nystatin once a week to see if this will help control it.  He is also to brush his teeth and irrigate his mouth out regularly.  Hopefully with better oral care will help to eliminate this.

## 2017-12-19 DIAGNOSIS — L281 Prurigo nodularis: Secondary | ICD-10-CM | POA: Diagnosis not present

## 2017-12-19 DIAGNOSIS — L249 Irritant contact dermatitis, unspecified cause: Secondary | ICD-10-CM | POA: Diagnosis not present

## 2018-01-06 ENCOUNTER — Ambulatory Visit: Payer: 59 | Admitting: Family Medicine

## 2018-01-09 ENCOUNTER — Other Ambulatory Visit: Payer: Self-pay | Admitting: Family Medicine

## 2018-01-09 DIAGNOSIS — B3742 Candidal balanitis: Secondary | ICD-10-CM

## 2018-01-09 DIAGNOSIS — B370 Candidal stomatitis: Secondary | ICD-10-CM

## 2018-01-09 NOTE — Telephone Encounter (Signed)
Can you check on these? thanks

## 2018-01-09 NOTE — Telephone Encounter (Signed)
Is this okay to refill? 

## 2018-01-28 DIAGNOSIS — M25511 Pain in right shoulder: Secondary | ICD-10-CM | POA: Diagnosis not present

## 2018-01-28 DIAGNOSIS — G894 Chronic pain syndrome: Secondary | ICD-10-CM | POA: Diagnosis not present

## 2018-01-28 DIAGNOSIS — M1991 Primary osteoarthritis, unspecified site: Secondary | ICD-10-CM | POA: Diagnosis not present

## 2018-01-28 DIAGNOSIS — M75121 Complete rotator cuff tear or rupture of right shoulder, not specified as traumatic: Secondary | ICD-10-CM | POA: Diagnosis not present

## 2018-01-28 DIAGNOSIS — Z79891 Long term (current) use of opiate analgesic: Secondary | ICD-10-CM | POA: Diagnosis not present

## 2018-01-29 ENCOUNTER — Ambulatory Visit: Payer: 59 | Admitting: Family Medicine

## 2018-02-13 ENCOUNTER — Ambulatory Visit: Payer: 59 | Admitting: Family Medicine

## 2018-02-17 ENCOUNTER — Other Ambulatory Visit: Payer: Self-pay | Admitting: Family Medicine

## 2018-02-17 NOTE — Telephone Encounter (Signed)
Is this okay to refill? 

## 2018-02-20 ENCOUNTER — Encounter: Payer: Self-pay | Admitting: Podiatry

## 2018-02-20 ENCOUNTER — Ambulatory Visit: Payer: 59 | Admitting: Podiatry

## 2018-02-20 DIAGNOSIS — E118 Type 2 diabetes mellitus with unspecified complications: Secondary | ICD-10-CM

## 2018-02-20 DIAGNOSIS — M79676 Pain in unspecified toe(s): Secondary | ICD-10-CM

## 2018-02-20 DIAGNOSIS — B351 Tinea unguium: Secondary | ICD-10-CM

## 2018-02-20 NOTE — Progress Notes (Signed)
He presents today as a type II diabetic without complications with a chief complaint of painful elongated toenails.  Objective: Vital signs are stable he is alert and oriented x3 pulses are palpable.  Neurologic sensorium is intact degenerative flexors are intact toenails are long thick yellow dystrophic-like mycotic sharp incurvated nail margins.  No open lesions or wounds are noted.  Assessment: Pain in limb secondary onychomycosis.  Plan: Debridement of toenails 1 through 5 bilateral.

## 2018-02-24 DIAGNOSIS — K1329 Other disturbances of oral epithelium, including tongue: Secondary | ICD-10-CM | POA: Diagnosis not present

## 2018-02-26 DIAGNOSIS — L309 Dermatitis, unspecified: Secondary | ICD-10-CM | POA: Diagnosis not present

## 2018-03-22 ENCOUNTER — Other Ambulatory Visit: Payer: Self-pay | Admitting: Infectious Disease

## 2018-03-22 ENCOUNTER — Other Ambulatory Visit: Payer: Self-pay | Admitting: Family Medicine

## 2018-03-24 ENCOUNTER — Telehealth: Payer: Self-pay | Admitting: Family Medicine

## 2018-03-24 ENCOUNTER — Other Ambulatory Visit: Payer: Self-pay

## 2018-03-24 MED ORDER — ROSUVASTATIN CALCIUM 5 MG PO TABS: 5.0000 mg | ORAL_TABLET | Freq: Every day | ORAL | 3 refills | Status: DC

## 2018-03-24 NOTE — Telephone Encounter (Signed)
Done pt called . KH

## 2018-03-24 NOTE — Telephone Encounter (Signed)
Pt called and requested a refill on rosuvastatin. [facility] to [facility].

## 2018-04-03 ENCOUNTER — Other Ambulatory Visit: Payer: Self-pay | Admitting: Family Medicine

## 2018-04-03 DIAGNOSIS — B3742 Candidal balanitis: Secondary | ICD-10-CM

## 2018-04-03 DIAGNOSIS — B370 Candidal stomatitis: Secondary | ICD-10-CM

## 2018-04-03 NOTE — Telephone Encounter (Signed)
[facility] is requesting to fill pt diflucan. Please advise [facility]

## 2018-04-04 ENCOUNTER — Telehealth: Payer: Self-pay | Admitting: Family Medicine

## 2018-04-04 NOTE — Telephone Encounter (Signed)
Pt came in and dropped off cpap compliance report. Sending back for review.  Also pt is not pleased with current dermatologist. He would like to get a recommendation for a new one. Pt has an appt on Tuesday.

## 2018-04-05 ENCOUNTER — Emergency Department (HOSPITAL_COMMUNITY)
Admission: EM | Admit: 2018-04-05 | Discharge: 2018-04-05 | Disposition: A | Discharge status: Home / Self Care | Payer: 59 | Attending: Emergency Medicine | Admitting: Emergency Medicine

## 2018-04-05 ENCOUNTER — Encounter (HOSPITAL_COMMUNITY): Payer: Self-pay

## 2018-04-05 ENCOUNTER — Emergency Department (HOSPITAL_COMMUNITY): Payer: 59

## 2018-04-05 ENCOUNTER — Other Ambulatory Visit: Payer: Self-pay

## 2018-04-05 DIAGNOSIS — Z79899 Other long term (current) drug therapy: Secondary | ICD-10-CM | POA: Diagnosis not present

## 2018-04-05 DIAGNOSIS — E785 Hyperlipidemia, unspecified: Secondary | ICD-10-CM | POA: Diagnosis not present

## 2018-04-05 DIAGNOSIS — Z7982 Long term (current) use of aspirin: Secondary | ICD-10-CM | POA: Insufficient documentation

## 2018-04-05 DIAGNOSIS — Z794 Long term (current) use of insulin: Secondary | ICD-10-CM | POA: Diagnosis not present

## 2018-04-05 DIAGNOSIS — E1169 Type 2 diabetes mellitus with other specified complication: Secondary | ICD-10-CM | POA: Diagnosis not present

## 2018-04-05 DIAGNOSIS — Z91040 Latex allergy status: Secondary | ICD-10-CM | POA: Diagnosis not present

## 2018-04-05 DIAGNOSIS — R42 Dizziness and giddiness: Secondary | ICD-10-CM | POA: Diagnosis not present

## 2018-04-05 DIAGNOSIS — Z87891 Personal history of nicotine dependence: Secondary | ICD-10-CM | POA: Insufficient documentation

## 2018-04-05 DIAGNOSIS — I10 Essential (primary) hypertension: Secondary | ICD-10-CM | POA: Insufficient documentation

## 2018-04-05 LAB — BASIC METABOLIC PANEL
Anion gap: 9 (ref 5–15)
BUN: 21 mg/dL — ABNORMAL HIGH (ref 6–20)
CO2: 22 mmol/L (ref 22–32)
Calcium: 9.3 mg/dL (ref 8.9–10.3)
Chloride: 111 mmol/L (ref 98–111)
Creatinine, Ser: 0.89 mg/dL (ref 0.61–1.24)
GFR calc Af Amer: >60 mL/min (ref >60)
GFR calc non Af Amer: >60 mL/min (ref >60)
Glucose, Bld: 87 mg/dL (ref 70–99)
Potassium: 4.4 mmol/L (ref 3.5–5.1)
Sodium: 142 mmol/L (ref 135–145)

## 2018-04-05 LAB — CBC
HCT: 32.8 % — ABNORMAL LOW (ref 39.0–52.0)
Hemoglobin: 9.3 g/dL — ABNORMAL LOW (ref 13.0–17.0)
MCH: 17.2 pg — ABNORMAL LOW (ref 26.0–34.0)
MCHC: 28.4 g/dL — ABNORMAL LOW (ref 30.0–36.0)
MCV: 60.6 fL — ABNORMAL LOW (ref 78.0–100.0)
Platelets: 444 10*3/uL — ABNORMAL HIGH (ref 150–400)
RBC: 5.41 MIL/uL (ref 4.22–5.81)
RDW: 20.8 % — ABNORMAL HIGH (ref 11.5–15.5)
WBC: 10.8 10*3/uL — ABNORMAL HIGH (ref 4.0–10.5)

## 2018-04-05 LAB — CBG MONITORING, ED: Glucose-Capillary: 85 mg/dL (ref 70–99)

## 2018-04-05 NOTE — Discharge Instructions (Addendum)
As discussed, today's evaluation has been generally reassuring.  There are some mild laboratory abnormalities, including slight decrease in your hemoglobin value. It is important to discuss today's findings with your physician.  But given the circumstances of the fall, need for ongoing evaluation and monitoring, please discuss the episode with your physician at your appointment in 3 days.

## 2018-04-05 NOTE — ED Notes (Signed)
Pt given ginger ale and portable phone to call to find ride home

## 2018-04-05 NOTE — ED Triage Notes (Addendum)
Pt states he has sleep apnea, insomnia "and a whole bunch of other stuff". Pt states this morning at home at 0600, he became dizzy and passed out. Pt states he he hit on his right temple, nose, and lip. Pt states he is not currently dizzy. Pt states that his right ear feels congested as well.

## 2018-04-05 NOTE — ED Provider Notes (Signed)
[facility] DEPT Provider Note   CSN: [id number] Arrival date & time: 04/05/18  1501     History   Chief Complaint Chief Complaint  Patient presents with  . Fall  . Dizziness    HPI [name] is a 62 y.o. male.  HPI Presents after episode of fall, possible syncope. Patient has multiple medical issues including insomnia, sleepwalking, and today, about 10 hours ago patient had a fall. Patient denies chest pain either before or afterwards, states he currently feels in his usual state of health. No recent medication change, diet change, activity change.  Past Medical History:  Diagnosis Date  . Anxiety   . Arthritis    back, shoulders, left great toe  . Candidiasis, mouth 03/22/2016  . Carpal tunnel syndrome of right wrist 10/2014  . Dental crowns present   . Depression   . Dyslipidemia   . Ear fullness 08/30/2016  . GERD (gastroesophageal reflux disease)   . Hypertension    under control with med., per pt.; has been on med. x 15-20 yr.  . Insulin dependent diabetes mellitus ([place])   . Nummular eczema   . Obesity   . Onychomycosis 08/30/2016  . Recurrent infections 03/22/2016  . Sleep apnea    uses CPAP nightly    Patient Active Problem List   Diagnosis Date Noted  . Nummular eczema 08/13/2017  . Onychomycosis 08/30/2016  . Recurrent infections 03/22/2016  . Candidiasis, mouth 03/22/2016  . Passive-dependent personality disorder ([place]) 08/02/2015  . Acid reflux 06/16/2015  . Hypertension associated with diabetes ([place]) 06/16/2015  . Obstructive sleep apnea 07/09/2014  . Abnormal CT scan 12/23/2012  . Diabetes ([place]) 05/11/2008  . Hyperlipidemia associated with type 2 diabetes mellitus ([place]) 05/11/2008  . GERD 05/11/2008    Past Surgical History:  Procedure Laterality Date  . CARDIAC CATHETERIZATION  12/30/2006   "mod. pulmonary HTN with preserved cardiac output and cardiac index"  . CARPAL TUNNEL RELEASE Right 10/14/2014   Procedure: RIGHT CARPAL TUNNEL RELEASE;  Surgeon: Leanora Cover, MD;  Location: [place];  Service: Orthopedics;  Laterality: Right;  . CARPAL TUNNEL RELEASE Left 05/19/2015   Procedure: LEFT CARPAL TUNNEL RELEASE;  Surgeon: Leanora Cover, MD;  Location: [place];  Service: Orthopedics;  Laterality: Left;  . CYST EXCISION     scalp and back  . ESOPHAGOGASTRODUODENOSCOPY  03/25/2007  . FOOT SURGERY Left    revision traumatic amputation of foot  . ROUX-EN-Y PROCEDURE  07/28/2007   with lysis of adhesions        Home Medications    Prior to Admission medications   Medication Sig Start Date End Date Taking? Authorizing Provider  ASPIRIN ADULT LOW STRENGTH 81 MG EC tablet TAKE 1 TABLET BY MOUTH AT  BEDTIME 03/24/18   Denita Lung, MD  azelastine (ASTELIN) 0.1 % nasal spray USE 1 SPRAY IN EACH NOSTRIL AT BEDTIME 07/22/17   Denita Lung, MD  B Complex-C (B-COMPLEX WITH VITAMIN C) tablet Take 1 tablet by mouth 2 (two) times daily.    [provider]  Blood Glucose Monitoring Suppl (ONETOUCH VERIO IQ SYSTEM) W/DEVICE KIT 1 each by Does not apply route daily. 02/05/14   Denita Lung, MD  buPROPion (WELLBUTRIN SR) 200 MG 12 hr tablet TAKE 1 TABLET BY MOUTH TWO  TIMES DAILY 02/17/18   Denita Lung, MD  calcium-vitamin D 250-100 MG-UNIT per tablet Take 1 tablet by mouth 2 (two) times daily.  [provider]  cetirizine (ZYRTEC) 10 MG tablet Take 10 mg by mouth daily.      [provider]  clotrimazole (MYCELEX) 10 MG troche Dissolve 1 tablet on tongue 3 times a day 05/11/15   [provider]  desvenlafaxine (PRISTIQ) 50 MG 24 hr tablet TAKE 1 TABLET BY MOUTH  DAILY. 01/09/18   Henson, Vickie L, NP-C  empagliflozin (JARDIANCE) 25 MG TABS tablet Take 25 mg by mouth daily.    [provider]  EUCRISA 2 % OINT APPLY TOPICALLY TO PROBLEMATIC AREAS 2 TIMES DAILY 08/04/17   [provider]  fluconazole (DIFLUCAN) 100  MG tablet Take one pill po qd 10/04/17   Denita Lung, MD  fluconazole (DIFLUCAN) 100 MG tablet TAKE 1 TABLET BY MOUTH  EVERY DAY 01/09/18   Henson, Vickie L, NP-C  fluticasone (FLONASE) 50 MCG/ACT nasal spray 50Use as directed once a day for allergies 09/04/17   Denita Lung, MD  ibuprofen (ADVIL,MOTRIN) 200 MG tablet Take 2 tablets 1-2 times a day as needed    [provider]  Insulin Glargine (BASAGLAR KWIKPEN) 100 UNIT/ML SOPN  01/16/17   [provider]  Insulin Pen Needle (FIFTY50 PEN NEEDLES) 32G X 4 MM MISC Use to inject insulin once a day or as directed 07/23/17   [provider]  Insulin Syringe-Needle U-100 (INSULIN SYRINGE 1CC/31GX5/16") 31G X 5/16" 1 ML MISC Use as directed once daily ( Dx code E 11.65) 08/30/16   [provider]  lisinopril (PRINIVIL,ZESTRIL) 10 MG tablet Take 10 mg by mouth daily.      [provider]  metFORMIN (GLUCOPHAGE) 1000 MG tablet Take 1,000 mg by mouth 2 (two) times daily with a meal.      [provider]  montelukast (SINGULAIR) 10 MG tablet  08/15/17   [provider]  Multiple Vitamin (MULTI-VITAMINS) TABS Take 1 tablet twice a day    [provider]  Multiple Vitamins-Minerals (MULTIVITAMIN WITH MINERALS) tablet Take 1 tablet by mouth 2 (two) times daily.      [provider]  nystatin (MYCOSTATIN) 100000 UNIT/ML suspension Take 5 mLs (500,000 Units total) by mouth daily. 12/09/17   Denita Lung, MD  omeprazole (PRILOSEC) 40 MG capsule TAKE 1 CAPSULE BY MOUTH  DAILY 08/29/17   Denita Lung, MD  [facility] LANCETS 78M [place]  08/15/15   [provider]  [facility] VERIO test strip  08/15/15   [provider]  rosuvastatin (CRESTOR) 5 MG tablet Take 1 tablet (5 mg total) by mouth daily. 03/24/18   Denita Lung, MD  triamcinolone ointment (KENALOG) 0.1 %  05/27/17   [provider]    Family History Family History  Problem Relation Age of Onset    . Heart disease Mother   . Cancer Mother 40       Multiple myeloma  . Diabetes Father   . Diabetes Brother   . Heart disease Brother 48       cardiomyopathy  . Heart disease Brother 70       MI  . Arthritis Paternal Grandmother   . Diabetes Paternal Grandmother   . Heart disease Paternal Grandmother   . Stroke Paternal Grandmother     Social History Social History   Tobacco Use  . Smoking status: Former Smoker    Last attempt to quit: 07/03/2007    Years since quitting: 10.7  . Smokeless tobacco: Never Used  Substance Use Topics  .  Alcohol use: Yes    Comment: seldom  . Drug use: No     Allergies   Latex   Review of Systems Review of Systems  Constitutional:       Per HPI, otherwise negative  HENT:       Per HPI, otherwise negative  Respiratory:       Per HPI, otherwise negative  Cardiovascular:       Per HPI, otherwise negative  Gastrointestinal: Negative for vomiting.  Endocrine:       Negative aside from HPI  Genitourinary:       Neg aside from HPI   Musculoskeletal:       Per HPI, otherwise negative  Skin: Negative.   Neurological:       Possible syncope     Physical Exam Updated Vital Signs BP (!) 113/50   Pulse 76   Temp 99 F (37.2 C) (Oral)   Resp (!) 22   Ht 6' (1.829 m)   Wt 123.4 kg   SpO2 100%   BMI 36.89 kg/m   Physical Exam  Constitutional: He is oriented to person, place, and time. He appears well-developed. No distress.  HENT:  Head: Normocephalic and atraumatic.  Eyes: Conjunctivae and EOM are normal.  Cardiovascular: Normal rate and regular rhythm.  Pulmonary/Chest: Effort normal. No stridor. No respiratory distress.  Abdominal: He exhibits no distension.  Musculoskeletal: He exhibits no edema.  Neurological: He is alert and oriented to person, place, and time.  Skin: Skin is warm and dry.  Psychiatric: He has a normal mood and affect.  Nursing note and vitals reviewed.    ED Treatments / Results  Labs (all labs  ordered are listed, but only abnormal results are displayed) Labs Reviewed  BASIC METABOLIC PANEL - Abnormal; Notable for the following components:      Result Value   BUN 21 (*)    All other components within normal limits  CBC - Abnormal; Notable for the following components:   WBC 10.8 (*)    Hemoglobin 9.3 (*)    HCT 32.8 (*)    MCV 60.6 (*)    MCH 17.2 (*)    MCHC 28.4 (*)    RDW 20.8 (*)    Platelets 444 (*)    All other components within normal limits  URINALYSIS, ROUTINE W REFLEX MICROSCOPIC  CBG MONITORING, ED    EKG EKG Interpretation  Date/Time:  Saturday April 05 2018 15:38:48 EDT Ventricular Rate:  72 PR Interval:    QRS Duration: 74 QT Interval:  369 QTC Calculation: 404 R Axis:   21 Text Interpretation:  Sinus rhythm Low voltage, precordial leads Borderline T abnormalities, inferior leads Artifact No significant change since last tracing Abnormal ekg Confirmed by Carmin Muskrat [phone]) on 04/05/2018 3:57:58 PM   Radiology Dg Chest 2 View  Result Date: 04/05/2018 CLINICAL DATA:  Pt states he has sleep apnea, insomnia "and a whole bunch of other stuff". Pt states this morning at home at 0600, he became dizzy and passed out. Pt states he he hit on his right temple, nose, and lip. Pt states he is not currently dizzy. Pt states that his right ear feels congested as well.; HTN; ex smoker EXAM: CHEST - 2 VIEW COMPARISON:  07/29/2007 FINDINGS: The heart size and mediastinal contours are within normal limits. Both lungs are clear. No pleural effusion or pneumothorax. The visualized skeletal structures are unremarkable. IMPRESSION: No active cardiopulmonary disease. Electronically Signed   By: Dedra Skeens.D.  On: 04/05/2018 17:31    Procedures Procedures (including critical care time)  Medications Ordered in ED Medications - No data to display   Initial Impression / Assessment and Plan / ED Course  I have reviewed the triage vital signs and the nursing  notes.  Pertinent labs & imaging results that were available during my care of the patient were reviewed by me and considered in my medical decision making (see chart for details).     6:46 PM Patient in no distress, awake, alert, no additional complaints. We discussed all findings including generally reassuring labs. Patient does have slight decline in his hemoglobin value, slight increase in his leukocytosis, but no x-ray evidence for pneumonia, no fever, and this may be reactive. Note active bleeding.  Patient has a primary care visit scheduled in 3 days, we discussed importance of keeping that appointment, given reassuring findings here, no evidence for sustained arrhythmia, nose for ACS, no evidence for ongoing bleeding the patient will follow-up as an outpatient.  Final Clinical Impressions(s) / ED Diagnoses  Dizziness   Carmin Muskrat, MD 04/05/18 [id number]

## 2018-04-08 ENCOUNTER — Ambulatory Visit: Payer: 59 | Admitting: Family Medicine

## 2018-04-08 ENCOUNTER — Encounter: Payer: Self-pay | Admitting: Family Medicine

## 2018-04-08 VITALS — BP 106/62 | HR 74 | Temp 98.2°F | Wt 273.8 lb

## 2018-04-08 DIAGNOSIS — B370 Candidal stomatitis: Secondary | ICD-10-CM | POA: Diagnosis not present

## 2018-04-08 DIAGNOSIS — Z23 Encounter for immunization: Secondary | ICD-10-CM | POA: Diagnosis not present

## 2018-04-08 DIAGNOSIS — H9191 Unspecified hearing loss, right ear: Secondary | ICD-10-CM

## 2018-04-08 DIAGNOSIS — G4733 Obstructive sleep apnea (adult) (pediatric): Secondary | ICD-10-CM | POA: Diagnosis not present

## 2018-04-08 MED ORDER — TRAZODONE HCL 50 MG PO TABS: 25.0000 mg | ORAL_TABLET | Freq: Every evening | ORAL | 0 refills | Status: DC | PRN

## 2018-04-08 NOTE — Progress Notes (Signed)
   Subjective:    Patient ID: [name], male    DOB: May 30, 1956, 62 y.o.   MRN: [id number]  HPI He  is here for evaluation of multiple issues.  He was recently seen by an oral surgeon for his continued difficulty with thrush.  They did give him nystatin which seems to be helping.  He is using it 3 or 4 times per day.  He states that this usually occurs and will work short-term but long-term it does not seem to do well.  He does use a CPAP but does not clean this regularly.  He does complain of insomnia in spite of the CPAP.  He states that he wakes up after several hours.  Recent CPAP evaluation showed that he used it less than 50% of the time under 4 hours.  He states that he wakes up and cannot fall back asleep and does have difficulty with falling asleep during the day.  He also complains of decreased hearing in the right ear.   Review of Systems     Objective:   Physical Exam Alert and in no distress.  Right and left TM and canals are totally normal.  Hearing test did show high-frequency hearing loss.       Assessment & Plan:  Candidiasis, mouth  Obstructive sleep apnea - Plan: traZODone (DESYREL) 50 MG tablet  Hearing loss of right ear, unspecified hearing loss type - Plan: Ambulatory referral to Audiology  Need for influenza vaccination - Plan: Flu Vaccine QUAD 6+ mos PF IM (Fluarix Quad PF) Use the sleep medicine for about 30 days and get a read out at the end of those 30 days then come in to see me Talk to the CPAP people and find out how often you are supposed to clean your machine and make sure you clean it appropriately Cut back on the nystatin to twice per day for the next week or 2 and then cut down to 1 a day based on keeping the symptoms under control

## 2018-04-08 NOTE — Patient Instructions (Signed)
Use the sleep medicine for about 30 days and get a read out at the end of those 30 days then come in to see me Talk to the CPAP people and find out how often you are supposed to clean your machine and make sure you clean it appropriately Cut back on the nystatin to twice per day for the next week or 2 and then cut down to 1 a day based on keeping the symptoms under control

## 2018-04-09 ENCOUNTER — Telehealth: Payer: Self-pay

## 2018-04-09 NOTE — Telephone Encounter (Signed)
Patient has called to inform you that he has had the best night sleep than he has had in years. And he says thank you.

## 2018-04-21 ENCOUNTER — Telehealth: Payer: Self-pay | Admitting: Family Medicine

## 2018-04-21 DIAGNOSIS — M7042 Prepatellar bursitis, left knee: Secondary | ICD-10-CM | POA: Diagnosis not present

## 2018-04-21 DIAGNOSIS — M25562 Pain in left knee: Secondary | ICD-10-CM | POA: Diagnosis not present

## 2018-04-21 DIAGNOSIS — M25511 Pain in right shoulder: Secondary | ICD-10-CM | POA: Diagnosis not present

## 2018-04-21 NOTE — Telephone Encounter (Signed)
LVM for pt about advice. [name]

## 2018-04-21 NOTE — Telephone Encounter (Signed)
Pt called and states that his pain management dr told him he had Prepatellar bursitis and states that the dr will send over notes, and states that it will go away over time pt can be reached at [phone]

## 2018-04-21 NOTE — Telephone Encounter (Signed)
He can treat this with heat for 20 minutes 3 times per day and Aleve, 2 pills twice per day per

## 2018-04-29 DIAGNOSIS — E119 Type 2 diabetes mellitus without complications: Secondary | ICD-10-CM | POA: Diagnosis not present

## 2018-04-29 DIAGNOSIS — H40013 Open angle with borderline findings, low risk, bilateral: Secondary | ICD-10-CM | POA: Diagnosis not present

## 2018-04-29 DIAGNOSIS — H35033 Hypertensive retinopathy, bilateral: Secondary | ICD-10-CM | POA: Diagnosis not present

## 2018-05-08 ENCOUNTER — Other Ambulatory Visit: Payer: Self-pay | Admitting: Family Medicine

## 2018-05-08 DIAGNOSIS — B370 Candidal stomatitis: Secondary | ICD-10-CM

## 2018-05-08 NOTE — Telephone Encounter (Signed)
lvm due to no answer. KH

## 2018-05-08 NOTE — Telephone Encounter (Signed)
Call the patient and see if he wants a refill on this.  If so call it in

## 2018-05-08 NOTE — Telephone Encounter (Signed)
[facility] Please advise. KH

## 2018-05-09 NOTE — Telephone Encounter (Signed)
Patient has called and stated he does want the refill on this. Being sent

## 2018-05-13 ENCOUNTER — Ambulatory Visit: Payer: 59 | Admitting: Family Medicine

## 2018-05-14 ENCOUNTER — Ambulatory Visit: Payer: 59 | Admitting: Family Medicine

## 2018-05-14 VITALS — BP 108/68 | HR 84 | Temp 98.4°F | Wt 269.0 lb

## 2018-05-14 DIAGNOSIS — B370 Candidal stomatitis: Secondary | ICD-10-CM | POA: Diagnosis not present

## 2018-05-14 MED ORDER — ITRACONAZOLE 100 MG PO CAPS: ORAL_CAPSULE | ORAL | 0 refills | Status: DC

## 2018-05-14 NOTE — Progress Notes (Signed)
   Subjective:    Patient ID: [name], male    DOB: Nov 21, 1955, 62 y.o.   MRN: [id number]  HPI He is here for consult concerning continued difficulty with oral candidiasis.  He has been on Diflucan as well as nystatin oral.  Apparently this works but as soon as he stops it always comes back.  He has been seen in the past by infectious disease.   Review of Systems     Objective:   Physical Exam Alert and in no distress.  Whitish lesions noted that were KOH positive.       Assessment & Plan:  Candidiasis, mouth - Plan: itraconazole (SPORANOX) 100 MG capsule Since he has not responded to nystatin or Diflucan, I will try Sporanox.He is to take 200 mg daily for the next 2 weeks and then call me.  If he has continued difficulty with this, I will discuss this further with infectious disease to see what else we can do.

## 2018-05-14 NOTE — Patient Instructions (Signed)
Call me in 2 weeks and let me know how you are doing and if this reoccurs I will check with infectious disease on what to do next

## 2018-05-23 ENCOUNTER — Ambulatory Visit: Payer: 59 | Admitting: Podiatry

## 2018-05-31 ENCOUNTER — Other Ambulatory Visit: Payer: Self-pay | Admitting: Family Medicine

## 2018-05-31 DIAGNOSIS — G4733 Obstructive sleep apnea (adult) (pediatric): Secondary | ICD-10-CM

## 2018-06-02 NOTE — Telephone Encounter (Signed)
[facility]. Please advise [facility] 

## 2018-06-04 ENCOUNTER — Telehealth: Payer: Self-pay | Admitting: Family Medicine

## 2018-06-04 ENCOUNTER — Ambulatory Visit: Payer: 59 | Admitting: Podiatry

## 2018-06-04 ENCOUNTER — Encounter: Payer: Self-pay | Admitting: Podiatry

## 2018-06-04 DIAGNOSIS — B351 Tinea unguium: Secondary | ICD-10-CM | POA: Diagnosis not present

## 2018-06-04 DIAGNOSIS — M79676 Pain in unspecified toe(s): Secondary | ICD-10-CM

## 2018-06-04 DIAGNOSIS — E118 Type 2 diabetes mellitus with unspecified complications: Secondary | ICD-10-CM

## 2018-06-04 NOTE — Patient Instructions (Signed)

## 2018-06-04 NOTE — Telephone Encounter (Signed)
Multiple attempts were made to reach him however the phone keeps getting disconnected

## 2018-06-04 NOTE — Telephone Encounter (Signed)
Pt called and stated that he today he took his 3 antibiotic. He states he is not any better and he feels worse. [facility] uses [facility] [address] and can be reached at work at [phone]. Please advise pt.

## 2018-06-11 ENCOUNTER — Ambulatory Visit: Payer: 59 | Admitting: Audiology

## 2018-06-16 NOTE — Progress Notes (Addendum)
Subjective: [name] presents today with painful, thick toenails 1-5 b/l that he cannot cut and which interfere with daily activities.  Pain is aggravated when wearing enclosed shoe gear.  Denita Lung, MD is his PCP and his last DOS was 05/14/2018.   Current Outpatient Medications:  .  ASPIRIN ADULT LOW STRENGTH 81 MG EC tablet, TAKE 1 TABLET BY MOUTH AT  BEDTIME, Disp: 90 tablet, Rfl: 0 .  azelastine (ASTELIN) 0.1 % nasal spray, USE 1 SPRAY IN EACH NOSTRIL AT BEDTIME, Disp: 30 mL, Rfl: 5 .  B Complex-C (B-COMPLEX WITH VITAMIN C) tablet, Take 1 tablet by mouth 2 (two) times daily., Disp: , Rfl:  .  Blood Glucose Monitoring Suppl (ONETOUCH VERIO IQ SYSTEM) W/DEVICE KIT, 1 each by Does not apply route daily., Disp: 1 kit, Rfl: 0 .  buPROPion (WELLBUTRIN SR) 200 MG 12 hr tablet, TAKE 1 TABLET BY MOUTH TWO  TIMES DAILY, Disp: 180 tablet, Rfl: 0 .  calcium-vitamin D 250-100 MG-UNIT per tablet, Take 1 tablet by mouth 2 (two) times daily.  , Disp: , Rfl:  .  cetirizine (ZYRTEC) 10 MG tablet, Take 10 mg by mouth daily.  , Disp: , Rfl:  .  clotrimazole (MYCELEX) 10 MG troche, Dissolve 1 tablet on tongue 3 times a day, Disp: , Rfl:  .  desvenlafaxine (PRISTIQ) 50 MG 24 hr tablet, TAKE 1 TABLET BY MOUTH  DAILY., Disp: 90 tablet, Rfl: 1 .  empagliflozin (JARDIANCE) 25 MG TABS tablet, Take 25 mg by mouth daily., Disp: , Rfl:  .  EUCRISA 2 % OINT, APPLY TOPICALLY TO PROBLEMATIC AREAS 2 TIMES DAILY, Disp: , Rfl: 3 .  fluconazole (DIFLUCAN) 100 MG tablet, Take one pill po qd, Disp: 15 tablet, Rfl: 0 .  fluconazole (DIFLUCAN) 100 MG tablet, TAKE 1 TABLET BY MOUTH  EVERY DAY, Disp: 90 tablet, Rfl: 0 .  fluticasone (FLONASE) 50 MCG/ACT nasal spray, 50Use as directed once a day for allergies, Disp: 48 g, Rfl: 3 .  ibuprofen (ADVIL,MOTRIN) 200 MG tablet, Take 2 tablets 1-2 times a day as needed, Disp: , Rfl:  .  Insulin Glargine (BASAGLAR KWIKPEN) 100 UNIT/ML SOPN, , Disp: , Rfl:  .  Insulin Pen  Needle (FIFTY50 PEN NEEDLES) 32G X 4 MM MISC, Use to inject insulin once a day or as directed, Disp: , Rfl:  .  Insulin Syringe-Needle U-100 (INSULIN SYRINGE 1CC/31GX5/16") 31G X 5/16" 1 ML MISC, Use as directed once daily ( Dx code E 11.65), Disp: , Rfl:  .  itraconazole (SPORANOX) 100 MG capsule, Take 2 capsules daily for 2 weeks, Disp: 28 capsule, Rfl: 0 .  lisinopril (PRINIVIL,ZESTRIL) 10 MG tablet, Take 10 mg by mouth daily.  , Disp: , Rfl:  .  metFORMIN (GLUCOPHAGE) 1000 MG tablet, Take 1,000 mg by mouth 2 (two) times daily with a meal.  , Disp: , Rfl:  .  montelukast (SINGULAIR) 10 MG tablet, , Disp: , Rfl:  .  Multiple Vitamin (MULTI-VITAMINS) TABS, Take 1 tablet twice a day, Disp: , Rfl:  .  Multiple Vitamins-Minerals (MULTIVITAMIN WITH MINERALS) tablet, Take 1 tablet by mouth 2 (two) times daily.  , Disp: , Rfl:  .  nystatin (MYCOSTATIN) 100000 UNIT/ML suspension, SWISH AND SPIT 4 ML TO 6 ML 4 TIMES A DAY, Disp: 100 mL, Rfl: 0 .  omeprazole (PRILOSEC) 40 MG capsule, TAKE 1 CAPSULE BY MOUTH  DAILY, Disp: 90 capsule, Rfl: 3 .  ONETOUCH DELICA LANCETS 26E MISC, , Disp: ,  Rfl:  .  ONETOUCH VERIO test strip, , Disp: , Rfl:  .  rosuvastatin (CRESTOR) 5 MG tablet, Take 1 tablet (5 mg total) by mouth daily., Disp: 90 tablet, Rfl: 3 .  traZODone (DESYREL) 50 MG tablet, TAKE 1/2 TO 1 TABLET BY MOUTH AT BEDTIME AS NEEDED FOR SLEEP, Disp: 30 tablet, Rfl: 0 .  triamcinolone ointment (KENALOG) 0.1 %, , Disp: , Rfl:   Allergies  Allergen Reactions  . Latex Itching    Objective:  Vascular Examination: Capillary refill time immediate x 10 digits Dorsalis pedis and Posterior tibial pulses palpable b/l Digital hair present x 10 digits Skin temperature gradient WNL b/l  Dermatological Examination: Skin with normal turgor, texture and tone b/l  Toenails 1-5 b/l discolored, thick, dystrophic with subungual debris and pain with palpation to nailbeds due to thickness of nails.  Well healed scar  along 1st metatarsal left foot from previous bunion surgery  Musculoskeletal: Muscle strength 5/5 to all LE muscle groups  Hammertoe left 2nd digit  Neurological: Sensation intact with 10 gram monofilament. Vibratory sensation intact.  Assessment: Painful onychomycosis toenails 1-5 b/l  Hammertoe left 2nd toe  Plan: 1. Toenails 1-5 b/l were debrided in length and girth without iatrogenic bleeding. 2. Dispensed silicone toe crest for left 2nd digit hammertoe 3. Patient to continue soft, supportive shoe gear 4. Patient to report any pedal injuries to medical professional immediately. 5. Follow up 3 months. Patient/POA to call should there be a concern in the interim.

## 2018-06-20 ENCOUNTER — Telehealth: Payer: Self-pay | Admitting: Family Medicine

## 2018-06-20 DIAGNOSIS — B370 Candidal stomatitis: Secondary | ICD-10-CM

## 2018-06-20 MED ORDER — ITRACONAZOLE 100 MG PO CAPS: ORAL_CAPSULE | ORAL | 0 refills | Status: DC

## 2018-06-20 NOTE — Telephone Encounter (Signed)
Pt called and stated that he has finished the medication. He states that rash was clearing up but is now coming back. Pt is requesting another round of medication. [facility] [address] and can be reached at [phone].

## 2018-06-23 ENCOUNTER — Other Ambulatory Visit: Payer: Self-pay | Admitting: Medical

## 2018-06-23 NOTE — Telephone Encounter (Signed)
Is this okay to refill? 

## 2018-07-01 ENCOUNTER — Other Ambulatory Visit: Payer: Self-pay | Admitting: Family Medicine

## 2018-07-01 DIAGNOSIS — G4733 Obstructive sleep apnea (adult) (pediatric): Secondary | ICD-10-CM

## 2018-07-01 NOTE — Telephone Encounter (Signed)
[facility]. Please advise [facility] 

## 2018-07-03 ENCOUNTER — Telehealth: Payer: Self-pay | Admitting: Family Medicine

## 2018-07-03 NOTE — Telephone Encounter (Signed)
Pt called and stated he didn't get his medication from [facility] Itraconazole said because it wasn't 90 days they wouldn't send it.  [facility]. [facility] after I [facility] and they had not sent out the RX and I cancelled it

## 2018-07-20 ENCOUNTER — Other Ambulatory Visit: Payer: Self-pay | Admitting: Family Medicine

## 2018-07-21 NOTE — Telephone Encounter (Signed)
[facility] is requesting to fill pt wellbutrin. Please advise. KH 

## 2018-07-25 ENCOUNTER — Other Ambulatory Visit: Payer: Self-pay

## 2018-07-25 ENCOUNTER — Telehealth: Payer: Self-pay

## 2018-07-25 DIAGNOSIS — B370 Candidal stomatitis: Secondary | ICD-10-CM

## 2018-07-25 NOTE — Telephone Encounter (Signed)
LVM for pt about referral and call the office if he had any other questions. KH

## 2018-07-25 NOTE — Telephone Encounter (Signed)
Refer to ID

## 2018-07-25 NOTE — Telephone Encounter (Signed)
Pt called and is requesting to [facility] longer than two weeks pt has been out for a few days and having issues since . Please advise so that pt acn be called back with info. Thanks [employer]

## 2018-08-04 LAB — MICROALBUMIN, URINE

## 2018-08-04 LAB — HEMOGLOBIN A1C: Hemoglobin A1C: 6.3

## 2018-08-11 ENCOUNTER — Other Ambulatory Visit: Payer: Self-pay | Admitting: Medical

## 2018-08-12 NOTE — Telephone Encounter (Signed)
Is this ok to refill?  

## 2018-08-12 NOTE — Telephone Encounter (Signed)
yours

## 2018-08-20 ENCOUNTER — Ambulatory Visit: Payer: 59 | Admitting: Internal Medicine

## 2018-08-27 ENCOUNTER — Ambulatory Visit: Payer: 59 | Admitting: Internal Medicine

## 2018-08-29 ENCOUNTER — Encounter: Payer: Self-pay | Admitting: Gastroenterology

## 2018-09-03 ENCOUNTER — Telehealth: Payer: Self-pay

## 2018-09-03 ENCOUNTER — Ambulatory Visit: Payer: 59 | Admitting: Podiatry

## 2018-09-03 ENCOUNTER — Ambulatory Visit (INDEPENDENT_AMBULATORY_CARE_PROVIDER_SITE_OTHER): Payer: 59 | Admitting: Internal Medicine

## 2018-09-03 ENCOUNTER — Encounter: Payer: Self-pay | Admitting: Internal Medicine

## 2018-09-03 VITALS — BP 128/77 | HR 78 | Temp 98.4°F | Wt 269.0 lb

## 2018-09-03 DIAGNOSIS — B370 Candidal stomatitis: Secondary | ICD-10-CM | POA: Diagnosis not present

## 2018-09-03 NOTE — Addendum Note (Signed)
Addended by: Mariea Clonts D on: 09/03/2018 04:03 PM   Modules accepted: Orders

## 2018-09-03 NOTE — Progress Notes (Signed)
[facility]  Reason for Consult: Possible persistent thrush Referring Provider: Dr. Jill Alexanders  Assessment: This is certainly not the typical clinical picture of thrush given no obvious risk factor and the unusual distribution of the lesions.  I wonder if he may have some trauma intraorally related to his CPAP mask predisposing him to localized thrush.  He certainly may have developed some resistant Candida given his extensive exposure to antifungal therapy over the last several years.  I obtained a swab specimen of 1 of the lesions today to send for fungal culture.  If Candida is isolated I will order susceptibility testing.  Plan: 1. Fungal culture (I will call him with the results)  Patient Active Problem List   Diagnosis Date Noted  . Candidiasis, mouth 03/22/2016    Priority: High  . Nummular eczema 08/13/2017  . Onychomycosis 08/30/2016  . Status post bariatric surgery 08/06/2016  . Recurrent infections 03/22/2016  . Passive-dependent personality disorder ([place]) 08/02/2015  . Acid reflux 06/16/2015  . Hypertension associated with diabetes ([place]) 06/16/2015  . Obstructive sleep apnea 07/09/2014  . Abnormal CT scan 12/23/2012  . Diabetes ([place]) 05/11/2008  . Hyperlipidemia associated with type 2 diabetes mellitus ([place]) 05/11/2008  . GERD 05/11/2008    Patient's Medications  New Prescriptions   No medications on file  Previous Medications   ASPIRIN ADULT LOW STRENGTH 81 MG EC TABLET    TAKE 1 TABLET BY MOUTH AT  BEDTIME   AZELASTINE (ASTELIN) 0.1 % NASAL SPRAY    USE 1 SPRAY IN EACH NOSTRIL AT BEDTIME   B COMPLEX-C (B-COMPLEX WITH VITAMIN C) TABLET    Take 1 tablet by mouth 2 (two) times daily.   BLOOD GLUCOSE MONITORING SUPPL (ONETOUCH VERIO IQ SYSTEM) W/DEVICE KIT    1 each by Does not apply route daily.   BUPROPION (WELLBUTRIN SR) 200 MG 12 HR TABLET    TAKE 1 TABLET BY MOUTH TWO  TIMES DAILY   CALCIUM-VITAMIN D 250-100 MG-UNIT PER  TABLET    Take 1 tablet by mouth 2 (two) times daily.     CETIRIZINE (ZYRTEC) 10 MG TABLET    Take 10 mg by mouth daily.     CLOTRIMAZOLE (MYCELEX) 10 MG TROCHE    DISSOLVE 1 TROCHE SLOWLY IN MOUTH 5 TIMES A DAY   DESVENLAFAXINE (PRISTIQ) 50 MG 24 HR TABLET    TAKE 1 TABLET BY MOUTH  DAILY.   EMPAGLIFLOZIN (JARDIANCE) 25 MG TABS TABLET    Take 25 mg by mouth daily.   EUCRISA 2 % OINT    APPLY TOPICALLY TO PROBLEMATIC AREAS 2 TIMES DAILY   FLUCONAZOLE (DIFLUCAN) 100 MG TABLET    Take one pill po qd   FLUTICASONE (FLONASE) 50 MCG/ACT NASAL SPRAY    50Use as directed once a day for allergies   IBUPROFEN (ADVIL,MOTRIN) 200 MG TABLET    Take 2 tablets 1-2 times a day as needed   INSULIN GLARGINE (BASAGLAR KWIKPEN) 100 UNIT/ML SOPN       INSULIN PEN NEEDLE (FIFTY50 PEN NEEDLES) 32G X 4 MM MISC    Use to inject insulin once a day or as directed   INSULIN SYRINGE-NEEDLE U-100 (INSULIN SYRINGE 1CC/31GX5/16") 31G X 5/16" 1 ML MISC    Use as directed once daily ( Dx code E 11.65)   ITRACONAZOLE (SPORANOX) 100 MG CAPSULE    Take 2 capsules daily for 2 weeks   LISINOPRIL (PRINIVIL,ZESTRIL) 10 MG  TABLET    Take 10 mg by mouth daily.     METFORMIN (GLUCOPHAGE) 1000 MG TABLET    Take 1,000 mg by mouth 2 (two) times daily with a meal.     MONTELUKAST (SINGULAIR) 10 MG TABLET       MULTIPLE VITAMIN (MULTI-VITAMINS) TABS    Take 1 tablet twice a day   MULTIPLE VITAMINS-MINERALS (MULTIVITAMIN WITH MINERALS) TABLET    Take 1 tablet by mouth 2 (two) times daily.     NYSTATIN (MYCOSTATIN) 100000 UNIT/ML SUSPENSION    SWISH AND SPIT 4 ML TO 6 ML 4 TIMES A DAY   OMEPRAZOLE (PRILOSEC) 40 MG CAPSULE    TAKE 1 CAPSULE BY MOUTH  DAILY   ONETOUCH DELICA LANCETS 36O MISC       ONETOUCH VERIO TEST STRIP       ROSUVASTATIN (CRESTOR) 5 MG TABLET    Take 1 tablet (5 mg total) by mouth daily.   TRAZODONE (DESYREL) 50 MG TABLET    TAKE 1/2 TO 1 TABLET BY MOUTH AT BEDTIME AS NEEDED FOR SLEEP   TRIAMCINOLONE OINTMENT (KENALOG) 0.1  %      Modified Medications   No medications on file  Discontinued Medications   FLUCONAZOLE (DIFLUCAN) 100 MG TABLET    TAKE 1 TABLET BY MOUTH  EVERY DAY    HPI: [name] is a 63 y.o. male who developed white plaques on the inside of his mouth several years ago.  This began about a year after he started using a CPAP mask nightly for obstructive sleep apnea.  He has been evaluated by multiple providers for this including his PCP, Dr. Redmond School, two oral surgeons and 1 of my partners, Dr. Dietrich [place] in 2017.  A biopsy in 2017 showed benign squamous hyperplasia with acute inflammation and abundant intraepithelial hyphae compatible with Candida.  He has diabetes but no clear-cut risk factor for thrush.  He has not been on steroids and is HIV negative.  He has been treated with fluconazole, nystatin, itraconazole and clotrimazole.  When he first used fluconazole and nystatin he noted prompt improvement but over the past year he feels like they quit working.  He was started on itraconazole last November but did not notice any improvement.  He has been using clotrimazole troches intermittently for years.  He still feels like he gets some relief but also thinks it may be less effective recently.  In addition to seeing the white spots in the mirror he notes some burning and stinging when he brushes his teeth and rinses his mouth.  He recently started using Biotene mouth rinse.  Review of Systems: Review of Systems  Constitutional: Negative for chills, diaphoresis, fever and weight loss.  HENT: Negative for congestion and sore throat.   Skin: Negative for rash.      Past Medical History:  Diagnosis Date  . Anxiety   . Arthritis    back, shoulders, left great toe  . Candidiasis, mouth 03/22/2016  . Carpal tunnel syndrome of right wrist 10/2014  . Dental crowns present   . Depression   . Dyslipidemia   . Ear fullness 08/30/2016  . GERD (gastroesophageal reflux disease)   . Hypertension     under control with med., per pt.; has been on med. x 15-20 yr.  . Insulin dependent diabetes mellitus ([place])   . Nummular eczema   . Obesity   . Onychomycosis 08/30/2016  . Recurrent infections 03/22/2016  . Sleep apnea  uses CPAP nightly    Social History   Tobacco Use  . Smoking status: Former Smoker    Last attempt to quit: 07/03/2007    Years since quitting: 11.1  . Smokeless tobacco: Never Used  Substance Use Topics  . Alcohol use: Yes    Comment: seldom  . Drug use: No    Family History  Problem Relation Age of Onset  . Heart disease Mother   . Cancer Mother 28       Multiple myeloma  . Diabetes Father   . Diabetes Brother   . Heart disease Brother 34       cardiomyopathy  . Heart disease Brother 65       MI  . Arthritis Paternal Grandmother   . Diabetes Paternal Grandmother   . Heart disease Paternal Grandmother   . Stroke Paternal Grandmother    Allergies  Allergen Reactions  . Latex Itching    OBJECTIVE: Vitals:   09/03/18 1348  BP: 128/77  Pulse: 78  Temp: 98.4 F (36.9 C)  Weight: 269 lb (122 kg)   Body mass index is 36.48 kg/m.   Physical Exam Constitutional:      Comments: He is pleasant and in no distress.  HENT:     Mouth/Throat:     Mouth: Mucous membranes are moist.     Pharynx: Oropharyngeal exudate present.     Comments: He has a few scattered white plaques inside his lips.  There are no unusual lesions extending back on his palate or buccal mucosa.  There are no unusual lesions on his tongue.    Microbiology: No results found for this or any previous visit (from the past 240 hour(s)).  Michel Bickers, MD [facility] [phone] pager   [phone] cell 09/03/2018, 3:02 PM

## 2018-09-03 NOTE — Telephone Encounter (Signed)
Error

## 2018-09-04 ENCOUNTER — Telehealth: Payer: Self-pay

## 2018-09-04 NOTE — Telephone Encounter (Signed)
Routing to Dr. Orvan Falconer to make him aware.  Valarie Cones, LPN

## 2018-09-04 NOTE — Telephone Encounter (Signed)
-----   Message from Arivaca Celedonio sent at 09/04/2018  4:21 PM EST ----- Regarding: Oral Surgeon information Contact: self- [phone] Patient called wanting to leave some information for Dr Orvan Falconer. Said that his oral surgeon is Yolanda Manges, and his office telephone number is [phone]

## 2018-09-06 ENCOUNTER — Other Ambulatory Visit: Payer: Self-pay | Admitting: Family Medicine

## 2018-09-08 NOTE — Telephone Encounter (Signed)
Is this okay to refill? 

## 2018-09-09 ENCOUNTER — Ambulatory Visit: Payer: 59 | Admitting: Podiatry

## 2018-09-09 ENCOUNTER — Encounter: Payer: Self-pay | Admitting: Podiatry

## 2018-09-09 DIAGNOSIS — M779 Enthesopathy, unspecified: Secondary | ICD-10-CM

## 2018-09-09 DIAGNOSIS — E118 Type 2 diabetes mellitus with unspecified complications: Secondary | ICD-10-CM | POA: Diagnosis not present

## 2018-09-09 DIAGNOSIS — M79676 Pain in unspecified toe(s): Secondary | ICD-10-CM | POA: Diagnosis not present

## 2018-09-09 DIAGNOSIS — B351 Tinea unguium: Secondary | ICD-10-CM | POA: Diagnosis not present

## 2018-09-09 DIAGNOSIS — M2042 Other hammer toe(s) (acquired), left foot: Secondary | ICD-10-CM | POA: Diagnosis not present

## 2018-09-09 DIAGNOSIS — M778 Other enthesopathies, not elsewhere classified: Secondary | ICD-10-CM

## 2018-09-09 NOTE — Progress Notes (Signed)
He presents today for follow-up of his painful second metatarsal phalangeal joint left.  States that is really been bothering me recently and I like to have her toenails trimmed.  Objective: Signs are stable he is alert and oriented x3.  Pulses are palpable.  Neurologic sensorium is intact.  DP reflexes are intact.  He has a rigid hammertoe deformity plantar flexor in nature PIPJ second digit left foot with pain on palpation and end range of motion of the second metatarsophalangeal joint left foot.  Otherwise his nails are long thick yellow dystrophic possibly mycotic.  Assessment: Hammertoe deformity second left capsulitis second metatarsal phalangeal joint left and pain in limb secondary to onychomycosis.  Plan: Debridement of toenails 1 through 5 bilateral covered service secondary to pain.  Injected around the second metatarsal phalangeal joint today with Kenalog and local anesthetic 10 mg and 5 mg.  Tolerated procedure well without complications.  Follow-up with him in about 1 month

## 2018-09-09 NOTE — Addendum Note (Signed)
Addended by: Lottie Rater E on: 09/09/2018 05:00 PM   Modules accepted: Orders

## 2018-09-22 ENCOUNTER — Telehealth: Payer: Self-pay | Admitting: Family Medicine

## 2018-09-22 ENCOUNTER — Telehealth: Payer: Self-pay | Admitting: *Deleted

## 2018-09-22 LAB — SUSCEPTIBILITY,YEAST LIMITED PANEL
Micro Number: 316958
Specimen Quality: ADEQUATE

## 2018-09-22 LAB — TEST AUTHORIZATION: TEST CODE:: 36271

## 2018-09-22 LAB — FUNGUS CULTURE W SMEAR
MICRO NUMBER:: 279274
SPECIMEN QUALITY:: ADEQUATE

## 2018-09-22 NOTE — Telephone Encounter (Signed)
Pt called and said he called Dr.John Campbells office for his results but would not give them to him. They asked if he wanted to speak to some ones else. He wanted to let you know what was going on.

## 2018-09-22 NOTE — Telephone Encounter (Signed)
Labs not yet in [facility], but received from [facility]. Pansensitive per Dr Luciana Axe.   Final Candida albicans    INT MIC Capsofungin   S <=0.25 Fluconazole  S 2 Voriconazole  S <=0.12  Please advise.

## 2018-09-22 NOTE — Telephone Encounter (Signed)
RN returned patient's call, left message asking him to call back.  Dr Orvan Falconer wanted susceptibility testing if lab resulted with candida.  RN asked our lab to check and see if this was being done at [facility].  Andree Coss, RN

## 2018-09-22 NOTE — Telephone Encounter (Signed)
It is good to know that he does not have a fluconazole resistant Candida albicans.  This supports my strong suspicion that the white patches he sees in his mouth are not due to thrush.

## 2018-09-22 NOTE — Telephone Encounter (Signed)
-----   Message from Milagros Reap sent at 09/22/2018  3:01 PM EDT ----- Regarding: lab results Patient called left message that he saw Dr Orvan Falconer on 09-03-2018 and he did tests he wants his test results Call back # [phone]

## 2018-10-01 ENCOUNTER — Telehealth: Payer: Self-pay

## 2018-10-01 ENCOUNTER — Other Ambulatory Visit: Payer: Self-pay | Admitting: Medical

## 2018-10-01 NOTE — Telephone Encounter (Signed)
Patient called concerned about next step and MD plans. Patient reports pain in his mouth and continues to have watch patches. Routing to MD for advise.  Valarie Cones, LPN

## 2018-10-01 NOTE — Telephone Encounter (Signed)
Is this ok to refill?  

## 2018-10-01 NOTE — Telephone Encounter (Signed)
Pt called again today about this same issue.

## 2018-10-01 NOTE — Telephone Encounter (Signed)
Please let him know that his culture did grow Candida.  The isolate remains very sensitive to fluconazole which should work as well as anything.  Ask him if he has gotten a new CPAP mask to see if that would help with the lesions.

## 2018-10-01 NOTE — Telephone Encounter (Signed)
Resend to Dr. Susann Givens, his patient

## 2018-10-02 NOTE — Telephone Encounter (Signed)
Attempted to reach patient. Will call back later. Valarie Cones, LPN

## 2018-10-03 ENCOUNTER — Ambulatory Visit: Payer: 59 | Admitting: Family Medicine

## 2018-10-03 ENCOUNTER — Encounter: Payer: Self-pay | Admitting: Family Medicine

## 2018-10-03 ENCOUNTER — Other Ambulatory Visit: Payer: Self-pay

## 2018-10-03 VITALS — Wt 263.0 lb

## 2018-10-03 DIAGNOSIS — B370 Candidal stomatitis: Secondary | ICD-10-CM

## 2018-10-03 DIAGNOSIS — K1379 Other lesions of oral mucosa: Secondary | ICD-10-CM | POA: Diagnosis not present

## 2018-10-03 NOTE — Progress Notes (Signed)
Documentation for virtual telephone encounter.  Documentation for virtual audio and video telecommunications through Zoom encounter:  The patient was located at home. The provider was located in the office. The patient did consent to this visit and is aware of possible charges through their insurance for this visit. The other persons participating in this telemedicine service were none. This virtual service is not related to other E/M service within previous 7 days.     Subjective:    Patient ID: [name], male    DOB: [birth year], 63 y.o.   MRN: [id number]  HPI He continues to complain of a painful mouth especially if his brushes his teeth or uses any oral rinses.  He is now using nonalcoholic rinses.  He was recently seen by [name] and cultures were done which did show Candida sensitive to everything.  I discussed this with [name] to get his input into it.  The patient has tried multiple antifungal medications all of which have not cleared it up entirely.  His immune system seems to be intact. There was concern over whether the CPAP mask was contributing to that but [name] says that his mask is not changed in several years   Review of Systems     Objective:   Physical Exam Alert and in no distress otherwise not examined.       Assessment & Plan:  Oral pain  Candidiasis, mouth I discussed with patient the fact that I talked to [name] about this.  I explained that we both do not feel comfortable to continue him on an antifungal medicine which would increase his risk of becoming resistant to it.  My recommendation to him would be to make sure that he keep his CPAP mask and supplies clean.  Also recommend a good probiotic and possibly try half water half peroxide as a ranch.  Also mention the possibility of following up with an oral surgeon to see if he has any thoughts on this.

## 2018-10-05 ENCOUNTER — Other Ambulatory Visit: Payer: Self-pay | Admitting: Family Medicine

## 2018-10-07 ENCOUNTER — Ambulatory Visit: Payer: 59 | Admitting: Podiatry

## 2018-10-19 ENCOUNTER — Other Ambulatory Visit: Payer: Self-pay | Admitting: Family Medicine

## 2018-10-28 ENCOUNTER — Other Ambulatory Visit: Payer: Self-pay

## 2018-10-28 ENCOUNTER — Ambulatory Visit: Payer: 59 | Admitting: Podiatry

## 2018-10-28 ENCOUNTER — Encounter: Payer: Self-pay | Admitting: Podiatry

## 2018-10-28 VITALS — Temp 97.3°F

## 2018-10-28 DIAGNOSIS — M778 Other enthesopathies, not elsewhere classified: Secondary | ICD-10-CM

## 2018-10-28 DIAGNOSIS — M79676 Pain in unspecified toe(s): Secondary | ICD-10-CM

## 2018-10-28 DIAGNOSIS — B351 Tinea unguium: Secondary | ICD-10-CM

## 2018-10-28 DIAGNOSIS — E118 Type 2 diabetes mellitus with unspecified complications: Secondary | ICD-10-CM

## 2018-10-28 DIAGNOSIS — M779 Enthesopathy, unspecified: Principal | ICD-10-CM

## 2018-10-28 DIAGNOSIS — M7752 Other enthesopathy of left foot: Secondary | ICD-10-CM | POA: Diagnosis not present

## 2018-10-28 NOTE — Progress Notes (Signed)
He presents today for follow-up of capsulitis of the second metatarsal phalangeal joint of the left foot states that it still hurts a little bit but is doing much better.  Objective: Vital signs are stable he is alert and oriented x3 has mild tenderness on palpation dorsal lateral aspect of the second metatarsal phalangeal joint of the left foot.  There is minimal edema no erythema cellulitis drainage or odor.  Pain on end range of motion dorsally.  Assessment: Chronic intractable capsulitis second metatarsal phalangeal joint left pre-dislocation syndrome is present.  Plan: After sterile Betadine skin prep I injected 10 mg Kenalog 5 mg Marcaine dorsal lateral aspect of the left second metatarsal phalangeal joint.  Follow-up with him in a couple of months.

## 2018-10-29 ENCOUNTER — Telehealth: Payer: Self-pay | Admitting: Family Medicine

## 2018-10-29 NOTE — Telephone Encounter (Signed)
Pt called and is requesting a referral for a ENT states his ears are getting worse pt can be reached at [phone] he thought it was his allergies but they are not acting up so he is not for sure what it is now

## 2018-10-29 NOTE — Telephone Encounter (Signed)
ok 

## 2018-10-30 ENCOUNTER — Other Ambulatory Visit: Payer: Self-pay

## 2018-10-30 DIAGNOSIS — H9191 Unspecified hearing loss, right ear: Secondary | ICD-10-CM

## 2018-10-30 NOTE — Telephone Encounter (Signed)
Order put in for pt . KH

## 2018-11-04 ENCOUNTER — Other Ambulatory Visit: Payer: Self-pay | Admitting: Family Medicine

## 2018-11-05 NOTE — Telephone Encounter (Signed)
yours

## 2018-11-05 NOTE — Telephone Encounter (Signed)
Is this ok to refill?  

## 2018-11-16 ENCOUNTER — Other Ambulatory Visit: Payer: Self-pay | Admitting: Family Medicine

## 2018-11-18 ENCOUNTER — Other Ambulatory Visit: Payer: Self-pay | Admitting: Family Medicine

## 2018-11-19 ENCOUNTER — Encounter: Payer: Self-pay | Admitting: Family Medicine

## 2018-12-03 ENCOUNTER — Telehealth: Payer: Self-pay | Admitting: Family Medicine

## 2018-12-03 ENCOUNTER — Telehealth: Payer: Self-pay | Admitting: *Deleted

## 2018-12-03 DIAGNOSIS — Z20822 Contact with and (suspected) exposure to covid-19: Secondary | ICD-10-CM

## 2018-12-03 NOTE — Telephone Encounter (Signed)
-----   Message from Ronnald Nian, MD sent at 12/03/2018  1:56 PM EDT ----- Needs testing thanks due to potential exposure.

## 2018-12-03 NOTE — Telephone Encounter (Signed)
Left message on home number to return call to schedule testing. Pt called on mobile number and scheduled for testing on 12/03/18 at 10 am at the [facility] site. Pt advised to wear a mask and remain in car at the time of appt. Understanding verbalized.

## 2018-12-03 NOTE — Telephone Encounter (Signed)
lvm for pt . KH 

## 2018-12-03 NOTE — Telephone Encounter (Signed)
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

## 2018-12-03 NOTE — Telephone Encounter (Signed)
Let him know to expect a call

## 2018-12-04 ENCOUNTER — Other Ambulatory Visit: Payer: 59

## 2018-12-04 DIAGNOSIS — Z20822 Contact with and (suspected) exposure to covid-19: Secondary | ICD-10-CM

## 2018-12-06 LAB — NOVEL CORONAVIRUS, NAA: SARS-CoV-2, NAA: NOT DETECTED

## 2018-12-09 ENCOUNTER — Ambulatory Visit: Payer: 59 | Admitting: Podiatry

## 2018-12-11 ENCOUNTER — Encounter: Payer: Self-pay | Admitting: Family Medicine

## 2018-12-11 ENCOUNTER — Ambulatory Visit: Payer: 59 | Admitting: Family Medicine

## 2018-12-11 ENCOUNTER — Other Ambulatory Visit: Payer: Self-pay

## 2018-12-11 VITALS — BP 102/70 | HR 75 | Temp 98.2°F | Wt 266.2 lb

## 2018-12-11 DIAGNOSIS — Z794 Long term (current) use of insulin: Secondary | ICD-10-CM | POA: Diagnosis not present

## 2018-12-11 DIAGNOSIS — N492 Inflammatory disorders of scrotum: Secondary | ICD-10-CM

## 2018-12-11 DIAGNOSIS — E119 Type 2 diabetes mellitus without complications: Secondary | ICD-10-CM | POA: Diagnosis not present

## 2018-12-11 MED ORDER — DOXYCYCLINE HYCLATE 100 MG PO TABS: 100.0000 mg | ORAL_TABLET | Freq: Two times a day (BID) | ORAL | 0 refills | Status: DC

## 2018-12-11 NOTE — Progress Notes (Signed)
   Subjective:    Patient ID: [name], male    DOB: 23-Aug-1955, 63 y.o.   MRN: [id number]  HPI He complains of a 2-week history of increasing difficulty with pain and swelling in the scrotal area.  He has been using a triple antibiotic ointment and taking low doses of ibuprofen for pain relief.  No drainage is noted.  He does wear a pad because of urinary leakage apparently because of a diabetic bladder.  He has seen urology in the past for this.  He follows up regularly with Dr. Elyse Hsu   Review of Systems     Objective:   Physical Exam Alert and in no distress.  Exam of the scrotum does show erythema and excoriation.  No lesions noted on the inner thighs.       Assessment & Plan:  Cellulitis of scrotum - Plan: doxycycline (VIBRA-TABS) 100 MG tablet, I will get the last notes from urology.  We will also place him on doxycycline.  He is to call me the beginning of the week if no improvement to switch to a different antibiotic.

## 2018-12-15 ENCOUNTER — Telehealth: Payer: Self-pay | Admitting: Family Medicine

## 2018-12-15 MED ORDER — AMOXICILLIN-POT CLAVULANATE 875-125 MG PO TABS: 1.0000 | ORAL_TABLET | Freq: Two times a day (BID) | ORAL | 0 refills | Status: DC

## 2018-12-15 NOTE — Telephone Encounter (Signed)
Pt called and states that the doxycycline is not working he is still in a lot of pain, [facility] uses [id number] [place], [place] - [address] pt can be reached at [phone]

## 2018-12-15 NOTE — Telephone Encounter (Signed)
Have him go ahead and stop the doxycycline and switch to Augmentin.  Have him call me when he finishes the antibiotic.

## 2018-12-15 NOTE — Telephone Encounter (Signed)
Left pt message on both phones with instructions

## 2018-12-16 ENCOUNTER — Ambulatory Visit: Payer: 59 | Admitting: Podiatry

## 2018-12-17 ENCOUNTER — Encounter: Payer: Self-pay | Admitting: Family Medicine

## 2018-12-17 ENCOUNTER — Ambulatory Visit (INDEPENDENT_AMBULATORY_CARE_PROVIDER_SITE_OTHER): Payer: 59 | Admitting: Family Medicine

## 2018-12-17 ENCOUNTER — Other Ambulatory Visit: Payer: Self-pay

## 2018-12-17 VITALS — Wt 266.0 lb

## 2018-12-17 DIAGNOSIS — N492 Inflammatory disorders of scrotum: Secondary | ICD-10-CM

## 2018-12-17 MED ORDER — TRAMADOL HCL 50 MG PO TABS: 50.0000 mg | ORAL_TABLET | Freq: Three times a day (TID) | ORAL | 0 refills | Status: AC | PRN

## 2018-12-17 NOTE — Progress Notes (Signed)
   Subjective:    Patient ID: [name], male    DOB: [birth year], 63 y.o.   MRN: [id number]  HPI Documentation for virtual telephone encounter.  Documentation for virtual audio and video telecommunications through WebEx encounter: The patient was located at home. The provider was located in the office. The patient did consent to this visit and is aware of possible charges through their insurance for this visit. The ther persons participating in this telemedicine service were none. Time spent on call was 5 minutes and in review of previous records >10 minutes total.  This virtual service is not related to other E/M service within previous 7 days. He was seen recently and treated for a scrotal cellulitis with doxycycline.  He did not respond well to this and was switched on June 15 to Augmentin.  He states that he is still having a great deal of scrotal pain unrelieved with ibuprofen, Tylenol as well as triple antibiotic.    Review of Systems     Objective:   Physical Exam Alert and in no distress otherwise not examined       Assessment & Plan:   Encounter Diagnosis  Name Primary?  . Cellulitis of scrotum Yes  He is to continue on Augmentin as he has not been on it long enough to really assess whether it is going to be effective.  He will call me on Friday if no improvement.  I did explain that if the symptoms get worse we might need to have him go to the emergency room or be seen by urology.  I will give him tramadol to help with pain.  Also recommend cool compresses to the area.

## 2019-01-09 ENCOUNTER — Other Ambulatory Visit: Payer: Self-pay | Admitting: Family Medicine

## 2019-01-12 NOTE — Telephone Encounter (Signed)
[facility] is requesting to fill pt pristiq. Please advise [facility]

## 2019-01-22 ENCOUNTER — Ambulatory Visit: Payer: 59 | Admitting: Podiatry

## 2019-01-22 ENCOUNTER — Other Ambulatory Visit: Payer: Self-pay

## 2019-01-22 ENCOUNTER — Encounter: Payer: Self-pay | Admitting: Podiatry

## 2019-01-22 VITALS — Temp 98.2°F

## 2019-01-22 DIAGNOSIS — S90412A Abrasion, left great toe, initial encounter: Secondary | ICD-10-CM | POA: Diagnosis not present

## 2019-01-22 NOTE — Progress Notes (Signed)
He presents today for a laceration to the plantar aspect of the hallux states that he stepped on a dog toy about 5 days ago he states that it cut it was bleeding like crazy and he put a Band-Aid on it he states other than that he has not done anything to it.  Objective: Vital signs are stable he is alert and oriented x3.  There is no erythema edema cellulitis drainage odor appears to be healing very nicely.  Assessment: Well-healing laceration.  Plan: Follow-up with me on as-needed basis.

## 2019-01-23 ENCOUNTER — Other Ambulatory Visit: Payer: Self-pay | Admitting: Family Medicine

## 2019-02-13 ENCOUNTER — Other Ambulatory Visit: Payer: Self-pay | Admitting: Family Medicine

## 2019-03-10 ENCOUNTER — Other Ambulatory Visit: Payer: 59

## 2019-03-11 ENCOUNTER — Other Ambulatory Visit (INDEPENDENT_AMBULATORY_CARE_PROVIDER_SITE_OTHER): Payer: 59

## 2019-03-11 ENCOUNTER — Other Ambulatory Visit: Payer: Self-pay

## 2019-03-11 DIAGNOSIS — Z23 Encounter for immunization: Secondary | ICD-10-CM

## 2019-03-19 ENCOUNTER — Encounter: Payer: Self-pay | Admitting: Family Medicine

## 2019-03-19 ENCOUNTER — Ambulatory Visit: Payer: 59 | Admitting: Podiatry

## 2019-03-27 ENCOUNTER — Other Ambulatory Visit: Payer: Self-pay | Admitting: Family Medicine

## 2019-03-30 NOTE — Telephone Encounter (Signed)
[facility] is requesting to fill pt welbutrin. Please advise [facility]

## 2019-04-07 ENCOUNTER — Ambulatory Visit: Payer: 59 | Admitting: Podiatry

## 2019-04-16 ENCOUNTER — Ambulatory Visit: Payer: 59 | Admitting: Podiatry

## 2019-04-16 ENCOUNTER — Other Ambulatory Visit: Payer: Self-pay

## 2019-04-20 ENCOUNTER — Other Ambulatory Visit: Payer: Self-pay | Admitting: Family Medicine

## 2019-04-20 DIAGNOSIS — G4733 Obstructive sleep apnea (adult) (pediatric): Secondary | ICD-10-CM

## 2019-04-20 NOTE — Telephone Encounter (Signed)
[facility]. Looks as if it was D/C . Please advise [facility]

## 2019-04-27 ENCOUNTER — Other Ambulatory Visit: Payer: Self-pay

## 2019-04-27 ENCOUNTER — Telehealth: Payer: Self-pay

## 2019-04-27 NOTE — Telephone Encounter (Signed)
Called pt and LVM advising it is time for CPE. [name]

## 2019-04-30 ENCOUNTER — Encounter: Payer: Self-pay | Admitting: Podiatry

## 2019-04-30 ENCOUNTER — Ambulatory Visit: Payer: 59 | Admitting: Podiatry

## 2019-04-30 ENCOUNTER — Other Ambulatory Visit: Payer: Self-pay

## 2019-04-30 DIAGNOSIS — M216X2 Other acquired deformities of left foot: Secondary | ICD-10-CM | POA: Diagnosis not present

## 2019-04-30 DIAGNOSIS — M2042 Other hammer toe(s) (acquired), left foot: Secondary | ICD-10-CM | POA: Diagnosis not present

## 2019-04-30 NOTE — Patient Instructions (Signed)
Pre-Operative Instructions  Congratulations, you have decided to take an important step towards improving your quality of life.  You can be assured that the doctors and staff at [facility] will be with you every step of the way.  Here are some important things you should know:  1. Plan to be at the surgery center/hospital at least 1 (one) hour prior to your scheduled time, unless otherwise directed by the surgical center/hospital staff.  You must have a responsible adult accompany you, remain during the surgery and drive you home.  Make sure you have directions to the surgical center/hospital to ensure you arrive on time. 2. If you are having surgery at [facility] or [facility] hospitals, you will need a copy of your medical history and physical form from your family physician within one month prior to the date of surgery. We will give you a form for your primary physician to complete.  3. We make every effort to accommodate the date you request for surgery.  However, there are times where surgery dates or times have to be moved.  We will contact you as soon as possible if a change in schedule is required.   4. No aspirin/ibuprofen for one week before surgery.  If you are on aspirin, any non-steroidal anti-inflammatory medications (Mobic, Aleve, Ibuprofen) should not be taken seven (7) days prior to your surgery.  You make take Tylenol for pain prior to surgery.  5. Medications - If you are taking daily heart and blood pressure medications, seizure, reflux, allergy, asthma, anxiety, pain or diabetes medications, make sure you notify the surgery center/hospital before the day of surgery so they can tell you which medications you should take or avoid the day of surgery. 6. No food or drink after midnight the night before surgery unless directed otherwise by surgical center/hospital staff. 7. No alcoholic beverages 24-hours prior to surgery.  No smoking 24-hours prior or 24-hours after  surgery. 8. Wear loose pants or shorts. They should be loose enough to fit over bandages, boots, and casts. 9. Don't wear slip-on shoes. Sneakers are preferred. 10. Bring your boot with you to the surgery center/hospital.  Also bring crutches or a walker if your physician has prescribed it for you.  If you do not have this equipment, it will be provided for you after surgery. 11. If you have not been contacted by the surgery center/hospital by the day before your surgery, call to confirm the date and time of your surgery. 12. Leave-time from work may vary depending on the type of surgery you have.  Appropriate arrangements should be made prior to surgery with your employer. 13. Prescriptions will be provided immediately following surgery by your doctor.  Fill these as soon as possible after surgery and take the medication as directed. Pain medications will not be refilled on weekends and must be approved by the doctor. 14. Remove nail polish on the operative foot and avoid getting pedicures prior to surgery. 15. Wash the night before surgery.  The night before surgery wash the foot and leg well with water and the antibacterial soap provided. Be sure to pay special attention to beneath the toenails and in between the toes.  Wash for at least three (3) minutes. Rinse thoroughly with water and dry well with a towel.  Perform this wash unless told not to do so by your physician.  Enclosed: 1 Ice pack (please put in freezer the night before surgery)   1 Hibiclens skin cleaner     Pre-op instructions  If you have any questions regarding the instructions, please do not hesitate to call our office.  [place]: [address], [place], [place] [address] -- [phone]  [place]: [address] [place], [place] [address] -- [phone]  [place]: [address]  , [place] [address] -- [phone]   Website: [email or web address] 

## 2019-04-30 NOTE — Progress Notes (Signed)
He presents today for follow-up of capsulitis second metatarsophalangeal joint states that is bothering you so much and ready for surgery.  He denies any change in his past medical history medications allergies states that he is currently working from home and it would be a great benefit to go ahead with his toe down while he is able to not miss work.  I have reviewed his past medical history medications allergies surgeries and social history.  Currently his A1c is a 6.3.  Objective: Vital signs are stable he is alert oriented x3 he has plantarflexed medial displaced valgus rotated second toe left with pain on palpation.  Pulses remain strong and palpable.  No open lesions or wounds.  He has pain on palpation and range of motion of the second toe at the metatarsophalangeal joint.  Assessment: Pain in limb secondary to dislocated plantarflexed elongated second metatarsal and toe.  Plan: Discussed etiology pathology and surgical therapies at this point we consented him today for second metatarsal osteotomy with screw fixation hammertoe repair with screw fixation.  I answered all the questions regarding the procedure the best my billing layman's terms understood was amenable to it signed all 3 pages of the consent form.  Dispensed information regarding the surgery center and dispensed today.  Follow-up with him in the near future.

## 2019-05-04 ENCOUNTER — Telehealth: Payer: Self-pay | Admitting: Podiatry

## 2019-05-04 NOTE — Telephone Encounter (Signed)
I'm calling to cancel my surgery scheduled for 11 December. I asked the pt why he was cancelling and he stated he didn't have the money he would have to pay for the surgery. I told him I would get his surgery cancelled and to contact us if he needed any conservative treatment. Cancelled pt's surgery in the surgery book as well as on Dr. Stephenie Acres schedule in [place]. I've also cancelled the postop visits I've scheduled for the pt.  Called and left a message for Caren Griffins at the surgical center letting her know to cancel the surgery for the pt on their end.

## 2019-05-05 LAB — HM DIABETES EYE EXAM

## 2019-05-26 ENCOUNTER — Ambulatory Visit (INDEPENDENT_AMBULATORY_CARE_PROVIDER_SITE_OTHER): Payer: 59 | Admitting: Otolaryngology

## 2019-05-26 ENCOUNTER — Other Ambulatory Visit: Payer: Self-pay

## 2019-05-26 VITALS — Temp 96.1°F

## 2019-05-26 DIAGNOSIS — H66001 Acute suppurative otitis media without spontaneous rupture of ear drum, right ear: Secondary | ICD-10-CM | POA: Diagnosis not present

## 2019-05-26 DIAGNOSIS — J3089 Other allergic rhinitis: Secondary | ICD-10-CM | POA: Diagnosis not present

## 2019-05-26 NOTE — Progress Notes (Signed)
HPI: [name] is a 63 y.o. male who returns today for evaluation of itching and draining from his right ear.  The draining from the right ear started yesterday.  He is status post right M&T 7 months ago.  He has been getting some water in his ears when he takes a shower. Also his allergies have been acting up and he has been much more congested in his nose.  He uses to use nasal sprays for allergies as well as 2 tablets for allergies. He has had no fever..  Past Medical History:  Diagnosis Date  . Anxiety   . Arthritis    back, shoulders, left great toe  . Candidiasis, mouth 03/22/2016  . Carpal tunnel syndrome of right wrist 10/2014  . Dental crowns present   . Depression   . Dyslipidemia   . Ear fullness 08/30/2016  . GERD (gastroesophageal reflux disease)   . Hypertension    under control with med., per pt.; has been on med. x 15-20 yr.  . Insulin dependent diabetes mellitus   . Nummular eczema   . Obesity   . Onychomycosis 08/30/2016  . Recurrent infections 03/22/2016  . Sleep apnea    uses CPAP nightly   Past Surgical History:  Procedure Laterality Date  . CARDIAC CATHETERIZATION  12/30/2006   "mod. pulmonary HTN with preserved cardiac output and cardiac index"  . CARPAL TUNNEL RELEASE Right 10/14/2014   Procedure: RIGHT CARPAL TUNNEL RELEASE;  Surgeon: Leanora Cover, MD;  Location: [facility];  Service: Orthopedics;  Laterality: Right;  . CARPAL TUNNEL RELEASE Left 05/19/2015   Procedure: LEFT CARPAL TUNNEL RELEASE;  Surgeon: Leanora Cover, MD;  Location: [facility];  Service: Orthopedics;  Laterality: Left;  . CYST EXCISION     scalp and back  . ESOPHAGOGASTRODUODENOSCOPY  03/25/2007  . FOOT SURGERY Left    revision traumatic amputation of foot  . ROUX-EN-Y PROCEDURE  07/28/2007   with lysis of adhesions   Social History   Socioeconomic History  . Marital status: Single    Spouse name: Not on file  . Number of children: Not on file  .  Years of education: Not on file  . Highest education level: Not on file  Occupational History  . Not on file  Social Needs  . Financial resource strain: Not on file  . Food insecurity    Worry: Not on file    Inability: Not on file  . Transportation needs    Medical: Not on file    Non-medical: Not on file  Tobacco Use  . Smoking status: Former Smoker    Quit date: 07/03/2007    Years since quitting: 11.9  . Smokeless tobacco: Never Used  Substance and Sexual Activity  . Alcohol use: Yes    Comment: seldom  . Drug use: No  . Sexual activity: Not Currently  Lifestyle  . Physical activity    Days per week: Not on file    Minutes per session: Not on file  . Stress: Not on file  Relationships  . Social Herbalist on phone: Not on file    Gets together: Not on file    Attends religious service: Not on file    Active member of club or organization: Not on file    Attends meetings of clubs or organizations: Not on file    Relationship status: Not on file  Other Topics Concern  . Not on file  Social  History Narrative  . Not on file   Family History  Problem Relation Age of Onset  . Heart disease Mother   . Cancer Mother 29       Multiple myeloma  . Diabetes Father   . Diabetes Brother   . Heart disease Brother 60       cardiomyopathy  . Heart disease Brother 20       MI  . Arthritis Paternal Grandmother   . Diabetes Paternal Grandmother   . Heart disease Paternal Grandmother   . Stroke Paternal Grandmother    Allergies  Allergen Reactions  . Latex Itching   Prior to Admission medications   Medication Sig Start Date End Date Taking? Authorizing Provider  ASPIRIN LOW DOSE 81 MG EC tablet TAKE 1 TABLET BY MOUTH AT  BEDTIME 03/30/19   Denita Lung, MD  azelastine (ASTELIN) 0.1 % nasal spray USE 1 SPRAY IN EACH NOSTRIL AT BEDTIME 11/17/18   Denita Lung, MD  B Complex-C (B-COMPLEX WITH VITAMIN C) tablet Take 1 tablet by mouth 2 (two) times daily.     [provider]  Blood Glucose Monitoring Suppl (ONETOUCH VERIO IQ SYSTEM) W/DEVICE KIT 1 each by Does not apply route daily. 02/05/14   Denita Lung, MD  buPROPion (WELLBUTRIN SR) 200 MG 12 hr tablet TAKE 1 TABLET BY MOUTH  TWICE DAILY 03/31/19   Denita Lung, MD  calcium-vitamin D 250-100 MG-UNIT per tablet Take 1 tablet by mouth 2 (two) times daily.      [provider]  cetirizine (ZYRTEC) 10 MG tablet Take 10 mg by mouth daily.      [provider]  clobetasol (TEMOVATE) 0.05 % GEL Apply topically 2 (two) times daily.    [provider]  desvenlafaxine (PRISTIQ) 50 MG 24 hr tablet TAKE 1 TABLET BY MOUTH  DAILY 01/12/19   Denita Lung, MD  empagliflozin (JARDIANCE) 25 MG TABS tablet Take 25 mg by mouth daily.    [provider]  fluticasone (FLONASE) 50 MCG/ACT nasal spray USE 1 SPRAY NASALLY ONCE A  DAY FOR ALLERGIES 10/20/18   Denita Lung, MD  glucose blood test strip USE TO CHECK SUGAR 4 TIMES  DAILY. 11/18/18   [provider]  hydrocortisone 2.5 % cream Apply topically 2 (two) times daily.    [provider]  Insulin Glargine [facility]) 100 UNIT/ML SOPN  01/16/17   [provider]  Insulin Pen Needle (FIFTY50 PEN NEEDLES) 32G X 4 MM MISC Use to inject insulin once a day or as directed 07/23/17   [provider]  Insulin Syringe-Needle U-100 (INSULIN SYRINGE 1CC/31GX5/16") 31G X 5/16" 1 ML MISC Use as directed once daily ( Dx code E 11.65) 08/30/16   [provider]  lisinopril (PRINIVIL,ZESTRIL) 10 MG tablet Take 10 mg by mouth daily.      [provider]  metFORMIN (GLUCOPHAGE) 1000 MG tablet Take 1,000 mg by mouth 2 (two) times daily with a meal.      [provider]  montelukast (SINGULAIR) 10 MG tablet  08/15/17   [provider]  Multiple Vitamin (MULTI-VITAMINS) TABS Take 1 tablet twice a day    [provider]  omeprazole (PRILOSEC) 40 MG capsule  TAKE 1 CAPSULE BY MOUTH  DAILY 10/06/18   Denita Lung, MD  [facility] LANCETS [address] [place]  08/15/15   [provider]  [facility] VERIO test strip  08/15/15   [provider]  rosuvastatin (CRESTOR) 5 MG tablet TAKE 1 TABLET BY MOUTH  DAILY 01/26/19   Denita Lung, MD  traZODone (DESYREL) 50 MG tablet TAKE 1/2 TO 1 TABLET BY MOUTH AT BEDTIME AS NEEDED FOR SLEEP 04/20/19   Denita Lung, MD     Positive ROS: Otherwise negative  All other systems have been reviewed and were otherwise negative with the exception of those mentioned in the HPI and as above.  Physical Exam: Constitutional: Alert, well-appearing, no acute distress Ears: External ears without lesions or tenderness.  Left ear canal and TM are clear.  Right ear reveals a purulent discharge.  He has an intact Paparella type I tube in the right ear with purulent discharge from the myringotomy tube.  This was cleaned and suction in the office and I insufflated Ciprodex drops into the middle ear space. Nasal: External nose without lesions. Septum slight septal deviation to the right..  Moderate mucosal swelling and edema with a fair amount of thick mucus discharge. Oral: Lips and gums without lesions. Tongue and palate mucosa without lesions. Posterior oropharynx clear. Neck: No palpable adenopathy or masses Respiratory: Breathing comfortably  Skin: No facial/neck lesions or rash noted.  Procedures  Assessment: Acute right otitis media with drainage from myringotomy tube Allergic rhinitis questionable sinus disease  Plan: He has antibiotic eardrops for his ear recommended using 4 to 5 drops twice daily for the next week. Also prescribed Augmentin 875 mg twice daily.Marland Kitchen  He last used eardrops in the right ear in August. He will follow-up as needed.   Radene Journey, MD

## 2019-06-04 ENCOUNTER — Encounter (INDEPENDENT_AMBULATORY_CARE_PROVIDER_SITE_OTHER): Payer: Self-pay | Admitting: Otolaryngology

## 2019-06-04 ENCOUNTER — Other Ambulatory Visit: Payer: Self-pay

## 2019-06-04 ENCOUNTER — Ambulatory Visit (INDEPENDENT_AMBULATORY_CARE_PROVIDER_SITE_OTHER): Payer: 59 | Admitting: Otolaryngology

## 2019-06-04 VITALS — Temp 98.1°F

## 2019-06-04 DIAGNOSIS — H6501 Acute serous otitis media, right ear: Secondary | ICD-10-CM | POA: Diagnosis not present

## 2019-06-04 MED ORDER — AMOXICILLIN-POT CLAVULANATE 875-125 MG PO TABS: 1.0000 | ORAL_TABLET | Freq: Two times a day (BID) | ORAL | 0 refills | Status: DC

## 2019-06-04 NOTE — Progress Notes (Signed)
HPI: [name] is a 63 y.o. male who returns today for evaluation of drainage from the right ear.  He is status post right M&T in May of this year.  He had done well but started having some drainage about a week ago.  He has antibiotic eardrops which I prescribed and has been using these but he is still having a little bit of drainage from the right ear.  Has mild discomfort.  No fever..  Past Medical History:  Diagnosis Date  . Anxiety   . Arthritis    back, shoulders, left great toe  . Candidiasis, mouth 03/22/2016  . Carpal tunnel syndrome of right wrist 10/2014  . Dental crowns present   . Depression   . Dyslipidemia   . Ear fullness 08/30/2016  . GERD (gastroesophageal reflux disease)   . Hypertension    under control with med., per pt.; has been on med. x 15-20 yr.  . Insulin dependent diabetes mellitus   . Nummular eczema   . Obesity   . Onychomycosis 08/30/2016  . Recurrent infections 03/22/2016  . Sleep apnea    uses CPAP nightly   Past Surgical History:  Procedure Laterality Date  . CARDIAC CATHETERIZATION  12/30/2006   "mod. pulmonary HTN with preserved cardiac output and cardiac index"  . CARPAL TUNNEL RELEASE Right 10/14/2014   Procedure: RIGHT CARPAL TUNNEL RELEASE;  Surgeon: Leanora Cover, MD;  Location: [facility];  Service: Orthopedics;  Laterality: Right;  . CARPAL TUNNEL RELEASE Left 05/19/2015   Procedure: LEFT CARPAL TUNNEL RELEASE;  Surgeon: Leanora Cover, MD;  Location: [facility];  Service: Orthopedics;  Laterality: Left;  . CYST EXCISION     scalp and back  . ESOPHAGOGASTRODUODENOSCOPY  03/25/2007  . FOOT SURGERY Left    revision traumatic amputation of foot  . ROUX-EN-Y PROCEDURE  07/28/2007   with lysis of adhesions   Social History   Socioeconomic History  . Marital status: Single    Spouse name: Not on file  . Number of children: Not on file  . Years of education: Not on file  . Highest education level: Not on file   Occupational History  . Not on file  Social Needs  . Financial resource strain: Not on file  . Food insecurity    Worry: Not on file    Inability: Not on file  . Transportation needs    Medical: Not on file    Non-medical: Not on file  Tobacco Use  . Smoking status: Former Smoker    Packs/day: 1.00    Years: 33.00    Pack years: 33.00    Types: Cigarettes    Start date: 74    Quit date: 07/03/2007    Years since quitting: 11.9  . Smokeless tobacco: Never Used  Substance and Sexual Activity  . Alcohol use: Yes    Comment: seldom  . Drug use: No  . Sexual activity: Not Currently  Lifestyle  . Physical activity    Days per week: Not on file    Minutes per session: Not on file  . Stress: Not on file  Relationships  . Social Herbalist on phone: Not on file    Gets together: Not on file    Attends religious service: Not on file    Active member of club or organization: Not on file    Attends meetings of clubs or organizations: Not on file    Relationship status: Not  on file  Other Topics Concern  . Not on file  Social History Narrative  . Not on file   Family History  Problem Relation Age of Onset  . Heart disease Mother   . Cancer Mother 41       Multiple myeloma  . Diabetes Father   . Diabetes Brother   . Heart disease Brother 33       cardiomyopathy  . Heart disease Brother 72       MI  . Arthritis Paternal Grandmother   . Diabetes Paternal Grandmother   . Heart disease Paternal Grandmother   . Stroke Paternal Grandmother    Allergies  Allergen Reactions  . Latex Itching   Prior to Admission medications   Medication Sig Start Date End Date Taking? Authorizing Provider  ASPIRIN LOW DOSE 81 MG EC tablet TAKE 1 TABLET BY MOUTH AT  BEDTIME 03/30/19  Yes Denita Lung, MD  azelastine (ASTELIN) 0.1 % nasal spray USE 1 SPRAY IN EACH NOSTRIL AT BEDTIME 11/17/18  Yes Denita Lung, MD  B Complex-C (B-COMPLEX WITH VITAMIN C) tablet Take 1 tablet by  mouth 2 (two) times daily.   Yes [provider]  Blood Glucose Monitoring Suppl (ONETOUCH VERIO IQ SYSTEM) W/DEVICE KIT 1 each by Does not apply route daily. 02/05/14  Yes Denita Lung, MD  buPROPion (WELLBUTRIN SR) 200 MG 12 hr tablet TAKE 1 TABLET BY MOUTH  TWICE DAILY 03/31/19  Yes Denita Lung, MD  calcium-vitamin D 250-100 MG-UNIT per tablet Take 1 tablet by mouth 2 (two) times daily.     Yes [provider]  cetirizine (ZYRTEC) 10 MG tablet Take 10 mg by mouth daily.     Yes [provider]  clobetasol (TEMOVATE) 0.05 % GEL Apply topically 2 (two) times daily.   Yes [provider]  desvenlafaxine (PRISTIQ) 50 MG 24 hr tablet TAKE 1 TABLET BY MOUTH  DAILY 01/12/19  Yes Denita Lung, MD  empagliflozin (JARDIANCE) 25 MG TABS tablet Take 25 mg by mouth daily.   Yes [provider]  fluticasone (FLONASE) 50 MCG/ACT nasal spray USE 1 SPRAY NASALLY ONCE A  DAY FOR ALLERGIES 10/20/18  Yes Denita Lung, MD  glucose blood test strip USE TO CHECK SUGAR 4 TIMES  DAILY. 11/18/18  Yes [provider]  hydrocortisone 2.5 % cream Apply topically 2 (two) times daily.   Yes [provider]  Insulin Glargine (BASAGLAR KWIKPEN) 100 UNIT/ML SOPN  01/16/17  Yes [provider]  Insulin Pen Needle (FIFTY50 PEN NEEDLES) 32G X 4 MM MISC Use to inject insulin once a day or as directed 07/23/17  Yes [provider]  Insulin Syringe-Needle U-100 (INSULIN SYRINGE 1CC/31GX5/16") 31G X 5/16" 1 ML MISC Use as directed once daily ( Dx code E 11.65) 08/30/16  Yes [provider]  lisinopril (PRINIVIL,ZESTRIL) 10 MG tablet Take 10 mg by mouth daily.     Yes [provider]  metFORMIN (GLUCOPHAGE) 1000 MG tablet Take 1,000 mg by mouth 2 (two) times daily with a meal.     Yes [provider]  montelukast (SINGULAIR) 10 MG tablet  08/15/17  Yes [provider]  Multiple Vitamin (MULTI-VITAMINS) TABS Take 1 tablet  twice a day   Yes [provider]  omeprazole (PRILOSEC) 40 MG capsule TAKE 1 CAPSULE BY MOUTH  DAILY 10/06/18  Yes Denita Lung, MD  [facility] [address]ICA LANCETS [address] [place]  08/15/15  Yes [provider]  ONETOUCH VERIO test strip  08/15/15  Yes [provider]  rosuvastatin (CRESTOR) 5 MG tablet TAKE 1 TABLET BY MOUTH  DAILY 01/26/19  Yes Denita Lung, MD  traZODone (DESYREL) 50 MG tablet TAKE 1/2 TO 1 TABLET BY MOUTH AT BEDTIME AS NEEDED FOR SLEEP 04/20/19  Yes Denita Lung, MD     Positive ROS: Otherwise negative  All other systems have been reviewed and were otherwise negative with the exception of those mentioned in the HPI and as above.  Physical Exam: Constitutional: Alert, well-appearing, no acute distress Ears: External ears without lesions or tenderness.  Left ear canal and TM are clear.  Right TM has a Paparella type I tube in place with some purulent discharge.  This was suction and I applied Ciprodex and insufflated this into the middle ear space along with CSF powder. Nasal: External nose without lesions. Septum mild deviation. Clear nasal passages Oral: Lips and gums without lesions. Tongue and palate mucosa without lesions. Posterior oropharynx clear. Neck: No palpable adenopathy or masses Respiratory: Breathing comfortably  Skin: No facial/neck lesions or rash noted.  Procedures  Assessment: Right otitis media with patent myringotomy tube  Plan: He will continue with the eardrops twice a day. Also prescribed Augmentin 875 mg twice daily for a week. He will notify us if he is having any persistent drainage next week.   Radene Journey, MD

## 2019-06-15 ENCOUNTER — Telehealth (INDEPENDENT_AMBULATORY_CARE_PROVIDER_SITE_OTHER): Payer: Self-pay | Admitting: Otolaryngology

## 2019-06-18 ENCOUNTER — Encounter: Payer: 59 | Admitting: Podiatry

## 2019-06-23 ENCOUNTER — Other Ambulatory Visit: Payer: Self-pay | Admitting: Family Medicine

## 2019-06-23 DIAGNOSIS — G4733 Obstructive sleep apnea (adult) (pediatric): Secondary | ICD-10-CM

## 2019-06-23 NOTE — Telephone Encounter (Signed)
[facility] please advise [facility] 

## 2019-06-26 ENCOUNTER — Other Ambulatory Visit: Payer: Self-pay | Admitting: Family Medicine

## 2019-06-29 NOTE — Telephone Encounter (Signed)
[facility] is requesting to fill pt pristiq. Please advise [facility]

## 2019-06-30 ENCOUNTER — Encounter: Payer: 59 | Admitting: Podiatry

## 2019-07-14 ENCOUNTER — Encounter: Payer: 59 | Admitting: Podiatry

## 2019-07-18 ENCOUNTER — Other Ambulatory Visit: Payer: Self-pay | Admitting: Family Medicine

## 2019-07-28 ENCOUNTER — Encounter: Payer: 59 | Admitting: Podiatry

## 2019-07-30 ENCOUNTER — Telehealth: Payer: Self-pay | Admitting: Family Medicine

## 2019-07-30 NOTE — Telephone Encounter (Signed)
Spoke with Kieren about his needed W2 for the patient assistance program.  He uploaded to a special email that [employer] provided for him.  Status:  Await approval

## 2019-08-03 ENCOUNTER — Other Ambulatory Visit: Payer: Self-pay

## 2019-08-03 ENCOUNTER — Telehealth: Payer: Self-pay | Admitting: Family Medicine

## 2019-08-03 MED ORDER — ASPIRIN 81 MG PO TBEC: 81.0000 mg | DELAYED_RELEASE_TABLET | Freq: Every day | ORAL | 0 refills | Status: DC

## 2019-08-03 NOTE — Telephone Encounter (Signed)
Done KH 

## 2019-08-03 NOTE — Telephone Encounter (Signed)
  He received notice of approval for assistance on Pristiq and needs rx sent to    [facility] [address]

## 2019-08-03 NOTE — Telephone Encounter (Signed)
Received fax from [facility] stating that the pt. Needs a refill on his Aspirin last apt. Was 6/174/20.

## 2019-08-04 ENCOUNTER — Telehealth: Payer: Self-pay | Admitting: Family Medicine

## 2019-08-04 NOTE — Telephone Encounter (Signed)
[employer] approved Patient assistance for Pristiq 90 until Jul 01, 2020. Medication will be shipped to our office. Called patient to advise same.

## 2019-08-11 ENCOUNTER — Telehealth: Payer: Self-pay | Admitting: Family Medicine

## 2019-08-11 NOTE — Telephone Encounter (Signed)
Called pt advised we rcd Pristig 50 mg #90.  He will pick up at lunch.

## 2019-10-19 ENCOUNTER — Other Ambulatory Visit: Payer: Self-pay | Admitting: Family Medicine

## 2019-10-20 ENCOUNTER — Telehealth: Payer: Self-pay

## 2019-10-20 NOTE — Telephone Encounter (Signed)
Pt was called to get an med check or CPE scheduled. LVM for pt to call back [facility]

## 2019-11-02 ENCOUNTER — Ambulatory Visit: Payer: 59 | Admitting: Family Medicine

## 2019-11-02 ENCOUNTER — Encounter: Payer: Self-pay | Admitting: Family Medicine

## 2019-11-02 ENCOUNTER — Other Ambulatory Visit: Payer: Self-pay

## 2019-11-02 VITALS — BP 116/78 | HR 70 | Temp 97.5°F | Wt 264.2 lb

## 2019-11-02 DIAGNOSIS — B370 Candidal stomatitis: Secondary | ICD-10-CM | POA: Diagnosis not present

## 2019-11-02 DIAGNOSIS — G4733 Obstructive sleep apnea (adult) (pediatric): Secondary | ICD-10-CM

## 2019-11-02 DIAGNOSIS — R252 Cramp and spasm: Secondary | ICD-10-CM | POA: Diagnosis not present

## 2019-11-02 NOTE — Progress Notes (Signed)
   Subjective:    Patient ID: [name], male    DOB: Jun 18, 1956, 64 y.o.   MRN: [id number]  HPI He notes that Thursday after a light workout he developed some leg cramps and to lesser extent in his hands.  It tended to come and go.  He also associated this with malaise and a squeamish stomach.  He also has OSA and is on a CPAP but says it is not working properly.  He continues have difficulty with thrush.  He did have a cyst in the gluteal area that apparently is healing but he would like me to look at it.  He continues to complain of candidiasis in the mouth.   Review of Systems     Objective:   Physical Exam Alert and in no distress. Tympanic membrane on the right does have a tube present, left is normal, canals are normal. Pharyngeal area is normal. Neck is supple without adenopathy or thyromegaly. Cardiac exam shows a regular sinus rhythm without murmurs or gallops. Lungs are clear to auscultation. Exam of the gluteal area shows no lesions.       Assessment & Plan:  Muscle cramps  Obstructive sleep apnea  Candidiasis, mouth  Keep yourself well-hydrated to help with the cramping and also use Tylenol for the cramping if you need extra help then you can go to Advil or Aleve added on to the Tylenol Call the company that has her CPAP and let them know that you are still not where you need to be. No particular therapy for the kind analysis. The gluteal area also looks good.

## 2019-11-02 NOTE — Patient Instructions (Signed)
Keep yourself well-hydrated to help with the cramping and also use Tylenol for the cramping if you need extra help then you can go to Advil or Aleve added on to the Tylenol Call the company that has her CPAP and let them know that you are still not where you need to be.

## 2019-11-04 ENCOUNTER — Telehealth: Payer: Self-pay | Admitting: Family Medicine

## 2019-11-04 NOTE — Telephone Encounter (Signed)
I called pt. LM for him to pick note up.

## 2019-11-04 NOTE — Telephone Encounter (Signed)
Pt called and states that he would like to return to gym to workout. His instructed is requesting a doctor's note advising of that is ok and what restrictions he needs. Please call pt and he will pick up when ready.

## 2019-11-04 NOTE — Telephone Encounter (Signed)
I wrote him a note

## 2019-11-18 LAB — COMPREHENSIVE METABOLIC PANEL: Albumin: 4.1 (ref 3.5–5.0)

## 2019-12-02 ENCOUNTER — Telehealth: Payer: Self-pay

## 2019-12-02 MED ORDER — DESVENLAFAXINE SUCCINATE ER 50 MG PO TB24: 50.0000 mg | ORAL_TABLET | Freq: Every day | ORAL | 3 refills | Status: DC

## 2019-12-02 NOTE — Telephone Encounter (Signed)
Pt. Called stating that he needs a refill on his Pristiq called in as soon as possible because he waited to the last minute to request a refill and it takes a little while to receive from the pharmacy. Pt. Last apt was 11/02/19.

## 2019-12-02 NOTE — Telephone Encounter (Signed)
done

## 2019-12-08 ENCOUNTER — Telehealth: Payer: Self-pay | Admitting: Family Medicine

## 2019-12-08 NOTE — Telephone Encounter (Signed)
Pt called needs refill of Prestiq, he is completely out and will call his insurance to see if they can help while we wait on his patient assistance to come in.  Called [employer] to order refill of Prestiq the refill id [phone] it will take 7 - 10 days to come in.  Next time we can order is 02/14/20  Called pt advised of same.

## 2019-12-09 ENCOUNTER — Telehealth: Payer: Self-pay

## 2019-12-09 NOTE — Telephone Encounter (Signed)
Pt is requesting to fill his pristiq for ten days . Please advise if this is ok. KH

## 2019-12-09 NOTE — Telephone Encounter (Signed)
It was called in several days ago

## 2019-12-10 ENCOUNTER — Other Ambulatory Visit: Payer: Self-pay

## 2019-12-10 MED ORDER — DESVENLAFAXINE SUCCINATE ER 50 MG PO TB24: 50.0000 mg | ORAL_TABLET | Freq: Every day | ORAL | 0 refills | Status: DC

## 2019-12-10 NOTE — Telephone Encounter (Signed)
He only wants a ten day supply so a new script is needed. KH

## 2019-12-10 NOTE — Addendum Note (Signed)
Addended by: Ronnald Nian on: 12/10/2019 03:06 PM   Modules accepted: Orders

## 2019-12-12 ENCOUNTER — Other Ambulatory Visit: Payer: Self-pay | Admitting: Family Medicine

## 2019-12-15 ENCOUNTER — Ambulatory Visit: Payer: 59 | Admitting: Podiatry

## 2019-12-22 ENCOUNTER — Other Ambulatory Visit: Payer: Self-pay

## 2019-12-22 ENCOUNTER — Ambulatory Visit: Payer: 59 | Admitting: Podiatry

## 2019-12-22 ENCOUNTER — Ambulatory Visit (INDEPENDENT_AMBULATORY_CARE_PROVIDER_SITE_OTHER): Payer: 59

## 2019-12-22 ENCOUNTER — Encounter: Payer: Self-pay | Admitting: Podiatry

## 2019-12-22 ENCOUNTER — Other Ambulatory Visit: Payer: Self-pay | Admitting: Podiatry

## 2019-12-22 DIAGNOSIS — M216X2 Other acquired deformities of left foot: Secondary | ICD-10-CM

## 2019-12-22 DIAGNOSIS — M722 Plantar fascial fibromatosis: Secondary | ICD-10-CM

## 2019-12-22 DIAGNOSIS — M2042 Other hammer toe(s) (acquired), left foot: Secondary | ICD-10-CM

## 2019-12-22 DIAGNOSIS — M76821 Posterior tibial tendinitis, right leg: Secondary | ICD-10-CM

## 2019-12-22 NOTE — Progress Notes (Signed)
He presents today with a chief complaint of a painful right heel.  He is also here for surgical consult regarding the second metatarsophalangeal joint and hammertoe deformity second toe on the left foot.  We have already discussed with him in great detail surgery but he would like to reiterated and go over it 1 more time.  He would also like an injection to his right heel.  Objective: Vital signs are stable alert oriented x3 there is mild edema no erythema cellulitis drainage or odor at the level of the second metatarsophalangeal joint with dorsiflexion of the toe at the level of the metatarsophalangeal joint and medial deviation of that toe toward the hallux.  It is tender on palpation and attempted repositioning.  He also has pain on palpation medial calcaneal tubercle of the right heel.  Both feet are strongly palpable with pulses.  Capillary fill time is immediate no open lesions or wounds.  Assessment: Pain in limb secondary to plantarflexed dislocated second metatarsophalangeal joint hammertoe deformity second left.  Pain in limb secondary to plantar fasciitis right.  Plan: Injected the right heel today with 20 mg Kenalog 5 mg Marcaine point of maximal tenderness of the right heel.  He tolerated procedure well without complications.  He went over consent form today line by line number by number giving him ample time to ask when she is off it regarding a second metatarsal osteotomy with screw fixation as well as a hammertoe repair with screw fixation and pinning.  He understands this is amenable to it we will follow-up with me in the near future for surgical intervention.

## 2019-12-24 ENCOUNTER — Telehealth: Payer: Self-pay

## 2019-12-24 NOTE — Telephone Encounter (Signed)
DOS 01/08/20  METATARSAL OSTEOTOMY 2ND LT - 28308 HAMMERTOE REPAIR 2ND LT - 28285  [employer] EFFECTIVE DATE - 07/03/2019  PLAN DEDUCTIBLE - $500.00 W/ $500.00 REMAINING OUT OF POCKET - $5000.00 W/ $3933.08 REMAINING COPAY $150.00 COINSURANCE - 20%    NOTIFICATION/PRIOR AUTHORIZATION NUMBER CASE STATUS CASE STATUS REASON PRIMARY CARE PHYSICIAN [id number] Closed Case Was Managed And Is Now Complete - ADVANCE NOTIFY DATE/TIME ADMISSION NOTIFY DATE/TIME 12/23/2019 12:52 PM CDT - COVERAGE STATUS OVERALL COVERAGE STATUS Covered/Approved 1-2 CODE DESCRIPTION COVERAGE STATUS DECISION DATE FAC [facility] Coverage determination is reflected for the facility admission and is not a guarantee of payment for ongoing services. Covered/Approved 12/24/2019 1 28285 Correction, hammertoe (eg, interphalange more Covered/Approved 12/24/2019 2 28308 Osteotomy, with or without lengthening, more Covered/Approved 12/24/2019

## 2019-12-28 ENCOUNTER — Other Ambulatory Visit: Payer: 59 | Admitting: Orthotics

## 2019-12-31 ENCOUNTER — Other Ambulatory Visit: Payer: 59 | Admitting: Orthotics

## 2020-01-07 ENCOUNTER — Other Ambulatory Visit: Payer: Self-pay | Admitting: Podiatry

## 2020-01-07 MED ORDER — ONDANSETRON HCL 4 MG PO TABS: 4.0000 mg | ORAL_TABLET | Freq: Three times a day (TID) | ORAL | 0 refills | Status: DC | PRN

## 2020-01-07 MED ORDER — CEPHALEXIN 500 MG PO CAPS: 500.0000 mg | ORAL_CAPSULE | Freq: Three times a day (TID) | ORAL | 0 refills | Status: DC

## 2020-01-07 MED ORDER — OXYCODONE-ACETAMINOPHEN 10-325 MG PO TABS: 1.0000 | ORAL_TABLET | Freq: Three times a day (TID) | ORAL | 0 refills | Status: AC | PRN

## 2020-01-08 DIAGNOSIS — M21542 Acquired clubfoot, left foot: Secondary | ICD-10-CM | POA: Diagnosis not present

## 2020-01-08 DIAGNOSIS — M2042 Other hammer toe(s) (acquired), left foot: Secondary | ICD-10-CM | POA: Diagnosis not present

## 2020-01-14 ENCOUNTER — Other Ambulatory Visit: Payer: Self-pay

## 2020-01-14 ENCOUNTER — Ambulatory Visit (INDEPENDENT_AMBULATORY_CARE_PROVIDER_SITE_OTHER): Payer: 59 | Admitting: Podiatry

## 2020-01-14 ENCOUNTER — Encounter: Payer: Self-pay | Admitting: Podiatry

## 2020-01-14 ENCOUNTER — Ambulatory Visit (INDEPENDENT_AMBULATORY_CARE_PROVIDER_SITE_OTHER): Payer: 59

## 2020-01-14 DIAGNOSIS — M2042 Other hammer toe(s) (acquired), left foot: Secondary | ICD-10-CM

## 2020-01-14 DIAGNOSIS — M216X2 Other acquired deformities of left foot: Secondary | ICD-10-CM

## 2020-01-14 DIAGNOSIS — Z9889 Other specified postprocedural states: Secondary | ICD-10-CM

## 2020-01-14 NOTE — Progress Notes (Signed)
He presents today for follow-up of his surgery this is his first postop visit date of surgery January 08, 2020 sec metatarsal osteotomy with hammertoe repair and pin fixation second digit left.  He denies fever chills nausea vomiting muscle aches and pain states that it has been painful and he has had to take some of the pain medication.  Objective: Vital signs are stable alert oriented x3 presents today with his cam walker and his cane.  I was removed demonstrates dry sterile dressing intact was removed demonstrates moderate edema no erythema cellulitis drainage or odor sutures are intact margins well coapted.  Radiograph does demonstrate a second metatarsal osteotomy with double screw fixation and a hammertoe repair with pin fixation.  Assessment: Well-healing surgical foot x1 week.  Plan: Redressed today dressed a compressive dressing like to follow-up with him in 1 week at which time we hope to be able to remove some of the sutures and I did go ahead and transition him today to a Darco shoe.

## 2020-01-21 ENCOUNTER — Other Ambulatory Visit: Payer: Self-pay

## 2020-01-21 ENCOUNTER — Ambulatory Visit (INDEPENDENT_AMBULATORY_CARE_PROVIDER_SITE_OTHER): Payer: 59 | Admitting: Podiatry

## 2020-01-21 ENCOUNTER — Encounter: Payer: Self-pay | Admitting: Podiatry

## 2020-01-21 DIAGNOSIS — M216X2 Other acquired deformities of left foot: Secondary | ICD-10-CM

## 2020-01-21 DIAGNOSIS — M2042 Other hammer toe(s) (acquired), left foot: Secondary | ICD-10-CM

## 2020-01-21 DIAGNOSIS — Z9889 Other specified postprocedural states: Secondary | ICD-10-CM

## 2020-01-21 NOTE — Progress Notes (Signed)
He presents today date of surgery 01/08/2020 status post 2 weeks sec metatarsal osteotomy and hammertoe repair second left with pin.  He states that is been doing pretty well everything seems to be doing better this week.  Objective: Vital signs are stable alert oriented x3.  Dressed her dressing intact was removed demonstrates minimal edema no erythema cellulitis drainage or odor pin appears to be extruded just a few millimeters from previous evaluation.  No erythema edema cellulitis drainage or odor I see some dehiscence at the proximal most aspect of the incision line submental leave the stitches in for another week.  Assessment: Well-healing surgical foot.  Plan: Leave the stitches in for another week redressed today dressed a compressive dressing use compression around the toe in the tip of the toe to hold the pins in place.  I will follow-up with him in 1 week for possible suture removal.

## 2020-01-27 ENCOUNTER — Telehealth: Payer: Self-pay | Admitting: *Deleted

## 2020-01-27 ENCOUNTER — Telehealth: Payer: Self-pay | Admitting: Podiatry

## 2020-01-27 NOTE — Telephone Encounter (Signed)
Hey lisa I have a surgical pt that bandages have came off pt has appt tomorrow does pt need tpo come today please assist

## 2020-01-27 NOTE — Telephone Encounter (Signed)
Patient called and just wanted to let us know that the bandages were coming off and that the patient has re applied the surgery site with band aids and the patient stated that he was in no pain and was not taken any pain medicines and there is not any draining or swelling and was doing pretty good and was elevating the foot and I stated to call the office if any concerns or questions and that we would see the patient tomorrow for a post op visit with Dr Al Corpus. Misty Stanley

## 2020-01-28 ENCOUNTER — Encounter: Payer: Self-pay | Admitting: Podiatry

## 2020-01-28 ENCOUNTER — Ambulatory Visit (INDEPENDENT_AMBULATORY_CARE_PROVIDER_SITE_OTHER): Payer: 59 | Admitting: Podiatry

## 2020-01-28 ENCOUNTER — Other Ambulatory Visit: Payer: Self-pay

## 2020-01-28 DIAGNOSIS — M2042 Other hammer toe(s) (acquired), left foot: Secondary | ICD-10-CM

## 2020-01-28 DIAGNOSIS — Z9889 Other specified postprocedural states: Secondary | ICD-10-CM

## 2020-01-28 DIAGNOSIS — M216X2 Other acquired deformities of left foot: Secondary | ICD-10-CM

## 2020-01-30 NOTE — Progress Notes (Signed)
He presents today for second postop visit date of surgery 01/08/2020 status post second metatarsal osteotomy with screws and a hammertoe repair second with wire.  He denies fever chills nausea vomiting muscle aches pains calf pain back pain chest pain shortness of breath.  Objective: Vital signs are stable he is alert and oriented x3 dry sterile dressing had been removed and he has been placing Band-Aids across his toes and foot.  Once removed demonstrates sutures are intact margins well coapted went ahead remove the sutures today margins remain well coapted K wires in place.  Assessment: Well-healing surgical foot.  Plan: Redressed the toe today compression dressing primarily to hold the pin in place.  I will follow-up with him in 2 weeks he is still to keep this elevated stay off of it is much as possible continue to apply ice and not get it wet.

## 2020-02-02 ENCOUNTER — Other Ambulatory Visit: Payer: Self-pay

## 2020-02-02 ENCOUNTER — Encounter: Payer: Self-pay | Admitting: Podiatry

## 2020-02-02 ENCOUNTER — Ambulatory Visit (INDEPENDENT_AMBULATORY_CARE_PROVIDER_SITE_OTHER): Payer: 59

## 2020-02-02 ENCOUNTER — Ambulatory Visit (INDEPENDENT_AMBULATORY_CARE_PROVIDER_SITE_OTHER): Payer: 59 | Admitting: Podiatry

## 2020-02-02 DIAGNOSIS — M2042 Other hammer toe(s) (acquired), left foot: Secondary | ICD-10-CM | POA: Diagnosis not present

## 2020-02-02 DIAGNOSIS — M216X2 Other acquired deformities of left foot: Secondary | ICD-10-CM | POA: Diagnosis not present

## 2020-02-02 DIAGNOSIS — Z9889 Other specified postprocedural states: Secondary | ICD-10-CM

## 2020-02-02 NOTE — Progress Notes (Signed)
He presents today third postop visit date of surgery 01/08/2020 sec metatarsal osteotomy with screw fixation and hammertoe repair second with screw fixation left.  States that these wires been coming out and I get the stabbing pains at night I put more tape on it because afraid the wires can come out.  Objective: Vital signs are stable alert oriented x3.  Pulses are palpable.  Dressing removed demonstrates wires intact and in good position.  Radiographs taken demonstrate that the wire is across the joint and most likely irritating the head of the second metatarsal.  This is more than likely the sharp stabbing pain that he was feeling.  I retrograded the pin just about 1 cm and this should help alleviate those symptoms.  Also placed a spacer at the tip of the toe to help the pin from sliding back.  Redressed the foot today and I will follow-up with him in about 2-1/2 weeks at which time we are hoping to remove the wire.  Assessment: Well-healing surgical foot left.  Plan: Redressed today and I will follow-up with him in 2 and half weeks

## 2020-02-04 ENCOUNTER — Encounter: Payer: 59 | Admitting: Podiatry

## 2020-02-05 IMAGING — CR DG CHEST 2V
2 series · 2 of 2 positions shown · non-contrast
Comparison: 07/29/2007

CLINICAL DATA: Pt states he has sleep apnea, insomnia "and a whole
bunch of other stuff". Pt states this morning at home at 6866, he
became dizzy and passed out. Pt states he he hit on his right
temple, nose, and lip. Pt states he is not currently dizzy. Pt
states that his right ear feels congested as well.; HTN; ex smoker

EXAM:
CHEST - 2 VIEW

[w chest pa]
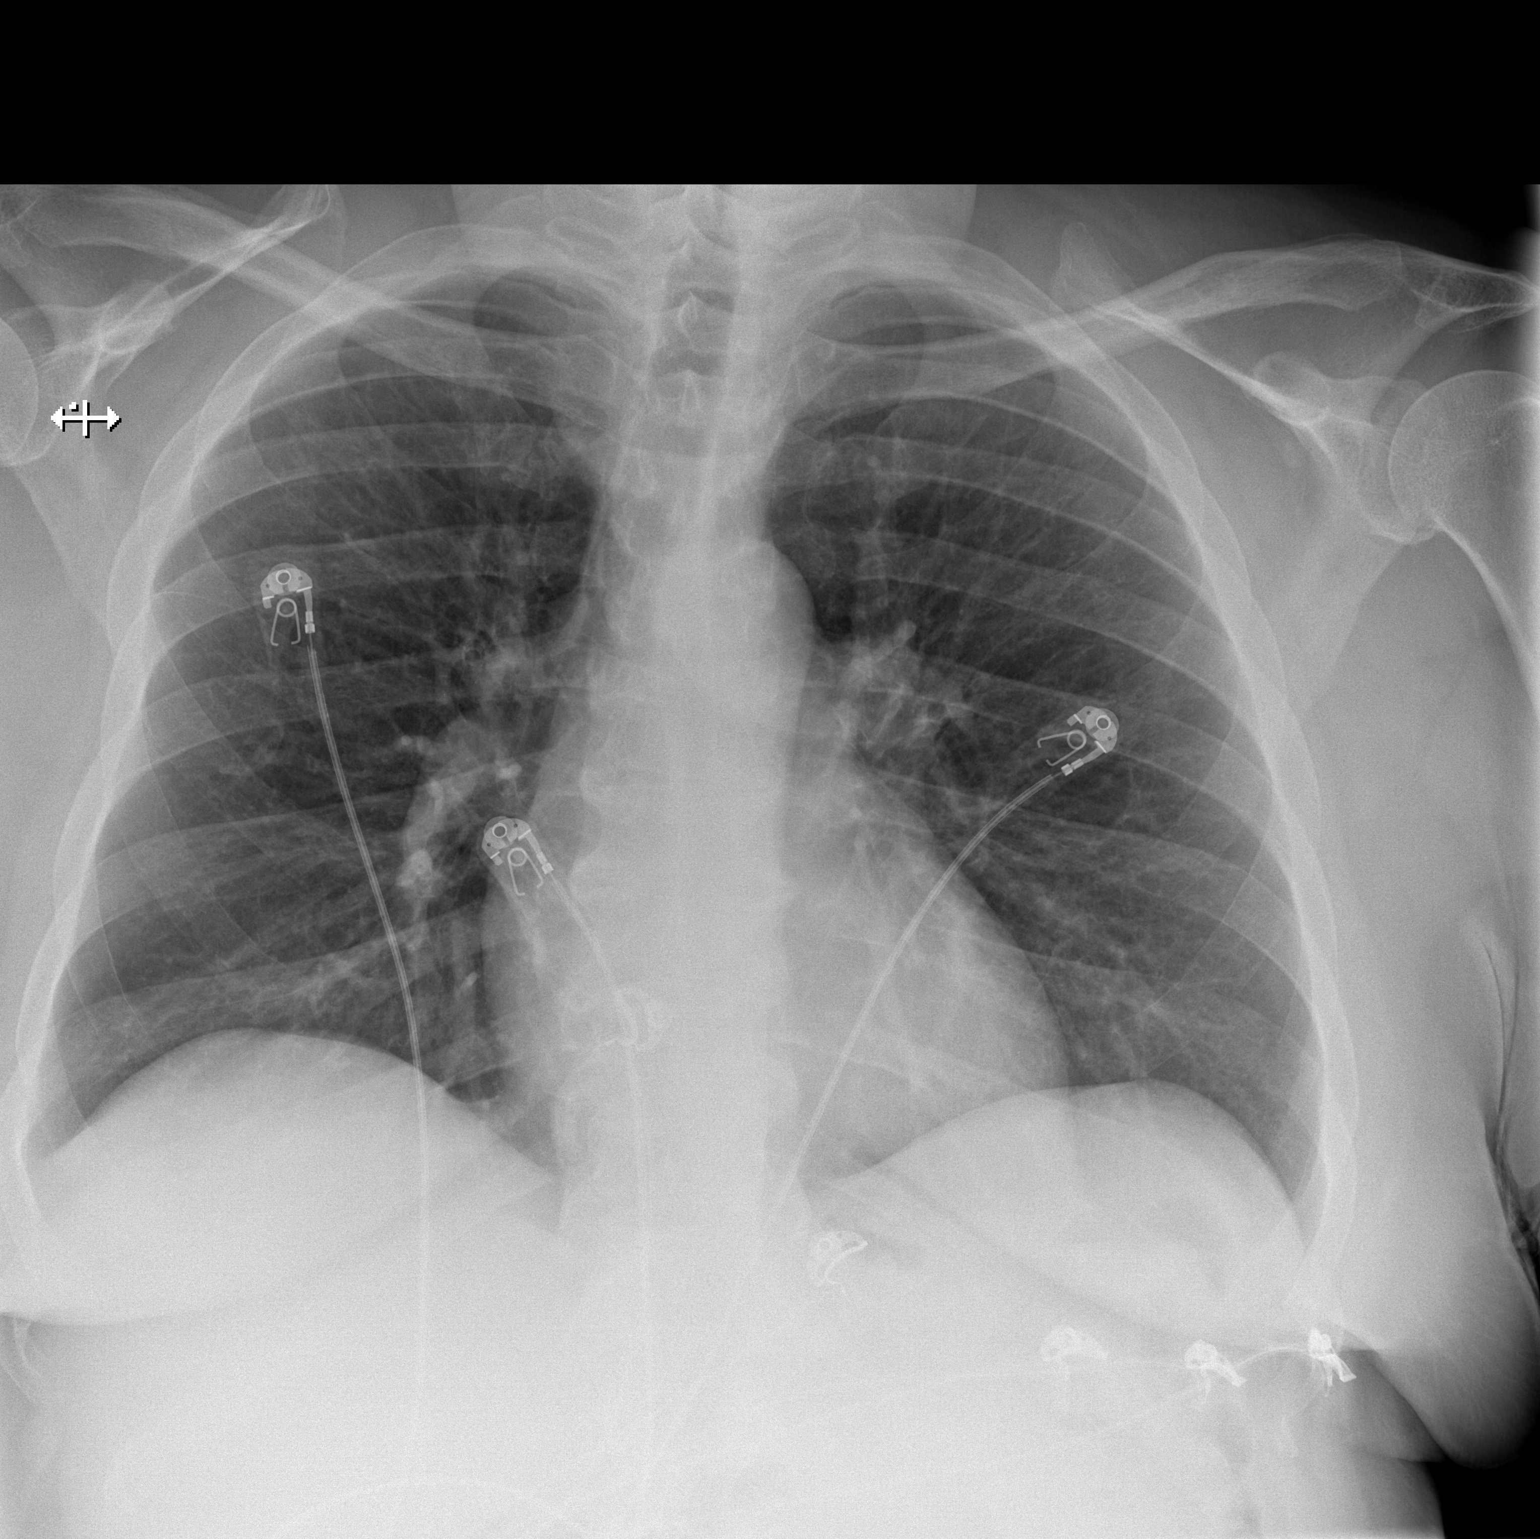

[w chest lat]
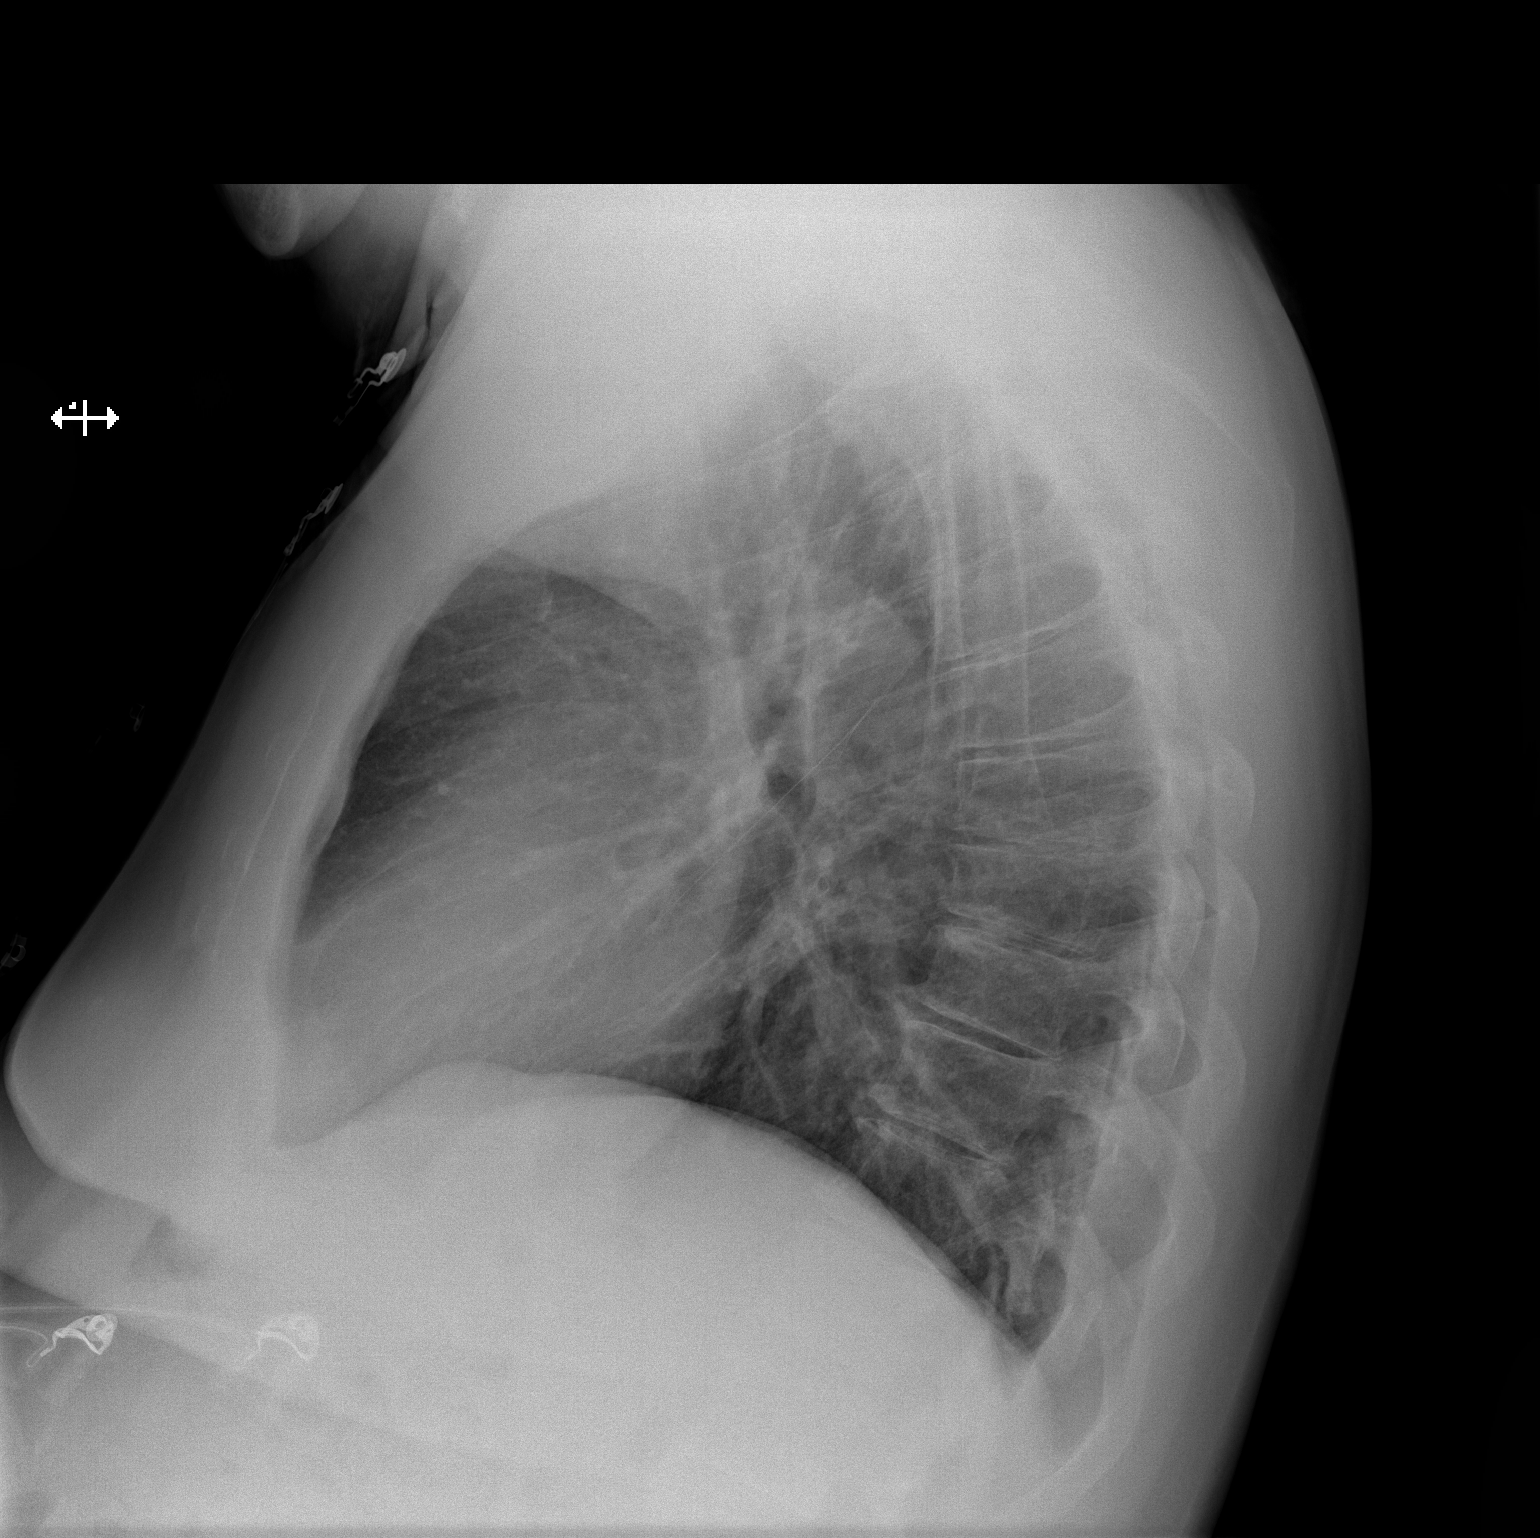

[2 of 2 positions shown; findings below may reference images not displayed]

FINDINGS: The heart size and mediastinal contours are within normal limits.
Both lungs are clear. No pleural effusion or pneumothorax. The
visualized skeletal structures are unremarkable.
IMPRESSION: No active cardiopulmonary disease.

## 2020-02-09 ENCOUNTER — Encounter: Payer: 59 | Admitting: Podiatry

## 2020-02-23 ENCOUNTER — Other Ambulatory Visit: Payer: Self-pay | Admitting: Family Medicine

## 2020-02-23 ENCOUNTER — Encounter: Payer: 59 | Admitting: Podiatry

## 2020-02-24 NOTE — Telephone Encounter (Signed)
[facility] is requesting to fill pt wellbutrin. Please advise. KH 

## 2020-02-25 ENCOUNTER — Ambulatory Visit (INDEPENDENT_AMBULATORY_CARE_PROVIDER_SITE_OTHER): Payer: 59 | Admitting: Podiatry

## 2020-02-25 ENCOUNTER — Other Ambulatory Visit: Payer: Self-pay

## 2020-02-25 ENCOUNTER — Ambulatory Visit (INDEPENDENT_AMBULATORY_CARE_PROVIDER_SITE_OTHER): Payer: 59

## 2020-02-25 ENCOUNTER — Encounter: Payer: Self-pay | Admitting: Podiatry

## 2020-02-25 DIAGNOSIS — M216X2 Other acquired deformities of left foot: Secondary | ICD-10-CM | POA: Diagnosis not present

## 2020-02-25 DIAGNOSIS — M2042 Other hammer toe(s) (acquired), left foot: Secondary | ICD-10-CM

## 2020-02-25 DIAGNOSIS — Z9889 Other specified postprocedural states: Secondary | ICD-10-CM

## 2020-02-25 NOTE — Progress Notes (Signed)
He presents today date of surgery 01/08/2020 sec metatarsal osteotomy hammertoe repair with screw and or pin.  He presents today with pin intact continue to wear his Darco shoe.  States that I am ready to get this crap out of my toe.  Objective: Vital signs are stable he is alert oriented x3.  There is mild edema no erythema cellulitis drainage or odor K wires in place to the second toe the toe is rectus.  Has good range of motion of the second metatarsophalangeal joint without pain.  Radiographs taken today demonstrate a well-healing capital osteotomy of the second toe left foot as well as a healing arthrodesis site of the PIPJ with K wire intact second toe left.  Assessment: Well-healing surgical foot second metatarsal osteotomy hammertoe repair with pain second left.  Plan: I went ahead and remove the K wire today toe remains rectus there is some valgus rotation of the toe but for the most part it is looking good and healthy he is happy with the outcome.  Put him in a compression anklet and allow him to start trying to wear his regular shoes and I will follow-up with him in a couple weeks.

## 2020-02-29 ENCOUNTER — Telehealth: Payer: Self-pay | Admitting: Family Medicine

## 2020-02-29 NOTE — Telephone Encounter (Signed)
Pt called and needs refill Pt Assistance Pristiq.  [employer] and reordered

## 2020-03-01 ENCOUNTER — Other Ambulatory Visit: Payer: Self-pay | Admitting: Family Medicine

## 2020-03-06 ENCOUNTER — Other Ambulatory Visit: Payer: Self-pay | Admitting: Family Medicine

## 2020-03-06 DIAGNOSIS — G4733 Obstructive sleep apnea (adult) (pediatric): Secondary | ICD-10-CM

## 2020-03-08 NOTE — Telephone Encounter (Signed)
Pt Assistance Pristiq recv'd #90 50mg .  Called pt informed

## 2020-03-08 NOTE — Telephone Encounter (Signed)
[facility] to [facility] trazodone. I think pt gets mail order. Please advise [facility]

## 2020-03-30 ENCOUNTER — Other Ambulatory Visit (INDEPENDENT_AMBULATORY_CARE_PROVIDER_SITE_OTHER): Payer: 59

## 2020-03-30 ENCOUNTER — Other Ambulatory Visit: Payer: Self-pay

## 2020-03-30 DIAGNOSIS — Z23 Encounter for immunization: Secondary | ICD-10-CM

## 2020-03-31 ENCOUNTER — Ambulatory Visit (INDEPENDENT_AMBULATORY_CARE_PROVIDER_SITE_OTHER): Payer: 59

## 2020-03-31 ENCOUNTER — Encounter: Payer: Self-pay | Admitting: Podiatry

## 2020-03-31 ENCOUNTER — Ambulatory Visit: Payer: 59 | Admitting: Orthotics

## 2020-03-31 ENCOUNTER — Ambulatory Visit (INDEPENDENT_AMBULATORY_CARE_PROVIDER_SITE_OTHER): Payer: 59 | Admitting: Podiatry

## 2020-03-31 DIAGNOSIS — M216X2 Other acquired deformities of left foot: Secondary | ICD-10-CM

## 2020-03-31 DIAGNOSIS — M2042 Other hammer toe(s) (acquired), left foot: Secondary | ICD-10-CM | POA: Diagnosis not present

## 2020-03-31 DIAGNOSIS — Z9889 Other specified postprocedural states: Secondary | ICD-10-CM

## 2020-03-31 NOTE — Progress Notes (Signed)
He presents today for postop visit date of surgery 01/08/2020 sec metatarsal osteotomy hammertoe repair with screw and pin fixation.  States that it feels a little bit weird because of the swelling but overall and very happy.  No pain unable to walk normal.  Objective: Vital signs are stable alert and oriented x3.  Pulses are palpable.  There is no erythema edema cellulitis drainage or odor is some mild edema directly over the metatarsophalangeal joint itself.  Radiographs taken today demonstrate a rectus second toe with a screw fixation internal fixation is in good position and is not loosening.  Assessment: Well-healing surgical toe second metatarsal and hammertoe left foot.  Plan: Follow-up with me on an as-needed basis we are going to request diabetic shoes because of digital deformity the need for surgery as well as peripheral vascular disease and neuropathy.

## 2020-03-31 NOTE — Progress Notes (Signed)
Patient not interested in Diabetic shoes

## 2020-04-06 ENCOUNTER — Ambulatory Visit: Payer: 59 | Admitting: Family Medicine

## 2020-04-07 ENCOUNTER — Ambulatory Visit: Payer: 59 | Admitting: Family Medicine

## 2020-04-07 ENCOUNTER — Other Ambulatory Visit: Payer: Self-pay

## 2020-04-07 ENCOUNTER — Encounter: Payer: Self-pay | Admitting: Family Medicine

## 2020-04-07 VITALS — BP 110/70 | HR 70 | Temp 97.0°F | Wt 262.6 lb

## 2020-04-07 DIAGNOSIS — Z23 Encounter for immunization: Secondary | ICD-10-CM | POA: Diagnosis not present

## 2020-04-07 DIAGNOSIS — M2550 Pain in unspecified joint: Secondary | ICD-10-CM

## 2020-04-07 NOTE — Patient Instructions (Signed)
2 Tylenol 4 times per day regularly for right now and you can add 2 Aleve twice per day if you need to rest just do that for the next couple weeks

## 2020-04-07 NOTE — Progress Notes (Signed)
   Subjective:    Patient ID: [name], male    DOB: [birth year], 64 y.o.   MRN: [id number]  HPI He states that he has a 1 month history of difficulty with shoulder back and knee pain.  No previous history of difficulty with this.  No fever, chills, sore throat, rash.  He has tried occasional doses of Tylenol or ibuprofen with some benefit.   Review of Systems     Objective:   Physical Exam Alert and in no distress.  No rash noted.  Exam of shoulder, knees, wrists, ankles shows no palpable tenderness or effusion.       Assessment & Plan:  Arthralgia, unspecified joint  Immunization, viral disease - Plan: Pfizer SARS-COV-2 Vaccine I explained that at this point there is no good reason why he should have this.  Recommend he start with [employer] care of 2 Tylenol 4 times per day and adding 2 Aleve twice per day if needed.  He should do this for at least several weeks and if still having difficulty, then call.

## 2020-04-15 ENCOUNTER — Other Ambulatory Visit: Payer: Self-pay | Admitting: Family Medicine

## 2020-04-18 ENCOUNTER — Other Ambulatory Visit: Payer: Self-pay

## 2020-04-18 ENCOUNTER — Telehealth: Payer: Self-pay

## 2020-04-18 MED ORDER — MONTELUKAST SODIUM 10 MG PO TABS
10.0000 mg | ORAL_TABLET | Freq: Every day | ORAL | 1 refills | Status: DC
Start: 2020-04-18 — End: 2020-11-07

## 2020-04-18 NOTE — Telephone Encounter (Signed)
Sent message to pt to find out if this med was truly needed. [name]

## 2020-04-18 NOTE — Telephone Encounter (Signed)
Received a fax from [facility] for a refill on the pts. Montelukast pt. Last apt was 04/07/20

## 2020-04-18 NOTE — Telephone Encounter (Signed)
Pt. Called back LM stating that he did need the refill on his montelukast it was refilled by his allergists before but he is not seeing him anymore and he is trying to find a new allergists.

## 2020-04-18 NOTE — Telephone Encounter (Signed)
Done KH 

## 2020-05-16 ENCOUNTER — Other Ambulatory Visit: Payer: Self-pay | Admitting: Family Medicine

## 2020-05-16 DIAGNOSIS — G4733 Obstructive sleep apnea (adult) (pediatric): Secondary | ICD-10-CM

## 2020-05-17 NOTE — Telephone Encounter (Signed)
[facility] to [facility] trazodone. Please advise KH °

## 2020-05-24 ENCOUNTER — Telehealth (INDEPENDENT_AMBULATORY_CARE_PROVIDER_SITE_OTHER): Payer: Self-pay

## 2020-05-25 ENCOUNTER — Ambulatory Visit (INDEPENDENT_AMBULATORY_CARE_PROVIDER_SITE_OTHER): Payer: 59 | Admitting: Otolaryngology

## 2020-06-06 ENCOUNTER — Telehealth: Payer: Self-pay

## 2020-06-06 NOTE — Telephone Encounter (Signed)
Pt called needs refill Pristiq Pt Assistance, called in refill t# [phone]

## 2020-06-09 NOTE — Telephone Encounter (Signed)
Called pt and advised time for Pt Assistance application renewal & he will complete online

## 2020-06-14 ENCOUNTER — Telehealth: Payer: Self-pay

## 2020-06-14 NOTE — Telephone Encounter (Signed)
Yes

## 2020-06-14 NOTE — Telephone Encounter (Signed)
Pt is applying for PT ASSISTANCE PRISTIQ, can pt have refills Pristiq for year for program?

## 2020-06-21 ENCOUNTER — Other Ambulatory Visit: Payer: Self-pay

## 2020-06-21 ENCOUNTER — Ambulatory Visit (INDEPENDENT_AMBULATORY_CARE_PROVIDER_SITE_OTHER): Payer: 59 | Admitting: Orthotics

## 2020-06-21 DIAGNOSIS — M216X2 Other acquired deformities of left foot: Secondary | ICD-10-CM | POA: Diagnosis not present

## 2020-06-21 DIAGNOSIS — M2042 Other hammer toe(s) (acquired), left foot: Secondary | ICD-10-CM

## 2020-06-21 DIAGNOSIS — E118 Type 2 diabetes mellitus with unspecified complications: Secondary | ICD-10-CM

## 2020-06-21 DIAGNOSIS — M216X1 Other acquired deformities of right foot: Secondary | ICD-10-CM

## 2020-06-21 NOTE — Progress Notes (Signed)
Patient picked up shoes/and pair of inserts.

## 2020-06-23 NOTE — Telephone Encounter (Signed)
Pristiq received, call pt and informed

## 2020-08-11 ENCOUNTER — Telehealth: Payer: Self-pay

## 2020-08-11 LAB — HM DIABETES EYE EXAM

## 2020-08-11 NOTE — Telephone Encounter (Signed)
Pt states he has had 3 Pfizer shots and heard with compromised immune system he should have a 4th shot & wanted to know your recommendations and if this is ok and when he should get the 4th?

## 2020-08-11 NOTE — Telephone Encounter (Signed)
Not is not yet the standard of care.

## 2020-08-15 NOTE — Telephone Encounter (Signed)
Pt informed

## 2020-08-17 DIAGNOSIS — H269 Unspecified cataract: Secondary | ICD-10-CM | POA: Insufficient documentation

## 2020-08-23 ENCOUNTER — Encounter: Payer: Self-pay | Admitting: Family Medicine

## 2020-08-24 ENCOUNTER — Encounter: Payer: Self-pay | Admitting: Family Medicine

## 2020-08-29 ENCOUNTER — Telehealth: Payer: Self-pay

## 2020-08-29 NOTE — Telephone Encounter (Signed)
Pt  Assistance Pristiq #90 pills received,  Called pt and left message

## 2020-09-05 ENCOUNTER — Telehealth: Payer: Self-pay | Admitting: Podiatry

## 2020-09-05 NOTE — Telephone Encounter (Signed)
Pt called and left message on billing line stating he keeps getting bills and emails for the diabetic shoes and inserts stating they were not covered and he is responsible and he is tired of getting the bills when he was told not to worry about it.   I returned call and explained that when I called the insurance they t(Andrew)old me the codes we would be using are covered @ 80% after deductible and pt had met deductible. I gave pt the reference number,date and name of person I contacted. He said thank you and he is contacting the government and we maybe getting a call from the government about it.

## 2020-09-15 ENCOUNTER — Encounter: Payer: Self-pay | Admitting: Family Medicine

## 2020-09-15 ENCOUNTER — Other Ambulatory Visit: Payer: Self-pay

## 2020-09-15 ENCOUNTER — Ambulatory Visit: Payer: 59 | Admitting: Family Medicine

## 2020-09-15 VITALS — BP 108/62 | HR 77 | Temp 97.3°F | Wt 258.0 lb

## 2020-09-15 DIAGNOSIS — M25512 Pain in left shoulder: Secondary | ICD-10-CM | POA: Diagnosis not present

## 2020-09-15 DIAGNOSIS — M7501 Adhesive capsulitis of right shoulder: Secondary | ICD-10-CM | POA: Diagnosis not present

## 2020-09-15 DIAGNOSIS — Z1211 Encounter for screening for malignant neoplasm of colon: Secondary | ICD-10-CM

## 2020-09-15 DIAGNOSIS — M25511 Pain in right shoulder: Secondary | ICD-10-CM | POA: Diagnosis not present

## 2020-09-15 DIAGNOSIS — M7502 Adhesive capsulitis of left shoulder: Secondary | ICD-10-CM

## 2020-09-15 NOTE — Progress Notes (Signed)
   Subjective:    Patient ID: [name], male    DOB: 09/02/1955, 65 y.o.   MRN: [id number]  HPI He complains of a 1 to 12-month history of intermittent bilateral shoulder pain.  He notes discomfort especially when he abducts and then internal or externally rotates his shoulder.  He has a previous history of rotator cuff injury 3 years ago but decided not to have surgery on it.  No history of injury or overuse. He also has concerns about colon cancer screening.   Review of Systems     Objective:   Physical Exam Exam of the shoulders does show decreased range of motion of internal as well as external rotation and to a lesser extent abduction.  Negative Neer's and Hawkins test.  Negative sulcus sign.  No palpable tenderness. Review of his record indicates that he is overdue for colon cancer screening and did have a previous colonoscopy which was negative.       Assessment & Plan:  Acute pain of both shoulders - Plan: Ambulatory referral to Physical Therapy  Adhesive capsulitis of both shoulders - Plan: Ambulatory referral to Physical Therapy  Screening for colon cancer - Plan: Cologuard I explained that I think the best thing here would be to send him to physical therapy for good shoulder rehab program.  He does have some evidence of adhesive capsulitis as well as probably just 1 week rotator cuff muscles. Explained that this might take some time before it feels better.

## 2020-10-06 LAB — COLOGUARD: Cologuard: NEGATIVE

## 2020-10-10 ENCOUNTER — Ambulatory Visit: Payer: 59 | Attending: Family Medicine

## 2020-10-11 LAB — COLOGUARD: COLOGUARD: NEGATIVE

## 2020-10-11 LAB — EXTERNAL GENERIC LAB PROCEDURE: COLOGUARD: NEGATIVE

## 2020-10-27 ENCOUNTER — Ambulatory Visit: Payer: 59

## 2020-11-07 ENCOUNTER — Encounter: Payer: Self-pay | Admitting: Family Medicine

## 2020-11-07 ENCOUNTER — Ambulatory Visit: Payer: 59 | Admitting: Family Medicine

## 2020-11-07 ENCOUNTER — Other Ambulatory Visit: Payer: Self-pay

## 2020-11-07 VITALS — BP 102/62 | HR 56 | Temp 96.4°F | Ht 69.5 in | Wt 251.6 lb

## 2020-11-07 DIAGNOSIS — Z0000 Encounter for general adult medical examination without abnormal findings: Secondary | ICD-10-CM | POA: Diagnosis not present

## 2020-11-07 DIAGNOSIS — B370 Candidal stomatitis: Secondary | ICD-10-CM

## 2020-11-07 DIAGNOSIS — K219 Gastro-esophageal reflux disease without esophagitis: Secondary | ICD-10-CM | POA: Diagnosis not present

## 2020-11-07 DIAGNOSIS — E1169 Type 2 diabetes mellitus with other specified complication: Secondary | ICD-10-CM | POA: Diagnosis not present

## 2020-11-07 DIAGNOSIS — Z794 Long term (current) use of insulin: Secondary | ICD-10-CM

## 2020-11-07 DIAGNOSIS — I152 Hypertension secondary to endocrine disorders: Secondary | ICD-10-CM | POA: Diagnosis not present

## 2020-11-07 DIAGNOSIS — F607 Dependent personality disorder: Secondary | ICD-10-CM

## 2020-11-07 DIAGNOSIS — E119 Type 2 diabetes mellitus without complications: Secondary | ICD-10-CM

## 2020-11-07 DIAGNOSIS — G4733 Obstructive sleep apnea (adult) (pediatric): Secondary | ICD-10-CM | POA: Diagnosis not present

## 2020-11-07 DIAGNOSIS — D508 Other iron deficiency anemias: Secondary | ICD-10-CM

## 2020-11-07 DIAGNOSIS — H259 Unspecified age-related cataract: Secondary | ICD-10-CM

## 2020-11-07 DIAGNOSIS — J309 Allergic rhinitis, unspecified: Secondary | ICD-10-CM

## 2020-11-07 DIAGNOSIS — E1159 Type 2 diabetes mellitus with other circulatory complications: Secondary | ICD-10-CM | POA: Diagnosis not present

## 2020-11-07 DIAGNOSIS — E785 Hyperlipidemia, unspecified: Secondary | ICD-10-CM

## 2020-11-07 DIAGNOSIS — I493 Ventricular premature depolarization: Secondary | ICD-10-CM

## 2020-11-07 DIAGNOSIS — Z23 Encounter for immunization: Secondary | ICD-10-CM | POA: Diagnosis not present

## 2020-11-07 DIAGNOSIS — Z9884 Bariatric surgery status: Secondary | ICD-10-CM

## 2020-11-07 DIAGNOSIS — R001 Bradycardia, unspecified: Secondary | ICD-10-CM

## 2020-11-07 LAB — POCT UA - MICROALBUMIN
Albumin/Creatinine Ratio, Urine, POC: 12.7
Creatinine, POC: 44.8 mg/dL
Microalbumin Ur, POC: 5.7 mg/L

## 2020-11-07 MED ORDER — LISINOPRIL 10 MG PO TABS
10.0000 mg | ORAL_TABLET | Freq: Every day | ORAL | 3 refills | Status: DC
Start: 2020-11-07 — End: 2021-11-28

## 2020-11-07 MED ORDER — MONTELUKAST SODIUM 10 MG PO TABS
10.0000 mg | ORAL_TABLET | Freq: Every day | ORAL | 1 refills | Status: DC
Start: 2020-11-07 — End: 2021-06-13

## 2020-11-07 MED ORDER — IPRATROPIUM BROMIDE 0.03 % NA SOLN: 2.0000 | Freq: Two times a day (BID) | NASAL | 3 refills | Status: DC

## 2020-11-07 NOTE — Progress Notes (Signed)
   Subjective:    Patient ID: [name], male    DOB: Aug 26, 1955, 65 y.o.   MRN: [id number]  HPI He is here for complete examination.  He continues have difficulty with mouth lesions.  Apparently at the present time things are going fairly well.  He is followed by Dr. Leslie Dales for his diabetes and is on Lantus, Jardiance and metformin.  His last hemoglobin A1c was 6.9.  He does have a cataract and is seen regularly by ophthalmology.  In the past he has been followed by Dr. Evelene Croon and he saw psychologist however is needed replacing both of them and will be working on this.  He does have OSA but is having some difficulties with this.  He is considering having Inspire done.  He does have reflux and uses Prilosec on a regular basis.  He has seen ENT in the past and does have a tube in his right ear.  He continues on his Flonase, Singulair as well as Atrovent for his allergies which seem to be under good control.  He otherwise has no concerns or complaints.  Family and social history as well as health maintenance and immunizations was reviewed   Review of Systems  All other systems reviewed and are negative.      Objective:   Physical Exam Alert and in no distress. Tympanic membranes and canals are normal. Pharyngeal area is normal. Neck is supple without adenopathy or thyromegaly. Cardiac exam shows an irregularrhythm without murmurs or gallops. Lungs are clear to auscultation. EKG read by me does show 1 PVC with bradycardia otherwise the parameters are in the normal range.      Assessment & Plan:  Routine general medical examination at a health care facility - Plan: CBC with Differential/Platelet, Comprehensive metabolic panel, Lipid panel  Type 2 diabetes mellitus without complication, with long-term current use of insulin (HCC) - Plan: CBC with Differential/Platelet, Comprehensive metabolic panel, Lipid panel, CANCELED: POCT glycosylated hemoglobin (Hb A1C)  Need for viral immunization  - Plan: PFIZER Comirnaty(GRAY TOP)COVID-19 Vaccine  Hyperlipidemia associated with type 2 diabetes mellitus (HCC) - Plan: Lipid panel  Gastroesophageal reflux disease without esophagitis  Obstructive sleep apnea  Status post bariatric surgery  Age-related cataract of both eyes, unspecified age-related cataract type  Hypertension associated with diabetes (HCC) - Plan: CBC with Differential/Platelet, Comprehensive metabolic panel, lisinopril (ZESTRIL) 10 MG tablet, EKG 12-Lead  Passive-dependent personality disorder (HCC)  Candidiasis, mouth  Allergic rhinitis, unspecified seasonality, unspecified trigger - Plan: montelukast (SINGULAIR) 10 MG tablet, ipratropium (ATROVENT) 0.03 % nasal spray  PVC (premature ventricular contraction) I reassured him that the PVC is of no great concern.  We will continue to monitor this.  Continue on his allergy medications and being followed by ID for his Candida.  He will work on getting a Therapist, sports and psychologist for his passive dependent personality.  Continue be followed by ophthalmology for his cataracts.  Continue to see Dr. Leslie Dales.

## 2020-11-08 LAB — COMPREHENSIVE METABOLIC PANEL
ALT: 21 IU/L (ref 0–44)
AST: 29 IU/L (ref 0–40)
Albumin/Globulin Ratio: 2 (ref 1.2–2.2)
Albumin: 4.4 g/dL (ref 3.8–4.8)
Alkaline Phosphatase: 80 IU/L (ref 44–121)
BUN/Creatinine Ratio: 14 (ref 10–24)
BUN: 13 mg/dL (ref 8–27)
Bilirubin Total: 0.3 mg/dL (ref 0.0–1.2)
CO2: 23 mmol/L (ref 20–29)
Calcium: 9.3 mg/dL (ref 8.6–10.2)
Chloride: 102 mmol/L (ref 96–106)
Creatinine, Ser: 0.9 mg/dL (ref 0.76–1.27)
Globulin, Total: 2.2 g/dL (ref 1.5–4.5)
Glucose: 53 mg/dL — ABNORMAL LOW (ref 65–99)
Potassium: 4.9 mmol/L (ref 3.5–5.2)
Sodium: 137 mmol/L (ref 134–144)
Total Protein: 6.6 g/dL (ref 6.0–8.5)
eGFR: 96 mL/min/{1.73_m2} (ref >59)

## 2020-11-08 LAB — LIPID PANEL
Chol/HDL Ratio: 2.5 ratio (ref 0.0–5.0)
Cholesterol, Total: 101 mg/dL (ref 100–199)
HDL: 40 mg/dL (ref >39)
LDL Chol Calc (NIH): 46 mg/dL (ref 0–99)
Triglycerides: 70 mg/dL (ref 0–149)
VLDL Cholesterol Cal: 15 mg/dL (ref 5–40)

## 2020-11-08 LAB — CBC WITH DIFFERENTIAL/PLATELET
Basophils Absolute: 0.1 10*3/uL (ref 0.0–0.2)
Basos: 1 %
EOS (ABSOLUTE): 0.1 10*3/uL (ref 0.0–0.4)
Eos: 1 %
Hematocrit: 32.7 % — ABNORMAL LOW (ref 37.5–51.0)
Hemoglobin: 8.9 g/dL — ABNORMAL LOW (ref 13.0–17.7)
Immature Grans (Abs): 0 10*3/uL (ref 0.0–0.1)
Immature Granulocytes: 0 %
Lymphocytes Absolute: 2.6 10*3/uL (ref 0.7–3.1)
Lymphs: 23 %
MCH: 16.7 pg — ABNORMAL LOW (ref 26.6–33.0)
MCHC: 27.2 g/dL — ABNORMAL LOW (ref 31.5–35.7)
MCV: 62 fL — ABNORMAL LOW (ref 79–97)
Monocytes Absolute: 1 10*3/uL — ABNORMAL HIGH (ref 0.1–0.9)
Monocytes: 9 %
Neutrophils Absolute: 7.8 10*3/uL — ABNORMAL HIGH (ref 1.4–7.0)
Neutrophils: 66 %
Platelets: 423 10*3/uL (ref 150–450)
RBC: 5.32 x10E6/uL (ref 4.14–5.80)
RDW: 21.5 % — ABNORMAL HIGH (ref 11.6–15.4)
WBC: 11.7 10*3/uL — ABNORMAL HIGH (ref 3.4–10.8)

## 2020-11-09 ENCOUNTER — Ambulatory Visit: Payer: 59

## 2020-11-09 LAB — IRON AND TIBC
Iron Saturation: 4 % — CL (ref 15–55)
Iron: 15 ug/dL — ABNORMAL LOW (ref 38–169)
Total Iron Binding Capacity: 384 ug/dL (ref 250–450)
UIBC: 369 ug/dL — ABNORMAL HIGH (ref 111–343)

## 2020-11-09 LAB — FERRITIN: Ferritin: 3 ng/mL — ABNORMAL LOW (ref 30–400)

## 2020-11-09 LAB — SPECIMEN STATUS REPORT

## 2020-11-09 NOTE — Addendum Note (Signed)
Addended by: Ronnald Nian on: 11/09/2020 02:04 PM   Modules accepted: Orders

## 2020-11-14 ENCOUNTER — Other Ambulatory Visit: Payer: Self-pay

## 2020-11-14 MED ORDER — FERROUS SULFATE 325 (65 FE) MG PO TBEC: 325.0000 mg | DELAYED_RELEASE_TABLET | Freq: Three times a day (TID) | ORAL | 1 refills | Status: DC

## 2020-11-22 ENCOUNTER — Ambulatory Visit: Payer: 59

## 2020-12-06 ENCOUNTER — Telehealth: Payer: Self-pay

## 2020-12-06 NOTE — Telephone Encounter (Signed)
Pt called for refills for Pristiq thru Pt Assistance, is this ok to refill? Please send back to me

## 2020-12-07 NOTE — Telephone Encounter (Signed)
ok 

## 2020-12-08 ENCOUNTER — Other Ambulatory Visit: Payer: 59

## 2020-12-13 ENCOUNTER — Other Ambulatory Visit: Payer: 59

## 2020-12-23 NOTE — Telephone Encounter (Signed)
Pristiq refilled

## 2021-01-26 ENCOUNTER — Telehealth: Payer: 59 | Admitting: Family Medicine

## 2021-01-26 ENCOUNTER — Other Ambulatory Visit: Payer: Self-pay

## 2021-01-26 VITALS — Ht 71.0 in | Wt 241.0 lb

## 2021-01-26 DIAGNOSIS — R194 Change in bowel habit: Secondary | ICD-10-CM

## 2021-01-26 NOTE — Progress Notes (Signed)
   Subjective:    Patient ID: [name], male    DOB: 1955-10-04, 65 y.o.   MRN: [id number]  HPI Documentation for virtual audio and video telecommunications through Caregility encounter: The patient was located at home. 2 patient identifiers used.  The provider was located in the office. The patient did consent to this visit and is aware of possible charges through their insurance for this visit. The other persons participating in this telemedicine service were none. Time spent on call was 7 minutes and in review of previous records >20 minutes total for counseling and coordination of care. This virtual service is not related to other E/M service within previous 7 days.  He was concerned about being constipated because he did not have a BM for 2 days and then took a laxative which caused diarrhea.  He then felt that he was constipated again because he did not have a bowel movement before another day and started this whole process again.  Review of Systems     Objective:   Physical Exam Alert and in no distress otherwise not examined       Assessment & Plan:  Bowel habit changes I discussed constipation with him explaining that no BM for 24 to 48 hours is not unusual.  The main issue is that he have softer BMs.  Recommend he take MiraLAX on a regular basis and avoid any laxatives.  As long as he has softer BMs, no further intervention needed.

## 2021-02-13 ENCOUNTER — Other Ambulatory Visit: Payer: Self-pay | Admitting: Family Medicine

## 2021-02-20 ENCOUNTER — Telehealth: Payer: Self-pay | Admitting: Family Medicine

## 2021-02-20 MED ORDER — BUPROPION HCL ER (SR) 200 MG PO TB12: 200.0000 mg | ORAL_TABLET | Freq: Two times a day (BID) | ORAL | 3 refills | Status: DC

## 2021-02-20 NOTE — Telephone Encounter (Signed)
[facility] req Bupropion SR tab

## 2021-02-21 ENCOUNTER — Ambulatory Visit: Payer: 59 | Admitting: Family Medicine

## 2021-02-23 ENCOUNTER — Ambulatory Visit: Payer: 59 | Admitting: Medical

## 2021-02-23 ENCOUNTER — Telehealth: Payer: Self-pay | Admitting: Family Medicine

## 2021-02-23 ENCOUNTER — Other Ambulatory Visit: Payer: Self-pay

## 2021-02-23 VITALS — BP 124/80 | HR 54 | Temp 97.7°F | Wt 244.4 lb

## 2021-02-23 DIAGNOSIS — E119 Type 2 diabetes mellitus without complications: Secondary | ICD-10-CM | POA: Diagnosis not present

## 2021-02-23 DIAGNOSIS — Z794 Long term (current) use of insulin: Secondary | ICD-10-CM | POA: Diagnosis not present

## 2021-02-23 DIAGNOSIS — H663X1 Other chronic suppurative otitis media, right ear: Secondary | ICD-10-CM | POA: Diagnosis not present

## 2021-02-23 DIAGNOSIS — Z9622 Myringotomy tube(s) status: Secondary | ICD-10-CM

## 2021-02-23 MED ORDER — AMOXICILLIN-POT CLAVULANATE 875-125 MG PO TABS: 1.0000 | ORAL_TABLET | Freq: Two times a day (BID) | ORAL | 0 refills | Status: DC

## 2021-02-23 NOTE — Progress Notes (Addendum)
Subjective:  [name] is a 65 y.o. male who presents for Chief Complaint  Patient presents with   right ear pain    Right ear pain- drainage, itchy- has allergies and stent in ear drum due to constant infections. Also has thrust and needs drops instead of pills      Here for right ear pain.   He notes 2 weeks of worse ear pain and drainage.   He has TM tube been in place in right ear for over  a year.  In past weeks worse drainage, pain, and decreased hearing.  Sees Dr. Dillard Owen, ENT  No fever, no chills, no body aches.  He has ongoing problems with thrush and seeing specialist in [place] soon for recurrent thrush issues.  Diabetes - he does check sugars.  This morning 81.  Lately been running low readings since he has lost some weight.   Sees endocrinology, sees new endocrinologist tomorrow.  Was seeing Dr. Theodoro Owen. Who recently retired.     No other aggravating or relieving factors.    No other c/o.  The following portions of the patient's history were reviewed and updated as appropriate: allergies, current medications, past family history, past medical history, past social history, past surgical history and problem list.  ROS Otherwise as in subjective above  Objective: BP 124/80   Pulse (!) 54   Temp 97.7 F (36.5 C)   Wt 244 lb 6.4 oz (110.9 kg)   SpO2 99%   BMI 34.09 kg/m   General appearance: alert, no distress, well developed, well nourished, afebrile HEENT: normocephalic, sclerae anicteric, conjunctiva pink and moist, TMs pearly, nares patent, no discharge or erythema, pharynx normal Oral cavity: MMM, no lesions Neck: supple, shoddy anterior nodes, otherwise no thyromegaly, no masses   Assessment: Encounter Diagnoses  Name Primary?   Chronic suppurative otitis media of right ear, unspecified otitis media location Yes   Type 2 diabetes mellitus without complication, with long-term current use of insulin (HCC)    S/P tympanic tube insertion       Plan: Begin Augmentin, hydrate well, continue f/u with ENT.  If worse or not improving, particularly if fever, aches, chills, then recheck ASAP.  Of note, he sees new endocrinologist tomorrow.  HgbA1C 5.6% in March 2022.   [name] was seen today for right ear pain.  Diagnoses and all orders for this visit:  Chronic suppurative otitis media of right ear, unspecified otitis media location  Type 2 diabetes mellitus without complication, with long-term current use of insulin (HCC)  S/P tympanic tube insertion  Other orders -     amoxicillin-clavulanate (AUGMENTIN) 875-125 MG tablet; Take 1 tablet by mouth 2 (two) times daily.   Follow up: prn

## 2021-02-23 NOTE — Telephone Encounter (Signed)
Reordered Pristiq, Pt [facility]

## 2021-02-23 NOTE — Telephone Encounter (Signed)
Pt needs Pristiq

## 2021-02-24 LAB — HEMOGLOBIN A1C: Hemoglobin A1C: 5.4

## 2021-02-27 ENCOUNTER — Telehealth: Payer: Self-pay

## 2021-02-27 NOTE — Telephone Encounter (Signed)
Pt [facility] Pristiq reordered 02/23/21

## 2021-02-27 NOTE — Telephone Encounter (Signed)
Pt Assistance Pristiq reordered 02/23/21 

## 2021-03-05 DIAGNOSIS — Z794 Long term (current) use of insulin: Secondary | ICD-10-CM | POA: Insufficient documentation

## 2021-03-07 NOTE — Telephone Encounter (Signed)
Pristiq #90 received, called pt informed

## 2021-03-13 ENCOUNTER — Other Ambulatory Visit: Payer: Self-pay

## 2021-03-13 ENCOUNTER — Telehealth: Payer: Self-pay | Admitting: Family Medicine

## 2021-03-13 MED ORDER — OMEPRAZOLE 40 MG PO CPDR
40.0000 mg | DELAYED_RELEASE_CAPSULE | Freq: Every day | ORAL | 1 refills | Status: DC
Start: 2021-03-13 — End: 2021-07-24

## 2021-03-13 NOTE — Telephone Encounter (Signed)
Done KH 

## 2021-03-13 NOTE — Telephone Encounter (Signed)
[facility]. Pt called and states that omeprazole is on back order at [facility]. Please send refill of [facility]ep[facility]ole to  [facility] [address] to send refills when received.

## 2021-03-17 ENCOUNTER — Encounter: Payer: Self-pay | Admitting: Family Medicine

## 2021-03-17 ENCOUNTER — Ambulatory Visit: Payer: 59 | Admitting: Family Medicine

## 2021-03-17 VITALS — BP 102/70 | HR 67 | Temp 96.0°F | Wt 234.8 lb

## 2021-03-17 DIAGNOSIS — Z23 Encounter for immunization: Secondary | ICD-10-CM | POA: Diagnosis not present

## 2021-03-17 DIAGNOSIS — M25512 Pain in left shoulder: Secondary | ICD-10-CM

## 2021-03-17 NOTE — Patient Instructions (Signed)
Take 4 ibuprofen 3 times per day.  You can also do heat to your shoulder for 20 minutes 3 times per day and gentle stretching after that.  Do that for the next 2 weeks and see what that will do to help going

## 2021-03-17 NOTE — Progress Notes (Signed)
   Subjective:    Patient ID: [name], male    DOB: August 08, 1955, 65 y.o.   MRN: [id number]  HPI He complains of a 3-day history of left shoulder pain.  No history of injury or overuse.  It tends to come and go and the pain can get worse for roughly 20 minutes.  He does note that abduction and internal rotation does make the slightly worse.  He has tried minimal doses of ibuprofen and Tylenol with no no relief of symptoms.   Review of Systems     Objective:   Physical Exam Alert and in no distress.  No  tenderness to palpation.  Negative sulcus sign.  Neer's and Hawkins test was negative.  He does have slight decreased range of motion with abduction and external rotation.       Assessment & Plan:  Acute pain of left shoulder I explained that I did not think that this was anything dangerous specifically not heart related and did recommend 800 mg 3 times daily of ibuprofen as well as heat and gentle stretching after the heat.  He is to do this for around 2 weeks.

## 2021-03-17 NOTE — Addendum Note (Signed)
Addended by: Renelda Loma on: 03/17/2021 01:43 PM   Modules accepted: Orders

## 2021-03-20 ENCOUNTER — Ambulatory Visit: Payer: 59 | Admitting: Family Medicine

## 2021-04-25 ENCOUNTER — Other Ambulatory Visit: Payer: Self-pay

## 2021-04-25 ENCOUNTER — Ambulatory Visit (INDEPENDENT_AMBULATORY_CARE_PROVIDER_SITE_OTHER): Payer: 59

## 2021-04-25 DIAGNOSIS — Z23 Encounter for immunization: Secondary | ICD-10-CM

## 2021-05-09 ENCOUNTER — Ambulatory Visit: Payer: 59 | Admitting: Family Medicine

## 2021-05-11 ENCOUNTER — Ambulatory Visit: Payer: 59 | Admitting: Family Medicine

## 2021-05-15 DIAGNOSIS — Z978 Presence of other specified devices: Secondary | ICD-10-CM | POA: Insufficient documentation

## 2021-05-17 NOTE — Telephone Encounter (Signed)
Received #90 Pristiq, left message for pt, also time for 2023 renewal

## 2021-05-18 ENCOUNTER — Other Ambulatory Visit: Payer: Self-pay

## 2021-05-18 ENCOUNTER — Ambulatory Visit: Payer: 59 | Admitting: Family Medicine

## 2021-05-18 NOTE — Progress Notes (Signed)
  Subjective:    Patient ID: [name], male    DOB: May 02, 1956, 64 y.o.   MRN: [id number]

## 2021-06-06 ENCOUNTER — Telehealth: Payer: Self-pay

## 2021-06-06 NOTE — Telephone Encounter (Signed)
Pt called and states got notice from [facility] that Pristiq no longer available due to now generic.  Insurance doesn't cover desvenlafaxine but does cover Venlafaxine and will cost $20 for 90 days.  Wants to know if he can switch to venlafaxine 50mg  #90 instead ?

## 2021-06-12 ENCOUNTER — Other Ambulatory Visit: Payer: Self-pay | Admitting: Family Medicine

## 2021-06-12 DIAGNOSIS — J309 Allergic rhinitis, unspecified: Secondary | ICD-10-CM

## 2021-06-13 NOTE — Telephone Encounter (Signed)
Called pt and he said he got another letter from [employer].  So I called [employer] and verified that he is indeed approved for 2023 and will receive 4 fills for 90 days.  I called pt and informed everything is fine for 2023 & to let me know when he needs refill

## 2021-07-13 ENCOUNTER — Telehealth: Payer: Self-pay | Admitting: Family Medicine

## 2021-07-13 NOTE — Telephone Encounter (Signed)
Pt is requesting a refill on Lantus Solostar [facility] [address]

## 2021-07-13 NOTE — Telephone Encounter (Signed)
Pt was advised to call his endocrinology doctor. KH

## 2021-07-14 LAB — HEMOGLOBIN A1C: Hemoglobin A1C: 6.3

## 2021-07-17 ENCOUNTER — Other Ambulatory Visit: Payer: Self-pay

## 2021-07-17 ENCOUNTER — Ambulatory Visit (INDEPENDENT_AMBULATORY_CARE_PROVIDER_SITE_OTHER): Payer: 59 | Admitting: Family Medicine

## 2021-07-17 ENCOUNTER — Telehealth: Payer: Self-pay

## 2021-07-17 ENCOUNTER — Encounter: Payer: Self-pay | Admitting: Family Medicine

## 2021-07-17 VITALS — BP 110/70 | HR 76 | Temp 98.3°F | Wt 219.8 lb

## 2021-07-17 DIAGNOSIS — D508 Other iron deficiency anemias: Secondary | ICD-10-CM | POA: Diagnosis not present

## 2021-07-17 DIAGNOSIS — H6591 Unspecified nonsuppurative otitis media, right ear: Secondary | ICD-10-CM | POA: Diagnosis not present

## 2021-07-17 DIAGNOSIS — Z9622 Myringotomy tube(s) status: Secondary | ICD-10-CM

## 2021-07-17 MED ORDER — NEOMYCIN-POLYMYXIN-HC 3.5-10000-1 OT SUSP: 3.0000 [drp] | Freq: Four times a day (QID) | OTIC | 0 refills | Status: DC

## 2021-07-17 MED ORDER — AMOXICILLIN-POT CLAVULANATE 875-125 MG PO TABS: 1.0000 | ORAL_TABLET | Freq: Two times a day (BID) | ORAL | 0 refills | Status: DC

## 2021-07-17 MED ORDER — IRON SLOW RELEASE 142 (45 FE) MG PO TBCR: 1.0000 | EXTENDED_RELEASE_TABLET | Freq: Every day | ORAL | 5 refills | Status: AC

## 2021-07-17 NOTE — Telephone Encounter (Signed)
Lvm for t to call back and schedule an appointment for a month away to do repeat a CBC and recheck his right ear. [place]

## 2021-07-17 NOTE — Progress Notes (Signed)
° °  Subjective:    Patient ID: [name], male    DOB: [birth year], 66 y.o.   MRN: [id number]  HPI He is here for consult.  He is followed in [facility] for his oral lesions and presently is on Diflucan.  Apparently there is a question about making sure he has no liver toxicities.  He also notes that he is now having some drainage from the right ear.  He has a previous history of difficulty with this and does have a myringotomy tube placed.  No fever or chills.  He also has a history of anemia and was placed on iron however he stopped the iron but did not tell us.  He was supposed to return in June for recheck which she did not.  He has had a previous Cologuard which was negative.  The blood work did show evidence of iron deficiency anemia.   Review of Systems     Objective:   Physical Exam Alert and in no distress.  Small amount of drainage noted in the right TM with the tube in place.  No erythema is noted.  Left TM is normal.       Assessment & Plan:  Right serous otitis media, unspecified chronicity - Plan: amoxicillin-clavulanate (AUGMENTIN) 875-125 MG tablet, neomycin-polymyxin-hydrocortisone (CORTISPORIN) 3.5-10000-1 OTIC suspension  Iron deficiency anemia secondary to inadequate dietary iron intake - Plan: Ferrous Sulfate (IRON SLOW RELEASE) 142 (45 Fe) MG TBCR  Retained myringotomy tube in right ear The medical record was reviewed and Dr. Ezzard Standing did use this regimen for his otitis in the past.  I will place him back on it and he will call me if he has further difficulties. And then discussed the iron with him.  I will place him on a slow release variety and discussed the use of MiraLAX on a regular basis to help him with his BMs.  He is to return here in 1 month for follow-up blood work.

## 2021-07-17 NOTE — Addendum Note (Signed)
Addended by: Denita Lung on: 07/17/2021 01:44 PM   Modules accepted: Orders

## 2021-07-18 LAB — COMPREHENSIVE METABOLIC PANEL
ALT: 16 IU/L (ref 0–44)
AST: 23 IU/L (ref 0–40)
Albumin/Globulin Ratio: 1.8 (ref 1.2–2.2)
Albumin: 4.2 g/dL (ref 3.8–4.8)
Alkaline Phosphatase: 75 IU/L (ref 44–121)
BUN/Creatinine Ratio: 15 (ref 10–24)
BUN: 13 mg/dL (ref 8–27)
Bilirubin Total: 0.4 mg/dL (ref 0.0–1.2)
CO2: 22 mmol/L (ref 20–29)
Calcium: 10 mg/dL (ref 8.6–10.2)
Chloride: 104 mmol/L (ref 96–106)
Creatinine, Ser: 0.86 mg/dL (ref 0.76–1.27)
Globulin, Total: 2.4 g/dL (ref 1.5–4.5)
Glucose: 135 mg/dL — ABNORMAL HIGH (ref 70–99)
Potassium: 5.2 mmol/L (ref 3.5–5.2)
Sodium: 139 mmol/L (ref 134–144)
Total Protein: 6.6 g/dL (ref 6.0–8.5)
eGFR: 97 mL/min/{1.73_m2} (ref >59)

## 2021-07-18 LAB — CBC WITH DIFFERENTIAL/PLATELET
Basophils Absolute: 0.1 10*3/uL (ref 0.0–0.2)
Basos: 1 %
EOS (ABSOLUTE): 0.1 10*3/uL (ref 0.0–0.4)
Eos: 1 %
Hematocrit: 33.3 % — ABNORMAL LOW (ref 37.5–51.0)
Hemoglobin: 9.6 g/dL — ABNORMAL LOW (ref 13.0–17.7)
Immature Grans (Abs): 0 10*3/uL (ref 0.0–0.1)
Immature Granulocytes: 0 %
Lymphocytes Absolute: 1.6 10*3/uL (ref 0.7–3.1)
Lymphs: 19 %
MCH: 18.4 pg — ABNORMAL LOW (ref 26.6–33.0)
MCHC: 28.8 g/dL — ABNORMAL LOW (ref 31.5–35.7)
MCV: 64 fL — ABNORMAL LOW (ref 79–97)
Monocytes Absolute: 0.7 10*3/uL (ref 0.1–0.9)
Monocytes: 8 %
Neutrophils Absolute: 6 10*3/uL (ref 1.4–7.0)
Neutrophils: 71 %
Platelets: 478 10*3/uL — ABNORMAL HIGH (ref 150–450)
RBC: 5.22 x10E6/uL (ref 4.14–5.80)
RDW: 20.1 % — ABNORMAL HIGH (ref 11.6–15.4)
WBC: 8.4 10*3/uL (ref 3.4–10.8)

## 2021-07-23 ENCOUNTER — Other Ambulatory Visit: Payer: Self-pay | Admitting: Family Medicine

## 2021-08-01 NOTE — Telephone Encounter (Signed)
Called pt and informed Pristiq #90 Pt Assistance medication received.

## 2021-08-17 LAB — HM DIABETES EYE EXAM

## 2021-09-25 ENCOUNTER — Ambulatory Visit: Payer: 59 | Admitting: Family Medicine

## 2021-09-25 ENCOUNTER — Encounter: Payer: Self-pay | Admitting: Family Medicine

## 2021-09-25 VITALS — BP 128/78 | HR 68 | Ht 71.0 in | Wt 214.8 lb

## 2021-09-25 DIAGNOSIS — J309 Allergic rhinitis, unspecified: Secondary | ICD-10-CM

## 2021-09-25 DIAGNOSIS — M25519 Pain in unspecified shoulder: Secondary | ICD-10-CM | POA: Diagnosis not present

## 2021-09-25 NOTE — Progress Notes (Signed)
? ?  Subjective:  ? ? Patient ID: [name], male    DOB: [birth year], 66 y.o.   MRN: [id number] ? ?HPI ?He is here for consult concerning getting a form filled out for work accommodations.  Apparently he has been working at home through [employer] and now they are giving him new responsibilities which he states he is not sure that he can accomplish.  Review of the paperwork indicates that there are a lot of physical activities that they now want him to do.  He cites allergies, previous history of shoulder surgery requiring rotator cuff surgery that he has not been able to do due to finances.  He also states that he does not think he can lift and do his many things physically as they are asking him to do although he has yet to try any of these to see how much she can or cannot do.  He states that he will occasionally have back pain if he does any heavy lifting but it usually goes away fairly quickly.  He does state that he has occasional bouts of dizziness but these go away very quickly and are usually associated with physical activity and then stopping them relatively quickly. ? ? ?Review of Systems ? ?   ?Objective:  ? Physical Exam ?Alert and in no distress otherwise not examined ? ? ? ?   ?Assessment & Plan:  ?Shoulder pain, unspecified chronicity, unspecified laterality - Plan: Ambulatory referral to Orthopedic Surgery ? ?Allergic rhinitis, unspecified seasonality, unspecified trigger ?I explained that his allergies should not interfere with his ability to work as long as he is staying on medications and using them effectively.  I then discussed the fact that he has not really tried any of the activities that they ask him to do to see if he can indeed accomplish them.  If not then we could potentially refer to physical therapy for work hardening.  He was more interested in seeing if orthopedics could help get work restrictions. ? ?

## 2021-09-28 ENCOUNTER — Telehealth: Payer: Self-pay | Admitting: Family Medicine

## 2021-09-28 NOTE — Telephone Encounter (Signed)
Pt called and is requesting a referral for a allgery dr he would like to go to Ivor Reining, MD at [facility] in [place] if possible, ? ? ?

## 2021-09-29 ENCOUNTER — Other Ambulatory Visit: Payer: Self-pay

## 2021-09-29 DIAGNOSIS — I493 Ventricular premature depolarization: Secondary | ICD-10-CM

## 2021-09-29 DIAGNOSIS — J309 Allergic rhinitis, unspecified: Secondary | ICD-10-CM

## 2021-09-29 NOTE — Telephone Encounter (Signed)
Order placed please co sign. KH ?

## 2021-10-11 ENCOUNTER — Ambulatory Visit: Payer: Self-pay

## 2021-10-11 ENCOUNTER — Encounter: Payer: Self-pay | Admitting: Orthopedic Surgery

## 2021-10-11 ENCOUNTER — Ambulatory Visit: Payer: 59 | Admitting: Orthopedic Surgery

## 2021-10-11 ENCOUNTER — Ambulatory Visit (INDEPENDENT_AMBULATORY_CARE_PROVIDER_SITE_OTHER): Payer: 59

## 2021-10-11 DIAGNOSIS — M25512 Pain in left shoulder: Secondary | ICD-10-CM

## 2021-10-11 DIAGNOSIS — M25511 Pain in right shoulder: Secondary | ICD-10-CM

## 2021-10-11 NOTE — Progress Notes (Signed)
? ?Office Visit Note ?  ?Patient: [name]           ?Date of Birth: [birth year]           ?MRN: [id number] ?Visit Date: 10/11/2021 ?Requested by: Denita Lung, MD ?[address] ?[place],  [place] [address] ?PCP: Denita Lung, MD ? ?Subjective: ?Chief Complaint  ?Patient presents with  ? Right Shoulder - New Patient (Initial Visit)  ? Left Shoulder - New Patient (Initial Visit)  ? ? ?HPI: [name] is a 66 year old patient with bilateral shoulder pain right worse than left.  Has not had any recent shoulder surgeries and takes Tylenol occasionally for symptoms.  Last injury was a fall around 3 months ago.  Shoulders were not injured at that time.  He states he was diagnosed with a rotator cuff tear years ago.  Never had intervention and it did get better on its own.  Left shoulder is hurting more because of compensation for the right.  Hard for him to do heavy lifting over 30 pounds.  No recent injections. ?             ?ROS: All systems reviewed are negative as they relate to the chief complaint within the history of present illness.  Patient denies  fevers or chills. ? ? ?Assessment & Plan: ?Visit Diagnoses:  ?1. Right shoulder pain, unspecified chronicity   ?2. Left shoulder pain, unspecified chronicity   ? ? ?Plan: Impression is bilateral shoulder pain right worse than left.  Looks like bursitis versus biceps tendinitis.  Range of motion and strength excellent.  Recommend change over from medium to light duty work for 6 months to ease the transition of his shoulders from home employment to at work employment.  Follow-up as needed.  Subacromial injection would be the next step. ? ?Follow-Up Instructions: No follow-ups on file.  ? ?Orders:  ?Orders Placed This Encounter  ?Procedures  ? XR Shoulder Right  ? XR Shoulder Left  ? ?No orders of the defined types were placed in this encounter. ? ? ? ? Procedures: ?No procedures performed ? ? ?Clinical Data: ?No additional findings. ? ?Objective: ?Vital  Signs: There were no vitals taken for this visit. ? ?Physical Exam:  ? ?Constitutional: Patient appears well-developed ?HEENT:  ?Head: Normocephalic ?Eyes:EOM are normal ?Neck: Normal range of motion ?Cardiovascular: Normal rate ?Pulmonary/chest: Effort normal ?Neurologic: Patient is alert ?Skin: Skin is warm ?Psychiatric: Patient has normal mood and affect ? ? ?Ortho Exam: Ortho exam demonstrates excellent rotator cuff strength bilaterally to infraspinatus supraspinatus subscap muscle testing.  Cervical spine range of motion good.  No discrete tenderness to the [facility] joint to direct palpation.  No masses lymphadenopathy or skin changes noted in bilateral shoulder region.  Negative O'Brien's testing bilaterally.  Passive range of motion bilaterally 45/110/170 ? ?Specialty Comments:  ?No specialty comments available. ? ?Imaging: ?XR Shoulder Left ? ?Result Date: 10/11/2021 ?AP axillary outlet radiographs left shoulder reviewed.  No acute fracture.  No glenohumeral joint or AC joint arthritis.  Normal acromiohumeral distance.  Visualized lung fields clear.  Shoulder is located. ? ?XR Shoulder Right ? ?Result Date: 10/11/2021 ?AP axillary outlet radiographs right shoulder reviewed.  Shoulder is located.  No acute fracture.  No glenohumeral joint or AC joint arthritis.  Acromiohumeral distance maintained.  Visualized lung fields clear  ? ? ?PMFS History: ?Patient Active Problem List  ? Diagnosis Date Noted  ? Chronic suppurative otitis media of right ear 02/23/2021  ? S/P tympanic tube  insertion 02/23/2021  ? Allergic rhinitis 11/07/2020  ? PVC (premature ventricular contraction) 11/07/2020  ? Bradycardia 11/07/2020  ? Cataract of both eyes 08/17/2020  ? Nummular eczema 08/13/2017  ? Onychomycosis 08/30/2016  ? Status post bariatric surgery 08/06/2016  ? Candidiasis, mouth 03/22/2016  ? Passive-dependent personality disorder ([place]) 08/02/2015  ? Hypertension associated with diabetes ([place]) 06/16/2015  ? Obstructive sleep apnea  07/09/2014  ? Abnormal CT scan 12/23/2012  ? Diabetes ([place]) 05/11/2008  ? Hyperlipidemia associated with type 2 diabetes mellitus ([place]) 05/11/2008  ? GERD 05/11/2008  ? ?Past Medical History:  ?Diagnosis Date  ? Anxiety   ? Arthritis   ? back, shoulders, left great toe  ? Candidiasis, mouth 03/22/2016  ? Carpal tunnel syndrome of right wrist 10/2014  ? Dental crowns present   ? Depression   ? Dyslipidemia   ? Ear fullness 08/30/2016  ? GERD (gastroesophageal reflux disease)   ? Hypertension   ? under control with med., per pt.; has been on med. x 15-20 yr.  ? Insulin dependent diabetes mellitus   ? Nummular eczema   ? Obesity   ? Onychomycosis 08/30/2016  ? Recurrent infections 03/22/2016  ? Sleep apnea   ? uses CPAP nightly  ?  ?Family History  ?Problem Relation Age of Onset  ? Heart disease Mother   ? Cancer Mother 24  ?     Multiple myeloma  ? Diabetes Father   ? Diabetes Brother   ? Heart disease Brother 64  ?     cardiomyopathy  ? Heart disease Brother 37  ?     MI  ? Arthritis Paternal Grandmother   ? Diabetes Paternal Grandmother   ? Heart disease Paternal Grandmother   ? Stroke Paternal Grandmother   ?  ?Past Surgical History:  ?Procedure Laterality Date  ? CARDIAC CATHETERIZATION  12/30/2006  ? "mod. pulmonary HTN with preserved cardiac output and cardiac index"  ? CARPAL TUNNEL RELEASE Right 10/14/2014  ? Procedure: RIGHT CARPAL TUNNEL RELEASE;  Surgeon: Leanora Cover, MD;  Location: [place];  Service: Orthopedics;  Laterality: Right;  ? CARPAL TUNNEL RELEASE Left 05/19/2015  ? Procedure: LEFT CARPAL TUNNEL RELEASE;  Surgeon: Leanora Cover, MD;  Location: [place];  Service: Orthopedics;  Laterality: Left;  ? CYST EXCISION    ? scalp and back  ? ESOPHAGOGASTRODUODENOSCOPY  03/25/2007  ? FOOT SURGERY Left   ? revision traumatic amputation of foot  ? ROUX-EN-Y PROCEDURE  07/28/2007  ? with lysis of adhesions  ? ?Social History  ? ?Occupational History  ? Not on file  ?Tobacco Use  ?  Smoking status: Former  ?  Packs/day: 1.00  ?  Years: 33.00  ?  Pack years: 33.00  ?  Types: Cigarettes  ?  Start date: 49  ?  Quit date: 07/03/2007  ?  Years since quitting: 14.2  ? Smokeless tobacco: Never  ?Substance and Sexual Activity  ? Alcohol use: Yes  ?  Comment: seldom  ? Drug use: No  ? Sexual activity: Not Currently  ? ? ? ? ? ?

## 2021-10-13 ENCOUNTER — Other Ambulatory Visit: Payer: Self-pay

## 2021-10-13 ENCOUNTER — Telehealth: Payer: Self-pay | Admitting: Family Medicine

## 2021-10-13 DIAGNOSIS — J309 Allergic rhinitis, unspecified: Secondary | ICD-10-CM

## 2021-10-13 MED ORDER — IPRATROPIUM BROMIDE 0.03 % NA SOLN: 2.0000 | Freq: Two times a day (BID) | NASAL | 1 refills | Status: DC

## 2021-10-13 MED ORDER — FLUTICASONE PROPIONATE 50 MCG/ACT NA SUSP: NASAL | 1 refills | Status: DC

## 2021-10-13 NOTE — Telephone Encounter (Signed)
[facility] sent refill request for atrovent nasal spray please send to the [facility] ([place], [place] ?

## 2021-10-13 NOTE — Telephone Encounter (Signed)
Done and pt was advised KH 

## 2021-10-31 ENCOUNTER — Other Ambulatory Visit: Payer: Self-pay | Admitting: Surgical

## 2021-10-31 ENCOUNTER — Telehealth: Payer: Self-pay | Admitting: Orthopedic Surgery

## 2021-10-31 MED ORDER — CELECOXIB 100 MG PO CAPS: 100.0000 mg | ORAL_CAPSULE | Freq: Two times a day (BID) | ORAL | 0 refills | Status: DC

## 2021-10-31 NOTE — Telephone Encounter (Signed)
Sent in RX for celebrex. Let me know if he cannot take NSAIDs or anti-inflammatories and we can switch to something else. Didn't see anything that was necessarily causing him to be unable to take NSAIDs in chart

## 2021-10-31 NOTE — Telephone Encounter (Signed)
Patient called asked if he can get some pain medication called into his pharmacy for him until his appointment on 10/18/2021? [facility] [address] [place], [place] number to contact patient is [phone]   or [phone] or [phone] ?

## 2021-11-01 NOTE — Telephone Encounter (Signed)
Patient aware this was sent in for him  

## 2021-11-03 ENCOUNTER — Telehealth: Payer: Self-pay | Admitting: Orthopedic Surgery

## 2021-11-03 NOTE — Telephone Encounter (Signed)
Kiante wants to know if he can take ibuprofen with his Celebrex or could get a prescription for another medication? The Celebrex alone is not helping with pain. Please advise. ?

## 2021-11-03 NOTE — Telephone Encounter (Signed)
If the Celebrex alone is not working then I would recommend taking with Tylenol.  Cannot take ibuprofen or other NSAIDs with Celebrex.  If this does not control his pain, I recommend making an appointment with Dr. Marlou Sa for subacromial injection as he recommended at his last appointment.

## 2021-11-03 NOTE — Telephone Encounter (Signed)
Tried calling again, no answer. LMVM advising per Leane Call note ?

## 2021-11-03 NOTE — Telephone Encounter (Signed)
Tried calling to advise. No answer. No VM picked up. Rang 6 times then busy signal ?

## 2021-11-09 ENCOUNTER — Encounter: Payer: 59 | Admitting: Family Medicine

## 2021-11-13 ENCOUNTER — Telehealth: Payer: Self-pay | Admitting: Family Medicine

## 2021-11-13 NOTE — Telephone Encounter (Signed)
Pt called and is requesting a referral to Dr, Richardean Chimera, for his cpap, states it has been over 3 years for his last visit, and he needs a new cpap, he can be reached at [phone] ?

## 2021-11-14 ENCOUNTER — Other Ambulatory Visit: Payer: Self-pay

## 2021-11-14 DIAGNOSIS — G4733 Obstructive sleep apnea (adult) (pediatric): Secondary | ICD-10-CM

## 2021-11-17 ENCOUNTER — Encounter: Payer: Self-pay | Admitting: Orthopedic Surgery

## 2021-11-17 ENCOUNTER — Ambulatory Visit: Payer: 59 | Admitting: Orthopedic Surgery

## 2021-11-17 VITALS — Ht 71.0 in | Wt 220.0 lb

## 2021-11-17 DIAGNOSIS — M25511 Pain in right shoulder: Secondary | ICD-10-CM | POA: Diagnosis not present

## 2021-11-17 LAB — BASIC METABOLIC PANEL: Creatinine: 0.8 (ref <=1.3)

## 2021-11-17 LAB — COMPREHENSIVE METABOLIC PANEL: eGFR: >90

## 2021-11-17 LAB — HEMOGLOBIN A1C: Hemoglobin A1C: 6.6

## 2021-11-18 ENCOUNTER — Encounter: Payer: Self-pay | Admitting: Orthopedic Surgery

## 2021-11-18 NOTE — Progress Notes (Signed)
Office Visit Note   Patient: [name]           Date of Birth: 23-Sep-1955           MRN: [id number] Visit Date: 11/17/2021 Requested by: Denita Lung, MD [place],  [place] [address] PCP: Denita Lung, MD  Subjective: Chief Complaint  Patient presents with   Right Shoulder - Pain    HPI: [name] is a patient with right shoulder pain.  Seen last month.  Continues to have right shoulder pain which has increased.  Has been doing more heavy duty types of activities.  Describes sharp stabbing pains.  Does live alone.  He felt a pop in his shoulder when he was moving plants.  Has had pain and weakness in the right shoulder since that time.              ROS: All systems reviewed are negative as they relate to the chief complaint within the history of present illness.  Patient denies  fevers or chills.   Assessment & Plan: Visit Diagnoses:  1. Right shoulder pain, unspecified chronicity     Plan: Impression is Pap and right shoulder moving plans.  Could be biceps tendon rupture without complete Popeye deformity yet versus rotator cuff pathology.  Overall passive range of motion is maintained.  Symptoms ongoing now for 6 months.  Failure of conservative treatment.  Has been trying some over-the-counter medication without relief.  Plan at this time is MRI arthrogram of the right shoulder to evaluate rotator cuff tear as well as biceps tendon.  Follow-up after that study.  Follow-Up Instructions: Return for after MRI.   Orders:  Orders Placed This Encounter  Procedures   MR SHOULDER RIGHT W CONTRAST   DL FLUORO GUIDED NEEDLE [place] / INJECTTION/LOC   No orders of the defined types were placed in this encounter.     Procedures: No procedures performed   Clinical Data: No additional findings.  Objective: Vital Signs: Ht _0  (1.803 m)   Wt 220 lb (99.8 kg)   BMI 30.68 kg/m   Physical Exam:   Constitutional: Patient appears  well-developed HEENT:  Head: Normocephalic Eyes:EOM are normal Neck: Normal range of motion Cardiovascular: Normal rate Pulmonary/chest: Effort normal Neurologic: Patient is alert Skin: Skin is warm Psychiatric: Patient has normal mood and affect   Ortho Exam: Ortho exam demonstrates full active and passive range of motion of the cervical spine.  Patient has 5 out of 5 grip EPL FPL interosseous wrist flexion extension bicep triceps and deltoid strength.  Range of motion of both shoulders is 55/95/165.  Rotator cuff strength is pretty reasonable to infraspinatus supraspinatus and subscap muscle testing on the right-hand side with no discrete AC joint tenderness to direct palpation.  No masses lymphadenopathy or skin changes noted in that right shoulder girdle region.  Not too much in terms of coarse grinding or crepitus in the right shoulder with internal and external rotation of the shoulder at 0 and 90 degrees of abduction.  No discrete Popeye deformity present but O'Brien's testing is positive on the right negative on the left.  Speeds testing positive on the right negative on the left.  Specialty Comments:  No specialty comments available.  Imaging: No results found.   PMFS History: Patient Active Problem List   Diagnosis Date Noted   Chronic suppurative otitis media of right ear 02/23/2021   S/P tympanic tube insertion 02/23/2021   Allergic rhinitis 11/07/2020  PVC (premature ventricular contraction) 11/07/2020   Bradycardia 11/07/2020   Cataract of both eyes 08/17/2020   Nummular eczema 08/13/2017   Onychomycosis 08/30/2016   Status post bariatric surgery 08/06/2016   Candidiasis, mouth 03/22/2016   Passive-dependent personality disorder ([facility] [facility]) 08/02/2015   Hypertension associated with diabetes ([place]) 06/16/2015   Obstructive sleep apnea 07/09/2014   Abnormal CT scan 12/23/2012   Diabetes ([place]) 05/11/2008   Hyperlipidemia associated with type 2 diabetes mellitus ([place])  05/11/2008   GERD 05/11/2008   Past Medical History:  Diagnosis Date   Anxiety    Arthritis    back, shoulders, left great toe   Candidiasis, mouth 03/22/2016   Carpal tunnel syndrome of right wrist 10/2014   Dental crowns present    Depression    Dyslipidemia    Ear fullness 08/30/2016   GERD (gastroesophageal reflux disease)    Hypertension    under control with med., per pt.; has been on med. x 15-20 yr.   Insulin dependent diabetes mellitus    Nummular eczema    Obesity    Onychomycosis 08/30/2016   Recurrent infections 03/22/2016   Sleep apnea    uses CPAP nightly    Family History  Problem Relation Age of Onset   Heart disease Mother    Cancer Mother 18       Multiple myeloma   Diabetes Father    Diabetes Brother    Heart disease Brother 56       cardiomyopathy   Heart disease Brother 68       MI   Arthritis Paternal Grandmother    Diabetes Paternal Grandmother    Heart disease Paternal Grandmother    Stroke Paternal Grandmother     Past Surgical History:  Procedure Laterality Date   CARDIAC CATHETERIZATION  12/30/2006   "mod. pulmonary HTN with preserved cardiac output and cardiac index"   CARPAL TUNNEL RELEASE Right 10/14/2014   Procedure: RIGHT CARPAL TUNNEL RELEASE;  Surgeon: Leanora Cover, MD;  Location: [facility];  Service: Orthopedics;  Laterality: Right;   CARPAL TUNNEL RELEASE Left 05/19/2015   Procedure: LEFT CARPAL TUNNEL RELEASE;  Surgeon: Leanora Cover, MD;  Location: [facility];  Service: Orthopedics;  Laterality: Left;   CYST EXCISION     scalp and back   ESOPHAGOGASTRODUODENOSCOPY  03/25/2007   FOOT SURGERY Left    revision traumatic amputation of foot   ROUX-EN-Y PROCEDURE  07/28/2007   with lysis of adhesions   Social History   Occupational History   Not on file  Tobacco Use   Smoking status: Former    Packs/day: 1.00    Years: 33.00    Pack years: 33.00    Types: Cigarettes    Start date: 79    Quit date:  07/03/2007    Years since quitting: 14.3   Smokeless tobacco: Never  Substance and Sexual Activity   Alcohol use: Yes    Comment: seldom   Drug use: No   Sexual activity: Not Currently

## 2021-11-26 ENCOUNTER — Other Ambulatory Visit: Payer: Self-pay | Admitting: Surgical

## 2021-11-28 ENCOUNTER — Telehealth: Payer: Self-pay

## 2021-11-28 DIAGNOSIS — E1159 Type 2 diabetes mellitus with other circulatory complications: Secondary | ICD-10-CM

## 2021-11-28 MED ORDER — LISINOPRIL 10 MG PO TABS: 10.0000 mg | ORAL_TABLET | Freq: Every day | ORAL | 3 refills | Status: DC

## 2021-11-28 NOTE — Telephone Encounter (Signed)
Received fax from [facility] for a refill on Lisinopril last apt was 09/25/21 and next apt is 03/06/22.

## 2021-11-30 ENCOUNTER — Ambulatory Visit: Payer: 59 | Admitting: Family Medicine

## 2021-11-30 ENCOUNTER — Encounter: Payer: Self-pay | Admitting: Family Medicine

## 2021-11-30 VITALS — BP 110/60 | HR 69 | Temp 97.0°F | Wt 218.8 lb

## 2021-11-30 DIAGNOSIS — G4733 Obstructive sleep apnea (adult) (pediatric): Secondary | ICD-10-CM | POA: Diagnosis not present

## 2021-11-30 NOTE — Progress Notes (Signed)
   Subjective:    Patient ID: [name], male    DOB: 01/04/1956, 66 y.o.   MRN: [id number]  HPI He is here for consult concerning difficulty with sleep apnea.  He has been using CPAP for quite some time and has done fairly well on it until recently when he has noted increasing difficulty with sleeping.  He has not had a readout from his machine.  He has lost a significant amount of weight and in spite of this is actually gotten worse from the symptoms.   Review of Systems     Objective:   Physical Exam Alert and in no distress.  Epworth scale of 21 is noted. Blood work from May of last year was evaluated.      Assessment & Plan:   Obstructive sleep apnea Since he has not got a readout on this I explained that this could be related to the CPAP not working properly.  He will work with getting a read out to me for interpretation.

## 2021-12-01 ENCOUNTER — Telehealth: Payer: Self-pay | Admitting: Family Medicine

## 2021-12-01 ENCOUNTER — Telehealth: Payer: Self-pay

## 2021-12-01 NOTE — Telephone Encounter (Signed)
Pt called and is wanting to know if he can get something to help him sleep states he is having trouble, states he feel asleep driving yesterday  Pt uses  [id number] [place],  - [address]

## 2021-12-01 NOTE — Telephone Encounter (Signed)
Pt. Called stating they can not read his card at [facility]. So he will not be able to get that report to you today. He will have to call them back on Monday to see if there site is up then, so they can read his card then for his sleep study.

## 2021-12-05 ENCOUNTER — Telehealth: Payer: Self-pay | Admitting: Family Medicine

## 2021-12-05 NOTE — Telephone Encounter (Signed)
[name] called and wants you to know his c-pap machine is having tech issues so he cant give you a follow up at this time. He also asked for a refill on his sleep medication

## 2021-12-06 NOTE — Telephone Encounter (Signed)
Pt was advised [facility] 

## 2021-12-08 ENCOUNTER — Telehealth: Payer: Self-pay

## 2021-12-08 NOTE — Telephone Encounter (Signed)
Pt. Called stating he wanted to know if you could call in some anti biotics and or pain medicine for him because he had a dental procedure done. He stated the dentist office called in something to his pharmacy but the pharmacy told him they did not have a script from them. Per Dr. Susann Givens he needs to get in contact with the dentist office after hours phone line they should have a dentist on call.

## 2021-12-11 ENCOUNTER — Telehealth: Payer: Self-pay | Admitting: Family Medicine

## 2021-12-11 MED ORDER — DESVENLAFAXINE SUCCINATE ER 50 MG PO TB24: 50.0000 mg | ORAL_TABLET | Freq: Every day | ORAL | 1 refills | Status: DC

## 2021-12-11 NOTE — Telephone Encounter (Signed)
[name] asking for a refill on his Pristiq.

## 2021-12-12 ENCOUNTER — Encounter: Payer: Self-pay | Admitting: *Deleted

## 2021-12-12 ENCOUNTER — Other Ambulatory Visit: Payer: Self-pay | Admitting: Family Medicine

## 2021-12-12 DIAGNOSIS — J309 Allergic rhinitis, unspecified: Secondary | ICD-10-CM

## 2021-12-14 ENCOUNTER — Other Ambulatory Visit: Payer: Self-pay | Admitting: Family Medicine

## 2021-12-14 DIAGNOSIS — J309 Allergic rhinitis, unspecified: Secondary | ICD-10-CM

## 2021-12-24 ENCOUNTER — Other Ambulatory Visit: Payer: Self-pay | Admitting: Surgical

## 2021-12-27 NOTE — Telephone Encounter (Signed)
Called pt left message Pristiq #[phone] received

## 2022-01-03 ENCOUNTER — Ambulatory Visit
Admission: RE | Admit: 2022-01-03 | Discharge: 2022-01-03 | Disposition: A | Discharge status: Home / Self Care | Payer: 59 | Source: Ambulatory Visit | Attending: Orthopedic Surgery | Admitting: Orthopedic Surgery

## 2022-01-03 DIAGNOSIS — M25511 Pain in right shoulder: Secondary | ICD-10-CM

## 2022-01-03 MED ORDER — IOPAMIDOL (ISOVUE-M 200) INJECTION 41%
15.0000 mL | Freq: Once | INTRAMUSCULAR | Status: AC
  Administered 2022-01-03: 15 mL via INTRA_ARTICULAR

## 2022-01-04 ENCOUNTER — Telehealth: Payer: Self-pay | Admitting: Orthopedic Surgery

## 2022-01-04 NOTE — Telephone Encounter (Signed)
Patient called advised he is in a lot of pain and asked if he can get something called into the pharmacy. Patient said the Celebrex and Tylenol is not working. [facility] [address]. The number to contact patient is [phone]

## 2022-01-04 NOTE — Telephone Encounter (Signed)
Pt called again

## 2022-01-05 ENCOUNTER — Ambulatory Visit: Payer: 59 | Admitting: Orthopedic Surgery

## 2022-01-05 DIAGNOSIS — M25511 Pain in right shoulder: Secondary | ICD-10-CM

## 2022-01-05 DIAGNOSIS — M25512 Pain in left shoulder: Secondary | ICD-10-CM | POA: Diagnosis not present

## 2022-01-05 MED ORDER — CYCLOBENZAPRINE HCL 10 MG PO TABS: 10.0000 mg | ORAL_TABLET | Freq: Every day | ORAL | 0 refills | Status: DC

## 2022-01-05 NOTE — Telephone Encounter (Signed)
Saw Dr. August Saucer today, I am assuming they discussed it at his appointment

## 2022-01-06 ENCOUNTER — Encounter: Payer: Self-pay | Admitting: Orthopedic Surgery

## 2022-01-06 NOTE — Progress Notes (Signed)
Office Visit Note   Patient: [name]           Date of Birth: 1955-08-18           MRN: [id number] Visit Date: 01/05/2022 Requested by: Denita Lung, MD [address] [place],  [place] [address] PCP: Denita Lung, MD  Subjective: Chief Complaint  Patient presents with   Other     Scan review    HPI: Zakk is a 66 year old patient with right shoulder pain.  Reports worsening pain.  Pain will wake him from sleep at night.  Reports sharp pain with range of motion.  Tylenol and Celebrex helps.  Also reports a little left shoulder pain in the posterior aspect of his shoulder running down to the elbow but not below the elbow.  MRI scan is reviewed with the patient.  Does show partial-thickness supraspinatus tendinosis and tearing with no full-thickness tear.  Subscap teres minor and infraspinatus intact.  Biceps tendon poorly visible in the joint on my review of the imaging.              ROS: All systems reviewed are negative as they relate to the chief complaint within the history of present illness.  Patient denies  fevers or chills.   Assessment & Plan: Visit Diagnoses:  1. Right shoulder pain, unspecified chronicity   2. Left shoulder pain, unspecified chronicity     Plan: Impression is bilateral shoulder pain with right shoulder looking like he has some tendinosis and bursitis.  Wants to hold off on injection.  We will try Flexeril for spasm.  Left shoulder looks like that may be the same thing or could be more radicular in nature.  Would like to keep him from lifting more than 15 pounds for the next 6 months at work.  Follow-up as needed.  Do not see a surgical indication at this time in the shoulder.  Follow-Up Instructions: Return if symptoms worsen or fail to improve.   Orders:  No orders of the defined types were placed in this encounter.  Meds ordered this encounter  Medications   cyclobenzaprine (FLEXERIL) 10 MG tablet    Sig: Take 1 tablet (10  mg total) by mouth at bedtime.    Dispense:  30 tablet    Refill:  0      Procedures: No procedures performed   Clinical Data: No additional findings.  Objective: Vital Signs: There were no vitals taken for this visit.  Physical Exam:   Constitutional: Patient appears well-developed HEENT:  Head: Normocephalic Eyes:EOM are normal Neck: Normal range of motion Cardiovascular: Normal rate Pulmonary/chest: Effort normal Neurologic: Patient is alert Skin: Skin is warm Psychiatric: Patient has normal mood and affect   Ortho Exam: Ortho exam demonstrates no Popeye deformity in either arm.  Passive range of motion maintained in both arms 60/95/165.  Rotator cuff strength is actually pretty reasonable bilaterally to infraspinatus supraspinatus and subscap muscle testing with no discrete AC joint tenderness to direct palpation.  O'Brien's testing negative bilaterally.  Specialty Comments:  No specialty comments available.  Imaging: No results found.   PMFS History: Patient Active Problem List   Diagnosis Date Noted   Chronic suppurative otitis media of right ear 02/23/2021   S/P tympanic tube insertion 02/23/2021   Allergic rhinitis 11/07/2020   PVC (premature ventricular contraction) 11/07/2020   Bradycardia 11/07/2020   Cataract of both eyes 08/17/2020   Nummular eczema 08/13/2017   Onychomycosis 08/30/2016   Status post bariatric  surgery 08/06/2016   Candidiasis, mouth 03/22/2016   Passive-dependent personality disorder ([place]) 08/02/2015   Hypertension associated with diabetes ([place]) 06/16/2015   Obstructive sleep apnea 07/09/2014   Abnormal CT scan 12/23/2012   Diabetes ([facility]) 05/11/2008   Hyperlipidemia associated with type 2 diabetes mellitus ([place]) 05/11/2008   GERD 05/11/2008   Past Medical History:  Diagnosis Date   Anxiety    Arthritis    back, shoulders, left great toe   Candidiasis, mouth 03/22/2016   Carpal tunnel syndrome of right wrist 10/2014    Dental crowns present    Depression    Dyslipidemia    Ear fullness 08/30/2016   GERD (gastroesophageal reflux disease)    Hypertension    under control with med., per pt.; has been on med. x 15-20 yr.   Insulin dependent diabetes mellitus    Nummular eczema    Obesity    Onychomycosis 08/30/2016   Recurrent infections 03/22/2016   Sleep apnea    uses CPAP nightly    Family History  Problem Relation Age of Onset   Heart disease Mother    Cancer Mother 75       Multiple myeloma   Diabetes Father    Diabetes Brother    Heart disease Brother 50       cardiomyopathy   Heart disease Brother 52       MI   Arthritis Paternal Grandmother    Diabetes Paternal Grandmother    Heart disease Paternal Grandmother    Stroke Paternal Grandmother     Past Surgical History:  Procedure Laterality Date   CARDIAC CATHETERIZATION  12/30/2006   "mod. pulmonary HTN with preserved cardiac output and cardiac index"   CARPAL TUNNEL RELEASE Right 10/14/2014   Procedure: RIGHT CARPAL TUNNEL RELEASE;  Surgeon: Leanora Cover, MD;  Location: [facility];  Service: Orthopedics;  Laterality: Right;   CARPAL TUNNEL RELEASE Left 05/19/2015   Procedure: LEFT CARPAL TUNNEL RELEASE;  Surgeon: Leanora Cover, MD;  Location: [facility];  Service: Orthopedics;  Laterality: Left;   CYST EXCISION     scalp and back   ESOPHAGOGASTRODUODENOSCOPY  03/25/2007   FOOT SURGERY Left    revision traumatic amputation of foot   ROUX-EN-Y PROCEDURE  07/28/2007   with lysis of adhesions   Social History   Occupational History   Not on file  Tobacco Use   Smoking status: Former    Packs/day: 1.00    Years: 33.00    Total pack years: 33.00    Types: Cigarettes    Start date: 58    Quit date: 07/03/2007    Years since quitting: 14.5   Smokeless tobacco: Never  Substance and Sexual Activity   Alcohol use: Yes    Comment: seldom   Drug use: No   Sexual activity: Not Currently

## 2022-01-16 ENCOUNTER — Ambulatory Visit (INDEPENDENT_AMBULATORY_CARE_PROVIDER_SITE_OTHER): Payer: 59 | Admitting: Neurology

## 2022-01-16 ENCOUNTER — Encounter: Payer: Self-pay | Admitting: Neurology

## 2022-01-16 VITALS — BP 137/74 | HR 83 | Ht 71.0 in | Wt 220.0 lb

## 2022-01-16 DIAGNOSIS — Z9884 Bariatric surgery status: Secondary | ICD-10-CM

## 2022-01-16 DIAGNOSIS — K140 Glossitis: Secondary | ICD-10-CM | POA: Insufficient documentation

## 2022-01-16 DIAGNOSIS — Z9989 Dependence on other enabling machines and devices: Secondary | ICD-10-CM | POA: Insufficient documentation

## 2022-01-16 DIAGNOSIS — F341 Dysthymic disorder: Secondary | ICD-10-CM | POA: Insufficient documentation

## 2022-01-16 DIAGNOSIS — Z87898 Personal history of other specified conditions: Secondary | ICD-10-CM | POA: Diagnosis not present

## 2022-01-16 DIAGNOSIS — G2581 Restless legs syndrome: Secondary | ICD-10-CM

## 2022-01-16 NOTE — Progress Notes (Signed)
SLEEP MEDICINE CLINIC    Provider:  Larey Seat, MD  Primary Care Physician:  Denita Lung, [place] [address]     Referring Provider: Denita Lung, [place],  [place] [address]          Chief Complaint according to patient   Patient presents with:     New Patient (Initial Visit)           HISTORY OF PRESENT ILLNESS:  [name] is a 66 y.o.  African American male patient seen here as a referral on 01/16/2022 from Dr [employer] for a Sleep medicine consultation..  Chief concern according to patient :  Presents today to re -establish care with Dr Adelisa Satterwhite for sleep concerns. He had initial sleep study through Dr Brett Fairy office and then last SS was in 2016. He was set up with a new machine 12/2014 through [facility]. He has insomnia. He wakes up several times during the night. Worse when he wears the cpap. He was seeing a counselor who no longer practicing, they were prescribing trazodone. It helped.  He has had difficulties with falling asleep while driving. Memory concerns related to sleepiness, fatigue. In the process of weaning off pristiqe and wanting to start Trazodone again.     [name]  has a past medical history of Anxiety, Arthritis, Candidiasis, mouth (03/22/2016), Carpal tunnel syndrome of right wrist (10/2014), Dental crowns present, Depression, Dyslipidemia, Ear fullness (08/30/2016), GERD (gastroesophageal reflux disease), Hypertension, Insulin dependent diabetes mellitus, Nummular eczema, Obesity, Onychomycosis (08/30/2016), Recurrent infections (03/22/2016), and Sleep apnea.   The patient had the last sleep study in the year 2016 at [facility], with dr Annamaria Boots. He has used CPAP since then.  [facility] sleep study was a PSG,  with a result of an AHI ( Apnea Hypopnea index)  of 22.1/h, the vast majority of events were hypopneas 16.6/h and 5.5 was the AHI for apnea obstructive alone no central apneas.  There  were also several spontaneous arousals and there were periodic limb movements noted.  He only had 1 minute of desaturation under 88%.  At the time of his sleep study he was fitted with a [facility] AirFit fullface mask and medium size.  And the technician had made a note that there were several PVCs and PACs.   Weight at that time( June 2016)  was 295 pounds with a neck size of 17 inches.  He had gastric bypass after his first sleep study- he has incurable oral thrush, teeth loss, DM, swallowing difficulties, dry mouth. Lichen planus oralis.  Sleep relevant medical history:    Family medical /sleep history: brother  obese on CPAP with OSA. Social history:  Patient is working as Hotel manager, early Medical illustrator.  and lives in a household alone. Dog owner.  Tobacco use; quit 2009- with the gastric bypass. Marland Kitchen  ETOH use none ,  Caffeine intake in form of Coffee( 2-4 cups) Soda( /) Tea ( /) or energy drinks. Regular exercise in form of walking..        Sleep habits are as follows: The patient's dinner time is between 5.30-6 PM. The patient goes to bed at 12-1 AM , RLS sometimes, leg cramps.  and continues to sleep intervals of for 2 hours- and wakes for 1-2 bathroom breaks.   CPAP now makes him feel as if he is suffocating, swallowing air.  The preferred sleep position is sideways, with the support of  1 pillow, on a flat mattress .  Dreams are reportedly frequent.  6  AM is the usual rise time. The patient wakes up with an alarm.  He/ She reports not feeling refreshed or restored in AM, with symptoms such as dry mouth and residual fatigue. Naps are not taken , but he sometimes a has sleep attacks,  lasting from 5 to 15 minutes and are  no more refreshing than nocturnal sleep.    Review of Systems: Out of a complete 14 system review, the patient complains of only the following symptoms, and all other reviewed systems are negative.:   Reports orthostatic hypotension, and depression.  Poor teeth Has  used CPAP nightly , but the machine logs hours form 12 to 12, if he goes to bed by midnight one day he will log all CPAP therapy for the next day.  Fatigue, sleepiness , snoring, fragmented sleep, Insomnia , dry mouth, CPAP feels to strong- weight loss by bariatric bypass - lost 70 pounds, regained 45 and now lost again 50 pounds in 2022, 2023-  due to pain in mouth.    How likely are you to doze in the following situations: 0 = not likely, 1 = slight chance, 2 = moderate chance, 3 = high chance   Sitting and Reading? Watching Television? Sitting inactive in a public place (theater or meeting)? As a passenger in a car for an hour without a break? Lying down in the afternoon when circumstances permit? Sitting and talking to someone? Sitting quietly after lunch without alcohol? In a car, while stopped for a few minutes in traffic?   Total = 15/ 24 points   FSS endorsed at 53/ 63 points.   GDS: 8/ 15 points- clinical depression- dr Casimiro Needle.    Social History   Socioeconomic History   Marital status: Single    Spouse name: Not on file   Number of children: Not on file   Years of education: Not on file   Highest education level: Not on file  Occupational History   Not on file  Tobacco Use   Smoking status: Former    Packs/day: 1.00    Years: 33.00    Total pack years: 33.00    Types: Cigarettes    Start date: 43    Quit date: 07/03/2007    Years since quitting: 14.5   Smokeless tobacco: Never  Substance and Sexual Activity   Alcohol use: Yes    Comment: seldom   Drug use: No   Sexual activity: Not Currently  Other Topics Concern   Not on file  Social History Narrative   Not on file   Social Determinants of Health   Financial Resource Strain: Not on file  Food Insecurity: Not on file  Transportation Needs: Not on file  Physical Activity: Not on file  Stress: Not on file  Social Connections: Not on file    Family History  Problem Relation Age of Onset   Heart  disease Mother    Cancer Mother 36       Multiple myeloma   Diabetes Father    Diabetes Brother    Heart disease Brother 41       cardiomyopathy   Heart disease Brother 45       MI   Arthritis Paternal Grandmother    Diabetes Paternal Grandmother    Heart disease Paternal Grandmother    Stroke Paternal Grandmother     Past Medical History:  Diagnosis Date   Anxiety  Arthritis    back, shoulders, left great toe   Candidiasis, mouth 03/22/2016   Carpal tunnel syndrome of right wrist 10/2014   Dental crowns present    Depression    Dyslipidemia    Ear fullness 08/30/2016   GERD (gastroesophageal reflux disease)    Hypertension    under control with med., per pt.; has been on med. x 15-20 yr.   Insulin dependent diabetes mellitus    Nummular eczema    Obesity    Onychomycosis 08/30/2016   Recurrent infections 03/22/2016   Sleep apnea    uses CPAP nightly    Past Surgical History:  Procedure Laterality Date   CARDIAC CATHETERIZATION  12/30/2006   "mod. pulmonary HTN with preserved cardiac output and cardiac index"   CARPAL TUNNEL RELEASE Right 10/14/2014   Procedure: RIGHT CARPAL TUNNEL RELEASE;  Surgeon: Leanora Cover, MD;  Location: [facility];  Service: Orthopedics;  Laterality: Right;   CARPAL TUNNEL RELEASE Left 05/19/2015   Procedure: LEFT CARPAL TUNNEL RELEASE;  Surgeon: Leanora Cover, MD;  Location: [facility];  Service: Orthopedics;  Laterality: Left;   CYST EXCISION     scalp and back   ESOPHAGOGASTRODUODENOSCOPY  03/25/2007   FOOT SURGERY Left    revision traumatic amputation of foot   ROUX-EN-Y PROCEDURE  07/28/2007   with lysis of adhesions     Current Outpatient Medications on File Prior to Visit  Medication Sig Dispense Refill   acetaminophen (TYLENOL) 500 MG tablet Take 500 mg by mouth every 6 (six) hours as needed for mild pain.     ARIPiprazole (ABILIFY) 5 MG tablet Take by mouth.     ASPIRIN LOW DOSE 81 MG EC tablet TAKE 1  TABLET BY MOUTH AT  BEDTIME . SWALLOW WHOLE. 30 tablet 0   Azelastine HCl 137 MCG/SPRAY SOLN Place into both nostrils.     B Complex-C (B-COMPLEX WITH VITAMIN C) tablet Take 1 tablet by mouth 2 (two) times daily.     buPROPion (WELLBUTRIN SR) 200 MG 12 hr tablet Take 1 tablet (200 mg total) by mouth 2 (two) times daily. 180 tablet 3   calcium-vitamin D 250-100 MG-UNIT per tablet Take 1 tablet by mouth 2 (two) times daily.     celecoxib (CELEBREX) 100 MG capsule TAKE 1 CAPSULE BY MOUTH TWICE A DAY 60 capsule 0   cetirizine (ZYRTEC) 10 MG tablet Take 10 mg by mouth daily.     Continuous Blood Gluc Sensor (FREESTYLE LIBRE 2 SENSOR) MISC Inject 1 sensor to the skin every 14 days for continuous glucose monitoring.     cyclobenzaprine (FLEXERIL) 10 MG tablet Take 1 tablet (10 mg total) by mouth at bedtime. 30 tablet 0   DENTA 5000 PLUS 1.1 % CREA dental cream Take by mouth.     desvenlafaxine (PRISTIQ) 50 MG 24 hr tablet Take 1 tablet (50 mg total) by mouth daily. 90 tablet 1   empagliflozin (JARDIANCE) 25 MG TABS tablet Take 25 mg by mouth daily.     fluconazole (DIFLUCAN) 150 MG tablet Take 150 mg by mouth daily.     fluticasone (FLONASE) 50 MCG/ACT nasal spray USE 1 SPRAY NASALLY ONCE A  DAY FOR ALLERGIES 32 g 1   glucose blood test strip USE TO CHECK SUGAR 4 TIMES  DAILY.     insulin glargine (LANTUS SOLOSTAR) 100 UNIT/ML Solostar Pen Inject 21 Units into the skin daily.     Insulin Pen Needle 32G X 4 MM MISC Use  to inject insulin once a day or as directed     Insulin Syringe-Needle U-100 (INSULIN SYRINGE 1CC/31GX5/16") 31G X 5/16" 1 ML MISC Use as directed once daily ( Dx code E 11.65)     levocetirizine (XYZAL) 5 MG tablet Take 5 mg by mouth at bedtime.     lisinopril (ZESTRIL) 10 MG tablet Take 1 tablet (10 mg total) by mouth daily. 90 tablet 3   metFORMIN (GLUCOPHAGE) 1000 MG tablet Take 1,000 mg by mouth 2 (two) times daily with a meal.     montelukast (SINGULAIR) 10 MG tablet TAKE 1 TABLET  BY MOUTH AT  BEDTIME 90 tablet 1   Multiple Vitamin (MULTI-VITAMINS) TABS Take 1 tablet twice a day     omeprazole (PRILOSEC) 40 MG capsule TAKE 1 CAPSULE BY MOUTH  DAILY 90 capsule 3   ONETOUCH DELICA LANCETS 54D MISC      ONETOUCH VERIO test strip      pilocarpine (SALAGEN) 5 MG tablet Take 5 mg by mouth in the morning, at noon, and at bedtime.     rosuvastatin (CRESTOR) 5 MG tablet TAKE 1 TABLET BY MOUTH DAILY 90 tablet 1   traZODone (DESYREL) 100 MG tablet Take 100 mg by mouth at bedtime.     Blood Glucose Monitoring Suppl (ONETOUCH VERIO IQ SYSTEM) W/DEVICE KIT 1 each by Does not apply route daily. 1 kit 0   Ferrous Sulfate (IRON SLOW RELEASE) 142 (45 Fe) MG TBCR Take 1 tablet by mouth daily. 30 tablet 5   No current facility-administered medications on file prior to visit.    Allergies  Allergen Reactions   Latex Itching    Physical exam:  Today's Vitals   01/16/22 1049  BP: 137/74  Pulse: 83  Weight: 220 lb (99.8 kg)  Height: 5' 11"  (1.803 m)   Body mass index is 30.68 kg/m.   Wt Readings from Last 3 Encounters:  01/16/22 220 lb (99.8 kg)  11/30/21 218 lb 12.8 oz (99.2 kg)  11/17/21 220 lb (99.8 kg)     Ht Readings from Last 3 Encounters:  01/16/22 5' 11"  (1.803 m)  11/17/21 5' 11"  (1.803 m)  09/25/21 5' 11"  (1.803 m)      General: The patient is awake, alert and appears not in acute distress, he appears in slow motion , soft voice, demure- . The patient is well groomed. Head: Normocephalic, atraumatic. Neck is supple. Mallampati 2-3/  with a glossy tongue, red mucosa.  ,  neck circumference:16 inches . Nasal airflow not patent.  Retrognathia is  seen.  Dental status: biological , poor dentition.  Cardiovascular:  Regular rate and cardiac rhythm by pulse,  without distended neck veins. Respiratory: Lungs are clear to auscultation.  Skin:  With evidence of ankle edema, oral thrush, glossitis.  Trunk: The patient's posture is erect.   Neurologic exam : The  patient is awake and alert, oriented to place and time.   Memory subjective described as intact.  Attention span & concentration ability appears normal.  Speech is fluent,  without dysarthria, but with dysphonia .  Mood and affect are depressed, demure.   Cranial nerves: no loss of smell or taste reported  Pupils are equal and briskly reactive to light. Funduscopic exam deferred.  Extraocular movements in vertical and horizontal planes were intact and without nystagmus. No Diplopia. Visual fields by finger perimetry are intact. Hearing was intact to soft voice and finger rubbing.    Facial sensation intact to fine touch.  Facial  motor strength is symmetric and tongue and uvula move midline.  Neck ROM : rotation, tilt and flexion extension were normal for age and shoulder shrug was symmetrical.    Motor exam:  Symmetric bulk, tone and ROM.   Normal tone without cog- wheeling, symmetric grip strength .   Sensory:  Fine touch, pinprick and vibration were tested  and  normal.  Proprioception tested in the upper extremities was normal.   Coordination: Rapid alternating movements in the fingers/hands were of normal speed.  The Finger-to-nose maneuver was intact without evidence of ataxia, dysmetria or tremor.   Gait and station: Patient could rise unassisted from a seated position, walked without assistive device.  Stance is of normal width/ base and the patient turned with 3.5 steps.  Toe and heel walk were deferred.  Deep tendon reflexes: in the  upper and lower extremities are symmetric and intact.  Babinski response was deferred.    reviewed CMET, HbA1c, Lipids.    After spending a total time of  45  minutes face to face and additional time for physical and neurologic examination, review of laboratory studies,  personal review of imaging studies, reports and results of other testing and review of referral information / records as far as provided in visit, I have established the  following assessments:  1) Mr. Bringhurst presents today after a successful weight loss, unintended.    OSA: He lost weight due to oral thrush oral pain and swallowing difficulties.  However his BMI is now 30.5 and he lost about 50 pounds over the last 12 to 18 months.  It is well possible that this weight loss has influenced not just his need for CPAP pressure but also how the mask fits.  At this time his 95th percentile air leak is 23.6 L a minute so I do think his facial structure has also changed with the weight loss and the mass is just not feeling as well.  He is using an older AirSense 10 CPAP that is about 64 years old now and his AHI is 4.0.  He had a moderate sleep apnea to begin with as quoted above.   The machine is set at 13 cm water without EPR.  In any case we need to see what his current baseline is and will have changed in the years since his last sleep study and also his next positive airway pressure therapy will likely be provided by an auto titration device with a pressure setting window.  So his body mass index was 46 when he had his last sleep study.  This may explain some of the aerophagia he currently experiences.  In addition he has a history of restless legs which may be related to his diabetes.  He also has chronic iron deficiency and he is s/p bariatric surgery in the year 2009 iron absorption is likely reduced by this, he presents with a glossitis a very smooth swollen tongue this may be a relation to iron deficiency as well.  Low iron storage and ferritin levels under 30 are strongly associated with restless legs.  I will order an iron study and if he is in spite of oral supplement still very deficient he may benefit from an infusion.    My Plan is to proceed with:  1)HST or PSG Split - AHI at 20/h.  2)Ferritin, TIBC, transferrin 3) trazodone can be used once he has weaned off pristiq- caveat serotonin syndrome.   I would like to thank Denita Lung,  MD  for allowing me  to meet with and to take care of this pleasant patient.   In short, [name] is presenting with possibly overtreated OSA , a symptom that can be attributed to CPAP use after weight loss. ,  I plan to follow up either personally or through our NP within 2-3 months.   CC: I will share my notes with PCP .  Electronically signed by: Larey Seat, MD 01/16/2022 11:12 AM  [facility] Board certified by The [employer] of Sleep Medicine and Diplomate of the [employer] of Sleep Medicine. Board certified In Neurology through the [employer], Fellow of the [employer] of Neurology. Medical Director of [facility].

## 2022-01-16 NOTE — Addendum Note (Signed)
Addended by: Melvyn Novas on: 01/16/2022 11:48 AM   Modules accepted: Orders

## 2022-01-16 NOTE — Patient Instructions (Addendum)
CPAP and BIPAP Information CPAP and BIPAP are methods that use air pressure to keep your airways open and to help you breathe well. CPAP and BIPAP use different amounts of pressure. Your health care provider will tell you whether CPAP or BIPAP would be more helpful for you. CPAP stands for "continuous positive airway pressure." With CPAP, the amount of pressure stays the same while you breathe in (inhale) and out (exhale). BIPAP stands for "bi-level positive airway pressure." With BIPAP, the amount of pressure will be higher when you inhale and lower when you exhale. This allows you to take larger breaths. CPAP or BIPAP may be used in the hospital, or your health care provider may want you to use it at home. You may need to have a sleep study before your health care provider can order a machine for you to use at home. What are the advantages? CPAP or BIPAP can be helpful if you have: Sleep apnea. Chronic obstructive pulmonary disease (COPD). Heart failure. Medical conditions that cause muscle weakness, including muscular dystrophy or amyotrophic lateral sclerosis (ALS). Other problems that cause breathing to be shallow, weak, abnormal, or difficult. CPAP and BIPAP are most commonly used for obstructive sleep apnea (OSA) to keep the airways from collapsing when the muscles relax during sleep. What are the risks? Generally, this is a safe treatment. However, problems may occur, including: Irritated skin or skin sores if the mask does not fit properly. Dry or stuffy nose or nosebleeds. Dry mouth. Feeling gassy or bloated. Sinus or lung infection if the equipment is not cleaned properly. When should CPAP or BIPAP be used? In most cases, the mask only needs to be worn during sleep. Generally, the mask needs to be worn throughout the night and during any daytime naps. People with certain medical conditions may also need to wear the mask at other times, such as when they are awake. Follow instructions  from your health care provider about when to use the machine. What happens during CPAP or BIPAP?  Both CPAP and BIPAP are provided by a small machine with a flexible plastic tube that attaches to a plastic mask that you wear. Air is blown through the mask into your nose or mouth. The amount of pressure that is used to blow the air can be adjusted on the machine. Your health care provider will set the pressure setting and help you find the best mask for you. Tips for using the mask Because the mask needs to be snug, some people feel trapped or closed-in (claustrophobic) when first using the mask. If you feel this way, you may need to get used to the mask. One way to do this is to hold the mask loosely over your nose or mouth and then gradually apply the mask more snugly. You can also gradually increase the amount of time that you use the mask. Masks are available in various types and sizes. If your mask does not fit well, talk with your health care provider about getting a different one. Some common types of masks include: Full face masks, which fit over the mouth and nose. Nasal masks, which fit over the nose. Nasal pillow or prong masks, which fit into the nostrils. If you are using a mask that fits over your nose and you tend to breathe through your mouth, a chin strap may be applied to help keep your mouth closed. Use a skin barrier to protect your skin as told by your health care provider. Some CPAP   and BIPAP machines have alarms that may sound if the mask comes off or develops a leak. If you have trouble with the mask, it is very important that you talk with your health care provider about finding a way to make the mask easier to tolerate. Do not stop using the mask. There could be a negative impact on your health if you stop using the mask. Tips for using the machine Place your CPAP or BIPAP machine on a secure table or stand near an electrical outlet. Know where the on/off switch is on the  machine. Follow instructions from your health care provider about how to set the pressure on your machine and when you should use it. Do not eat or drink while the CPAP or BIPAP machine is on. Food or fluids could get pushed into your lungs by the pressure of the CPAP or BIPAP. For home use, CPAP and BIPAP machines can be rented or purchased through home health care companies. Many different brands of machines are available. Renting a machine before purchasing may help you find out which particular machine works well for you. Your health insurance company may also decide which machine you may get. Keep the CPAP or BIPAP machine and attachments clean. Ask your health care provider for specific instructions. Check the humidifier if you have a dry stuffy nose or nosebleeds. Make sure it is working correctly. Follow these instructions at home: Take over-the-counter and prescription medicines only as told by your health care provider. Ask if you can take sinus medicine if your sinuses are blocked. Do not use any products that contain nicotine or tobacco. These products include cigarettes, chewing tobacco, and vaping devices, such as e-cigarettes. If you need help quitting, ask your health care provider. Keep all follow-up visits. This is important. Contact a health care provider if: You have redness or pressure sores on your head, face, mouth, or nose from the mask or head gear. You have trouble using the CPAP or BIPAP machine. You cannot tolerate wearing the CPAP or BIPAP mask. Someone tells you that you snore even when wearing your CPAP or BIPAP. Get help right away if: You have trouble breathing. You feel confused. Summary CPAP and BIPAP are methods that use air pressure to keep your airways open and to help you breathe well. If you have trouble with the mask, it is very important that you talk with your health care provider about finding a way to make the mask easier to tolerate. Do not stop using  the mask. There could be a negative impact to your health if you stop using the mask. Follow instructions from your health care provider about when to use the machine. This information is not intended to replace advice given to you by your health care provider. Make sure you discuss any questions you have with your health care provider. Document Revised: 01/25/2021 Document Reviewed: 05/27/2020 Elsevier Patient Education  [place]. Insomnia Insomnia is a sleep disorder that makes it difficult to fall asleep or stay asleep. Insomnia can cause fatigue, low energy, difficulty concentrating, mood swings, and poor performance at work or school. There are three different ways to classify insomnia: Difficulty falling asleep. Difficulty staying asleep. Waking up too early in the morning. Any type of insomnia can be long-term (chronic) or short-term (acute). Both are common. Short-term insomnia usually lasts for 3 months or less. Chronic insomnia occurs at least three times a week for longer than 3 months. What are the causes? Insomnia  may be caused by another condition, situation, or substance, such as: Having certain mental health conditions, such as anxiety and depression. Using caffeine, alcohol, tobacco, or drugs. Having gastrointestinal conditions, such as gastroesophageal reflux disease (GERD). Having certain medical conditions. These include: Asthma. Alzheimer's disease. Stroke. Chronic pain. An overactive thyroid gland (hyperthyroidism). Other sleep disorders, such as restless legs syndrome and sleep apnea. Menopause. Sometimes, the cause of insomnia may not be known. What increases the risk? Risk factors for insomnia include: Gender. Females are affected more often than males. Age. Insomnia is more common as people get older. Stress and certain medical and mental health conditions. Lack of exercise. Having an irregular work schedule. This may include working night shifts  and traveling between different time zones. What are the signs or symptoms? If you have insomnia, the main symptom is having trouble falling asleep or having trouble staying asleep. This may lead to other symptoms, such as: Feeling tired or having low energy. Feeling nervous about going to sleep. Not feeling rested in the morning. Having trouble concentrating. Feeling irritable, anxious, or depressed. How is this diagnosed? This condition may be diagnosed based on: Your symptoms and medical history. Your health care provider may ask about: Your sleep habits. Any medical conditions you have. Your mental health. A physical exam. How is this treated? Treatment for insomnia depends on the cause. Treatment may focus on treating an underlying condition that is causing the insomnia. Treatment may also include: Medicines to help you sleep. Counseling or therapy. Lifestyle adjustments to help you sleep better. Follow these instructions at home: Eating and drinking  Limit or avoid alcohol, caffeinated beverages, and products that contain nicotine and tobacco, especially close to bedtime. These can disrupt your sleep. Do not eat a large meal or eat spicy foods right before bedtime. This can lead to digestive discomfort that can make it hard for you to sleep. Sleep habits  Keep a sleep diary to help you and your health care provider figure out what could be causing your insomnia. Write down: When you sleep. When you wake up during the night. How well you sleep and how rested you feel the next day. Any side effects of medicines you are taking. What you eat and drink. Make your bedroom a dark, comfortable place where it is easy to fall asleep. Put up shades or blackout curtains to block light from outside. Use a white noise machine to block noise. Keep the temperature cool. Limit screen use before bedtime. This includes: Not watching TV. Not using your smartphone, tablet, or computer. Stick  to a routine that includes going to bed and waking up at the same times every day and night. This can help you fall asleep faster. Consider making a quiet activity, such as reading, part of your nighttime routine. Try to avoid taking naps during the day so that you sleep better at night. Get out of bed if you are still awake after 15 minutes of trying to sleep. Keep the lights down, but try reading or doing a quiet activity. When you feel sleepy, go back to bed. General instructions Take over-the-counter and prescription medicines only as told by your health care provider. Exercise regularly as told by your health care provider. However, avoid exercising in the hours right before bedtime. Use relaxation techniques to manage stress. Ask your health care provider to suggest some techniques that may work well for you. These may include: Breathing exercises. Routines to release muscle tension. Visualizing peaceful scenes. Make sure  that you drive carefully. Do not drive if you feel very sleepy. Keep all follow-up visits. This is important. Contact a health care provider if: You are tired throughout the day. You have trouble in your daily routine due to sleepiness. You continue to have sleep problems, or your sleep problems get worse. Get help right away if: You have thoughts about hurting yourself or someone else. Get help right away if you feel like you may hurt yourself or others, or have thoughts about taking your own life. Go to your nearest emergency room or: Call 911. Call the National Suicide Prevention Lifeline at (808)841-4330 or 988. This is open 24 hours a day. Text the Crisis Text Line at 608-226-2380. Summary Insomnia is a sleep disorder that makes it difficult to fall asleep or stay asleep. Insomnia can be long-term (chronic) or short-term (acute). Treatment for insomnia depends on the cause. Treatment may focus on treating an underlying condition that is causing the insomnia. Keep a  sleep diary to help you and your health care provider figure out what could be causing your insomnia. This information is not intended to replace advice given to you by your health care provider. Make sure you discuss any questions you have with your health care provider. Document Revised: 05/29/2021 Document Reviewed: 05/29/2021 Elsevier Patient Education  2023 [employer].

## 2022-01-17 ENCOUNTER — Telehealth: Payer: Self-pay | Admitting: Neurology

## 2022-01-17 ENCOUNTER — Encounter: Payer: Self-pay | Admitting: Neurology

## 2022-01-17 NOTE — Progress Notes (Signed)
Please have patient come to the [facility] office for iron iv - his levels are lower than a year ago- critically low and he was only taking oral supplements.  Iron level- very low, saturation critical low, ferritin 5 !!!!

## 2022-01-17 NOTE — Telephone Encounter (Signed)
Called the patient to review the lab results. There was no answer LVM for the pt to call back. Will sent a mychart message too

## 2022-01-17 NOTE — Telephone Encounter (Signed)
-----   Message from Melvyn Novas, MD sent at 01/17/2022 12:36 PM EDT ----- Please have patient come to the [facility] office for iron iv - his levels are lower than a year ago- critically low and he was only taking oral supplements.  Iron level- very low, saturation critical low, ferritin 5 !!!!

## 2022-01-20 LAB — IRON,TIBC AND FERRITIN PANEL
Ferritin: 5 ng/mL — ABNORMAL LOW (ref 30–400)
Iron Saturation: 3 % — CL (ref 15–55)
Iron: 13 ug/dL — ABNORMAL LOW (ref 38–169)
Total Iron Binding Capacity: 418 ug/dL (ref 250–450)
UIBC: 405 ug/dL — ABNORMAL HIGH (ref 111–343)

## 2022-01-20 LAB — TRANSFERRIN SATURATION
IRON SATN MFR SERPL: 3 % Saturation — ABNORMAL LOW
IRON SERPL-MCNC: 16 ug/dL — ABNORMAL LOW
TRANSFERRIN SERPL-MCNC: 338 mg/dL

## 2022-01-22 NOTE — Telephone Encounter (Signed)
Made a 2nd attempt to call the patient. There was no answer. LVM for pt to call back.

## 2022-01-22 NOTE — Progress Notes (Signed)
Needs iv treatment Iron

## 2022-01-23 ENCOUNTER — Telehealth: Payer: Self-pay | Admitting: Neurology

## 2022-01-23 NOTE — Telephone Encounter (Signed)
Called the patient and advised the patient of his labs and the iron levels being low. Advised that Dr Vickey Huger recommends an iron infusion to help with this. Pt agreeable to this. Advised the order will be given to infusion suite and he will get a call from them. He will then need to repeat labs 2 wks post infusion. Pt verbalized understanding.    "Please have patient come to the [facility] office for iron iv - his levels are lower than a year ago- critically low and he was only taking oral supplements.  Iron level- very low, saturation critical low, ferritin 5 !!!!"

## 2022-01-23 NOTE — Telephone Encounter (Signed)
Patient returned my call he is scheduled at [facility] for 02/06/22 at 10:30 AM.  I mailed packet to the patient.  He also wanted to know the results of his labs but he stated he goes back to class at 1:30.

## 2022-01-23 NOTE — Telephone Encounter (Signed)
spoke to patient he states he wcb to schedule  [employer] no auth req

## 2022-01-24 NOTE — Telephone Encounter (Signed)
Pt has already been advised yesterday that once infusion has insurance authorization they will be in contact. If the pt calls back asking, please provide the infusion suite number. [phone]

## 2022-02-03 ENCOUNTER — Encounter: Payer: Self-pay | Admitting: Neurology

## 2022-02-04 ENCOUNTER — Other Ambulatory Visit: Payer: Self-pay | Admitting: Surgical

## 2022-02-05 NOTE — Telephone Encounter (Signed)
I do not see this referral in his chart

## 2022-02-06 ENCOUNTER — Ambulatory Visit: Payer: 59 | Admitting: Neurology

## 2022-02-06 DIAGNOSIS — Z9884 Bariatric surgery status: Secondary | ICD-10-CM

## 2022-02-06 DIAGNOSIS — K140 Glossitis: Secondary | ICD-10-CM

## 2022-02-06 DIAGNOSIS — G4710 Hypersomnia, unspecified: Secondary | ICD-10-CM | POA: Diagnosis not present

## 2022-02-06 DIAGNOSIS — G2581 Restless legs syndrome: Secondary | ICD-10-CM

## 2022-02-06 DIAGNOSIS — Z9989 Dependence on other enabling machines and devices: Secondary | ICD-10-CM

## 2022-02-06 DIAGNOSIS — Z87898 Personal history of other specified conditions: Secondary | ICD-10-CM

## 2022-02-06 DIAGNOSIS — F341 Dysthymic disorder: Secondary | ICD-10-CM

## 2022-02-07 ENCOUNTER — Telehealth: Payer: Self-pay | Admitting: *Deleted

## 2022-02-07 DIAGNOSIS — E611 Iron deficiency: Secondary | ICD-10-CM

## 2022-02-07 NOTE — Telephone Encounter (Signed)
[facility] approved doing Venofer IC 100mg  for this particular pt. Order provided to them. Future lab order placed in epic.

## 2022-02-08 NOTE — Progress Notes (Signed)
       [facility]   HOME SLEEP TEST REPORT ( by Watch PAT)   STUDY DATE:  02-08-2022 DOB:  04/24/1956    ORDERING CLINICIAN: Carmen Dohmeier, MD  REFERRING CLINICIAN:    CLINICAL INFORMATION/HISTORY: [name] is a 66 y.o.  African American male patient seen here as a referral on 01/16/2022 from Dr Lalonde for a Sleep medicine consultation..   Dr Dohmeier for sleep concerns. He had a sleep study in 2016 at [place] , Dr Young.. He was set up with his last CPAP machine in 12/2014 . He has insomnia. He wakes up several times during the night  when he wears the cpap. He was seeing a counselor for anxiety/ insomnia who prescribed trazodone which helped.  He has had difficulties with hypersomnia, falling asleep while driving. In the process of weaning off pristiqe and wanting to start Trazodone again.     [name] has a past medical history of Anxiety, Arthritis, Bariatric Surgery in 2009, Candidiasis, mouth (03/22/2016), Carpal tunnel syndrome of right wrist (10/2014), Dental crowns present, Depression, Dyslipidemia, Ear fullness (08/30/2016), GERD (gastroesophageal reflux disease), Hypertension, Insulin dependent diabetes mellitus, Nummular eczema, history of Super-Obesity, Onychomycosis (08/30/2016), Oral; pain, Recurrent infections (03/22/2016),     Epworth sleepiness score: 15 /24. FSS score at 53/ 63 points, and Geriatric depression score at 8/ 15 points, followed by Dr Plovsky.   BMI: 30.9 kg/m   Neck Circumference: 16"   FINDINGS:   Sleep Summary:   Total Recording Time (hours, min):   7 hours 59 minutes  Total Sleep Time (hours, min):     5 hours 41 minutes            Percent REM (%):     16.4%                                   Respiratory Indices:   Calculated pAHI (per hour):    11.4/h                         REM pAHI:    14.8/h                                             NREM pAHI:    10.8/h                          Positional AHI: The  patient slept most of the night in nonsupine position, 134 minutes in the right lateral position with an AHI of 12/h, followed by 126 minutes in left lateral position with an AHI of 5.8/h and 82 minutes in supine sleep associated with an AHI of 19.3/h.  Snoring level mean volume 42 dB 50% of the recorded time were associated with snoring.                                                 Oxygen Saturation Statistics:       O2 Saturation Range (%):      Oxygen nadir was 86% maximum saturation 100%, mean oxygenation 96%                                   O2 Saturation (minutes) <89%:   0 minutes        Pulse Rate Statistics:     Pulse Range:    Between 49 and 95 bpm with a mean heart rate of 70 bpm             IMPRESSION:  This HST confirms the presence of mild sleep apnea REM accentuated but not dependent on REM sleep.  AHI was significantly lower when the patient slept in the left lateral position.   RECOMMENDATION: This patient's sleep apnea is now mild and could be treated with a dental device but also with simply avoiding supine sleep position and preferably sleeping on the left rather than right side.  If the patient feels that CPAP is helping him to sleep better through the night, I would order an auto titration CPAP device based on these results.  CPAP should be used in conjunction with trazodone.    INTERPRETING PHYSICIAN:   Carmen Dohmeier, MD   Medical Director of [facility].               

## 2022-02-15 NOTE — Procedures (Signed)
[facility] SLEEP TEST REPORT ( by Watch PAT)   STUDY DATE:  02-08-2022 DOB:  [birth year]    ORDERING CLINICIAN: Melvyn Novas, MD  REFERRING CLINICIAN:    CLINICAL INFORMATION/HISTORY: [name] is a 66 y.o.  African American male patient seen here as a referral on 01/16/2022 from Dr Susann Givens for a Sleep medicine consultation..   Dr [name] for sleep concerns. He had a sleep study in 2016 at [facility] , Dr Maple Hudson.Marland Kitchen He was set up with his last CPAP machine in 12/2014 . He has insomnia. He wakes up several times during the night  when he wears the cpap. He was seeing a counselor for anxiety/ insomnia who prescribed trazodone which helped.  He has had difficulties with hypersomnia, falling asleep while driving. In the process of weaning off pristiqe and wanting to start Trazodone again.     [name] has a past medical history of Anxiety, Arthritis, Bariatric Surgery in 2009, Candidiasis, mouth (03/22/2016), Carpal tunnel syndrome of right wrist (10/2014), Dental crowns present, Depression, Dyslipidemia, Ear fullness (08/30/2016), GERD (gastroesophageal reflux disease), Hypertension, Insulin dependent diabetes mellitus, Nummular eczema, history of Super-Obesity, Onychomycosis (08/30/2016), Oral; pain, Recurrent infections (03/22/2016),     Epworth sleepiness score: 15 /24. FSS score at 53/ 63 points, and Geriatric depression score at 8/ 15 points, followed by Dr Donell Beers.   BMI: 30.9 kg/m   Neck Circumference: 16"   FINDINGS:   Sleep Summary:   Total Recording Time (hours, min):   7 hours 59 minutes  Total Sleep Time (hours, min):     5 hours 41 minutes            Percent REM (%):     16.4%                                   Respiratory Indices:   Calculated pAHI (per hour):    11.4/h                         REM pAHI:    14.8/h                                             NREM pAHI:    10.8/h                          Positional AHI: The  patient slept most of the night in nonsupine position, 134 minutes in the right lateral position with an AHI of 12/h, followed by 126 minutes in left lateral position with an AHI of 5.8/h and 82 minutes in supine sleep associated with an AHI of 19.3/h.  Snoring level mean volume 42 dB 50% of the recorded time were associated with snoring.                                                 Oxygen Saturation Statistics:       O2 Saturation Range (%):      Oxygen nadir was 86% maximum saturation 100%, mean oxygenation 96%  O2 Saturation (minutes) <89%:   0 minutes        Pulse Rate Statistics:     Pulse Range:    Between 49 and 95 bpm with a mean heart rate of 70 bpm             IMPRESSION:  This HST confirms the presence of mild sleep apnea REM accentuated but not dependent on REM sleep.  AHI was significantly lower when the patient slept in the left lateral position.   RECOMMENDATION: This patient's sleep apnea is now mild and could be treated with a dental device but also with simply avoiding supine sleep position and preferably sleeping on the left rather than right side.  If the patient feels that CPAP is helping him to sleep better through the night, I would order an auto titration CPAP device based on these results.  CPAP should be used in conjunction with trazodone.    INTERPRETING PHYSICIAN:   Melvyn Novas, MD   Medical Director of [facility].

## 2022-02-15 NOTE — Addendum Note (Signed)
Addended by: Melvyn Novas on: 02/15/2022 07:03 PM   Modules accepted: Orders

## 2022-02-19 ENCOUNTER — Telehealth: Payer: Self-pay

## 2022-02-19 DIAGNOSIS — Z789 Other specified health status: Secondary | ICD-10-CM

## 2022-02-19 DIAGNOSIS — G4733 Obstructive sleep apnea (adult) (pediatric): Secondary | ICD-10-CM

## 2022-02-19 NOTE — Telephone Encounter (Signed)
-----   Message from Melvyn Novas, MD sent at 02/15/2022  7:03 PM EDT ----- This HST confirms the presence of mild sleep apnea REM accentuated but not dependent on REM sleep.  AHI was significantly lower when the patient slept in the left lateral position. This patient's sleep apnea is now mild and could be treated with a dental device but also with simply avoiding supine sleep position and preferably sleeping on the left rather than right side.  If the patient likes to continue using CPAP, I will order an auto titration device made by [employer], the patient can use the mask of his choice or can be fitted for 1.  The settings will be between 6 and 16 cmH2O pressure was 2 cm EPR, heated humidification.  A revisit will take place within 60 to 90 days of therapy start.

## 2022-02-19 NOTE — Telephone Encounter (Signed)
I called patient's home and mobile phone number to discuss sleep study results. No answer, no VM available at either number.  I will send patient a mychart message.

## 2022-02-19 NOTE — Telephone Encounter (Signed)
Pt called back, my chart message read to him, pt is open to a a dental device.  Pt also ok with RN responding back to him via my chart, pt will respond when he gets off work

## 2022-02-20 NOTE — Telephone Encounter (Signed)
I called patient to discuss. There was no answer and no VM available at both home and mobile number listed.

## 2022-02-25 ENCOUNTER — Other Ambulatory Visit: Payer: Self-pay | Admitting: Surgical

## 2022-02-27 ENCOUNTER — Encounter: Payer: Self-pay | Admitting: Family Medicine

## 2022-03-06 ENCOUNTER — Encounter: Payer: 59 | Admitting: Family Medicine

## 2022-03-07 ENCOUNTER — Encounter: Payer: Self-pay | Admitting: Internal Medicine

## 2022-03-08 NOTE — Addendum Note (Signed)
Addended by: Judi Cong on: 03/08/2022 09:59 AM   Modules accepted: Orders

## 2022-03-13 ENCOUNTER — Telehealth: Payer: Self-pay | Admitting: Neurology

## 2022-03-13 NOTE — Telephone Encounter (Signed)
Referral sent to [facility] [phone] 

## 2022-03-23 ENCOUNTER — Ambulatory Visit (HOSPITAL_COMMUNITY): Payer: Self-pay | Admitting: Psychiatry

## 2022-03-27 ENCOUNTER — Ambulatory Visit: Payer: 59

## 2022-03-27 ENCOUNTER — Encounter: Payer: Self-pay | Admitting: Podiatry

## 2022-03-27 ENCOUNTER — Ambulatory Visit: Payer: 59 | Admitting: Podiatry

## 2022-03-27 DIAGNOSIS — G5792 Unspecified mononeuropathy of left lower limb: Secondary | ICD-10-CM | POA: Diagnosis not present

## 2022-03-27 DIAGNOSIS — M778 Other enthesopathies, not elsewhere classified: Secondary | ICD-10-CM | POA: Diagnosis not present

## 2022-03-27 MED ORDER — TRIAMCINOLONE ACETONIDE 40 MG/ML IJ SUSP
20.0000 mg | Freq: Once | INTRAMUSCULAR | Status: AC
  Administered 2022-03-27: 20 mg

## 2022-03-27 NOTE — Progress Notes (Signed)
He presents today for follow-up of his left foot he states that he is got some numbness in times about a year in his forefoot pad left.  He denies any trauma.  He is also concerned about a little spot on the medial aspect of his third digit right foot.  Objective: Vital signs are stable alert and oriented x3.  Pulses are palpable.  Mild hammertoe deformities with a palpable Mulder's click to the third interdigital space of the left foot.  Otherwise no reproducible pain in the forefoot left.  Dermatologically the third digit of the right foot demonstrates a small nevus to the medial aspect of the tuft of the toe does not demonstrate any abnormal margins it is not raised color is normal and consistent.  Assessment: Benign nevus medial aspect third digit right foot.  Neuroma third interspace left foot.  Plan: I injected 10 mg Kenalog 5 mg Marcaine to the third interspace of the left foot.  Tolerated the procedure well and he will follow-up with [place] as needed.

## 2022-03-28 ENCOUNTER — Other Ambulatory Visit (INDEPENDENT_AMBULATORY_CARE_PROVIDER_SITE_OTHER): Payer: 59

## 2022-03-28 DIAGNOSIS — Z23 Encounter for immunization: Secondary | ICD-10-CM | POA: Diagnosis not present

## 2022-04-02 ENCOUNTER — Telehealth: Payer: Self-pay

## 2022-04-02 ENCOUNTER — Other Ambulatory Visit: Payer: Self-pay | Admitting: Surgical

## 2022-04-02 MED ORDER — ARIPIPRAZOLE 5 MG PO TABS: 5.0000 mg | ORAL_TABLET | Freq: Every day | ORAL | 0 refills | Status: DC

## 2022-04-02 NOTE — Telephone Encounter (Signed)
Pt. Called stating he wanted to know if you could refill his Aripiprazole 5 mg 1 daily. His psyc doctor left the office he goes to and his new psyc doctor there will not refill until he see's him in person. He doesn't have apt until 05/08/22 at [facility]. He uses [facility] [address].

## 2022-04-09 ENCOUNTER — Telehealth: Payer: Self-pay | Admitting: Family Medicine

## 2022-04-09 MED ORDER — TRAZODONE HCL 100 MG PO TABS: 100.0000 mg | ORAL_TABLET | Freq: Every day | ORAL | 1 refills | Status: DC

## 2022-04-09 NOTE — Telephone Encounter (Signed)
Pt called for refills of trazodone. Please send to [facility]. Pt can be reached at [phone].

## 2022-04-10 LAB — HEMOGLOBIN A1C: Hemoglobin A1C: 5.2

## 2022-04-11 ENCOUNTER — Telehealth: Payer: Self-pay

## 2022-04-11 MED ORDER — BUPROPION HCL ER (SR) 200 MG PO TB12
200.0000 mg | ORAL_TABLET | Freq: Two times a day (BID) | ORAL | 3 refills | Status: DC
Start: 2022-04-11 — End: 2022-05-08

## 2022-04-11 NOTE — Telephone Encounter (Signed)
Received fax from [facility] for refill on Bupropion last apt 11/30/21 next apt 05/23/22.

## 2022-04-18 ENCOUNTER — Encounter: Payer: Self-pay | Admitting: Adult Health

## 2022-04-18 ENCOUNTER — Ambulatory Visit: Payer: 59 | Admitting: Adult Health

## 2022-04-18 VITALS — BP 116/71 | HR 67 | Ht 71.0 in | Wt 207.6 lb

## 2022-04-18 DIAGNOSIS — E611 Iron deficiency: Secondary | ICD-10-CM

## 2022-04-18 DIAGNOSIS — G4733 Obstructive sleep apnea (adult) (pediatric): Secondary | ICD-10-CM | POA: Diagnosis not present

## 2022-04-18 DIAGNOSIS — G2581 Restless legs syndrome: Secondary | ICD-10-CM | POA: Diagnosis not present

## 2022-04-18 NOTE — Progress Notes (Signed)
[facility] [address] [place]. [place] [address] [phone]       OFFICE FOLLOW UP NOTE  [name] Date of Birth:  06/17/1956 Medical Record Number:  [id number]    Primary neurologist: Dr. Dohmeier Reason for visit: Sleep follow-up    SUBJECTIVE:   CHIEF COMPLAINT:  Chief Complaint  Patient presents with   CPAP FU    Rm 3, 3 month FU, alone  "I can't use CPAP due to chronic oral thrush and oral lichen planus, I don't have money to pay for oral device"    HPI:   Update 04/18/2022 JM: Patient returns for 3-month follow-up visit.  Completed HST 8/8 which showed overall mild sleep apnea with total AHI 11.4/h and recommended treatment of dental device and was referred to dentistry.  Unfortunately, he has not been able to obtain oral device due to financial reasons (cost $2500).  Reports he is unable to use CPAP due to chronic oral thrush and oral lichen planus.  He also previously complained of RLS symptoms and glossitis, with ferritin level 5, Dr. Dohmeier ordered IV iron infusion but was denied by insurance, he has since started on ferrous sulfate, initially tried to take twice daily but caused constipation and is currently only taking once daily which he is tolerating better.  Recent repeat ferritin levels 15 on 10/10. He has f/u with PCP next month with plans on repeating levels.  He does report RLS symptoms improving.  No new concerns at this time.     Consult visit 01/16/2022 Dr. Dohmeier: [name] is a 66 y.o.  African American male patient seen here as a referral on 01/16/2022 from Dr Lalonde for a Sleep medicine consultation..  Chief concern according to patient :  Presents today to re -establish care with Dr Dohmeier for sleep concerns. He had initial sleep study through Dr Dohmeier office and then last SS was in 2016. He was set up with a new machine 12/2014 through [facility]. He has insomnia. He wakes up several times  during the night. Worse when he wears the cpap. He was seeing a counselor who no longer practicing, they were prescribing trazodone. It helped.  He has had difficulties with falling asleep while driving. Memory concerns related to sleepiness, fatigue. In the process of weaning off pristiqe and wanting to start Trazodone again.     [name]  has a past medical history of Anxiety, Arthritis, Candidiasis, mouth (03/22/2016), Carpal tunnel syndrome of right wrist (10/2014), Dental crowns present, Depression, Dyslipidemia, Ear fullness (08/30/2016), GERD (gastroesophageal reflux disease), Hypertension, Insulin dependent diabetes mellitus, Nummular eczema, Obesity, Onychomycosis (08/30/2016), Recurrent infections (03/22/2016), and Sleep apnea.   The patient had the last sleep study in the year 2016 at [facility], with dr Young. He has used CPAP since then.  [facility] sleep study was a PSG,  with a result of an AHI ( Apnea Hypopnea index)  of 22.1/h, the vast majority of events were hypopneas 16.6/h and 5.5 was the AHI for apnea obstructive alone no central apneas.  There were also several spontaneous arousals and there were periodic limb movements noted.  He only had 1 minute of desaturation under 88%.  At the time of his sleep study he was fitted with a [facility] AirFit fullface mask and medium size.  And the technician had made a note that there were several PVCs and PACs.   Weight at that time( June 2016)  was 295 pounds with a neck   size of 17 inches.  He had gastric bypass after his first sleep study- he has incurable oral thrush, teeth loss, DM, swallowing difficulties, dry mouth. Lichen planus oralis.  Sleep relevant medical history:    Family medical /sleep history: brother  obese on CPAP with OSA. Social history:  Patient is working as Hotel manager, early Medical illustrator.  and lives in a household alone. Dog owner.  Tobacco use; quit 2009- with the gastric bypass. Marland Kitchen  ETOH use none ,  Caffeine intake in form of  Coffee( 2-4 cups) Soda( /) Tea ( /) or energy drinks. Regular exercise in form of walking..         Sleep habits are as follows: The patient's dinner time is between 5.30-6 PM. The patient goes to bed at 12-1 AM , RLS sometimes, leg cramps.  and continues to sleep intervals of for 2 hours- and wakes for 1-2 bathroom breaks.   CPAP now makes him feel as if he is suffocating, swallowing air.  The preferred sleep position is sideways, with the support of 1 pillow, on a flat mattress .  Dreams are reportedly frequent.  6  AM is the usual rise time. The patient wakes up with an alarm.  He/ She reports not feeling refreshed or restored in AM, with symptoms such as dry mouth and residual fatigue. Naps are not taken , but he sometimes a has sleep attacks,  lasting from 5 to 15 minutes and are  no more refreshing than nocturnal sleep.      ROS:   14 system review of systems performed and negative with exception of those listed in HPI  PMH:  Past Medical History:  Diagnosis Date   Anxiety    Arthritis    back, shoulders, left great toe   Candidiasis, mouth 03/22/2016   Carpal tunnel syndrome of right wrist 10/2014   Dental crowns present    Depression    Dyslipidemia    Ear fullness 08/30/2016   GERD (gastroesophageal reflux disease)    Hypertension    under control with med., per pt.; has been on med. x 15-20 yr.   Insulin dependent diabetes mellitus    Nummular eczema    Obesity    Onychomycosis 08/30/2016   Recurrent infections 03/22/2016   Sleep apnea    uses CPAP nightly    PSH:  Past Surgical History:  Procedure Laterality Date   CARDIAC CATHETERIZATION  12/30/2006   "mod. pulmonary HTN with preserved cardiac output and cardiac index"   CARPAL TUNNEL RELEASE Right 10/14/2014   Procedure: RIGHT CARPAL TUNNEL RELEASE;  Surgeon: Leanora Cover, MD;  Location: [facility];  Service: Orthopedics;  Laterality: Right;   CARPAL TUNNEL RELEASE Left 05/19/2015   Procedure: LEFT  CARPAL TUNNEL RELEASE;  Surgeon: Leanora Cover, MD;  Location: [facility];  Service: Orthopedics;  Laterality: Left;   CYST EXCISION     scalp and back   ESOPHAGOGASTRODUODENOSCOPY  03/25/2007   FOOT SURGERY Left    revision traumatic amputation of foot   ROUX-EN-Y PROCEDURE  07/28/2007   with lysis of adhesions    Social History:  Social History   Socioeconomic History   Marital status: Single    Spouse name: Not on file   Number of children: Not on file   Years of education: Not on file   Highest education level: Not on file  Occupational History   Not on file  Tobacco Use   Smoking status: Former  Packs/day: 1.00    Years: 33.00    Total pack years: 33.00    Types: Cigarettes    Start date: 31    Quit date: 07/03/2007    Years since quitting: 14.8   Smokeless tobacco: Never  Substance and Sexual Activity   Alcohol use: Yes    Comment: seldom   Drug use: No   Sexual activity: Not Currently  Other Topics Concern   Not on file  Social History Narrative   Not on file   Social Determinants of Health   Financial Resource Strain: Not on file  Food Insecurity: Not on file  Transportation Needs: Not on file  Physical Activity: Not on file  Stress: Not on file  Social Connections: Not on file  Intimate Partner Violence: Not on file    Family History:  Family History  Problem Relation Age of Onset   Heart disease Mother    Cancer Mother 49       Multiple myeloma   Diabetes Father    Diabetes Brother    Heart disease Brother 76       cardiomyopathy   Heart disease Brother 67       MI   Arthritis Paternal Grandmother    Diabetes Paternal Grandmother    Heart disease Paternal Grandmother    Stroke Paternal Grandmother     Medications:   Current Outpatient Medications on File Prior to Visit  Medication Sig Dispense Refill   acetaminophen (TYLENOL) 500 MG tablet Take 500 mg by mouth every 6 (six) hours as needed for mild pain.     ARIPiprazole  (ABILIFY) 5 MG tablet Take 1 tablet (5 mg total) by mouth daily. 90 tablet 0   ASPIRIN LOW DOSE 81 MG EC tablet TAKE 1 TABLET BY MOUTH AT  BEDTIME . SWALLOW WHOLE. 30 tablet 0   Azelastine HCl 137 MCG/SPRAY SOLN Place into both nostrils.     B Complex-C (B-COMPLEX WITH VITAMIN C) tablet Take 1 tablet by mouth 2 (two) times daily.     Blood Glucose Monitoring Suppl (ONETOUCH VERIO IQ SYSTEM) W/DEVICE KIT 1 each by Does not apply route daily. 1 kit 0   buPROPion (WELLBUTRIN SR) 200 MG 12 hr tablet Take 1 tablet (200 mg total) by mouth 2 (two) times daily. 180 tablet 3   calcium-vitamin D 250-100 MG-UNIT per tablet Take 1 tablet by mouth 2 (two) times daily.     celecoxib (CELEBREX) 100 MG capsule TAKE 1 CAPSULE BY MOUTH TWICE A DAY 60 capsule 0   cetirizine (ZYRTEC) 10 MG tablet Take 10 mg by mouth daily.     clobetasol (TEMOVATE) 0.05 % GEL Apply topically 2 (two) times daily.     Continuous Blood Gluc Sensor (FREESTYLE LIBRE 2 SENSOR) MISC Inject 1 sensor to the skin every 14 days for continuous glucose monitoring.     cyclobenzaprine (FLEXERIL) 10 MG tablet Take 1 tablet (10 mg total) by mouth at bedtime. 30 tablet 0   DENTA 5000 PLUS 1.1 % CREA dental cream Take by mouth.     desvenlafaxine (PRISTIQ) 50 MG 24 hr tablet Take 1 tablet (50 mg total) by mouth daily. 90 tablet 1   empagliflozin (JARDIANCE) 25 MG TABS tablet Take 25 mg by mouth daily.     Ferrous Sulfate (IRON SLOW RELEASE) 142 (45 Fe) MG TBCR Take 1 tablet by mouth daily. 30 tablet 5   fluconazole (DIFLUCAN) 150 MG tablet Take 150 mg by mouth daily.  fluticasone (FLONASE) 50 MCG/ACT nasal spray USE 1 SPRAY NASALLY ONCE A  DAY FOR ALLERGIES 32 g 1   glucose blood test strip USE TO CHECK SUGAR 4 TIMES  DAILY.     insulin glargine (LANTUS SOLOSTAR) 100 UNIT/ML Solostar Pen Inject 21 Units into the skin daily.     Insulin Pen Needle 32G X 4 MM MISC Use to inject insulin once a day or as directed     Insulin Syringe-Needle U-100  (INSULIN SYRINGE 1CC/31GX5/16") 31G X 5/16" 1 ML MISC Use as directed once daily ( Dx code E 11.65)     levocetirizine (XYZAL) 5 MG tablet Take 5 mg by mouth at bedtime.     lisinopril (ZESTRIL) 10 MG tablet Take 1 tablet (10 mg total) by mouth daily. 90 tablet 3   metFORMIN (GLUCOPHAGE) 1000 MG tablet Take 1,000 mg by mouth 2 (two) times daily with a meal.     montelukast (SINGULAIR) 10 MG tablet TAKE 1 TABLET BY MOUTH AT  BEDTIME 90 tablet 1   Multiple Vitamin (MULTI-VITAMINS) TABS Take 1 tablet twice a day     omeprazole (PRILOSEC) 40 MG capsule TAKE 1 CAPSULE BY MOUTH  DAILY 90 capsule 3   ONETOUCH DELICA LANCETS 33G MISC      ONETOUCH VERIO test strip      rosuvastatin (CRESTOR) 5 MG tablet TAKE 1 TABLET BY MOUTH DAILY 90 tablet 1   traZODone (DESYREL) 100 MG tablet Take 1 tablet (100 mg total) by mouth at bedtime. 90 tablet 1   No current facility-administered medications on file prior to visit.    Allergies:   Allergies  Allergen Reactions   Latex Itching      OBJECTIVE:  Physical Exam  Vitals:   04/18/22 1257  BP: 116/71  Pulse: 67  Weight: 207 lb 9.6 oz (94.2 kg)  Height: 5' 11" (1.803 m)   Body mass index is 28.95 kg/m. No results found.   General: well developed, well nourished, very pleasant middle-age male, seated, in no evident distress Head: head normocephalic and atraumatic.   Neck: supple with no carotid or supraclavicular bruits Cardiovascular: regular rate and rhythm, no murmurs Musculoskeletal: no deformity Skin:  no rash/petichiae Vascular:  Normal pulses all extremities   Neurologic Exam Mental Status: Awake and fully alert. Oriented to place and time. Recent and remote memory intact. Attention span, concentration and fund of knowledge appropriate. Mood and affect appropriate.  Cranial Nerves: Pupils equal, briskly reactive to light. Extraocular movements full without nystagmus. Visual fields full to confrontation. Hearing intact. Facial  sensation intact. Face, tongue, palate moves normally and symmetrically.  Motor: Normal bulk and tone. Normal strength in all tested extremity muscles Sensory.: intact to touch , pinprick , position and vibratory sensation.  Coordination: Rapid alternating movements normal in all extremities. Finger-to-nose and heel-to-shin performed accurately bilaterally. Gait and Station: Arises from chair without difficulty. Stance is normal. Gait demonstrates normal stride length and balance without use of AD.  Reflexes: 1+ and symmetric. Toes downgoing.         ASSESSMENT/PLAN: [name] is a 66 y.o. year old male    1.  Mild OSA  -Unable to obtain oral appliance due to financial reasons  -Not a candidate for CPAP due to chronic oral thrush and oral lichen planus  -Unfortunately, there are no other treatment options we can offer.  Did discuss avoiding supine sleep and laying on left side more so than right side per Dr. Dohmeier's recommendations     2.  RLS  -Suspect in setting of iron deficiency as symptoms have been improving with iron supplementation  -Request repeat iron levels next month with PCP, will defer further treatment to PCP    No further recommendations at this time.  Can follow-up on an as-needed basis      CC:  PCP: Denita Lung, MD    I spent 24 minutes of face-to-face and non-face-to-face time with patient.  This included previsit chart review, lab review, study review, order entry, electronic health record documentation, patient education regarding above diagnoses and treatment plan and answered all questions to patient satisfaction   Frann Rider, [facility]  [facility] [address] [place], [place] [address]  Phone [phone] Fax [phone] Note: This document was prepared with digital dictation and possible smart phrase technology. Any transcriptional errors that result from this process are  unintentional.

## 2022-04-23 ENCOUNTER — Encounter: Payer: Self-pay | Admitting: Internal Medicine

## 2022-04-26 ENCOUNTER — Telehealth: Payer: Self-pay | Admitting: Family Medicine

## 2022-04-26 NOTE — Telephone Encounter (Signed)
We have received FMLA forms.  Called pt reach vm.  Not sure why we have forms.  We haven't discussed taking him out of work.  He is being cared for by Ortho.  It appears these forms need to go to Ortho.  Advised pt to ask for Adam on vm.

## 2022-04-26 NOTE — Telephone Encounter (Signed)
FMLA papers received put in [place] folder

## 2022-04-27 NOTE — Telephone Encounter (Signed)
Pt called and got Diana's vm, he asks for Adam to give him a call but he also told me he doesn't need to be out due to being sick, he just has so many doctor appointments to go to and also he stated they wanted his primary doctor to fill out the FMLA papers and not the ortho doctor. Best number to reach him today is [phone]

## 2022-05-07 ENCOUNTER — Encounter: Payer: Self-pay | Admitting: Family Medicine

## 2022-05-07 ENCOUNTER — Ambulatory Visit: Payer: 59 | Admitting: Family Medicine

## 2022-05-07 VITALS — BP 120/72 | HR 62 | Temp 98.5°F | Wt 207.4 lb

## 2022-05-07 DIAGNOSIS — Z719 Counseling, unspecified: Secondary | ICD-10-CM

## 2022-05-07 NOTE — Progress Notes (Signed)
   Subjective:    Patient ID: [name], male    DOB: Nov 16, 1955, 66 y.o.   MRN: [id number]  HPI He is here for consult concerning using FMLA to get intermittent time off for various doctors appointments.  He cites follow-up with his psychiatrist, dentist, orthopedic surgeons, oral surgeons.  I reviewed this information with him and the fact that the FMLA requires that he have a serious health condition, none of these would really qualify as serious health conditions as a.  Appointments are all for routine follow-up.  His HR was the ones wanted him to get this filled out.   Review of Systems     Objective:   Physical Exam Alert and in no distress otherwise not examined       Assessment & Plan:  Encounter for consultation I explained that none of the appointments that he listed would really qualify as a serious health condition.  Explained that these are all routine kinds of appointments that he should be able to use his sick time as well as vacation time and prior to arrange them all in the pattern that would require less time off from work.

## 2022-05-08 ENCOUNTER — Ambulatory Visit (HOSPITAL_COMMUNITY): Payer: 59 | Admitting: Psychiatry

## 2022-05-08 DIAGNOSIS — F321 Major depressive disorder, single episode, moderate: Secondary | ICD-10-CM

## 2022-05-08 MED ORDER — BUPROPION HCL ER (SR) 200 MG PO TB12
200.0000 mg | ORAL_TABLET | Freq: Two times a day (BID) | ORAL | 3 refills | Status: DC
Start: 2022-05-08 — End: 2022-07-06

## 2022-05-08 MED ORDER — DESVENLAFAXINE SUCCINATE ER 50 MG PO TB24: 50.0000 mg | ORAL_TABLET | Freq: Every day | ORAL | 1 refills | Status: DC

## 2022-05-08 NOTE — Progress Notes (Signed)
Psychiatric Initial Adult Assessment   Patient Identification: [name] MRN:  [id number] Date of Evaluation:  05/08/2022 Referral Source: Dr. Asencion Partridge Dohmeier Chief Complaint: Depression Visit Diagnosis: Major depression recurrent  History of Present Illness:    This patient is in 66 year old African-American male is being seen for progressive depression.  His previous psychiatric provider is no longer available.  He was recently started on Abilify 5 mg with no benefits.  This gentleman is gay but presently is in no relationships.  In fact he says he has not been in a relationship for over 25 years.  Has never been married and has no children.  His significant stressor is his brother who was very ill.  He talks to him nearly every day.  His brother has heart and kidney problems and is obese.  This patient works 40 hours a week at [employer].  He dislikes what he does for a living and dislikes the people he works with.  He has had some great difficulties with the supervisors as he claims that in trying to terminate him without success.  Financially it is not that well of a paying job.  Nonetheless for a variety of reasons he stays at.  The patient feels persistent daily depression which is worse in the last 3 weeks.  His sleep is hard to assess.  He has sleep apnea and is having some adjustments made with his care.  He is eating well has good energy and denies anhedonia.  He likes watching TV has a dog and gets on the computer for enjoyment.  He has no problems thinking and concentrating and he denies a sense of worthlessness.  He does acknowledge that he has a decrease in self-esteem.  Has never been suicidal and is not now.  He denies use of alcohol or drugs.  He denies symptoms of mania in the past.  His previous providers have diagnosed him with major depression and treated him as such.  He has no clear evidence of mania by his history.  Symptoms are inconsistent with  generalized anxiety disorder or panic disorder or obsessive-compulsive disorder. Patient has significant medical problems.  He has had gastric bypass surgery.  He is insulin-dependent diabetic.  He has carpal tunnel disease.  He had an issue with his rotator cuff as well. This past psychiatric history is that he has never been in a psychiatric hospital.  Presently takes 5 mg of Abilify, 50 mg of Pristiq Wellbutrin 200 mg slow release twice daily.  He also takes trazodone 100 mg.  Associated Signs/Symptoms: Depression Symptoms:   (Hypo) Manic Symptoms:   Anxiety Symptoms:   Psychotic Symptoms:   PTSD Symptoms: NA  Past Psychiatric History:   Previous Psychotropic Medications: Yes   Substance Abuse History in the last 12 months:  No.  Consequences of Substance Abuse: NA  Past Medical History:  Past Medical History:  Diagnosis Date   Anxiety    Arthritis    back, shoulders, left great toe   Candidiasis, mouth 03/22/2016   Carpal tunnel syndrome of right wrist 10/2014   Dental crowns present    Depression    Dyslipidemia    Ear fullness 08/30/2016   GERD (gastroesophageal reflux disease)    Hypertension    under control with med., per pt.; has been on med. x 15-20 yr.   Insulin dependent diabetes mellitus    Nummular eczema    Obesity    Onychomycosis 08/30/2016   Recurrent infections 03/22/2016  Sleep apnea    uses CPAP nightly    Past Surgical History:  Procedure Laterality Date   CARDIAC CATHETERIZATION  12/30/2006   "mod. pulmonary HTN with preserved cardiac output and cardiac index"   CARPAL TUNNEL RELEASE Right 10/14/2014   Procedure: RIGHT CARPAL TUNNEL RELEASE;  Surgeon: Leanora Cover, MD;  Location: [place];  Service: Orthopedics;  Laterality: Right;   CARPAL TUNNEL RELEASE Left 05/19/2015   Procedure: LEFT CARPAL TUNNEL RELEASE;  Surgeon: Leanora Cover, MD;  Location: [place];  Service: Orthopedics;  Laterality: Left;   CYST EXCISION      scalp and back   ESOPHAGOGASTRODUODENOSCOPY  03/25/2007   FOOT SURGERY Left    revision traumatic amputation of foot   ROUX-EN-Y PROCEDURE  07/28/2007   with lysis of adhesions    Family Psychiatric History: Family History:  Family History  Problem Relation Age of Onset   Heart disease Mother    Cancer Mother 22       Multiple myeloma   Diabetes Father    Diabetes Brother    Heart disease Brother 51       cardiomyopathy   Heart disease Brother 34       MI   Arthritis Paternal Grandmother    Diabetes Paternal Grandmother    Heart disease Paternal Grandmother    Stroke Paternal Grandmother     Social History:   Social History   Socioeconomic History   Marital status: Single    Spouse name: Not on file   Number of children: Not on file   Years of education: Not on file   Highest education level: Not on file  Occupational History   Not on file  Tobacco Use   Smoking status: Former    Packs/day: 1.00    Years: 33.00    Total pack years: 33.00    Types: Cigarettes    Start date: 31    Quit date: 07/03/2007    Years since quitting: 14.8   Smokeless tobacco: Never  Substance and Sexual Activity   Alcohol use: Yes    Comment: seldom   Drug use: No   Sexual activity: Not Currently  Other Topics Concern   Not on file  Social History Narrative   Not on file   Social Determinants of Health   Financial Resource Strain: Not on file  Food Insecurity: Not on file  Transportation Needs: Not on file  Physical Activity: Not on file  Stress: Not on file  Social Connections: Not on file    Additional Social History:   Allergies:   Allergies  Allergen Reactions   Latex Itching    Metabolic Disorder Labs: Lab Results  Component Value Date   HGBA1C 6.6 11/17/2021   No results found for: "PROLACTIN" Lab Results  Component Value Date   CHOL 101 11/07/2020   TRIG 70 11/07/2020   HDL 40 11/07/2020   CHOLHDL 2.5 11/07/2020   VLDL 17 11/13/2010   LDLCALC 46  11/07/2020   LDLCALC 60 11/13/2010     Therapeutic Level Labs: No results found for: "LITHIUM" No results found for: "CBMZ" No results found for: "VALPROATE"  Current Medications: Current Outpatient Medications  Medication Sig Dispense Refill   acetaminophen (TYLENOL) 500 MG tablet Take 500 mg by mouth every 6 (six) hours as needed for mild pain.     ARIPiprazole (ABILIFY) 5 MG tablet Take 1 tablet (5 mg total) by mouth daily. 90 tablet 0   ASPIRIN LOW  DOSE 81 MG EC tablet TAKE 1 TABLET BY MOUTH AT  BEDTIME . SWALLOW WHOLE. 30 tablet 0   Azelastine HCl 137 MCG/SPRAY SOLN Place into both nostrils.     B Complex-C (B-COMPLEX WITH VITAMIN C) tablet Take 1 tablet by mouth 2 (two) times daily.     Blood Glucose Monitoring Suppl (ONETOUCH VERIO IQ SYSTEM) W/DEVICE KIT 1 each by Does not apply route daily. 1 kit 0   buPROPion (WELLBUTRIN SR) 200 MG 12 hr tablet Take 1 tablet (200 mg total) by mouth 2 (two) times daily. 180 tablet 3   calcium-vitamin D 250-100 MG-UNIT per tablet Take 1 tablet by mouth 2 (two) times daily.     celecoxib (CELEBREX) 100 MG capsule TAKE 1 CAPSULE BY MOUTH TWICE A DAY 60 capsule 0   cetirizine (ZYRTEC) 10 MG tablet Take 10 mg by mouth daily.     clobetasol (TEMOVATE) 0.05 % GEL Apply topically 2 (two) times daily.     Continuous Blood Gluc Sensor (FREESTYLE LIBRE 2 SENSOR) MISC Inject 1 sensor to the skin every 14 days for continuous glucose monitoring.     cyclobenzaprine (FLEXERIL) 10 MG tablet Take 1 tablet (10 mg total) by mouth at bedtime. (Patient not taking: Reported on 05/07/2022) 30 tablet 0   DENTA 5000 PLUS 1.1 % CREA dental cream Take by mouth.     desvenlafaxine (PRISTIQ) 50 MG 24 hr tablet Take 1 tablet (50 mg total) by mouth daily. 90 tablet 1   empagliflozin (JARDIANCE) 25 MG TABS tablet Take 25 mg by mouth daily.     Ferrous Sulfate (IRON SLOW RELEASE) 142 (45 Fe) MG TBCR Take 1 tablet by mouth daily. 30 tablet 5   fluconazole (DIFLUCAN) 150 MG  tablet Take 150 mg by mouth daily. (Patient not taking: Reported on 05/07/2022)     fluticasone (FLONASE) 50 MCG/ACT nasal spray USE 1 SPRAY NASALLY ONCE A  DAY FOR ALLERGIES 32 g 1   glucose blood test strip USE TO CHECK SUGAR 4 TIMES  DAILY.     insulin glargine (LANTUS SOLOSTAR) 100 UNIT/ML Solostar Pen Inject 21 Units into the skin daily.     Insulin Pen Needle 32G X 4 MM MISC Use to inject insulin once a day or as directed     Insulin Syringe-Needle U-100 (INSULIN SYRINGE 1CC/31GX5/16") 31G X 5/16" 1 ML MISC Use as directed once daily ( Dx code E 11.65)     levocetirizine (XYZAL) 5 MG tablet Take 5 mg by mouth at bedtime.     lisinopril (ZESTRIL) 10 MG tablet Take 1 tablet (10 mg total) by mouth daily. 90 tablet 3   metFORMIN (GLUCOPHAGE) 1000 MG tablet Take 1,000 mg by mouth 2 (two) times daily with a meal.     montelukast (SINGULAIR) 10 MG tablet TAKE 1 TABLET BY MOUTH AT  BEDTIME 90 tablet 1   Multiple Vitamin (MULTI-VITAMINS) TABS Take 1 tablet twice a day     omeprazole (PRILOSEC) 40 MG capsule TAKE 1 CAPSULE BY MOUTH  DAILY 90 capsule 3   ONETOUCH DELICA LANCETS 19J MISC      ONETOUCH VERIO test strip      rosuvastatin (CRESTOR) 5 MG tablet TAKE 1 TABLET BY MOUTH DAILY 90 tablet 1   traZODone (DESYREL) 100 MG tablet Take 1 tablet (100 mg total) by mouth at bedtime. 90 tablet 1   No current facility-administered medications for this visit.    Musculoskeletal: Strength & Muscle Tone: within normal limits Gait &  Station: normal Patient leans: Left  Psychiatric Specialty Exam: Review of Systems  There were no vitals taken for this visit.There is no height or weight on file to calculate BMI.  General Appearance: Casual  Eye Contact:  Good  Speech:  NA  Volume:  Normal  Mood:  Dysphoric  Affect:  NA  Thought Process:  Coherent  Orientation:    Thought Content:  Logical  Suicidal Thoughts:  No  Homicidal Thoughts:  No  Memory:  NA  Judgement:  Good  Insight:     Psychomotor Activity:  NA  Concentration:    Recall:  Good  Fund of Knowledge:Good  Language: Good  Akathisia:    Handed:  Right  AIMS (if indicated):  not done  Assets:  Desire for Improvement  ADL's:  Intact  Cognition: WNL  Sleep:  Good   Screenings: PHQ2-9    [place] Office Visit from 11/07/2020 in [place] Visit from 09/15/2020 in [place] Visit from 02/15/2017 in [place] Office Visit from 08/30/2016 in [facility] Office Visit from 05/21/2016 in [facility]  PHQ-2 Total Score _0 0 2  PHQ-9 Total Score 13 12 -- -- 5       Assessment and Plan:   This patient has a long history of psychiatric care.  He was under the care of Dr. Carlis Abbott in the distant past.  Recently has seen a nurse practitioner.  His diagnosis is not completely clear to me.  I suspect he has a chronic depression disorder perhaps dysthymic disorder.  He has really few vegetative symptoms.  Today we are going to discontinue his Abilify.  We are going to adjust his Pristiq.  He was taking 50 mg 2 times a week.  We asked him to go back to taking Pristiq 50 mg every day.  He will continue Wellbutrin slow release 200 mg twice daily.  He will continue trazodone 100 mg.  Today we will make an effort to try to find him a good therapist.  He will return to see me in approximately 2 months.  Patient is not suicidal.  He is actually functioning reasonably well.  Collaboration of Care:   Patient/Guardian was advised Release of Information must be obtained prior to any record release in order to collaborate their care with an outside provider. Patient/Guardian was advised if they have not already done so to contact the registration department to sign all necessary forms in order for [place] to release information regarding their care.   Consent: Patient/Guardian gives verbal consent for  treatment and assignment of benefits for services provided during this visit. Patient/Guardian expressed understanding and agreed to proceed.   Jerral Ralph, MD 11/7/20234:35 PM

## 2022-05-11 ENCOUNTER — Other Ambulatory Visit: Payer: Self-pay | Admitting: Surgical

## 2022-05-11 NOTE — Telephone Encounter (Signed)
Appt was scheduled for this

## 2022-05-17 ENCOUNTER — Telehealth: Payer: Self-pay | Admitting: Orthopedic Surgery

## 2022-05-17 NOTE — Telephone Encounter (Signed)
Patient needs his Celebrex refilled and sent to [facility]. Best number to contact pt [phone]

## 2022-05-20 ENCOUNTER — Other Ambulatory Visit: Payer: Self-pay | Admitting: Surgical

## 2022-05-20 MED ORDER — CELECOXIB 100 MG PO CAPS: 100.0000 mg | ORAL_CAPSULE | Freq: Two times a day (BID) | ORAL | 0 refills | Status: DC

## 2022-05-20 NOTE — Telephone Encounter (Signed)
Sent in RX for mail order

## 2022-05-23 ENCOUNTER — Encounter: Payer: 59 | Admitting: Family Medicine

## 2022-06-10 ENCOUNTER — Other Ambulatory Visit: Payer: Self-pay | Admitting: Family Medicine

## 2022-06-10 DIAGNOSIS — J309 Allergic rhinitis, unspecified: Secondary | ICD-10-CM

## 2022-06-12 ENCOUNTER — Ambulatory Visit (HOSPITAL_COMMUNITY): Payer: 59 | Admitting: Psychiatry

## 2022-06-15 ENCOUNTER — Ambulatory Visit: Payer: 59 | Admitting: Family Medicine

## 2022-06-15 ENCOUNTER — Encounter: Payer: Self-pay | Admitting: Family Medicine

## 2022-06-15 VITALS — BP 106/60 | HR 74 | Wt 198.4 lb

## 2022-06-15 DIAGNOSIS — I6523 Occlusion and stenosis of bilateral carotid arteries: Secondary | ICD-10-CM | POA: Diagnosis not present

## 2022-06-15 DIAGNOSIS — I7090 Unspecified atherosclerosis: Secondary | ICD-10-CM

## 2022-06-15 NOTE — Progress Notes (Signed)
   Subjective:    Patient ID: [name], male    DOB: 07/08/1955, 66 y.o.   MRN: [id number]  HPI He is here for consult concerning arterial calcifications.  He is in the process of getting evaluated by his dentist for a dental appliance.  Recent scanning did show evidence of extensive internal carotid calcification.  Presently he is on Crestor.  He has had no weakness, numbness, tingling, unilateral neurologic issues.   Review of Systems     Objective:   Physical Exam Alert and in no distress.  No carotid bruits noted.       Assessment & Plan:  Carotid artery calcification, bilateral - Plan: US Carotid Bilateral  Atherosclerosis I explained that now he has atherosclerosis which is another reason to continue his Crestor.  I will do carotid Dopplers to further evaluate the extent of the calcifications. At the end of the encounter he mentioned having jock itch.  I recommended that he use Lamisil regularly for 3 or 4 weeks and then back off to use it as needed.

## 2022-06-18 ENCOUNTER — Encounter: Payer: Self-pay | Admitting: Family Medicine

## 2022-06-27 ENCOUNTER — Ambulatory Visit (HOSPITAL_COMMUNITY): Payer: 59 | Admitting: Psychiatry

## 2022-07-03 ENCOUNTER — Encounter: Payer: Self-pay | Admitting: Family Medicine

## 2022-07-03 ENCOUNTER — Ambulatory Visit: Payer: 59 | Admitting: Family Medicine

## 2022-07-03 VITALS — BP 110/60 | HR 74 | Temp 98.4°F | Resp 14 | Wt 195.2 lb

## 2022-07-03 DIAGNOSIS — I499 Cardiac arrhythmia, unspecified: Secondary | ICD-10-CM | POA: Diagnosis not present

## 2022-07-03 DIAGNOSIS — R634 Abnormal weight loss: Secondary | ICD-10-CM | POA: Diagnosis not present

## 2022-07-03 DIAGNOSIS — R110 Nausea: Secondary | ICD-10-CM

## 2022-07-03 LAB — CBC WITH DIFFERENTIAL/PLATELET

## 2022-07-03 NOTE — Progress Notes (Signed)
   Subjective:    Patient ID: [name], male    DOB: 25-Mar-1956, 67 y.o.   MRN: [id number]  HPI He has had difficulty with nausea for the last 3 weeks that is intermittent in nature.  He cannot relate this to any particular foods.  He has had no vomiting or diarrhea.  Over the last several months he has made an effort to lose weight by increasing exercising and decreasing his food intake.  He is been doing this with no particular regimen.  Also he noted earlier today when he had an episode of nausea he checked his pulse in the carotid area and noted it to be irregular however this only lasted for several seconds and then went away.   Review of Systems     Objective:   Physical Exam Alert and in no distress. Tympanic membranes and canals are normal. Pharyngeal area is normal. Neck is supple without adenopathy or thyromegaly. Cardiac exam shows a regular sinus rhythm without murmurs or gallops. Lungs are clear to auscultation.        Assessment & Plan:  Nausea - Plan: CBC with Differential/Platelet, Comprehensive metabolic panel  Weight loss - Plan: CBC with Differential/Platelet, Comprehensive metabolic panel  Cardiac arrhythmia, unspecified cardiac arrhythmia type Recommend he use Emetrol to help with the nausea.  Explained that at the present time difficult to say what the nausea is coming from.  I congratulated him on the weight loss but also indicated that if done too rapid to race with too much calorie restriction, that can cause trouble. I then discussed the arrhythmia and since this is the only time this is occurred, at this point watchful waiting would be the appropriate way.  He was okay with that.

## 2022-07-04 ENCOUNTER — Encounter: Payer: Self-pay | Admitting: Family Medicine

## 2022-07-04 ENCOUNTER — Ambulatory Visit
Admission: RE | Admit: 2022-07-04 | Discharge: 2022-07-04 | Disposition: A | Discharge status: Home / Self Care | Payer: 59 | Source: Ambulatory Visit | Attending: Family Medicine | Admitting: Family Medicine

## 2022-07-04 LAB — CBC WITH DIFFERENTIAL/PLATELET
Basophils Absolute: 0.1 10*3/uL (ref 0.0–0.2)
Basos: 1 %
EOS (ABSOLUTE): 0.1 10*3/uL (ref 0.0–0.4)
Eos: 1 %
Hematocrit: 41.1 % (ref 37.5–51.0)
Hemoglobin: 14 g/dL (ref 13.0–17.7)
Immature Grans (Abs): 0 10*3/uL (ref 0.0–0.1)
Immature Granulocytes: 0 %
Lymphocytes Absolute: 2 10*3/uL (ref 0.7–3.1)
Lymphs: 24 %
MCH: 29.7 pg (ref 26.6–33.0)
MCHC: 34.1 g/dL (ref 31.5–35.7)
MCV: 87 fL (ref 79–97)
Monocytes Absolute: 0.6 10*3/uL (ref 0.1–0.9)
Monocytes: 7 %
Neutrophils Absolute: 5.5 10*3/uL (ref 1.4–7.0)
Neutrophils: 67 %
Platelets: 267 10*3/uL (ref 150–450)
RBC: 4.72 x10E6/uL (ref 4.14–5.80)
RDW: 17.4 % — ABNORMAL HIGH (ref 11.6–15.4)
WBC: 8.2 10*3/uL (ref 3.4–10.8)

## 2022-07-04 LAB — COMPREHENSIVE METABOLIC PANEL
ALT: 15 IU/L (ref 0–44)
AST: 19 IU/L (ref 0–40)
Albumin/Globulin Ratio: 2.3 — ABNORMAL HIGH (ref 1.2–2.2)
Albumin: 4.4 g/dL (ref 3.9–4.9)
Alkaline Phosphatase: 68 IU/L (ref 44–121)
BUN/Creatinine Ratio: 19 (ref 10–24)
BUN: 15 mg/dL (ref 8–27)
Bilirubin Total: 0.4 mg/dL (ref 0.0–1.2)
CO2: 24 mmol/L (ref 20–29)
Calcium: 9.8 mg/dL (ref 8.6–10.2)
Chloride: 105 mmol/L (ref 96–106)
Creatinine, Ser: 0.78 mg/dL (ref 0.76–1.27)
Globulin, Total: 1.9 g/dL (ref 1.5–4.5)
Glucose: 116 mg/dL — ABNORMAL HIGH (ref 70–99)
Potassium: 4.6 mmol/L (ref 3.5–5.2)
Sodium: 141 mmol/L (ref 134–144)
Total Protein: 6.3 g/dL (ref 6.0–8.5)
eGFR: 100 mL/min/{1.73_m2} (ref >59)

## 2022-07-05 LAB — HM DIABETES EYE EXAM

## 2022-07-06 ENCOUNTER — Ambulatory Visit (HOSPITAL_BASED_OUTPATIENT_CLINIC_OR_DEPARTMENT_OTHER): Payer: 59 | Admitting: Psychiatry

## 2022-07-06 DIAGNOSIS — F325 Major depressive disorder, single episode, in full remission: Secondary | ICD-10-CM | POA: Diagnosis not present

## 2022-07-06 MED ORDER — BUPROPION HCL ER (SR) 200 MG PO TB12: 200.0000 mg | ORAL_TABLET | Freq: Two times a day (BID) | ORAL | 3 refills | Status: DC

## 2022-07-06 MED ORDER — DESVENLAFAXINE SUCCINATE ER 50 MG PO TB24: 50.0000 mg | ORAL_TABLET | Freq: Every day | ORAL | 1 refills | Status: DC

## 2022-07-06 MED ORDER — TRAZODONE HCL 100 MG PO TABS: 100.0000 mg | ORAL_TABLET | Freq: Every day | ORAL | 1 refills | Status: DC

## 2022-07-06 NOTE — Progress Notes (Signed)
Psychiatric Initial Adult Assessment   Patient Identification: [name] MRN:  [id number] Date of Evaluation:  07/06/2022 Referral Source: Dr. Porfirio Mylar Dohmeier Chief Complaint: Depression Visit Diagnosis: Major depression recurrent  History of Present Illness:     Today the patient is seen in the office.  Patient is doing well.  He seems significantly improved.  He had a good holiday.  He is almost few family members particularly his brother who has multiple medical illnesses.  His brother seems to be somewhat stable.  The patient denies daily depression.  He is sleeping fairly well.  He made an adjustment when his CPAP machine.  What is curious is the patient has lost 15 pounds in 3 months.  His primary care doctor is aware of this.  He claims his appetite is fairly good.  He has got a reasonable amount of energy.  Work is unchanged.  The patient enjoys reading watching TV and spending time with his 33-year-old rescue dog.  The patient unfortunately is stressed a lot by financial demands.  He needs to have dental surgery, but no air conditioner and is paying for a new mattress.  His biggest stress is his medical bills.  The patient used to be under the care of Dr.Kaur.  Due to insurance issues he had an care with her.  The patient used to be seeing a therapist as well but no longer.  The patient likes the home he is living in.  The patient has no romantic relationships at this time.  The patient seems to be improved.  Associated Signs/Symptoms: Depression Symptoms:   (Hypo) Manic Symptoms:   Anxiety Symptoms:   Psychotic Symptoms:   PTSD Symptoms: NA  Past Psychiatric History:   Previous Psychotropic Medications: Yes   Substance Abuse History in the last 12 months:  No.  Consequences of Substance Abuse: NA  Past Medical History:  Past Medical History:  Diagnosis Date   Anxiety    Arthritis    back, shoulders, left great toe   Candidiasis, mouth 03/22/2016   Carpal tunnel  syndrome of right wrist 10/2014   Dental crowns present    Depression    Dyslipidemia    Ear fullness 08/30/2016   GERD (gastroesophageal reflux disease)    Hypertension    under control with med., per pt.; has been on med. x 15-20 yr.   Insulin dependent diabetes mellitus    Nummular eczema    Obesity    Onychomycosis 08/30/2016   Recurrent infections 03/22/2016   Sleep apnea    uses CPAP nightly    Past Surgical History:  Procedure Laterality Date   CARDIAC CATHETERIZATION  12/30/2006   "mod. pulmonary HTN with preserved cardiac output and cardiac index"   CARPAL TUNNEL RELEASE Right 10/14/2014   Procedure: RIGHT CARPAL TUNNEL RELEASE;  Surgeon: Betha Loa, MD;  Location: [facility];  Service: Orthopedics;  Laterality: Right;   CARPAL TUNNEL RELEASE Left 05/19/2015   Procedure: LEFT CARPAL TUNNEL RELEASE;  Surgeon: Betha Loa, MD;  Location: [facility];  Service: Orthopedics;  Laterality: Left;   CYST EXCISION     scalp and back   ESOPHAGOGASTRODUODENOSCOPY  03/25/2007   FOOT SURGERY Left    revision traumatic amputation of foot   ROUX-EN-Y PROCEDURE  07/28/2007   with lysis of adhesions    Family Psychiatric History: Family History:  Family History  Problem Relation Age of Onset   Heart disease Mother    Cancer Mother 68  Multiple myeloma   Diabetes Father    Diabetes Brother    Heart disease Brother 52       cardiomyopathy   Heart disease Brother 40       MI   Arthritis Paternal Grandmother    Diabetes Paternal Grandmother    Heart disease Paternal Grandmother    Stroke Paternal Grandmother     Social History:   Social History   Socioeconomic History   Marital status: Single    Spouse name: Not on file   Number of children: Not on file   Years of education: Not on file   Highest education level: Not on file  Occupational History   Not on file  Tobacco Use   Smoking status: Former    Packs/day: 1.00    Years: 33.00     Total pack years: 33.00    Types: Cigarettes    Start date: 57    Quit date: 07/03/2007    Years since quitting: 15.0   Smokeless tobacco: Never  Substance and Sexual Activity   Alcohol use: Yes    Comment: seldom   Drug use: No   Sexual activity: Not Currently  Other Topics Concern   Not on file  Social History Narrative   Not on file   Social Determinants of Health   Financial Resource Strain: Not on file  Food Insecurity: Not on file  Transportation Needs: Not on file  Physical Activity: Not on file  Stress: Not on file  Social Connections: Not on file    Additional Social History:   Allergies:   Allergies  Allergen Reactions   Latex Itching    Metabolic Disorder Labs: Lab Results  Component Value Date   HGBA1C 6.6 11/17/2021   No results found for: "PROLACTIN" Lab Results  Component Value Date   CHOL 101 11/07/2020   TRIG 70 11/07/2020   HDL 40 11/07/2020   CHOLHDL 2.5 11/07/2020   VLDL 17 11/13/2010   LDLCALC 46 11/07/2020   LDLCALC 60 11/13/2010     Therapeutic Level Labs: No results found for: "LITHIUM" No results found for: "CBMZ" No results found for: "VALPROATE"  Current Medications: Current Outpatient Medications  Medication Sig Dispense Refill   acetaminophen (TYLENOL) 500 MG tablet Take 500 mg by mouth every 6 (six) hours as needed for mild pain.     ASPIRIN LOW DOSE 81 MG EC tablet TAKE 1 TABLET BY MOUTH AT  BEDTIME . SWALLOW WHOLE. 30 tablet 0   Azelastine HCl 137 MCG/SPRAY SOLN Place into both nostrils.     B Complex-C (B-COMPLEX WITH VITAMIN C) tablet Take 1 tablet by mouth 2 (two) times daily.     Blood Glucose Monitoring Suppl (ONETOUCH VERIO IQ SYSTEM) W/DEVICE KIT 1 each by Does not apply route daily. 1 kit 0   buPROPion (WELLBUTRIN SR) 200 MG 12 hr tablet Take 1 tablet (200 mg total) by mouth 2 (two) times daily. 180 tablet 3   calcium-vitamin D 250-100 MG-UNIT per tablet Take 1 tablet by mouth 2 (two) times daily.     celecoxib  (CELEBREX) 100 MG capsule Take 1 capsule (100 mg total) by mouth 2 (two) times daily. 60 capsule 0   cetirizine (ZYRTEC) 10 MG tablet Take 10 mg by mouth daily.     clobetasol (TEMOVATE) 0.05 % GEL Apply topically 2 (two) times daily. (Patient not taking: Reported on 07/03/2022)     Continuous Blood Gluc Sensor (FREESTYLE LIBRE 2 SENSOR) MISC Inject 1 sensor to  the skin every 14 days for continuous glucose monitoring.     DENTA 5000 PLUS 1.1 % CREA dental cream Take by mouth.     desvenlafaxine (PRISTIQ) 50 MG 24 hr tablet Take 1 tablet (50 mg total) by mouth daily. 90 tablet 1   empagliflozin (JARDIANCE) 25 MG TABS tablet Take 25 mg by mouth daily.     Ferrous Sulfate (IRON SLOW RELEASE) 142 (45 Fe) MG TBCR Take 1 tablet by mouth daily. 30 tablet 5   fluticasone (FLONASE) 50 MCG/ACT nasal spray USE 1 SPRAY NASALLY ONCE A  DAY FOR ALLERGIES 32 g 1   glucose blood test strip USE TO CHECK SUGAR 4 TIMES  DAILY.     insulin glargine (LANTUS SOLOSTAR) 100 UNIT/ML Solostar Pen Inject 21 Units into the skin daily.     Insulin Pen Needle 32G X 4 MM MISC Use to inject insulin once a day or as directed     Insulin Syringe-Needle U-100 (INSULIN SYRINGE 1CC/31GX5/16") 31G X 5/16" 1 ML MISC Use as directed once daily ( Dx code E 11.65)     levocetirizine (XYZAL) 5 MG tablet Take 5 mg by mouth at bedtime.     lisinopril (ZESTRIL) 10 MG tablet Take 1 tablet (10 mg total) by mouth daily. 90 tablet 3   metFORMIN (GLUCOPHAGE) 1000 MG tablet Take 1,000 mg by mouth 2 (two) times daily with a meal.     montelukast (SINGULAIR) 10 MG tablet TAKE 1 TABLET BY MOUTH AT  BEDTIME 90 tablet 1   Multiple Vitamin (MULTI-VITAMINS) TABS Take 1 tablet twice a day     omeprazole (PRILOSEC) 40 MG capsule TAKE 1 CAPSULE BY MOUTH DAILY 90 capsule 1   ONETOUCH DELICA LANCETS 32T MISC      ONETOUCH VERIO test strip      rosuvastatin (CRESTOR) 5 MG tablet TAKE 1 TABLET BY MOUTH DAILY 90 tablet 1   traZODone (DESYREL) 100 MG tablet Take  1 tablet (100 mg total) by mouth at bedtime. 90 tablet 1   No current facility-administered medications for this visit.    Musculoskeletal: Strength & Muscle Tone: within normal limits Gait & Station: normal Patient leans: Left  Psychiatric Specialty Exam: Review of Systems  There were no vitals taken for this visit.There is no height or weight on file to calculate BMI.  General Appearance: Casual  Eye Contact:  Good  Speech:  NA  Volume:  Normal  Mood:  Dysphoric  Affect:  NA  Thought Process:  Coherent  Orientation:    Thought Content:  Logical  Suicidal Thoughts:  No  Homicidal Thoughts:  No  Memory:  NA  Judgement:  Good  Insight:    Psychomotor Activity:  NA  Concentration:    Recall:  Good  Fund of Knowledge:Good  Language: Good  Akathisia:    Handed:  Right  AIMS (if indicated):  not done  Assets:  Desire for Improvement  ADL's:  Intact  Cognition: WNL  Sleep:  Good   Screenings: PHQ2-9    [place] [facility] Visit from 06/15/2022 in [place] Visit from 11/07/2020 in [place] Visit from 09/15/2020 in [place] Visit from 02/15/2017 in [place] Visit from 08/30/2016 in [facility]  PHQ-2 Total Score 6 6 2 6  0  PHQ-9 Total Score 14 13 12  -- --       Assessment and Plan:   This patient has a  long history of treatment for major depression.  He apparently been on multiple medications from his previous psychiatrist.  On his last visit we adjusted the way he was taking his Pristiq.  He was actually taking it a couple times a week.  We asked him then to change it to taking 50 mg every day when she is done.  He says has been helpful.  His mood is good.  He also takes Wellbutrin 200 mg slow release twice daily.  He probably takes it this way due to his gastric bypass surgery.  The important good news is that he has discontinued his Abilify and  does not miss it.  He still takes some trazodone and helps him sleep in conjunction with his CPAP machine.  Today we shared with him the name of Neysa Bonito a local psychotherapist who has been contacted and is willing to see this patient.  The patient was given his number and the patient says he will call and get an appointment.  The patient's first problem therefore is major clinical depression in remission.  We will continue the above medicines as prescribed.  His second problem is that he has insomnia which is treated with a CPAP machine and some trazodone.  The patient will return to see me in 3 months.  Patient is functioning extremely well. Collaboration of Care:   Patient/Guardian was advised Release of Information must be obtained prior to any record release in order to collaborate their care with an outside provider. Patient/Guardian was advised if they have not already done so to contact the registration department to sign all necessary forms in order for [place] to release information regarding their care.   Consent: Patient/Guardian gives verbal consent for treatment and assignment of benefits for services provided during this visit. Patient/Guardian expressed understanding and agreed to proceed.   Gypsy Balsam, MD 1/5/20249:25 AM

## 2022-07-09 ENCOUNTER — Ambulatory Visit: Payer: 59 | Admitting: Orthopedic Surgery

## 2022-07-12 ENCOUNTER — Encounter: Payer: Self-pay | Admitting: Internal Medicine

## 2022-07-15 ENCOUNTER — Other Ambulatory Visit: Payer: Self-pay | Admitting: Surgical

## 2022-08-06 ENCOUNTER — Encounter: Payer: 59 | Admitting: Family Medicine

## 2022-08-20 ENCOUNTER — Ambulatory Visit (HOSPITAL_COMMUNITY): Payer: 59 | Admitting: Clinical

## 2022-08-22 ENCOUNTER — Ambulatory Visit (HOSPITAL_COMMUNITY): Payer: 59 | Admitting: Clinical

## 2022-09-18 ENCOUNTER — Encounter: Payer: Self-pay | Admitting: Family Medicine

## 2022-09-18 ENCOUNTER — Ambulatory Visit (INDEPENDENT_AMBULATORY_CARE_PROVIDER_SITE_OTHER): Payer: 59 | Admitting: Family Medicine

## 2022-09-18 VITALS — BP 126/72 | HR 65 | Temp 98.2°F | Resp 14 | Ht 70.0 in | Wt 190.6 lb

## 2022-09-18 DIAGNOSIS — K140 Glossitis: Secondary | ICD-10-CM

## 2022-09-18 DIAGNOSIS — K219 Gastro-esophageal reflux disease without esophagitis: Secondary | ICD-10-CM | POA: Diagnosis not present

## 2022-09-18 DIAGNOSIS — Z794 Long term (current) use of insulin: Secondary | ICD-10-CM

## 2022-09-18 DIAGNOSIS — Z23 Encounter for immunization: Secondary | ICD-10-CM

## 2022-09-18 DIAGNOSIS — E119 Type 2 diabetes mellitus without complications: Secondary | ICD-10-CM

## 2022-09-18 DIAGNOSIS — E1139 Type 2 diabetes mellitus with other diabetic ophthalmic complication: Secondary | ICD-10-CM

## 2022-09-18 DIAGNOSIS — G2581 Restless legs syndrome: Secondary | ICD-10-CM

## 2022-09-18 DIAGNOSIS — E1159 Type 2 diabetes mellitus with other circulatory complications: Secondary | ICD-10-CM | POA: Diagnosis not present

## 2022-09-18 DIAGNOSIS — E1169 Type 2 diabetes mellitus with other specified complication: Secondary | ICD-10-CM

## 2022-09-18 DIAGNOSIS — Z0000 Encounter for general adult medical examination without abnormal findings: Secondary | ICD-10-CM

## 2022-09-18 DIAGNOSIS — G4733 Obstructive sleep apnea (adult) (pediatric): Secondary | ICD-10-CM

## 2022-09-18 DIAGNOSIS — E785 Hyperlipidemia, unspecified: Secondary | ICD-10-CM

## 2022-09-18 DIAGNOSIS — I152 Hypertension secondary to endocrine disorders: Secondary | ICD-10-CM

## 2022-09-18 DIAGNOSIS — I493 Ventricular premature depolarization: Secondary | ICD-10-CM

## 2022-09-18 DIAGNOSIS — F341 Dysthymic disorder: Secondary | ICD-10-CM

## 2022-09-18 DIAGNOSIS — J309 Allergic rhinitis, unspecified: Secondary | ICD-10-CM

## 2022-09-18 DIAGNOSIS — Z9884 Bariatric surgery status: Secondary | ICD-10-CM

## 2022-09-18 LAB — POCT URINALYSIS DIP (PROADVANTAGE DEVICE)
Bilirubin, UA: NEGATIVE
Blood, UA: NEGATIVE
Glucose, UA: >=1000 mg/dL — AB
Ketones, POC UA: NEGATIVE mg/dL
Leukocytes, UA: NEGATIVE
Nitrite, UA: NEGATIVE
Protein Ur, POC: NEGATIVE mg/dL
Specific Gravity, Urine: 1.015
Urobilinogen, Ur: 0.2
pH, UA: 7.5 (ref 5.0–8.0)

## 2022-09-18 MED ORDER — AZELASTINE HCL 137 MCG/SPRAY NA SOLN: 1.0000 | Freq: Every day | NASAL | 3 refills | Status: AC

## 2022-09-18 MED ORDER — MONTELUKAST SODIUM 10 MG PO TABS: 10.0000 mg | ORAL_TABLET | Freq: Every day | ORAL | 3 refills | Status: DC

## 2022-09-18 MED ORDER — LEVOCETIRIZINE DIHYDROCHLORIDE 5 MG PO TABS: 5.0000 mg | ORAL_TABLET | Freq: Every day | ORAL | 3 refills | Status: DC

## 2022-09-18 NOTE — Progress Notes (Signed)
Complete physical exam  Patient: [name]   DOB: 1956/01/07   67 y.o. Male  MRN: [id number]  Subjective:    Chief Complaint  Patient presents with   Annual Exam    No additional concerns.     [name] is a 67 y.o. male who presents today for a complete physical exam. He reports consuming a general diet. Home exercise routine includes walking 3 times a day. He generally feels well. He reports sleeping fairly well. He is for his diabetes and is using a continuous glucose monitoring.  He continues to be followed by Dr. Casimiro Needle for his underlying depression and he states things are going quite well there.  He does have OSA and is using an oral appliance that he states is giving good results.  His allergies seem to be under good control.  He has had some shoulder pain and at this point is using an inside for control.  He is supposed to follow-up with orthopedics in about 6 months.  He is not complaining of any reflux symptoms.  He still does have issues with his mouth but there were no complaints today.Otherwise his family and social history as well as health maintenance and immunizations was reviewed. Most recent fall risk assessment:    09/18/2022    3:04 PM  [place] in the past year? 0  Number falls in past yr: 0  Injury with Fall? 0  Risk for fall due to : No Fall Risks  Follow up Falls evaluation completed     Most recent depression screenings:    09/18/2022    3:04 PM 06/15/2022    2:29 PM  PHQ 2/9 Scores  PHQ - 2 Score 3 6  PHQ- 9 Score 4 14    Vision:Within last year and Dental: Receives regular dental care    Patient Care Team: Denita Lung, MD as PCP - General (Family Medicine)   Outpatient Medications Prior to Visit  Medication Sig   acetaminophen (TYLENOL) 500 MG tablet Take 500 mg by mouth every 6 (six) hours as needed for mild pain.   ASPIRIN LOW DOSE 81 MG EC tablet TAKE 1 TABLET BY MOUTH AT  BEDTIME . SWALLOW WHOLE.   B  Complex-C (B-COMPLEX WITH VITAMIN C) tablet Take 1 tablet by mouth 2 (two) times daily.   Blood Glucose Monitoring Suppl (ONETOUCH VERIO IQ SYSTEM) W/DEVICE KIT 1 each by Does not apply route daily.   buPROPion (WELLBUTRIN SR) 200 MG 12 hr tablet Take 1 tablet (200 mg total) by mouth 2 (two) times daily.   calcium-vitamin D 250-100 MG-UNIT per tablet Take 1 tablet by mouth 2 (two) times daily.   celecoxib (CELEBREX) 100 MG capsule TAKE 1 CAPSULE BY MOUTH TWICE  DAILY   Continuous Blood Gluc Sensor (FREESTYLE LIBRE 2 SENSOR) MISC Inject 1 sensor to the skin every 14 days for continuous glucose monitoring.   DENTA 5000 PLUS 1.1 % CREA dental cream Take by mouth.   desvenlafaxine (PRISTIQ) 50 MG 24 hr tablet Take 1 tablet (50 mg total) by mouth daily.   empagliflozin (JARDIANCE) 25 MG TABS tablet Take 25 mg by mouth daily.   Ferrous Sulfate (IRON SLOW RELEASE) 142 (45 Fe) MG TBCR Take 1 tablet by mouth daily.   fluticasone (FLONASE) 50 MCG/ACT nasal spray USE 1 SPRAY NASALLY ONCE A  DAY FOR ALLERGIES   glucose blood test strip USE TO CHECK SUGAR 4 TIMES  DAILY.  insulin glargine (LANTUS SOLOSTAR) 100 UNIT/ML Solostar Pen Inject 21 Units into the skin daily.   Insulin Pen Needle 32G X 4 MM MISC Use to inject insulin once a day or as directed   Insulin Syringe-Needle U-100 (INSULIN SYRINGE 1CC/31GX5/16") 31G X 5/16" 1 ML MISC Use as directed once daily ( Dx code E 11.65)   lisinopril (ZESTRIL) 10 MG tablet Take 1 tablet (10 mg total) by mouth daily.   metFORMIN (GLUCOPHAGE) 1000 MG tablet Take 1,000 mg by mouth 2 (two) times daily with a meal.   Multiple Vitamin (MULTI-VITAMINS) TABS Take 1 tablet twice a day   omeprazole (PRILOSEC) 40 MG capsule TAKE 1 CAPSULE BY MOUTH DAILY   ONETOUCH DELICA LANCETS 99991111 MISC    ONETOUCH VERIO test strip    rosuvastatin (CRESTOR) 5 MG tablet TAKE 1 TABLET BY MOUTH DAILY   traZODone (DESYREL) 100 MG tablet Take 1 tablet (100 mg total) by mouth at bedtime.    [DISCONTINUED] Azelastine HCl 137 MCG/SPRAY SOLN Place into both nostrils.   [DISCONTINUED] cetirizine (ZYRTEC) 10 MG tablet Take 10 mg by mouth daily.   [DISCONTINUED] levocetirizine (XYZAL) 5 MG tablet Take 5 mg by mouth at bedtime.   [DISCONTINUED] montelukast (SINGULAIR) 10 MG tablet TAKE 1 TABLET BY MOUTH AT  BEDTIME   [DISCONTINUED] clobetasol (TEMOVATE) 0.05 % GEL Apply topically 2 (two) times daily. (Patient not taking: Reported on 07/03/2022)   No facility-administered medications prior to visit.    Review of Systems  All other systems reviewed and are negative.         Objective:     BP 126/72   Pulse 65   Temp 98.2 F (36.8 C) (Oral)   Resp 14   Ht 5\' 10"  (1.778 m)   Wt 190 lb 9.6 oz (86.5 kg)   SpO2 100% Comment: room air  BMI 27.35 kg/m    Physical Exam  Alert and in no distress. Tympanic membranes and canals are normal. Pharyngeal area is normal. Neck is supple without adenopathy or thyromegaly. Cardiac exam shows a regular sinus rhythm without murmurs or gallops. Lungs are clear to auscultation.      Assessment & Plan:    Routine general medical examination at a health care facility - Plan: POCT Urinalysis DIP (Proadvantage Device)  Hypertension associated with diabetes ([place])  Obstructive sleep apnea  Allergic rhinitis, unspecified seasonality, unspecified trigger - Plan: levocetirizine (XYZAL) 5 MG tablet, montelukast (SINGULAIR) 10 MG tablet, Azelastine HCl 137 MCG/SPRAY SOLN  Gastroesophageal reflux disease without esophagitis  Chronic glossitis  Hyperlipidemia associated with type 2 diabetes mellitus ([place]) - Plan: Lipid panel  Type 2 diabetes mellitus with other ophthalmic complication, with long-term current use of insulin (HCC)  Status post bariatric surgery  Persistent depressive disorder  RLS (restless legs syndrome)  Need for COVID-19 vaccine - Plan: Pfizer Fall 2023 Covid-19 Vaccine 47yrs and older  Immunization History   Administered Date(s) Administered   H1N1 04/22/2008   Hep B / HiB 04/23/2008, 05/20/2008, 12/23/2008   Influenza Split 03/18/2008, 05/02/2014, 03/13/2019, 03/30/2020   Influenza, Seasonal, Injecte, Preservative Fre 04/03/2011   Influenza,inj,Quad PF,6+ Mos 03/18/2012, 04/07/2015, 03/22/2016, 04/02/2017, 04/08/2018, 03/11/2019, 03/30/2020, 03/17/2021, 03/28/2022   Influenza-Unspecified 04/22/2008, 05/02/2014, 04/07/2015, 03/22/2016, 04/02/2017, 04/08/2018, 03/11/2019, 03/13/2019, 03/30/2020   PFIZER Comirnaty(Gray Top)Covid-19 Tri-Sucrose Vaccine 09/11/2019, 10/05/2019, 04/07/2020, 11/07/2020   PFIZER(Purple Top)SARS-COV-2 Vaccination 09/11/2019, 10/05/2019, 04/07/2020   Pfizer Covid-19 Vaccine Bivalent Booster 48yrs & up 04/25/2021   Pneumococcal Conjugate-13 04/07/2015, 04/07/2015   Pneumococcal Polysaccharide-23 04/02/2017  Tdap 04/23/2008, 08/06/2017, 08/06/2017   Zoster Recombinat (Shingrix) 08/06/2017, 03/11/2019   Zoster, Live 08/06/2017, 03/11/2019   Zoster, Unspecified 08/06/2017, 03/11/2019    Health Maintenance  Topic Date Due   Diabetic kidney evaluation - Urine ACR  11/07/2021   FOOT EXAM  11/07/2021   COVID-19 Vaccine (9 - 2023-24 season) 03/02/2022   Pneumonia Vaccine 79+ Years old (3 of 3 - PPSV23 or PCV20) 07/17/2022   HEMOGLOBIN A1C  10/10/2022   Diabetic kidney evaluation - eGFR measurement  07/04/2023   OPHTHALMOLOGY EXAM  07/06/2023   Fecal DNA (Cologuard)  10/07/2023   DTaP/Tdap/Td (4 - Td or Tdap) 08/07/2027   INFLUENZA VACCINE  Completed   Hepatitis C Screening  Completed   HIV Screening  Completed   Zoster Vaccines- Shingrix  Completed   HPV VACCINES  Aged Out   COLONOSCOPY (Pts 45-48yrs Insurance coverage will need to be confirmed)  Discontinued    Discussed The blood sugar readings that he is getting.  He seems to ignore the high numbers and I explained to him that he needs to make adjustments in his eating habits based on the high numbers to help  reduce the likelihood of a toxic effect of blood sugars.  He will continue on his other medications and follow-up with endocrinology as well as orthopedics, Dr. Casimiro Needle and continue to wear the oral appliance for his OSA. Problem List Items Addressed This Visit     Allergic rhinitis   Relevant Medications   levocetirizine (XYZAL) 5 MG tablet   montelukast (SINGULAIR) 10 MG tablet   Azelastine HCl 137 MCG/SPRAY SOLN   Chronic glossitis   Diabetes (HCC)   GERD   Hyperlipidemia associated with type 2 diabetes mellitus ([place])   Relevant Orders   Lipid panel   Hypertension associated with diabetes ([place])   Obstructive sleep apnea   Persistent depressive disorder   RLS (restless legs syndrome)   Status post bariatric surgery   Other Visit Diagnoses     Routine general medical examination at a health care facility    -  Primary   Relevant Orders   POCT Urinalysis DIP (Proadvantage Device) (Completed)   Need for COVID-19 vaccine       Relevant Orders   Pfizer Fall 2023 Covid-19 Vaccine 46yrs and older      Follow-up 1 year    Jill Alexanders, MD

## 2022-09-19 LAB — LIPID PANEL
Chol/HDL Ratio: 3.1 ratio (ref 0.0–5.0)
Cholesterol, Total: 160 mg/dL (ref 100–199)
HDL: 51 mg/dL (ref >39)
LDL Chol Calc (NIH): 99 mg/dL (ref 0–99)
Triglycerides: 50 mg/dL (ref 0–149)
VLDL Cholesterol Cal: 10 mg/dL (ref 5–40)

## 2022-09-19 MED ORDER — ROSUVASTATIN CALCIUM 20 MG PO TABS: 20.0000 mg | ORAL_TABLET | Freq: Every day | ORAL | 3 refills | Status: DC

## 2022-09-19 NOTE — Addendum Note (Signed)
Addended by: Denita Lung on: 09/19/2022 08:05 AM   Modules accepted: Orders

## 2022-10-10 ENCOUNTER — Encounter (HOSPITAL_COMMUNITY): Payer: Self-pay | Admitting: Psychiatry

## 2022-10-10 ENCOUNTER — Ambulatory Visit (HOSPITAL_COMMUNITY): Payer: 59 | Admitting: Psychiatry

## 2022-10-10 VITALS — BP 130/73 | HR 88 | Ht 71.0 in | Wt 191.0 lb

## 2022-10-10 DIAGNOSIS — F324 Major depressive disorder, single episode, in partial remission: Secondary | ICD-10-CM

## 2022-10-10 MED ORDER — BUPROPION HCL ER (SR) 200 MG PO TB12: 200.0000 mg | ORAL_TABLET | Freq: Two times a day (BID) | ORAL | 3 refills | Status: DC

## 2022-10-10 MED ORDER — DESVENLAFAXINE SUCCINATE ER 50 MG PO TB24: ORAL_TABLET | ORAL | 1 refills | Status: DC

## 2022-10-10 NOTE — Progress Notes (Signed)
Psychiatric Initial Adult Assessment   Patient Identification: [name] MRN:  [id number] Date of Evaluation:  10/10/2022 Referral Source: Dr. Porfirio Mylar Dohmeier Chief Complaint: Depression Visit Diagnosis: Major depression recurrent  History of Present Illness:    Today the patient is doing fairly well.  His brother who had been chronically ill died about a month ago.  The funeral was okay.  It was cathartic.  A lot of people attended.  Today the patient says he is sleeping better because his CPAP machine has been adjusted.  He is eating well and has good energy and has no problems thinking and concentrating.  He continues to work without a problem.  He has a good sense of worth and he is not suicidal.  Since I saw him he discontinued his Abilify 5 mg and he does not miss it.  Given the fact that he has diabetes and hypercholesterolemia and it is good that he is off the Abilify.  Patient likes to read.  He has a dog that is 71 years old that he walks a lot.  He watches TV and enjoys himself.  He is not in a relationship at this time.  He himself discontinued the trazodone 100 mg because he said he does not need it.  The patient was back to taking Pristiq on a regular basis.  On his last visit he told us he took it just a couple times a week.  But now he takes 50 mg every day.  The patient is going to begin in psychotherapy with By[facility] H[facility] the [facility].  Associated Signs/Symptoms: Depression Symptoms:   (Hypo) Manic Symptoms:   Anxiety Symptoms:   Psychotic Symptoms:   PTSD Symptoms: NA  Past Psychiatric History:   Previous Psychotropic Medications: Yes   Substance Abuse History in the last 12 months:  No.  Consequences of Substance Abuse: NA  Past Medical History:  Past Medical History:  Diagnosis Date   Anxiety    Arthritis    back, shoulders, left great toe   Candidiasis, mouth 03/22/2016   Carpal tunnel syndrome of right wrist 10/2014   Dental crowns present    Depression     Dyslipidemia    Ear fullness 08/30/2016   GERD (gastroesophageal reflux disease)    Hypertension    under control with med., per pt.; has been on med. x 15-20 yr.   Insulin dependent diabetes mellitus    Nummular eczema    Obesity    Onychomycosis 08/30/2016   Recurrent infections 03/22/2016   Sleep apnea    uses CPAP nightly    Past Surgical History:  Procedure Laterality Date   CARDIAC CATHETERIZATION  12/30/2006   "mod. pulmonary HTN with preserved cardiac output and cardiac index"   CARPAL TUNNEL RELEASE Right 10/14/2014   Procedure: RIGHT CARPAL TUNNEL RELEASE;  Surgeon: Betha Loa, MD;  Location: [facility];  Service: Orthopedics;  Laterality: Right;   CARPAL TUNNEL RELEASE Left 05/19/2015   Procedure: LEFT CARPAL TUNNEL RELEASE;  Surgeon: Betha Loa, MD;  Location: [facility];  Service: Orthopedics;  Laterality: Left;   CYST EXCISION     scalp and back   ESOPHAGOGASTRODUODENOSCOPY  03/25/2007   FOOT SURGERY Left    revision traumatic amputation of foot   ROUX-EN-Y PROCEDURE  07/28/2007   with lysis of adhesions    Family Psychiatric History: Family History:  Family History  Problem Relation Age of Onset   Heart disease Mother    Cancer Mother  76       Multiple myeloma   Diabetes Father    Diabetes Brother    Heart disease Brother 11       cardiomyopathy   Heart disease Brother 35       MI   Arthritis Paternal Grandmother    Diabetes Paternal Grandmother    Heart disease Paternal Grandmother    Stroke Paternal Grandmother     Social History:   Social History   Socioeconomic History   Marital status: Single    Spouse name: Not on file   Number of children: Not on file   Years of education: Not on file   Highest education level: Not on file  Occupational History   Not on file  Tobacco Use   Smoking status: Former    Packs/day: 1.00    Years: 33.00    Additional pack years: 0.00    Total pack years: 33.00    Types: Cigarettes     Start date: 29    Quit date: 07/03/2007    Years since quitting: 15.2   Smokeless tobacco: Never  Substance and Sexual Activity   Alcohol use: Yes    Comment: seldom   Drug use: No   Sexual activity: Not Currently  Other Topics Concern   Not on file  Social History Narrative   Not on file   Social Determinants of Health   Financial Resource Strain: Not on file  Food Insecurity: Not on file  Transportation Needs: Not on file  Physical Activity: Not on file  Stress: Not on file  Social Connections: Not on file    Additional Social History:   Allergies:   Allergies  Allergen Reactions   Latex Itching    Metabolic Disorder Labs: Lab Results  Component Value Date   HGBA1C 5.2 04/10/2022   No results found for: "PROLACTIN" Lab Results  Component Value Date   CHOL 160 09/18/2022   TRIG 50 09/18/2022   HDL 51 09/18/2022   CHOLHDL 3.1 09/18/2022   VLDL 17 11/13/2010   LDLCALC 99 09/18/2022   LDLCALC 46 11/07/2020     Therapeutic Level Labs: No results found for: "LITHIUM" No results found for: "CBMZ" No results found for: "VALPROATE"  Current Medications: Current Outpatient Medications  Medication Sig Dispense Refill   acetaminophen (TYLENOL) 500 MG tablet Take 500 mg by mouth every 6 (six) hours as needed for mild pain.     ASPIRIN LOW DOSE 81 MG EC tablet TAKE 1 TABLET BY MOUTH AT  BEDTIME . SWALLOW WHOLE. 30 tablet 0   Azelastine HCl 137 MCG/SPRAY SOLN Place 1 spray into both nostrils daily. 30 mL 3   B Complex-C (B-COMPLEX WITH VITAMIN C) tablet Take 1 tablet by mouth 2 (two) times daily.     Blood Glucose Monitoring Suppl (ONETOUCH VERIO IQ SYSTEM) W/DEVICE KIT 1 each by Does not apply route daily. 1 kit 0   calcium-vitamin D 250-100 MG-UNIT per tablet Take 1 tablet by mouth 2 (two) times daily.     celecoxib (CELEBREX) 100 MG capsule TAKE 1 CAPSULE BY MOUTH TWICE  DAILY 60 capsule 5   Continuous Blood Gluc Sensor (FREESTYLE LIBRE 2 SENSOR) MISC Inject  1 sensor to the skin every 14 days for continuous glucose monitoring.     DENTA 5000 PLUS 1.1 % CREA dental cream Take by mouth.     empagliflozin (JARDIANCE) 25 MG TABS tablet Take 25 mg by mouth daily.     Ferrous Sulfate (IRON  SLOW RELEASE) 142 (45 Fe) MG TBCR Take 1 tablet by mouth daily. 30 tablet 5   fluticasone (FLONASE) 50 MCG/ACT nasal spray USE 1 SPRAY NASALLY ONCE A  DAY FOR ALLERGIES 32 g 1   glucose blood test strip USE TO CHECK SUGAR 4 TIMES  DAILY.     insulin glargine (LANTUS SOLOSTAR) 100 UNIT/ML Solostar Pen Inject 21 Units into the skin daily.     Insulin Pen Needle 32G X 4 MM MISC Use to inject insulin once a day or as directed     Insulin Syringe-Needle U-100 (INSULIN SYRINGE 1CC/31GX5/16") 31G X 5/16" 1 ML MISC Use as directed once daily ( Dx code E 11.65)     levocetirizine (XYZAL) 5 MG tablet Take 1 tablet (5 mg total) by mouth at bedtime. 90 tablet 3   lisinopril (ZESTRIL) 10 MG tablet Take 1 tablet (10 mg total) by mouth daily. 90 tablet 3   metFORMIN (GLUCOPHAGE) 1000 MG tablet Take 1,000 mg by mouth 2 (two) times daily with a meal.     montelukast (SINGULAIR) 10 MG tablet Take 1 tablet (10 mg total) by mouth at bedtime. 90 tablet 3   Multiple Vitamin (MULTI-VITAMINS) TABS Take 1 tablet twice a day     omeprazole (PRILOSEC) 40 MG capsule TAKE 1 CAPSULE BY MOUTH DAILY 90 capsule 1   ONETOUCH DELICA LANCETS 33G MISC      ONETOUCH VERIO test strip      rosuvastatin (CRESTOR) 20 MG tablet Take 1 tablet (20 mg total) by mouth daily. 90 tablet 3   buPROPion (WELLBUTRIN SR) 200 MG 12 hr tablet Take 1 tablet (200 mg total) by mouth 2 (two) times daily. 180 tablet 3   desvenlafaxine (PRISTIQ) 50 MG 24 hr tablet 2 qam 180 tablet 1   No current facility-administered medications for this visit.    Musculoskeletal: Strength & Muscle Tone: within normal limits Gait & Station: normal Patient leans: Left  Psychiatric Specialty Exam: Review of Systems  Blood pressure 130/73,  pulse 88, height 5\' 11"  (1.803 m), weight 191 lb (86.6 kg).Body mass index is 26.64 kg/m.  General Appearance: Casual  Eye Contact:  Good  Speech:  NA  Volume:  Normal  Mood:  Dysphoric  Affect:  NA  Thought Process:  Coherent  Orientation:    Thought Content:  Logical  Suicidal Thoughts:  No  Homicidal Thoughts:  No  Memory:  NA  Judgement:  Good  Insight:    Psychomotor Activity:  NA  Concentration:    Recall:  Good  Fund of Knowledge:Good  Language: Good  Akathisia:    Handed:  Right  AIMS (if indicated):  not done  Assets:  Desire for Improvement  ADL's:  Intact  Cognition: WNL  Sleep:  Good   Screenings: PHQ2-9    Flowsheet Row Office Visit from 09/18/2022 in [facility] Office Visit from 06/15/2022 in [facility] Office Visit from 11/07/2020 in [facility] Visit from 09/15/2020 in [facility] Office Visit from 02/15/2017 in [facility]  PHQ-2 Total Score 3 6 6 2 6   PHQ-9 Total Score 4 14 13 12  --       Assessment and Plan:    Today the patient is doing fair.  He acknowledges that his depression is partly from grieving for his brother.  Today we will clarify his Pristiq dose.  In increase it from 50 mg a day to 100 mg.  Will continue taking Wellbutrin 200  mg slow release twice daily.  Patient send gastric bypass surgery in the past.  He no longer takes trazodone and no longer takes Abilify.  Perhaps the best intervention is going to happen is that he is getting started in psychotherapy.  Patient she will return to see me in 3 months. Collaboration of Care:   Patient/Guardian was advised Release of Information must be obtained prior to any record release in order to collaborate their care with an outside provider. Patient/Guardian was advised if they have not already done so to contact the registration department to sign all necessary forms in order for [place] to release information regarding their care.    Consent: Patient/Guardian gives verbal consent for treatment and assignment of benefits for services provided during this visit. Patient/Guardian expressed understanding and agreed to proceed.   Gypsy Balsam, MD 4/10/20242:23 PM

## 2022-10-29 ENCOUNTER — Ambulatory Visit: Payer: 59 | Admitting: Medical

## 2022-10-29 VITALS — BP 120/62 | HR 67 | Temp 97.7°F | Wt 191.0 lb

## 2022-10-29 DIAGNOSIS — R5383 Other fatigue: Secondary | ICD-10-CM

## 2022-10-29 DIAGNOSIS — K921 Melena: Secondary | ICD-10-CM | POA: Diagnosis not present

## 2022-10-29 DIAGNOSIS — R634 Abnormal weight loss: Secondary | ICD-10-CM | POA: Diagnosis not present

## 2022-10-29 DIAGNOSIS — G4733 Obstructive sleep apnea (adult) (pediatric): Secondary | ICD-10-CM | POA: Insufficient documentation

## 2022-10-29 NOTE — Progress Notes (Signed)
Subjective:  [name] is a 67 y.o. male who presents for Chief Complaint  Patient presents with   Rectal Bleeding    Rectal bleeding during BM, no pain when going to bathroom. Saturday and Sunday when doing some errands, had no energy and had to go to bed for the rest of day.      Here today for concern is of low energy and blood in the stool.  He notes feeling lack of energy since the weekend.  He had 1 episode of bright red blood on the toilet paper today only.  No recent problems of blood in stool.  He denies rectal pain, no pain with wiping, no obvious hemorrhoid concerns.  He denies constipation or straining.  He denies any issues with fatigue for months, just in the past weekend.  Otherwise he feels like he has been in his usual state of health.  He has lost weight both intentional and unintentional in the last year.  He notes he has been dealing with some issues with recurrent oral thrush but finally found a doctor to help him with that.  He declines HIV test today.  We discussed evaluation of weight loss and thrush.  He notes no sexual activity since around 2017  He has a history of sleep apnea but uses an oral airway device  He denies any alcohol use.  He does use NSAIDs Celebrex daily.  No significant abdominal pain.  No other aggravating or relieving factors.    No other c/o.  Past Medical History:  Diagnosis Date   Anxiety    Arthritis    back, shoulders, left great toe   Candidiasis, mouth 03/22/2016   Carpal tunnel syndrome of right wrist 10/2014   Dental crowns present    Depression    Dyslipidemia    Ear fullness 08/30/2016   GERD (gastroesophageal reflux disease)    Hypertension    under control with med., per pt.; has been on med. x 15-20 yr.   Insulin dependent diabetes mellitus    Nummular eczema    Obesity    Onychomycosis 08/30/2016   Recurrent infections 03/22/2016   Sleep apnea    uses CPAP nightly   Current Outpatient Medications on File  Prior to Visit  Medication Sig Dispense Refill   acetaminophen (TYLENOL) 500 MG tablet Take 500 mg by mouth every 6 (six) hours as needed for mild pain.     ASPIRIN LOW DOSE 81 MG EC tablet TAKE 1 TABLET BY MOUTH AT  BEDTIME . SWALLOW WHOLE. 30 tablet 0   Azelastine HCl 137 MCG/SPRAY SOLN Place 1 spray into both nostrils daily. 30 mL 3   B Complex-C (B-COMPLEX WITH VITAMIN C) tablet Take 1 tablet by mouth 2 (two) times daily.     buPROPion (WELLBUTRIN SR) 200 MG 12 hr tablet Take 1 tablet (200 mg total) by mouth 2 (two) times daily. 180 tablet 3   calcium-vitamin D 250-100 MG-UNIT per tablet Take 1 tablet by mouth 2 (two) times daily.     celecoxib (CELEBREX) 100 MG capsule TAKE 1 CAPSULE BY MOUTH TWICE  DAILY 60 capsule 5   Continuous Blood Gluc Sensor (FREESTYLE LIBRE 2 SENSOR) MISC Inject 1 sensor to the skin every 14 days for continuous glucose monitoring.     DENTA 5000 PLUS 1.1 % CREA dental cream Take by mouth.     desvenlafaxine (PRISTIQ) 50 MG 24 hr tablet 2 qam 180 tablet 1   empagliflozin (JARDIANCE) 25 MG TABS  tablet Take 25 mg by mouth daily.     Ferrous Sulfate (IRON SLOW RELEASE) 142 (45 Fe) MG TBCR Take 1 tablet by mouth daily. 30 tablet 5   fluticasone (FLONASE) 50 MCG/ACT nasal spray USE 1 SPRAY NASALLY ONCE A  DAY FOR ALLERGIES 32 g 1   insulin glargine (LANTUS SOLOSTAR) 100 UNIT/ML Solostar Pen Inject 8 Units into the skin daily.     levocetirizine (XYZAL) 5 MG tablet Take 1 tablet (5 mg total) by mouth at bedtime. 90 tablet 3   lisinopril (ZESTRIL) 10 MG tablet Take 1 tablet (10 mg total) by mouth daily. 90 tablet 3   metFORMIN (GLUCOPHAGE) 1000 MG tablet Take 1,000 mg by mouth 2 (two) times daily with a meal.     montelukast (SINGULAIR) 10 MG tablet Take 1 tablet (10 mg total) by mouth at bedtime. 90 tablet 3   Multiple Vitamin (MULTI-VITAMINS) TABS Take 1 tablet twice a day     Nutritional Supplements (VITAMIN D BOOSTER PO) Take by mouth.     omeprazole (PRILOSEC) 40 MG  capsule TAKE 1 CAPSULE BY MOUTH DAILY 90 capsule 1   rosuvastatin (CRESTOR) 20 MG tablet Take 1 tablet (20 mg total) by mouth daily. 90 tablet 3   Blood Glucose Monitoring Suppl (ONETOUCH VERIO IQ SYSTEM) W/DEVICE KIT 1 each by Does not apply route daily. 1 kit 0   glucose blood test strip USE TO CHECK SUGAR 4 TIMES  DAILY.     Insulin Pen Needle 32G X 4 MM MISC Use to inject insulin once a day or as directed     Insulin Syringe-Needle U-100 (INSULIN SYRINGE 1CC/31GX5/16") 31G X 5/16" 1 ML MISC Use as directed once daily ( Dx code E 11.65)     ONETOUCH DELICA LANCETS 33G MISC      ONETOUCH VERIO test strip      No current facility-administered medications on file prior to visit.     The following portions of the patient's history were reviewed and updated as appropriate: allergies, current medications, past family history, past medical history, past social history, past surgical history and problem list.  ROS Otherwise as in subjective above     Objective: BP 120/62   Pulse 67   Temp 97.7 F (36.5 C)   Wt 191 lb (86.6 kg)   BMI 26.64 kg/m   General appearance: alert, no distress, well developed, well nourished HEENT: normocephalic, sclerae anicteric, conjunctiva pink and moist,  nares patent, no discharge or erythema, pharynx normal Oral cavity: MMM, no lesions Neck: supple, no lymphadenopathy, no thyromegaly, no masses Heart: RRR, normal S1, S2, no murmurs Lungs: CTA bilaterally, no wheezes, rhonchi, or rales Abdomen: +bs, soft, non tender, non distended, no masses, no hepatomegaly, no splenomegaly Pulses: 2+ radial pulses, 2+ pedal pulses, normal cap refill Ext: no edema Rectal- declined/deferred    Assessment: Encounter Diagnoses  Name Primary?   Blood in stool Yes   Low energy    Other fatigue    Weight loss    OSA (obstructive sleep apnea)      Plan: We discussed his current symptoms that started this past weekend.    We discussed possible causes of low  energy including thyroid disorders, anemia, heart issues, sleep apnea and others..  He agrees to labs below.    We discussed history of oral thrush, weight loss, possible causes.  He refused HIV test.  He did see Dr. Susann Givens here for his yearly physical recently in March 2024.  I  reviewed over that visit and recent labs in the chart record from January and March of this year  OSA-of note he uses oral airway device  Of note, Cologuard negative 2022   Joson was seen today for rectal bleeding.  Diagnoses and all orders for this visit:  Blood in stool -     CBC with Differential/Platelet -     TSH + free T4 -     Basic metabolic panel  Low energy -     CBC with Differential/Platelet -     TSH + free T4 -     Basic metabolic panel  Other fatigue -     CBC with Differential/Platelet -     TSH + free T4 -     Basic metabolic panel  Weight loss -     CBC with Differential/Platelet -     TSH + free T4 -     Basic metabolic panel  OSA (obstructive sleep apnea)    Follow up: pending labs

## 2022-10-30 LAB — CBC WITH DIFFERENTIAL/PLATELET
Basophils Absolute: 0.1 10*3/uL (ref 0.0–0.2)
Basos: 1 %
EOS (ABSOLUTE): 0.1 10*3/uL (ref 0.0–0.4)
Eos: 1 %
Hematocrit: 42.6 % (ref 37.5–51.0)
Hemoglobin: 14.7 g/dL (ref 13.0–17.7)
Immature Grans (Abs): 0 10*3/uL (ref 0.0–0.1)
Immature Granulocytes: 1 %
Lymphocytes Absolute: 2.1 10*3/uL (ref 0.7–3.1)
Lymphs: 25 %
MCH: 32.3 pg (ref 26.6–33.0)
MCHC: 34.5 g/dL (ref 31.5–35.7)
MCV: 94 fL (ref 79–97)
Monocytes Absolute: 0.6 10*3/uL (ref 0.1–0.9)
Monocytes: 7 %
Neutrophils Absolute: 5.5 10*3/uL (ref 1.4–7.0)
Neutrophils: 65 %
Platelets: 233 10*3/uL (ref 150–450)
RBC: 4.55 x10E6/uL (ref 4.14–5.80)
RDW: 12.2 % (ref 11.6–15.4)
WBC: 8.4 10*3/uL (ref 3.4–10.8)

## 2022-10-30 LAB — BASIC METABOLIC PANEL
BUN/Creatinine Ratio: 22 (ref 10–24)
BUN: 17 mg/dL (ref 8–27)
CO2: 23 mmol/L (ref 20–29)
Calcium: 9.9 mg/dL (ref 8.6–10.2)
Chloride: 102 mmol/L (ref 96–106)
Creatinine, Ser: 0.77 mg/dL (ref 0.76–1.27)
Glucose: 93 mg/dL (ref 70–99)
Potassium: 4.5 mmol/L (ref 3.5–5.2)
Sodium: 143 mmol/L (ref 134–144)
eGFR: 99 mL/min/{1.73_m2} (ref >59)

## 2022-10-30 LAB — TSH+FREE T4
Free T4: 1.01 ng/dL (ref 0.82–1.77)
TSH: 0.376 u[IU]/mL — ABNORMAL LOW (ref 0.450–4.500)

## 2022-10-30 NOTE — Progress Notes (Signed)
Your electrolytes are normal, your blood counts are normal.  Your thyroid is off suggesting some abnormal function of the thyroid gland.  Are you still seeing an endocrinologist, Dr. Janee Morn? Have yall discussed the thyroid function before?  If you are still seeing endocrinology, I will forward a copy of labs so you can do a follow-up with them  You are not anemic thankfully.  If you continue to see blood in the stool over the next few weeks, then I would recommend referral to gastroenterology for further evaluation.  I suspect this is just some bleeding from internal hemorrhoids but since this only happened this 1 time, we will not jump to any conclusions

## 2022-11-14 ENCOUNTER — Ambulatory Visit (HOSPITAL_COMMUNITY): Payer: 59 | Admitting: Clinical

## 2022-11-28 ENCOUNTER — Ambulatory Visit (HOSPITAL_COMMUNITY): Payer: 59 | Admitting: Clinical

## 2022-11-28 ENCOUNTER — Encounter (HOSPITAL_COMMUNITY): Payer: Self-pay | Admitting: Clinical

## 2022-11-28 ENCOUNTER — Other Ambulatory Visit: Payer: Self-pay | Admitting: Surgical

## 2022-11-28 DIAGNOSIS — F330 Major depressive disorder, recurrent, mild: Secondary | ICD-10-CM
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
## 2022-12-01 ENCOUNTER — Encounter (HOSPITAL_COMMUNITY): Payer: Self-pay

## 2022-12-11 ENCOUNTER — Encounter: Payer: Self-pay | Admitting: Podiatry

## 2022-12-11 ENCOUNTER — Ambulatory Visit: Payer: 59 | Admitting: Podiatry

## 2022-12-11 DIAGNOSIS — D2372 Other benign neoplasm of skin of left lower limb, including hip: Secondary | ICD-10-CM

## 2022-12-11 DIAGNOSIS — M722 Plantar fascial fibromatosis: Secondary | ICD-10-CM

## 2022-12-11 NOTE — Progress Notes (Signed)
He presents today chief complaint of painful callus plantar aspect of his left heel.  Objective: Vital signs stable oriented x 3.  Painful porokeratotic lesion plantar aspect of the left heel no open lesions or wounds are noted.  Prominent inferior calcaneal tubercle.  Assessment: Pain secondary to benign skin lesion.  Plan: Mechanical debridement benign skin lesion.

## 2022-12-27 ENCOUNTER — Ambulatory Visit (HOSPITAL_COMMUNITY): Payer: 59 | Admitting: Clinical

## 2023-01-07 ENCOUNTER — Ambulatory Visit (HOSPITAL_COMMUNITY): Payer: 59 | Admitting: Clinical

## 2023-01-07 LAB — HM DIABETES EYE EXAM

## 2023-01-09 ENCOUNTER — Encounter (HOSPITAL_COMMUNITY): Payer: Self-pay | Admitting: Psychiatry

## 2023-01-09 ENCOUNTER — Ambulatory Visit (HOSPITAL_BASED_OUTPATIENT_CLINIC_OR_DEPARTMENT_OTHER): Payer: 59 | Admitting: Psychiatry

## 2023-01-09 ENCOUNTER — Other Ambulatory Visit: Payer: Self-pay

## 2023-01-09 VITALS — BP 134/70 | HR 69 | Ht 71.0 in | Wt 190.0 lb

## 2023-01-09 DIAGNOSIS — F607 Dependent personality disorder: Secondary | ICD-10-CM | POA: Diagnosis not present

## 2023-01-09 DIAGNOSIS — F325 Major depressive disorder, single episode, in full remission: Secondary | ICD-10-CM | POA: Diagnosis not present

## 2023-01-09 MED ORDER — BUPROPION HCL ER (SR) 200 MG PO TB12: 200.0000 mg | ORAL_TABLET | Freq: Two times a day (BID) | ORAL | 3 refills | Status: DC

## 2023-01-09 MED ORDER — DESVENLAFAXINE SUCCINATE ER 50 MG PO TB24: ORAL_TABLET | ORAL | 1 refills | Status: DC

## 2023-01-09 NOTE — Progress Notes (Signed)
Psychiatric Initial Adult Assessment   Patient Identification: [name] MRN:  [id number] Date of Evaluation:  01/09/2023 Referral Source: Dr. Porfirio Mylar Dohmeier Chief Complaint: Depression Visit Diagnosis: Major depression recurrent  History of Present Illness:    Today he started he believes the Pristiq has been very helpful.  He takes 2 pills a day in conjunction with his Wellbutrin.  He has a new system for his sleep apnea and rarely has to take any trazodone.  The patient continues to work.  He worked for another 2 years and probably retired.  The patient spends a lot of time watching TV has recently bought some furniture and does a lot of gardening.  Generally patient is eating well has good energy and is functioning very well.  He drinks no alcohol and uses no drugs.  Associated Signs/Symptoms: Depression Symptoms:   (Hypo) Manic Symptoms:   Anxiety Symptoms:   Psychotic Symptoms:   PTSD Symptoms: NA  Past Psychiatric History:   Previous Psychotropic Medications: Yes   Substance Abuse History in the last 12 months:  No.  Consequences of Substance Abuse: NA  Past Medical History:  Past Medical History:  Diagnosis Date   Anxiety    Arthritis    back, shoulders, left great toe   Candidiasis, mouth 03/22/2016   Carpal tunnel syndrome of right wrist 10/2014   Dental crowns present    Depression    Dyslipidemia    Ear fullness 08/30/2016   GERD (gastroesophageal reflux disease)    Hypertension    under control with med., per pt.; has been on med. x 15-20 yr.   Insulin dependent diabetes mellitus    Nummular eczema    Obesity    Onychomycosis 08/30/2016   Recurrent infections 03/22/2016   Sleep apnea    uses CPAP nightly    Past Surgical History:  Procedure Laterality Date   CARDIAC CATHETERIZATION  12/30/2006   "mod. pulmonary HTN with preserved cardiac output and cardiac index"   CARPAL TUNNEL RELEASE Right 10/14/2014   Procedure: RIGHT CARPAL TUNNEL RELEASE;   Surgeon: Betha Loa, MD;  Location: [facility];  Service: Orthopedics;  Laterality: Right;   CARPAL TUNNEL RELEASE Left 05/19/2015   Procedure: LEFT CARPAL TUNNEL RELEASE;  Surgeon: Betha Loa, MD;  Location: [facility];  Service: Orthopedics;  Laterality: Left;   CYST EXCISION     scalp and back   ESOPHAGOGASTRODUODENOSCOPY  03/25/2007   FOOT SURGERY Left    revision traumatic amputation of foot   ROUX-EN-Y PROCEDURE  07/28/2007   with lysis of adhesions    Family Psychiatric History: Family History:  Family History  Problem Relation Age of Onset   Heart disease Mother    Cancer Mother 4       Multiple myeloma   Diabetes Father    Diabetes Brother    Heart disease Brother 78       cardiomyopathy   Heart disease Brother 4       MI   Arthritis Paternal Grandmother    Diabetes Paternal Grandmother    Heart disease Paternal Grandmother    Stroke Paternal Grandmother     Social History:   Social History   Socioeconomic History   Marital status: Single    Spouse name: Not on file   Number of children: Not on file   Years of education: Not on file   Highest education level: Not on file  Occupational History   Not on file  Tobacco  Use   Smoking status: Former    Packs/day: 1.00    Years: 33.00    Additional pack years: 0.00    Total pack years: 33.00    Types: Cigarettes    Start date: 79    Quit date: 07/03/2007    Years since quitting: 15.5   Smokeless tobacco: Never  Substance and Sexual Activity   Alcohol use: Yes    Comment: seldom   Drug use: Yes    Frequency: 7.0 times per week    Types: Marijuana    Comment: 2 bowls per day   Sexual activity: Not Currently  Other Topics Concern   Not on file  Social History Narrative   Not on file   Social Determinants of Health   Financial Resource Strain: Not on file  Food Insecurity: Not on file  Transportation Needs: Not on file  Physical Activity: Not on file  Stress: Not on  file  Social Connections: Not on file    Additional Social History:   Allergies:   Allergies  Allergen Reactions   Hydrogen Peroxide     Other Reaction(s): Other (See Comments)   Latex Itching    Metabolic Disorder Labs: Lab Results  Component Value Date   HGBA1C 5.2 04/10/2022   No results found for: "PROLACTIN" Lab Results  Component Value Date   CHOL 160 09/18/2022   TRIG 50 09/18/2022   HDL 51 09/18/2022   CHOLHDL 3.1 09/18/2022   VLDL 17 11/13/2010   LDLCALC 99 09/18/2022   LDLCALC 46 11/07/2020     Therapeutic Level Labs: No results found for: "LITHIUM" No results found for: "CBMZ" No results found for: "VALPROATE"  Current Medications: Current Outpatient Medications  Medication Sig Dispense Refill   acetaminophen (TYLENOL) 500 MG tablet Take 500 mg by mouth every 6 (six) hours as needed for mild pain.     ASPIRIN LOW DOSE 81 MG EC tablet TAKE 1 TABLET BY MOUTH AT  BEDTIME . SWALLOW WHOLE. 30 tablet 0   Azelastine HCl 137 MCG/SPRAY SOLN Place 1 spray into both nostrils daily. 30 mL 3   B Complex-C (B-COMPLEX WITH VITAMIN C) tablet Take 1 tablet by mouth 2 (two) times daily.     calcium-vitamin D 250-100 MG-UNIT per tablet Take 1 tablet by mouth 2 (two) times daily.     celecoxib (CELEBREX) 100 MG capsule TAKE 1 CAPSULE BY MOUTH TWICE  DAILY 180 capsule 3   cetirizine (ZYRTEC) 10 MG tablet Take 10 mg by mouth daily.     Continuous Blood Gluc Sensor (FREESTYLE LIBRE 2 SENSOR) MISC Inject 1 sensor to the skin every 14 days for continuous glucose monitoring.     Continuous Glucose Receiver (FREESTYLE LIBRE 2 READER) DEVI Scan as needed for continuous glucose monitoring.     DENTA 5000 PLUS 1.1 % CREA dental cream Take by mouth.     empagliflozin (JARDIANCE) 25 MG TABS tablet Take 25 mg by mouth daily.     Ferrous Sulfate (IRON SLOW RELEASE) 142 (45 Fe) MG TBCR Take 1 tablet by mouth daily. 30 tablet 5   fluticasone (FLONASE) 50 MCG/ACT nasal spray USE 1 SPRAY  NASALLY ONCE A  DAY FOR ALLERGIES 32 g 1   glucose blood test strip USE TO CHECK SUGAR 4 TIMES  DAILY.     insulin glargine (LANTUS SOLOSTAR) 100 UNIT/ML Solostar Pen Inject 8 Units into the skin daily.     Insulin Pen Needle 32G X 4 MM MISC Use to inject  insulin once a day or as directed     Insulin Syringe-Needle U-100 (INSULIN SYRINGE 1CC/31GX5/16") 31G X 5/16" 1 ML MISC Use as directed once daily ( Dx code E 11.65)     levocetirizine (XYZAL) 5 MG tablet Take 1 tablet (5 mg total) by mouth at bedtime. 90 tablet 3   lisinopril (ZESTRIL) 10 MG tablet Take 1 tablet (10 mg total) by mouth daily. 90 tablet 3   metFORMIN (GLUCOPHAGE) 1000 MG tablet Take 1,000 mg by mouth 2 (two) times daily with a meal.     montelukast (SINGULAIR) 10 MG tablet Take 1 tablet (10 mg total) by mouth at bedtime. 90 tablet 3   Multiple Vitamin (MULTI-VITAMINS) TABS Take 1 tablet twice a day     omeprazole (PRILOSEC) 40 MG capsule TAKE 1 CAPSULE BY MOUTH DAILY 90 capsule 1   ONETOUCH DELICA LANCETS 33G MISC      ONETOUCH VERIO test strip      pilocarpine (SALAGEN) 5 MG tablet Take 5 mg by mouth 3 (three) times daily.     rosuvastatin (CRESTOR) 20 MG tablet Take 1 tablet (20 mg total) by mouth daily. 90 tablet 3   traZODone (DESYREL) 100 MG tablet Take 100 mg by mouth at bedtime.     buPROPion (WELLBUTRIN SR) 200 MG 12 hr tablet Take 1 tablet (200 mg total) by mouth 2 (two) times daily. 180 tablet 3   desvenlafaxine (PRISTIQ) 50 MG 24 hr tablet 2 qam 180 tablet 1   No current facility-administered medications for this visit.    Musculoskeletal: Strength & Muscle Tone: within normal limits Gait & Station: normal Patient leans: Left  Psychiatric Specialty Exam: Review of Systems  Blood pressure 134/70, pulse 69, height 5\' 11"  (1.803 m), weight 190 lb (86.2 kg).Body mass index is 26.5 kg/m.  General Appearance: Casual  Eye Contact:  Good  Speech:  NA  Volume:  Normal  Mood:  Dysphoric  Affect:  NA  Thought  Process:  Coherent  Orientation:    Thought Content:  Logical  Suicidal Thoughts:  No  Homicidal Thoughts:  No  Memory:  NA  Judgement:  Good  Insight:    Psychomotor Activity:  NA  Concentration:    Recall:  Good  Fund of Knowledge:Good  Language: Good  Akathisia:    Handed:  Right  AIMS (if indicated):  not done  Assets:  Desire for Improvement  ADL's:  Intact  Cognition: WNL  Sleep:  Good   Screenings: GAD-7    Flowsheet Row Counselor from 11/28/2022 in [facility]  Total GAD-7 Score 6      PHQ2-9    Flowsheet Row Counselor from 11/28/2022 in [facility] at [facility] Visit from 09/18/2022 in [facility] Visit from 06/15/2022 in [facility] Visit from 11/07/2020 in [facility] Visit from 09/15/2020 in [facility]  PHQ-2 Total Score 2 3 6 6 2   PHQ-9 Total Score 10 4 14 13 12        Assessment and Plan:    Today the patient is doing well with his first problem is major depression.  Will continue taking Pristiq as prescribed together with Wellbutrin.  His second problem is insomnia.  This seems to be pretty well controlled with as needed trazodone together with his new sleep machine.  Patient presently is in therapy which is great.  He is functioning very well.  He will return  to see me in 5 months. Collaboration of Care:   Patient/Guardian was advised Release of Information must be obtained prior to any record release in order to collaborate their care with an outside provider. Patient/Guardian was advised if they have not already done so to contact the registration department to sign all necessary forms in order for [place] to release information regarding their care.   Consent: Patient/Guardian gives verbal consent for treatment and assignment of benefits for services provided during this visit. Patient/Guardian expressed  understanding and agreed to proceed.   Gypsy Balsam, MD 7/10/20242:51 PM

## 2023-01-23 ENCOUNTER — Ambulatory Visit (HOSPITAL_COMMUNITY): Payer: 59 | Admitting: Clinical

## 2023-02-03 ENCOUNTER — Other Ambulatory Visit: Payer: Self-pay | Admitting: Family Medicine

## 2023-02-04 ENCOUNTER — Telehealth: Payer: Self-pay | Admitting: Family Medicine

## 2023-02-04 MED ORDER — FLUTICASONE PROPIONATE 50 MCG/ACT NA SUSP: NASAL | 0 refills | Status: DC

## 2023-02-04 NOTE — Telephone Encounter (Signed)
sent 

## 2023-02-04 NOTE — Telephone Encounter (Signed)
Fax from [facility]  Fluticasone

## 2023-02-06 ENCOUNTER — Ambulatory Visit (INDEPENDENT_AMBULATORY_CARE_PROVIDER_SITE_OTHER): Payer: 59 | Admitting: Clinical

## 2023-02-06 DIAGNOSIS — F330 Major depressive disorder, recurrent, mild: Secondary | ICD-10-CM

## 2023-02-06 NOTE — Progress Notes (Signed)
THERAPIST PROGRESS NOTE  Session Time: 2:05pm-3:02pm  Participation Level: Active  Behavioral Response: Casual Drowsy Anxious and Depressed  Type of Therapy: Individual Therapy  Treatment Goals addressed:  LTG: Hue will score less than 5 on the Generalized Anxiety Disorder 7 Scale (GAD-7)   STG: Nashton will practice problem solving skills 3 times per week for the next 4 weeks.   LTG: Reduce frequency, intensity, and duration of depression symptoms so that daily functioning is improved  LTG: Increase coping skills to manage depression and improve ability to perform daily activities  LTG: Arling will score less than 9 on the Patient Health Questionnaire (PHQ-9)   STG: Brandin will participate in at least 80% of scheduled individual psychotherapy sessions   STG: Brandonlee will identify cognitive patterns and beliefs that support depression  STG: Jaymison will practice behavioral activation skills 1 times per week for the next 26 weeks   ProgressTowards Goals: Progressing  Interventions: CBT and Supportive  Summary: [name] is a 67 y.o. male who presents with increased depression. He states he has always had depression and anxiety issues as long as he can remember, at least since elementary school age. He has previously been diagnosed with depression,anxiety and a eating disorder although he does not know exactly which one. Today he reported being "stressed out and depressed."  Even though his Pristiq was doubled and was initially helpful, he now is used to the increased amount so there is no longer any benefit,  He stated he goes through cycles of feeling bad and always has, but he does not know how long they last.  He is trying to recognize what is out of his control but this can be difficult.  He told of the various things bothering him recently, including feeling that a sales lady lied last year about providing a 2nd key to his car, now refusing to do so.  He had a tooth  pulled, very difficult because the infection went all the way to his sinuses.  He is having to go to [facility] to see a specialist about his tooth and is nervous about driving out of town like that.  He reports not having a a friend who could go with him.  He is afraid of his dental problems getting to the point he could not eat, because in the past when he had thrush he lost 60 pounds.   Currently he is at a plateau on his weight loss and wants to lose 5 more pounds, but forgets to joournal his food at the end of the day.  He also stated that he is stressed due to conflict at work with a man across the aisle from him.  This man really irritates him and he remains convinced it is done deliberately despite all CSW efforts to reframe it.  He does not want to lose his temper at work but is afraid he is dangerously close to doing so.  Finally he is to retire in 1-1/2 years and feels he is not financially ready.  His house is not paid for and he will not have sufficient income to make the mortgage payments.  CSW introduced the concept of CBT.  Specifically, CSW explained how our thoughts affect and often determine our feelings.  Appropriate examples were provided.  CSW provided patient with a graphic, easy worksheet of the most common cognitive distortions, and we reviewed these with a variety of examples given.  CSW moved on to explain how it will  be helpful for him to continue to review the worksheet after session and start to recognize thoughts that are distorted.  We then reviewed two different types of worksheets about "Challenging Negative Thoughts" and used the example of "the car saleslady lied when she said she did not remember promising me a second key" to work through the questions.  At the conclusion of that exercise, patient stated that he could replace this thought with "well, she sees thousands of people every year and maybe she really doesn't remember even though that still doesn't make it right for  me not to have a key."    We discussed keeping a short thought log until next session so that it can be discussed whether the thoughts could be a cognitive distortion leading to a feeling that is not based in truth, is not balanced, and is not helpful.  Patient was amenable and was provided a one-page hand-out on which to do a Thought Log before the next session.       Suicidal/Homicidal: No without intent/plan  Therapist Response: Patient is progressing AEB engaging in scheduled therapy session.  He presented oriented x5 and stated he was feeling "stressed out and depressed."  CSW evaluated patient's medication compliance and self-care since last session.  CSW taught CBT-related coping skills, specifically cognitive distortions and challenging negative thoughts.  Patient received these willingly and could discuss them in a way to indicate good comprehension.  CSW assigned patient the task of trying out these new coping skills (using a thought log for his cognitions to determine if they are distortions and using challenging negative thoughts questions) prior to next session on 8/21.  CSW encouraged patient to schedule more therapy sessions for the future, as he only has one more scheduled currently.  Throughout the session, CSW gave patient the opportunity to explore thoughts and feelings associated with current life situations and past/present external stressors.   CSW encouraged patient's expression of feelings and validated patient's thoughts using empathy, active listening, open body language, and unconditional positive regard.     Plan: Return again in 2 weeks.  Next appointment:  8/21  Diagnosis:  Mild episode of recurrent major depressive disorder (HCC)  Collaboration of Care: Psychiatrist AEB - provider and therapist can read notes in Epic  Patient/Guardian was advised Release of Information must be obtained prior to any record release in order to collaborate their care with an outside  provider. Patient/Guardian was advised if they have not already done so to contact the registration department to sign all necessary forms in order for [place] to release information regarding their care.   Consent: Patient/Guardian gives verbal consent for treatment and assignment of benefits for services provided during this visit. Patient/Guardian expressed understanding and agreed to proceed.   Lynnell Chad, LCSW 02/06/2023

## 2023-02-09 ENCOUNTER — Encounter (HOSPITAL_COMMUNITY): Payer: Self-pay | Admitting: Clinical

## 2023-02-20 ENCOUNTER — Ambulatory Visit (HOSPITAL_COMMUNITY): Payer: 59 | Admitting: Clinical

## 2023-02-21 ENCOUNTER — Telehealth: Payer: Self-pay

## 2023-02-21 MED ORDER — FLUTICASONE PROPIONATE 50 MCG/ACT NA SUSP: NASAL | 0 refills | Status: AC

## 2023-02-21 NOTE — Telephone Encounter (Signed)
Fax came in for fluticasone prop. Spr 50 MCG. Rx sent on 02/04/23 to [facility] on [address]

## 2023-02-22 ENCOUNTER — Telehealth: Payer: 59 | Admitting: Medical

## 2023-02-22 VITALS — Wt 179.0 lb

## 2023-02-22 DIAGNOSIS — L0591 Pilonidal cyst without abscess: Secondary | ICD-10-CM

## 2023-02-22 DIAGNOSIS — R051 Acute cough: Secondary | ICD-10-CM | POA: Diagnosis not present

## 2023-02-22 DIAGNOSIS — U071 COVID-19: Secondary | ICD-10-CM

## 2023-02-22 MED ORDER — AZITHROMYCIN 250 MG PO TABS: ORAL_TABLET | ORAL | 0 refills | Status: DC

## 2023-02-22 NOTE — Progress Notes (Signed)
Subjective:     Patient ID: [name], male   DOB: [birth year], 67 y.o.   MRN: [id number]  This visit type was conducted due to national recommendations for restrictions regarding the COVID-19 Pandemic (e.g. social distancing) in an effort to limit this patient's exposure and mitigate transmission in our community.  Due to their co-morbid illnesses, this patient is at least at moderate risk for complications without adequate follow up.  This format is felt to be most appropriate for this patient at this time.    Documentation for virtual audio and video telecommunications through [place] encounter:  The patient was located at home. The provider was located in the office. The patient did consent to this visit and is aware of possible charges through their insurance for this visit.  The other persons participating in this telemedicine service were none. Time spent on call was 20 minutes and in review of previous records 20 minutes total.  This virtual service is not related to other E/M service within previous 7 days.   HPI Chief Complaint  Patient presents with   Covid Positive    Covid positive since Tuesday. Symptoms started last week.- hot flashes, sweats, lightheaded, coughing, HA and coughing up phelgm   Virtual consult for illness.  Symptoms began over a week ago.   Tested positive for COVID Tuesday 3 days ago.  Symptoms include cough, headache, phlegm, hot flashes, sweats, lightheaded, fatigue.  Symptoms started   Just recently had dental work, was on antibiotics for that.  Has new recent pilondal cyst that drainages intermittent this past year.   This gives him hot flashes every now and then.     So it didn't dawn on him last week that this could be covid.     Feels some improved.  No NVD.  No wheezing or Sob.  No sore throat.  Cough is a little. No hemoptysis.  No fever, but has hot flashes.    Using no specific medications.  Recent glucose WNL lately, around  130.    No other aggravating or relieving factors. No other complaint.   Past Medical History:  Diagnosis Date   Anxiety    Arthritis    back, shoulders, left great toe   Candidiasis, mouth 03/22/2016   Carpal tunnel syndrome of right wrist 10/2014   Dental crowns present    Depression    Dyslipidemia    Ear fullness 08/30/2016   GERD (gastroesophageal reflux disease)    Hypertension    under control with med., per pt.; has been on med. x 15-20 yr.   Insulin dependent diabetes mellitus    Nummular eczema    Obesity    Onychomycosis 08/30/2016   Recurrent infections 03/22/2016   Sleep apnea    uses CPAP nightly   Current Outpatient Medications on File Prior to Visit  Medication Sig Dispense Refill   acetaminophen (TYLENOL) 500 MG tablet Take 500 mg by mouth every 6 (six) hours as needed for mild pain.     ASPIRIN LOW DOSE 81 MG EC tablet TAKE 1 TABLET BY MOUTH AT  BEDTIME . SWALLOW WHOLE. 30 tablet 0   Azelastine HCl 137 MCG/SPRAY SOLN Place 1 spray into both nostrils daily. 30 mL 3   B Complex-C (B-COMPLEX WITH VITAMIN C) tablet Take 1 tablet by mouth 2 (two) times daily.     buPROPion (WELLBUTRIN SR) 200 MG 12 hr tablet Take 1 tablet (200 mg total) by mouth 2 (two) times daily. 180 tablet 3  calcium-vitamin D 250-100 MG-UNIT per tablet Take 1 tablet by mouth 2 (two) times daily.     celecoxib (CELEBREX) 100 MG capsule TAKE 1 CAPSULE BY MOUTH TWICE  DAILY 180 capsule 3   cetirizine (ZYRTEC) 10 MG tablet Take 10 mg by mouth daily.     Continuous Blood Gluc Sensor (FREESTYLE LIBRE 2 SENSOR) MISC Inject 1 sensor to the skin every 14 days for continuous glucose monitoring.     Continuous Glucose Receiver (FREESTYLE LIBRE 2 READER) DEVI Scan as needed for continuous glucose monitoring.     DENTA 5000 PLUS 1.1 % CREA dental cream Take by mouth.     desvenlafaxine (PRISTIQ) 50 MG 24 hr tablet 2 qam 180 tablet 1   empagliflozin (JARDIANCE) 25 MG TABS tablet Take 25 mg by mouth daily.      Ferrous Sulfate (IRON SLOW RELEASE) 142 (45 Fe) MG TBCR Take 1 tablet by mouth daily. 30 tablet 5   fluticasone (FLONASE) 50 MCG/ACT nasal spray USE 1 SPRAY NASALLY ONCE A  DAY FOR ALLERGIES 32 g 0   insulin glargine (LANTUS SOLOSTAR) 100 UNIT/ML Solostar Pen Inject 8 Units into the skin daily.     levocetirizine (XYZAL) 5 MG tablet Take 1 tablet (5 mg total) by mouth at bedtime. 90 tablet 3   lisinopril (ZESTRIL) 10 MG tablet Take 1 tablet (10 mg total) by mouth daily. 90 tablet 3   metFORMIN (GLUCOPHAGE) 1000 MG tablet Take 1,000 mg by mouth 2 (two) times daily with a meal.     montelukast (SINGULAIR) 10 MG tablet Take 1 tablet (10 mg total) by mouth at bedtime. 90 tablet 3   Multiple Vitamin (MULTI-VITAMINS) TABS Take 1 tablet twice a day     omeprazole (PRILOSEC) 40 MG capsule TAKE 1 CAPSULE BY MOUTH DAILY 90 capsule 1   rosuvastatin (CRESTOR) 20 MG tablet Take 1 tablet (20 mg total) by mouth daily. 90 tablet 3   glucose blood test strip USE TO CHECK SUGAR 4 TIMES  DAILY.     Insulin Pen Needle 32G X 4 MM MISC Use to inject insulin once a day or as directed     Insulin Syringe-Needle U-100 (INSULIN SYRINGE 1CC/31GX5/16") 31G X 5/16" 1 ML MISC Use as directed once daily ( Dx code E 11.65)     ONETOUCH DELICA LANCETS 33G MISC      ONETOUCH VERIO test strip      pilocarpine (SALAGEN) 5 MG tablet Take 5 mg by mouth 3 (three) times daily.     traZODone (DESYREL) 100 MG tablet Take 100 mg by mouth at bedtime. (Patient not taking: Reported on 02/22/2023)     No current facility-administered medications on file prior to visit.    Review of Systems As in subjective    Objective:   Physical Exam Due to coronavirus pandemic stay at home measures, patient visit was virtual and they were not examined in person.   Wt 179 lb (81.2 kg)   BMI 24.97 kg/m   Gen wd, wn, nad No labored breathing or wheezing      Assessment:     Encounter Diagnoses  Name Primary?   COVID Yes   Acute cough     Pilonidal cyst        Plan:     COVID, cough-advised rest, hydration, Tylenol as needed for fever or aches, begin Mucinex DM to help with cough and congestion.  He is beyond the time window for Paxlovid to be helpful.  We discussed  potential complications.  If much worse over the weekend go to the emergency department.  If just worse sinus pressure or phlegm or mucus over the weekend can begin Z-Pak as below.  Discussed potential long-term effects of COVID such as fatigue, cough or other.  Recurrent pilonidal cyst-referral to general surgery  [name] was seen today for covid positive.  Diagnoses and all orders for this visit:  COVID  Acute cough  Pilonidal cyst -     Ambulatory referral to General Surgery  Other orders -     azithromycin (ZITHROMAX) 250 MG tablet; 2 tablets day 1, then 1 tablet days 2-4    F/u prn

## 2023-03-19 ENCOUNTER — Telehealth: Payer: Self-pay | Admitting: Family Medicine

## 2023-03-19 ENCOUNTER — Other Ambulatory Visit: Payer: Self-pay

## 2023-03-19 DIAGNOSIS — I152 Hypertension secondary to endocrine disorders: Secondary | ICD-10-CM

## 2023-03-19 MED ORDER — LISINOPRIL 10 MG PO TABS
10.0000 mg | ORAL_TABLET | Freq: Every day | ORAL | 0 refills | Status: DC
Start: 2023-03-19 — End: 2023-03-19

## 2023-03-19 NOTE — Telephone Encounter (Signed)
Ethelene Browns requesting a refill on lisinopril to [facility]) - [place], [place] - [address] [place]

## 2023-03-20 ENCOUNTER — Encounter: Payer: Self-pay | Admitting: Family Medicine

## 2023-03-20 ENCOUNTER — Ambulatory Visit: Payer: 59 | Admitting: Family Medicine

## 2023-03-20 VITALS — BP 102/56 | HR 68 | Resp 12 | Ht 70.75 in | Wt 194.8 lb

## 2023-03-20 DIAGNOSIS — G4733 Obstructive sleep apnea (adult) (pediatric): Secondary | ICD-10-CM | POA: Diagnosis not present

## 2023-03-20 DIAGNOSIS — F607 Dependent personality disorder: Secondary | ICD-10-CM

## 2023-03-20 DIAGNOSIS — E1159 Type 2 diabetes mellitus with other circulatory complications: Secondary | ICD-10-CM | POA: Diagnosis not present

## 2023-03-20 DIAGNOSIS — H25013 Cortical age-related cataract, bilateral: Secondary | ICD-10-CM

## 2023-03-20 DIAGNOSIS — Z23 Encounter for immunization: Secondary | ICD-10-CM | POA: Diagnosis not present

## 2023-03-20 DIAGNOSIS — E785 Hyperlipidemia, unspecified: Secondary | ICD-10-CM

## 2023-03-20 DIAGNOSIS — I152 Hypertension secondary to endocrine disorders: Secondary | ICD-10-CM

## 2023-03-20 DIAGNOSIS — Z978 Presence of other specified devices: Secondary | ICD-10-CM

## 2023-03-20 DIAGNOSIS — J309 Allergic rhinitis, unspecified: Secondary | ICD-10-CM | POA: Diagnosis not present

## 2023-03-20 DIAGNOSIS — K219 Gastro-esophageal reflux disease without esophagitis: Secondary | ICD-10-CM

## 2023-03-20 DIAGNOSIS — E1169 Type 2 diabetes mellitus with other specified complication: Secondary | ICD-10-CM

## 2023-03-20 DIAGNOSIS — B370 Candidal stomatitis: Secondary | ICD-10-CM

## 2023-03-20 MED ORDER — OMEPRAZOLE 40 MG PO CPDR
40.0000 mg | DELAYED_RELEASE_CAPSULE | Freq: Every day | ORAL | 3 refills | Status: DC
Start: 2023-03-20 — End: 2023-08-15

## 2023-03-20 MED ORDER — LISINOPRIL 10 MG PO TABS: 10.0000 mg | ORAL_TABLET | Freq: Every day | ORAL | 3 refills | Status: AC

## 2023-03-20 MED ORDER — MONTELUKAST SODIUM 10 MG PO TABS
10.0000 mg | ORAL_TABLET | Freq: Every day | ORAL | 3 refills | Status: AC
Start: 2023-03-20 — End: ?

## 2023-03-20 NOTE — Progress Notes (Signed)
Subjective:    Patient ID: [name], male    DOB: 08-10-1955, 67 y.o.   MRN: [id number]  HPI He is here for a med check appointment.  He is followed by endocrinology for his diabetes.  He states that he thinks his last hemoglobin A1c was 5.7.  He does have a pending appointment with surgery for treatment of his pilonidal cyst.  He continues to be followed by Dr. Donell Beers for his underlying psychological issues.  He does have reflux symptoms and has been using medications on a daily basis just out of habit.  He does have OSA and apparently an oral appliance is working.  He is getting ready to see an oral pathologist in [place] to further evaluate his oral thrush issues.  His allergies seem to be under good control.  He sees ophthalmology regularly.  Otherwise he has no particular concerns or complaints.   Review of Systems     Objective:    Physical Exam Alert and in no distress otherwise not examined       Assessment & Plan:   Problem List Items Addressed This Visit     Allergic rhinitis - Primary   Relevant Medications   montelukast (SINGULAIR) 10 MG tablet   Candidiasis, mouth   Cataract of both eyes   GERD   Relevant Medications   omeprazole (PRILOSEC) 40 MG capsule   Hyperlipidemia associated with type 2 diabetes mellitus (HCC)   Relevant Medications   lisinopril (ZESTRIL) 10 MG tablet   Other Relevant Orders   Lipid panel   Hypertension associated with diabetes (HCC)   Relevant Medications   lisinopril (ZESTRIL) 10 MG tablet   Obstructive sleep apnea   Passive-dependent personality disorder (HCC)   Uses self-applied continuous glucose monitoring device   Other Visit Diagnoses     Need for influenza vaccination       Relevant Orders   Flu Vaccine Trivalent High Dose (Fluad) (Completed)     He will continue to be followed by the various specialist for his underlying issues.  His medications were renewed.

## 2023-03-21 LAB — LIPID PANEL
Chol/HDL Ratio: 2.5 ratio (ref 0.0–5.0)
Cholesterol, Total: 118 mg/dL (ref 100–199)
HDL: 47 mg/dL (ref >39)
LDL Chol Calc (NIH): 47 mg/dL (ref 0–99)
Triglycerides: 138 mg/dL (ref 0–149)
VLDL Cholesterol Cal: 24 mg/dL (ref 5–40)

## 2023-05-02 ENCOUNTER — Ambulatory Visit: Payer: 59 | Admitting: Podiatry

## 2023-05-02 ENCOUNTER — Encounter: Payer: Self-pay | Admitting: Podiatry

## 2023-05-02 DIAGNOSIS — M2042 Other hammer toe(s) (acquired), left foot: Secondary | ICD-10-CM | POA: Diagnosis not present

## 2023-05-02 DIAGNOSIS — D2372 Other benign neoplasm of skin of left lower limb, including hip: Secondary | ICD-10-CM

## 2023-05-04 NOTE — Progress Notes (Signed)
He presents today chief complaint of a painful callus plantar aspect of the left heel.  He states that is not quite as bad as it usually is but I would like to have it trimmed anyway I would like to consider getting the third toe fixed because of the chronic pain that it has only distally and and as it tries to turn under the second toe.  He states that the end of the second toe also turned down.  He denies fever chills nausea vomit muscle aches pains calf pain back pain chest pain shortness of breath.  He denies changes in his past medical history medications and allergies.  Objective: Vital signs are stable alert oriented x 3.  There is no erythema edema cellulitis drainage or odor reactive hyperkeratotic lesion plantar aspect of the left foot by the heel continues to be present no open lesions or wounds.  This was debrided thoroughly today.  Reviewed radiographs demonstrating hammertoe deformities to toes 345 of the left foot also reviewed physical exam demonstrating flexible hammertoe deformities to these toes today.  He does have a mallet toe deformity where previous fusion of the PIPJ of the second digit of the left foot was performed.  These are tender on palpation but I think it will do well with fusion.  Assessment: Painful benign skin lesion plantar heel left.  Hammertoe deformities toes #3 #4 #5 of the left foot with mallet toe deformity DIPJ second digit left foot.  Plan: Discussed etiology pathology conservative therapies debrided benign skin lesion for him today.  Consented him for hammertoe repair #2 #3 #4 #5 3 and 4 with K wires.  He understands this and is amenable to it.  Having had it in the past.  We did discuss the possible postop complications the amount of time they will take for him to heal this and what to expect in general he understands and is amenable to it signed the consent forms today and we will follow-up with him in the near future for surgical intervention.

## 2023-05-08 ENCOUNTER — Telehealth: Payer: Self-pay | Admitting: Podiatry

## 2023-05-08 NOTE — Telephone Encounter (Signed)
DOS-05/24/2023  HAMMERTOE 3-5 [id number] TENOTOMY 2ND [id number]  [facility] EFFECTIVE DATE-07/02/2022  DEDUCTIBLE- $500.00 WITH REMAINING $0.00 OOP- $5000.00 WITH REMAINING $2813.71 COINSURANCE- 20%  PER THE [employer] WEBSITE PORTAL, PRIOR AUTH HAS BEEN APPROVED FOR CPT CODES 45409 AND 28010.GOOD FROM 05/24/2023 - 08/22/2023  AUTH REFERENCE #: [id number]

## 2023-05-15 ENCOUNTER — Telehealth: Payer: Self-pay | Admitting: Podiatry

## 2023-05-15 NOTE — Telephone Encounter (Signed)
SPOKE WITH TIA R. FROM [employer] TO ADD 2 ADDITIONAL UNITS TO CODE 19147 FOR PTS 05/24/2023 SURGERY. SHE STATED THAT SINCE THE CASE IS ALREADY CLOSED THAT SHE COULDN'T UPDATE IT, THAT THE OFFICE NEEDS TO CALL WITHIN 5 DAYS AFTER THE SCHEDULED SURGERY TO ADD THE 2 ADDITIONAL UNITS IN ORDER FOR THE ADDITIONAL UNITS TO BE COVERED. SHE ADVISED NOT TO PUT IN A NEW AUTH FOR THE 2 ADDITIONAL CODES DUE TO [employer] SEEING THAT AS A DUPLICATE AND DENYING IT.  CALL REF #: [id number]

## 2023-05-22 ENCOUNTER — Other Ambulatory Visit: Payer: Self-pay | Admitting: Podiatry

## 2023-05-22 MED ORDER — ONDANSETRON HCL 4 MG PO TABS: 4.0000 mg | ORAL_TABLET | Freq: Three times a day (TID) | ORAL | 0 refills | Status: DC | PRN

## 2023-05-22 MED ORDER — OXYCODONE-ACETAMINOPHEN 10-325 MG PO TABS
1.0000 | ORAL_TABLET | Freq: Three times a day (TID) | ORAL | 0 refills | Status: AC | PRN
Start: 2023-05-22 — End: 2023-05-29

## 2023-05-22 MED ORDER — CEPHALEXIN 500 MG PO CAPS: 500.0000 mg | ORAL_CAPSULE | Freq: Three times a day (TID) | ORAL | 0 refills | Status: DC

## 2023-05-24 DIAGNOSIS — M2042 Other hammer toe(s) (acquired), left foot: Secondary | ICD-10-CM | POA: Diagnosis not present

## 2023-05-27 ENCOUNTER — Telehealth: Payer: Self-pay | Admitting: Podiatry

## 2023-05-27 NOTE — Telephone Encounter (Signed)
CALLED [employer] TO GET 2 ADDITIONAL UNITS ADDED TO CODE 49702 FOR PTS 05/24/2023 SURGERY, AS A PREVIOUS [employer] REPRESENTATIVE ADVISED TO DO. WHEN SPEAKING WITH ANOTHER ADVOCATE TODAY, THEY STATED THAT THE PREVIOUS ADVOCATE WAS INCORRECT AND THAT FOR THE ADDITIONAL UNITS TO BE ADDED THEY WILL NEED TO BE SUBMITTED WHEN GOING THROUGH CLAIM PROCESSING ALONG WITH MEDICAL RECORDS SUPPORTING MEDICAL NECESSITY.  CALL REFERENCE #: [id number] (SPOKE WITH ABEL C)

## 2023-05-29 ENCOUNTER — Encounter: Payer: Self-pay | Admitting: Podiatry

## 2023-05-29 ENCOUNTER — Ambulatory Visit (INDEPENDENT_AMBULATORY_CARE_PROVIDER_SITE_OTHER): Payer: 59

## 2023-05-29 ENCOUNTER — Ambulatory Visit: Payer: 59 | Admitting: Podiatry

## 2023-05-29 DIAGNOSIS — Z9889 Other specified postprocedural states: Secondary | ICD-10-CM | POA: Diagnosis not present

## 2023-05-29 DIAGNOSIS — M2042 Other hammer toe(s) (acquired), left foot: Secondary | ICD-10-CM | POA: Diagnosis not present

## 2023-06-02 NOTE — Progress Notes (Signed)
Subjective:   Patient ID: [name], male   DOB: 67 y.o.   MRN: [id number]   HPI Patient states healing well overall feeling good with surgery that was done last week by Dr. Al Corpus   ROS      Objective:  Physical Exam  Neurovascular status intact negative Denna Haggard' sign noted wound edges coapted well toes in good alignment pins in good alignment third and fourth digits      Assessment:  Doing well post digital fusion arthroplasty left good alignment noted     Plan:  H&P reviewed and I went ahead today and I have recommended continued elevation compression immobilization and I reapplied sterile dressing.  Reappoint to recheck  X-rays indicate pins are intact digits 3 4 left with good alignment noted slight separation of the third toe but still in good position

## 2023-06-05 ENCOUNTER — Ambulatory Visit (HOSPITAL_COMMUNITY): Payer: 59 | Admitting: Psychiatry

## 2023-06-12 ENCOUNTER — Ambulatory Visit (HOSPITAL_COMMUNITY): Payer: 59 | Admitting: Psychiatry

## 2023-06-13 ENCOUNTER — Ambulatory Visit: Payer: 59 | Admitting: Podiatry

## 2023-06-13 ENCOUNTER — Encounter: Payer: Self-pay | Admitting: Podiatry

## 2023-06-13 DIAGNOSIS — Z9889 Other specified postprocedural states: Secondary | ICD-10-CM

## 2023-06-13 DIAGNOSIS — M2042 Other hammer toe(s) (acquired), left foot: Secondary | ICD-10-CM

## 2023-06-13 NOTE — Progress Notes (Signed)
[name] presents today for a postop visit #2 I date of surgery is 05/24/2023.  He had a hammertoe repair #3 #4 as well as #5 with tenotomy's.  When asked how he is doing he states that he is doing okay his 3 lesser toes the left foot are little bit tender and he has not had any more pain medicine.  He states that he thinks that the toes on the right foot are starting to do the same thing.  Objective: Vital signs are stable alert and oriented x 3.  Pulses are palpable.  There is no erythema edema salines drainage odor sutures are intact margins well coapted sutures removed today margins remain well coapted K wires are present to the third and fourth digits of the left foot and in good position.  Assessment: Well-healing surgical foot left.  Plan: Remove sutures today put a compression wrap on the fifth digit told him that he could change it recommended that he wear socks while at home.  He can wear his Darco shoe as well we dispensed that today for him.

## 2023-07-04 ENCOUNTER — Encounter: Payer: 59 | Admitting: Podiatry

## 2023-07-04 DIAGNOSIS — M2042 Other hammer toe(s) (acquired), left foot: Secondary | ICD-10-CM

## 2023-07-10 LAB — HM DIABETES EYE EXAM

## 2023-07-16 ENCOUNTER — Encounter: Payer: 59 | Admitting: Podiatry

## 2023-07-17 ENCOUNTER — Ambulatory Visit (INDEPENDENT_AMBULATORY_CARE_PROVIDER_SITE_OTHER): Payer: 59 | Admitting: Podiatry

## 2023-07-17 ENCOUNTER — Encounter: Payer: Self-pay | Admitting: Podiatry

## 2023-07-17 DIAGNOSIS — Z9889 Other specified postprocedural states: Secondary | ICD-10-CM

## 2023-07-17 NOTE — Progress Notes (Signed)
 Subjective:  Patient ID: [name], male    DOB: 03-14-1956,  MRN: [id number]  No chief complaint on file.   DOS: 05/24/23 Procedure: Left foot hammertoe repair 3-5 with tenotomy of right second digit  68 y.o. male returns for POV#3. Relates doing well.   Review of Systems: Negative except as noted in the HPI. Denies N/V/F/Ch.  Past Medical History:  Diagnosis Date   Anxiety    Arthritis    back, shoulders, left great toe   Candidiasis, mouth 03/22/2016   Carpal tunnel syndrome of right wrist 10/2014   Dental crowns present    Depression    Dyslipidemia    Ear fullness 08/30/2016   GERD (gastroesophageal reflux disease)    Hypertension    under control with med., per pt.; has been on med. x 15-20 yr.   Insulin dependent diabetes mellitus    Nummular eczema    Obesity    Onychomycosis 08/30/2016   Recurrent infections 03/22/2016   Sleep apnea    uses CPAP nightly    Current Outpatient Medications:    acetaminophen  (TYLENOL ) 500 MG tablet, Take 500 mg by mouth every 6 (six) hours as needed for mild pain (pain score 1-3)., Disp: , Rfl:    ASPIRIN  LOW DOSE 81 MG EC tablet, TAKE 1 TABLET BY MOUTH AT  BEDTIME . SWALLOW WHOLE., Disp: 30 tablet, Rfl: 0   Azelastine  HCl 137 MCG/SPRAY SOLN, Place 1 spray into both nostrils daily., Disp: 30 mL, Rfl: 3   azithromycin  (ZITHROMAX ) 250 MG tablet, 2 tablets day 1, then 1 tablet days 2-4, Disp: 6 tablet, Rfl: 0   B Complex-C (B-COMPLEX WITH VITAMIN C) tablet, Take 1 tablet by mouth 2 (two) times daily., Disp: , Rfl:    buPROPion  (WELLBUTRIN  SR) 200 MG 12 hr tablet, Take 1 tablet (200 mg total) by mouth 2 (two) times daily., Disp: 180 tablet, Rfl: 3   calcium -vitamin D 250-100 MG-UNIT per tablet, Take 1 tablet by mouth 2 (two) times daily., Disp: , Rfl:    celecoxib  (CELEBREX ) 100 MG capsule, TAKE 1 CAPSULE BY MOUTH TWICE  DAILY, Disp: 180 capsule, Rfl: 3   cephALEXin  (KEFLEX ) 500 MG capsule, Take 1 capsule (500 mg total) by mouth 3  (three) times daily., Disp: 30 capsule, Rfl: 0   cetirizine (ZYRTEC) 10 MG tablet, Take 10 mg by mouth daily., Disp: , Rfl:    Continuous Blood Gluc Sensor (FREESTYLE LIBRE 2 SENSOR) MISC, Inject 1 sensor to the skin every 14 days for continuous glucose monitoring., Disp: , Rfl:    Continuous Glucose Receiver (FREESTYLE LIBRE 2 READER) DEVI, Scan as needed for continuous glucose monitoring., Disp: , Rfl:    cyanocobalamin (VITAMIN B12) 1000 MCG tablet, Take 1,000 mcg by mouth daily., Disp: , Rfl:    DENTA 5000 PLUS 1.1 % CREA dental cream, Take by mouth., Disp: , Rfl:    desvenlafaxine  (PRISTIQ ) 50 MG 24 hr tablet, 2 qam, Disp: 180 tablet, Rfl: 1   empagliflozin (JARDIANCE) 25 MG TABS tablet, Take 25 mg by mouth daily., Disp: , Rfl:    Ferrous Sulfate  (IRON  SLOW RELEASE) 142 (45 Fe) MG TBCR, Take 1 tablet by mouth daily., Disp: 30 tablet, Rfl: 5   fluticasone  (FLONASE ) 50 MCG/ACT nasal spray, USE 1 SPRAY NASALLY ONCE A  DAY FOR ALLERGIES, Disp: 32 g, Rfl: 0   glucose blood test strip, USE TO CHECK SUGAR 4 TIMES  DAILY., Disp: , Rfl:    insulin glargine (LANTUS SOLOSTAR) 100 UNIT/ML  Solostar Pen, Inject 8 Units into the skin daily., Disp: , Rfl:    Insulin Pen Needle 32G X 4 MM MISC, Use to inject insulin once a day or as directed, Disp: , Rfl:    Insulin Syringe-Needle U-100 (INSULIN SYRINGE 1CC/31GX5/16") 31G X 5/16" 1 ML MISC, Use as directed once daily ( Dx code E 11.65), Disp: , Rfl:    levocetirizine (XYZAL ) 5 MG tablet, Take 1 tablet (5 mg total) by mouth at bedtime., Disp: 90 tablet, Rfl: 3   lisinopril  (ZESTRIL ) 10 MG tablet, Take 1 tablet (10 mg total) by mouth daily., Disp: 90 tablet, Rfl: 3   metFORMIN (GLUCOPHAGE) 1000 MG tablet, Take 1,000 mg by mouth 2 (two) times daily with a meal., Disp: , Rfl:    montelukast  (SINGULAIR ) 10 MG tablet, Take 1 tablet (10 mg total) by mouth at bedtime., Disp: 90 tablet, Rfl: 3   Multiple Vitamin (MULTI-VITAMINS) TABS, Take 1 tablet twice a day, Disp: ,  Rfl:    omeprazole  (PRILOSEC) 40 MG capsule, Take 1 capsule (40 mg total) by mouth daily., Disp: 90 capsule, Rfl: 3   ondansetron  (ZOFRAN ) 4 MG tablet, Take 1 tablet (4 mg total) by mouth every 8 (eight) hours as needed., Disp: 20 tablet, Rfl: 0   ONETOUCH DELICA LANCETS 33G MISC, , Disp: , Rfl:    ONETOUCH VERIO test strip, , Disp: , Rfl:    pilocarpine (SALAGEN) 5 MG tablet, Take 5 mg by mouth 3 (three) times daily., Disp: , Rfl:    rosuvastatin  (CRESTOR ) 20 MG tablet, Take 1 tablet (20 mg total) by mouth daily., Disp: 90 tablet, Rfl: 3   traZODone  (DESYREL ) 100 MG tablet, Take 100 mg by mouth at bedtime., Disp: , Rfl:   Social History   Tobacco Use  Smoking Status Former   Current packs/day: 0.00   Average packs/day: 1 pack/day for 33.0 years (33.0 ttl pk-yrs)   Types: Cigarettes   Start date: 23   Quit date: 07/03/2007   Years since quitting: 16.0  Smokeless Tobacco Never    Allergies  Allergen Reactions   Hydrogen Peroxide     Other Reaction(s): Other (See Comments)   Latex Itching   Objective:  There were no vitals filed for this visit. There is no height or weight on file to calculate BMI. Constitutional Well developed. Well nourished.  Vascular Foot warm and well perfused. Capillary refill normal to all digits.   Neurologic Normal speech. Oriented to person, place, and time. Epicritic sensation to light touch grossly present bilaterally.  Dermatologic Skin healing well without signs of infection. Skin edges well coapted without signs of infection.  Orthopedic: Tenderness to palpation noted about the surgical site.   Radiographs: Removal of pins from digits 2-3 with toes well aligned.  Assessment:  No diagnosis found. Plan:  Patient was evaluated and treated and all questions answered.  S/p foot surgery left -Progressing as expected post-operatively. -WB Status: WBAT in boot. May begin transition to regular shoes.  -Medications: n/a -Pins removed without  incident -Foot redressed.  Return in 3 weeks for recheck with Dr. Lara Plants.   No follow-ups on file.

## 2023-07-18 ENCOUNTER — Ambulatory Visit: Payer: 59 | Admitting: Family Medicine

## 2023-07-18 ENCOUNTER — Encounter: Payer: Self-pay | Admitting: Family Medicine

## 2023-07-18 VITALS — BP 118/74 | HR 77 | Temp 98.1°F | Wt 207.0 lb

## 2023-07-18 DIAGNOSIS — L0591 Pilonidal cyst without abscess: Secondary | ICD-10-CM | POA: Diagnosis not present

## 2023-07-18 DIAGNOSIS — Z23 Encounter for immunization: Secondary | ICD-10-CM

## 2023-07-18 DIAGNOSIS — R200 Anesthesia of skin: Secondary | ICD-10-CM

## 2023-07-18 NOTE — Progress Notes (Signed)
   Subjective:    Patient ID: [name], male    DOB: [birth year], 68 y.o.   MRN: [id number]  HPI He complains of a 67-month history of difficulty with decreased sensation to his jaw, right mucosa as well as right tongue.  He seems to relate this to taking Diflucan however is no longer taking the Diflucan.  His taste is normal he does not complain of any weakness to the tongue or jaw area.  The symptoms tend to wax and wane. No history of trauma to this area He also has a previous history of difficulty with a pilonidal cyst and has seen general surgery for this.  Apparently the procedure requiring packing would be difficult for him to do as he has no one who could help him packed that area.  Apparently there is a Engineer, petroleum that can do this but they are not in his medical system. Review of Systems     Objective:    Physical Exam Alert and in no distress.  He complains of decreased sensation to the jawline, right mucosa as well as right tongue however he has normal motion of the mouth, tongue.  He can purse his lips normally.       Assessment & Plan:  Numbness of right jaw - Plan: Ambulatory referral to Neurology  Need for COVID-19 vaccine - Plan: Pfizer Comirnaty Covid -19 Vaccine 28yrs and older  Need for vaccination against Streptococcus pneumoniae - Plan: Pneumococcal conjugate vaccine 20-valent (Prevnar 20)  Pilonidal cyst  The etiology of his numbness is really unclear and I will therefore send to neurology. I explained that I was not familiar with plastic surgery taking care of pilonidal cyst.  Since there is no one in his system that tends to handle this he would have to pay for the surgery out-of-pocket.  He is unwilling to try this.

## 2023-07-23 ENCOUNTER — Other Ambulatory Visit (HOSPITAL_COMMUNITY): Payer: Self-pay | Admitting: *Deleted

## 2023-07-23 MED ORDER — DESVENLAFAXINE SUCCINATE ER 50 MG PO TB24: ORAL_TABLET | ORAL | 0 refills | Status: DC

## 2023-08-06 ENCOUNTER — Other Ambulatory Visit: Payer: Self-pay

## 2023-08-06 ENCOUNTER — Encounter (HOSPITAL_COMMUNITY): Payer: Self-pay | Admitting: Psychiatry

## 2023-08-06 ENCOUNTER — Ambulatory Visit (HOSPITAL_COMMUNITY): Payer: 59 | Admitting: Psychiatry

## 2023-08-06 VITALS — BP 129/76 | HR 71 | Ht 71.0 in | Wt 207.8 lb

## 2023-08-06 DIAGNOSIS — F325 Major depressive disorder, single episode, in full remission: Secondary | ICD-10-CM | POA: Diagnosis not present

## 2023-08-06 MED ORDER — BUPROPION HCL ER (SR) 200 MG PO TB12: 200.0000 mg | ORAL_TABLET | Freq: Two times a day (BID) | ORAL | 3 refills | Status: DC

## 2023-08-06 MED ORDER — DESVENLAFAXINE SUCCINATE ER 50 MG PO TB24: ORAL_TABLET | ORAL | 0 refills | Status: DC

## 2023-08-06 NOTE — Progress Notes (Signed)
 Psychiatric Initial Adult Assessment   Patient Identification: [name] MRN:  [id number] Date of Evaluation:  08/06/2023 Referral Source: Dr. Dedra Dohmeier Chief Complaint: Depression Visit Diagnosis: Major depression recurrent  History of Present Illness:           Today the patient is seen in the office.  He is doing only fair.  He has been getting bothered at work.  This is not unusual for him.  He took 2 days off last week because he was so dysphoric about things that he thought he would blow up.  The precipitant that is making him scared is that he is having right facial numbness.  He has no muscle loss and no other focal neurological findings.  His primary care doctor has arranged for him to see his neurologist in about 3 weeks.  The patient takes an aspirin  every day his blood pressure is well controlled.  Once again he has no other neurological symptoms.  He does feel somewhat dysphoric but mainly I believe he is frightened.  At 68 years of age she has family members who have had heart attacks and strokes.  The patient continues to try to stay active and will try to go back to work.  He went Yesterday and he had an okay day.  The patient takes his medicines as prescribed. Associated Signs/Symptoms: Depression Symptoms:   (Hypo) Manic Symptoms:   Anxiety Symptoms:   Psychotic Symptoms:   PTSD Symptoms: NA  Past Psychiatric History:   Previous Psychotropic Medications: Yes   Substance Abuse History in the last 12 months:  No.  Consequences of Substance Abuse: NA  Past Medical History:  Past Medical History:  Diagnosis Date   Anxiety    Arthritis    back, shoulders, left great toe   Candidiasis, mouth 03/22/2016   Carpal tunnel syndrome of right wrist 10/2014   Dental crowns present    Depression    Dyslipidemia    Ear fullness 08/30/2016   GERD (gastroesophageal reflux disease)    Hypertension    under control with med., per pt.; has been on med. x 15-20  yr.   Insulin  dependent diabetes mellitus    Nummular eczema    Obesity    Onychomycosis 08/30/2016   Recurrent infections 03/22/2016   Sleep apnea    uses CPAP nightly    Past Surgical History:  Procedure Laterality Date   CARDIAC CATHETERIZATION  12/30/2006   mod. pulmonary HTN with preserved cardiac output and cardiac index   CARPAL TUNNEL RELEASE Right 10/14/2014   Procedure: RIGHT CARPAL TUNNEL RELEASE;  Surgeon: Franky Curia, MD;  Location: [facility];  Service: Orthopedics;  Laterality: Right;   CARPAL TUNNEL RELEASE Left 05/19/2015   Procedure: LEFT CARPAL TUNNEL RELEASE;  Surgeon: Franky Curia, MD;  Location: [facility];  Service: Orthopedics;  Laterality: Left;   CYST EXCISION     scalp and back   ESOPHAGOGASTRODUODENOSCOPY  03/25/2007   FOOT SURGERY Left    revision traumatic amputation of foot   ROUX-EN-Y PROCEDURE  07/28/2007   with lysis of adhesions    Family Psychiatric History: Family History:  Family History  Problem Relation Age of Onset   Heart disease Mother    Cancer Mother 54       Multiple myeloma   Diabetes Father    Diabetes Brother    Heart disease Brother 53       cardiomyopathy   Heart disease Brother 2  MI   Arthritis Paternal Grandmother    Diabetes Paternal Grandmother    Heart disease Paternal Grandmother    Stroke Paternal Grandmother     Social History:   Social History   Socioeconomic History   Marital status: Single    Spouse name: Not on file   Number of children: Not on file   Years of education: Not on file   Highest education level: Not on file  Occupational History   Not on file  Tobacco Use   Smoking status: Former    Current packs/day: 0.00    Average packs/day: 1 pack/day for 33.0 years (33.0 ttl pk-yrs)    Types: Cigarettes    Start date: 43    Quit date: 07/03/2007    Years since quitting: 16.1   Smokeless tobacco: Never  Substance and Sexual Activity   Alcohol use: Yes     Comment: seldom   Drug use: Yes    Frequency: 7.0 times per week    Types: Marijuana    Comment: 2 bowls per day   Sexual activity: Not Currently  Other Topics Concern   Not on file  Social History Narrative   Not on file   Social Drivers of Health   Financial Resource Strain: Not on file  Food Insecurity: Not on file  Transportation Needs: Not on file  Physical Activity: Not on file  Stress: Not on file  Social Connections: Not on file    Additional Social History:   Allergies:   Allergies  Allergen Reactions   Hydrogen Peroxide     Other Reaction(s): Other (See Comments)   Latex Itching    Metabolic Disorder Labs: Lab Results  Component Value Date   HGBA1C 5.2 04/10/2022   No results found for: PROLACTIN Lab Results  Component Value Date   CHOL 118 03/20/2023   TRIG 138 03/20/2023   HDL 47 03/20/2023   CHOLHDL 2.5 03/20/2023   VLDL 17 11/13/2010   LDLCALC 47 03/20/2023   LDLCALC 99 09/18/2022     Therapeutic Level Labs: No results found for: LITHIUM No results found for: CBMZ No results found for: VALPROATE  Current Medications: Current Outpatient Medications  Medication Sig Dispense Refill   acetaminophen  (TYLENOL ) 500 MG tablet Take 500 mg by mouth every 6 (six) hours as needed for mild pain (pain score 1-3). (Patient not taking: Reported on 07/18/2023)     ASPIRIN  LOW DOSE 81 MG EC tablet TAKE 1 TABLET BY MOUTH AT  BEDTIME . SWALLOW WHOLE. 30 tablet 0   Azelastine  HCl 137 MCG/SPRAY SOLN Place 1 spray into both nostrils daily. 30 mL 3   azithromycin  (ZITHROMAX ) 250 MG tablet 2 tablets day 1, then 1 tablet days 2-4 (Patient not taking: Reported on 07/18/2023) 6 tablet 0   B Complex-C (B-COMPLEX WITH VITAMIN C) tablet Take 1 tablet by mouth 2 (two) times daily.     buPROPion  (WELLBUTRIN  SR) 200 MG 12 hr tablet Take 1 tablet (200 mg total) by mouth 2 (two) times daily. 180 tablet 3   calcium -vitamin D 250-100 MG-UNIT per tablet Take 1 tablet by  mouth 2 (two) times daily.     celecoxib  (CELEBREX ) 100 MG capsule TAKE 1 CAPSULE BY MOUTH TWICE  DAILY 180 capsule 3   cephALEXin  (KEFLEX ) 500 MG capsule Take 1 capsule (500 mg total) by mouth 3 (three) times daily. (Patient not taking: Reported on 07/18/2023) 30 capsule 0   cetirizine (ZYRTEC) 10 MG tablet Take 10 mg by mouth daily.  Continuous Blood Gluc Sensor (FREESTYLE LIBRE 2 SENSOR) MISC Now on Freestyle Libre 3     Continuous Glucose Receiver (FREESTYLE LIBRE 2 READER) DEVI Scan as needed for continuous glucose monitoring.     cyanocobalamin (VITAMIN B12) 1000 MCG tablet Take 1,000 mcg by mouth daily.     DENTA 5000 PLUS 1.1 % CREA dental cream Take by mouth.     desvenlafaxine  (PRISTIQ ) 50 MG 24 hr tablet 2 qam 60 tablet 0   empagliflozin (JARDIANCE) 25 MG TABS tablet Take 25 mg by mouth daily.     Ferrous Sulfate  (IRON  SLOW RELEASE) 142 (45 Fe) MG TBCR Take 1 tablet by mouth daily. 30 tablet 5   fluticasone  (FLONASE ) 50 MCG/ACT nasal spray USE 1 SPRAY NASALLY ONCE A  DAY FOR ALLERGIES 32 g 0   glucose blood test strip USE TO CHECK SUGAR 4 TIMES  DAILY.     insulin  glargine (LANTUS SOLOSTAR) 100 UNIT/ML Solostar Pen Inject 8 Units into the skin daily.     Insulin  Pen Needle 32G X 4 MM MISC Use to inject insulin  once a day or as directed     Insulin  Syringe-Needle U-100 (INSULIN  SYRINGE 1CC/31GX5/16) 31G X 5/16 1 ML MISC Use as directed once daily ( Dx code E 11.65)     levocetirizine (XYZAL ) 5 MG tablet Take 1 tablet (5 mg total) by mouth at bedtime. 90 tablet 3   lisinopril  (ZESTRIL ) 10 MG tablet Take 1 tablet (10 mg total) by mouth daily. 90 tablet 3   metFORMIN (GLUCOPHAGE) 1000 MG tablet Take 1,000 mg by mouth 2 (two) times daily with a meal.     montelukast  (SINGULAIR ) 10 MG tablet Take 1 tablet (10 mg total) by mouth at bedtime. 90 tablet 3   Multiple Vitamin (MULTI-VITAMINS) TABS Take 1 tablet twice a day     omeprazole  (PRILOSEC) 40 MG capsule Take 1 capsule (40 mg total)  by mouth daily. (Patient not taking: Reported on 07/18/2023) 90 capsule 3   ondansetron  (ZOFRAN ) 4 MG tablet Take 1 tablet (4 mg total) by mouth every 8 (eight) hours as needed. (Patient not taking: Reported on 07/18/2023) 20 tablet 0   ONETOUCH DELICA LANCETS 33G MISC      ONETOUCH VERIO test strip      pilocarpine (SALAGEN) 5 MG tablet Take 5 mg by mouth 3 (three) times daily.     rosuvastatin  (CRESTOR ) 20 MG tablet Take 1 tablet (20 mg total) by mouth daily. 90 tablet 3   traZODone  (DESYREL ) 100 MG tablet Take 100 mg by mouth at bedtime. (Patient not taking: Reported on 07/18/2023)     No current facility-administered medications for this visit.    Musculoskeletal: Strength & Muscle Tone: within normal limits Gait & Station: normal Patient leans: Left  Psychiatric Specialty Exam: Review of Systems  Blood pressure 129/76, pulse 71, height 5' 11 (1.803 m), weight 207 lb 12.8 oz (94.3 kg).Body mass index is 28.98 kg/m.  General Appearance: Casual  Eye Contact:  Good  Speech:  NA  Volume:  Normal  Mood:  Dysphoric  Affect:  NA  Thought Process:  Coherent  Orientation:    Thought Content:  Logical  Suicidal Thoughts:  No  Homicidal Thoughts:  No  Memory:  NA  Judgement:  Good  Insight:    Psychomotor Activity:  NA  Concentration:    Recall:  Good  Fund of Knowledge:Good  Language: Good  Akathisia:    Handed:  Right  AIMS (if indicated):  not done  Assets:  Desire for Improvement  ADL's:  Intact  Cognition: WNL  Sleep:  Good   Screenings: GAD-7    Flowsheet Row Counselor from 11/28/2022 in [facility]  Total GAD-7 Score 6      PHQ2-9    Flowsheet Row Counselor from 11/28/2022 in [facility] at [facility] Visit from 09/18/2022 in [facility] Visit from 06/15/2022 in [facility] Visit from 11/07/2020 in [facility] Visit from 09/15/2020  in [facility]  PHQ-2 Total Score 2 3 6 6 2   PHQ-9 Total Score 10 4 14 13 12        Assessment and Plan:    This patient's diagnosis is major depression.  He will continue taking Pristiq  and Wellbutrin  as prescribed.  His second problem is a sleep disorder and he is on a mouthguard to help back.  The patient has not been seen by his therapist in a while he will make another appointment today.  The patient will return to see me in 6 weeks.  This will be after he has his neurology appointment.  I suspect to probably have a scan as well. Collaboration of Care:   Patient/Guardian was advised Release of Information must be obtained prior to any record release in order to collaborate their care with an outside provider. Patient/Guardian was advised if they have not already done so to contact the registration department to sign all necessary forms in order for us  to release information regarding their care.   Consent: Patient/Guardian gives verbal consent for treatment and assignment of benefits for services provided during this visit. Patient/Guardian expressed understanding and agreed to proceed.   Elna LILLETTE Lo, MD 2/4/20253:05 PM

## 2023-08-08 ENCOUNTER — Ambulatory Visit (INDEPENDENT_AMBULATORY_CARE_PROVIDER_SITE_OTHER): Payer: 59 | Admitting: Podiatry

## 2023-08-08 DIAGNOSIS — M2042 Other hammer toe(s) (acquired), left foot: Secondary | ICD-10-CM

## 2023-08-08 DIAGNOSIS — M2041 Other hammer toe(s) (acquired), right foot: Secondary | ICD-10-CM

## 2023-08-08 DIAGNOSIS — Z9889 Other specified postprocedural states: Secondary | ICD-10-CM

## 2023-08-08 NOTE — Progress Notes (Signed)
 [name] presents today date of surgery May 24, 2023.  Hammertoe repair #3 4 and 5 of the left foot states that he is doing very well no problems whatsoever.  Objective: Vital signs are stable he is alert oriented x 3 pulses are palpable.  Toes are straight there is minimal edema no erythema cellulitis drainage or odor.  All wounds are going on heal uneventfully.  He is very happy with outcome.  No pain on range of motion of the toes.  Assessment: Well-healing surgical toes.  Plan: Follow-up with me on an as-needed basis.

## 2023-08-13 ENCOUNTER — Ambulatory Visit: Payer: 59 | Admitting: Neurology

## 2023-08-13 ENCOUNTER — Encounter: Payer: Self-pay | Admitting: Neurology

## 2023-08-13 VITALS — BP 111/71 | HR 60 | Ht 71.0 in | Wt 210.0 lb

## 2023-08-13 DIAGNOSIS — K140 Glossitis: Secondary | ICD-10-CM | POA: Diagnosis not present

## 2023-08-13 DIAGNOSIS — Z9622 Myringotomy tube(s) status: Secondary | ICD-10-CM

## 2023-08-13 DIAGNOSIS — H6611 Chronic tubotympanic suppurative otitis media, right ear: Secondary | ICD-10-CM

## 2023-08-13 DIAGNOSIS — Z9884 Bariatric surgery status: Secondary | ICD-10-CM | POA: Diagnosis not present

## 2023-08-13 DIAGNOSIS — G4733 Obstructive sleep apnea (adult) (pediatric): Secondary | ICD-10-CM

## 2023-08-13 NOTE — Progress Notes (Signed)
Provider:  Melvyn Novas, MD  Primary Care Physician:  Ronnald Nian, MD [address] [place] [address]     Referring Provider: Ronnald Nian, Md [address] [place],  [place] [address]          Chief Complaint according to patient   Patient presents with:     New  problem in an established sleep Patient (Initial Visit), now uses a dental device. Here for ora numbness. Oral lichen Planus.            HISTORY OF PRESENT ILLNESS:  [name] is a 68 y.o. male diabetic patient who is here for a new problem in a  new visit on  08/13/2023 for: facial numbness.  Right side , buccal and facial sensation and points to  the region of impairment close to  the middle and lower  branch of the trigeminal neve.  Sometimes numb and sometimes tingling.  The extension can very-   and taste is affected, swallowing had at times been painful, leading to weight loss, dry mouth and tooth decay.    Chief concern according to patient :   I have the pleasure of seeing [name] today after his 18 months  hiatus, this time for numbness and facial dysesthesias in the right face only.  The patient had been undergoing dental work repeatedly he had a crown put in not long ago, he had history of dry mouth due to lichen planus, and he is now using a dental device to correct his sleep apnea.  He snores still with the dental device, not loud.  He also has chronic oral thrush.  Chronic glossitis for years,  right ear- serosa otitis, now with a tube placement. He caught COVID in December 2024, and  had only a mild case. He lost weight with the oral changes in sensation and loss of taste.  His weight today in clothing and with foot edema 210 pounds.  Sleep has been improving on dental device.      01/16/2022 from Dr Susann Givens for a Sleep medicine consultation..   Dr Amahia Madonia for sleep concerns. He had a sleep study in 2016 at [facility] , Dr Maple Hudson.Marland Kitchen He was set up with his  last CPAP machine in 12/2014 . He has insomnia. He wakes up several times during the night when he wears the cpap. He was seeing a counselor for anxiety/ insomnia who prescribed trazodone which helped.  He has had difficulties with hypersomnia, falling asleep while driving. In the process of weaning off pristiqe and wanting to start Trazodone again.     [name] has a past medical history of Anxiety, Arthritis, Bariatric Surgery in 2009, Candidiasis, mouth (03/22/2016), Carpal tunnel syndrome of right wrist (10/2014), Dental crowns present, Depression, Dyslipidemia, Ear fullness (08/30/2016), GERD (gastroesophageal reflux disease), Hypertension, Insulin dependent diabetes mellitus, Nummular eczema, history of Super-Obesity, Onychomycosis (08/30/2016), Oral; pain, Recurrent infections (03/22/2016),    HST :   02-06-2022.    Epworth sleepiness score: 15 /24. FSS score at 53/ 63 points, and Geriatric depression score at 8/ 15 points, followed by Dr Donell Beers.   BMI: 30.9 kg/m, 220 pounds.   Neck Circumference: 16" Total Recording Time (hours, min):   7 hours 59 minutes  Total Sleep Time (hours, min):     5 hours 41 minutes          Percent REM (%):     16.4%  Respiratory Indices:   Calculated pAHI (per hour):    11.4/h                        REM pAHI:    14.8/h                                           NREM pAHI:    10.8/h                          Positional AHI: The patient slept most of the night in nonsupine position, 134 minutes in the right lateral position with an AHI of 12/h, followed by 126 minutes in left lateral position with an AHI of 5.8/h and 82 minutes in supine sleep associated with an AHI of 19.3/h. Snoring level mean volume 42 dB 50% of the recorded time were associated with snoring.        Dr Plovsky's note; Major depression recurrent   History of Present Illness:     Today he started he believes the Pristiq has been very helpful.  He  takes 2 pills a day in conjunction with his Wellbutrin.  He has a new system for his sleep apnea and rarely has to take any trazodone.  The patient continues to work.  He worked for another 2 years and probably retired.  The patient spends a lot of time watching TV has recently bought some furniture and does a lot of gardening.  Generally patient is eating well has good energy and is functioning very well.  He drinks no alcohol and uses no drugs.   Associated Signs/Symptoms: Depression Symptoms:   (Hypo) Manic Symptoms:   Anxiety Symptoms:   Psychotic Symptoms:   PTSD Symptoms: NA                                               Review of Systems: Out of a complete 14 system review, the patient complains of only the following symptoms, and all other reviewed systems are negative.:   Social History   Socioeconomic History   Marital status: Single    Spouse name: Not on file   Number of children: Not on file   Years of education: Not on file   Highest education level: Not on file  Occupational History   Not on file  Tobacco Use   Smoking status: Former    Current packs/day: 0.00    Average packs/day: 1 pack/day for 33.0 years (33.0 ttl pk-yrs)    Types: Cigarettes    Start date: 19    Quit date: 07/03/2007    Years since quitting: 16.1   Smokeless tobacco: Never  Substance and Sexual Activity   Alcohol use: Yes    Comment: seldom   Drug use: Yes    Frequency: 7.0 times per week    Types: Marijuana    Comment: 2 bowls per day   Sexual activity: Not Currently  Other Topics Concern   Not on file  Social History Narrative   Pt lives alone    Pt works    Social Drivers of Corporate investment banker Strain: Not on file  Food Insecurity: Not on file  Transportation Needs:  Not on file  Physical Activity: Not on file  Stress: Not on file  Social Connections: Not on file    Family History  Problem Relation Age of Onset   Heart disease Mother    Cancer Mother 62        Multiple myeloma   Diabetes Father    Diabetes Brother    Heart disease Brother 60       cardiomyopathy   Heart disease Brother 72       MI   Arthritis Paternal Grandmother    Diabetes Paternal Grandmother    Heart disease Paternal Grandmother    Stroke Paternal Grandmother     Past Medical History:  Diagnosis Date   Anxiety    Arthritis    back, shoulders, left great toe   Candidiasis, mouth 03/22/2016   Carpal tunnel syndrome of right wrist 10/2014   Dental crowns present    Depression    Dyslipidemia    Ear fullness 08/30/2016   GERD (gastroesophageal reflux disease)    Hypertension    under control with med., per pt.; has been on med. x 15-20 yr.   Insulin dependent diabetes mellitus    Nummular eczema    Obesity    Onychomycosis 08/30/2016   Recurrent infections 03/22/2016   Sleep apnea    uses CPAP nightly    Past Surgical History:  Procedure Laterality Date   CARDIAC CATHETERIZATION  12/30/2006   "mod. pulmonary HTN with preserved cardiac output and cardiac index"   CARPAL TUNNEL RELEASE Right 10/14/2014   Procedure: RIGHT CARPAL TUNNEL RELEASE;  Surgeon: Betha Loa, MD;  Location: [facility];  Service: Orthopedics;  Laterality: Right;   CARPAL TUNNEL RELEASE Left 05/19/2015   Procedure: LEFT CARPAL TUNNEL RELEASE;  Surgeon: Betha Loa, MD;  Location: [facility];  Service: Orthopedics;  Laterality: Left;   CYST EXCISION     scalp and back   ESOPHAGOGASTRODUODENOSCOPY  03/25/2007   FOOT SURGERY Left    revision traumatic amputation of foot   ROUX-EN-Y PROCEDURE  07/28/2007   with lysis of adhesions     Current Outpatient Medications on File Prior to Visit  Medication Sig Dispense Refill   acetaminophen (TYLENOL) 500 MG tablet Take 500 mg by mouth every 6 (six) hours as needed for mild pain (pain score 1-3).     ASPIRIN LOW DOSE 81 MG EC tablet TAKE 1 TABLET BY MOUTH AT  BEDTIME . SWALLOW WHOLE. 30 tablet 0   Azelastine HCl 137  MCG/SPRAY SOLN Place 1 spray into both nostrils daily. 30 mL 3   B Complex-C (B-COMPLEX WITH VITAMIN C) tablet Take 1 tablet by mouth 2 (two) times daily.     buPROPion (WELLBUTRIN SR) 200 MG 12 hr tablet Take 1 tablet (200 mg total) by mouth 2 (two) times daily. 180 tablet 3   calcium-vitamin D 250-100 MG-UNIT per tablet Take 1 tablet by mouth 2 (two) times daily.     celecoxib (CELEBREX) 100 MG capsule TAKE 1 CAPSULE BY MOUTH TWICE  DAILY 180 capsule 3   cetirizine (ZYRTEC) 10 MG tablet Take 10 mg by mouth daily.     Continuous Blood Gluc Sensor (FREESTYLE LIBRE 2 SENSOR) MISC Now on Freestyle Libre 3     Continuous Glucose Receiver (FREESTYLE LIBRE 2 READER) DEVI Scan as needed for continuous glucose monitoring.     cyanocobalamin (VITAMIN B12) 1000 MCG tablet Take 1,000 mcg by mouth daily.     DENTA 5000 PLUS  1.1 % CREA dental cream Take by mouth.     desvenlafaxine (PRISTIQ) 50 MG 24 hr tablet 2 qam 60 tablet 0   empagliflozin (JARDIANCE) 25 MG TABS tablet Take 25 mg by mouth daily.     Ferrous Sulfate (IRON SLOW RELEASE) 142 (45 Fe) MG TBCR Take 1 tablet by mouth daily. 30 tablet 5   fluticasone (FLONASE) 50 MCG/ACT nasal spray USE 1 SPRAY NASALLY ONCE A  DAY FOR ALLERGIES 32 g 0   glucose blood test strip USE TO CHECK SUGAR 4 TIMES  DAILY.     insulin glargine (LANTUS SOLOSTAR) 100 UNIT/ML Solostar Pen Inject 8 Units into the skin daily.     Insulin Pen Needle 32G X 4 MM MISC Use to inject insulin once a day or as directed     Insulin Syringe-Needle U-100 (INSULIN SYRINGE 1CC/31GX5/16") 31G X 5/16" 1 ML MISC Use as directed once daily ( Dx code E 11.65)     levocetirizine (XYZAL) 5 MG tablet Take 1 tablet (5 mg total) by mouth at bedtime. 90 tablet 3   lisinopril (ZESTRIL) 10 MG tablet Take 1 tablet (10 mg total) by mouth daily. 90 tablet 3   metFORMIN (GLUCOPHAGE) 1000 MG tablet Take 1,000 mg by mouth 2 (two) times daily with a meal.     montelukast (SINGULAIR) 10 MG tablet Take 1 tablet  (10 mg total) by mouth at bedtime. 90 tablet 3   Multiple Vitamin (MULTI-VITAMINS) TABS Take 1 tablet twice a day     omeprazole (PRILOSEC) 40 MG capsule Take 1 capsule (40 mg total) by mouth daily. 90 capsule 3   ONETOUCH DELICA LANCETS 33G MISC      ONETOUCH VERIO test strip      pilocarpine (SALAGEN) 5 MG tablet Take 5 mg by mouth 3 (three) times daily.     rosuvastatin (CRESTOR) 20 MG tablet Take 1 tablet (20 mg total) by mouth daily. 90 tablet 3   traZODone (DESYREL) 100 MG tablet Take 100 mg by mouth at bedtime.     No current facility-administered medications on file prior to visit.    Allergies  Allergen Reactions   Hydrogen Peroxide     Other Reaction(s): Other (See Comments)   Latex Itching     DIAGNOSTIC DATA (LABS, IMAGING, TESTING) - I reviewed patient records, labs, notes, testing and imaging myself where available.  Lab Results  Component Value Date   WBC 8.4 10/29/2022   HGB 14.7 10/29/2022   HCT 42.6 10/29/2022   MCV 94 10/29/2022   PLT 233 10/29/2022      Component Value Date/Time   NA 143 10/29/2022 1302   K 4.5 10/29/2022 1302   CL 102 10/29/2022 1302   CO2 23 10/29/2022 1302   GLUCOSE 93 10/29/2022 1302   GLUCOSE 87 04/05/2018 1542   BUN 17 10/29/2022 1302   CREATININE 0.77 10/29/2022 1302   CREATININE 0.77 10/10/2015 0001   CALCIUM 9.9 10/29/2022 1302   PROT 6.3 07/03/2022 1630   ALBUMIN 4.4 07/03/2022 1630   AST 19 07/03/2022 1630   ALT 15 07/03/2022 1630   ALKPHOS 68 07/03/2022 1630   BILITOT 0.4 07/03/2022 1630   GFRNONAA >60 04/05/2018 1542   GFRAA >60 04/05/2018 1542   Lab Results  Component Value Date   CHOL 118 03/20/2023   HDL 47 03/20/2023   LDLCALC 47 03/20/2023   TRIG 138 03/20/2023   CHOLHDL 2.5 03/20/2023   Lab Results  Component Value Date   HGBA1C  5.2 04/10/2022   No results found for: "VITAMINB12" Lab Results  Component Value Date   TSH 0.376 (L) 10/29/2022    PHYSICAL EXAM:  Today's Vitals   08/13/23 1305   BP: 111/71  Pulse: 60  Weight: 210 lb (95.3 kg)  Height: 5\' 11"  (1.803 m)   Body mass index is 29.29 kg/m.   Wt Readings from Last 3 Encounters:  08/13/23 210 lb (95.3 kg)  07/18/23 207 lb (93.9 kg)  03/20/23 194 lb 12.8 oz (88.4 kg)     Ht Readings from Last 3 Encounters:  08/13/23 5\' 11"  (1.803 m)  03/20/23 5' 10.75" (1.797 m)  09/18/22 5\' 10"  (1.778 m)      General: The patient is awake, alert and appears not in acute distress. The patient is well groomed. Head: Normocephalic, atraumatic. Neck is supple.  Mallampati 2,  neck circumference:15.5 ( down from 17 ")  inches .  Nasal airflow congested - reportedly allergic. No lichen or candidiasis is seen here today !  Dental status:   poor.  Cardiovascular:  Regular rate and cardiac rhythm by pulse,  without distended neck veins. Respiratory: Lungs are clear to auscultation.  Skin:  With major  evidence of ankle edema,pitting  Trunk: The patient's posture is erect.   NEUROLOGIC EXAM: The patient is awake and alert, oriented to place and time.   Memory subjective described as intact.  Attention span & concentration ability appears normal.  Speech is fluent,  without  dysarthria, dysphonia or aphasia.  Mood and affect are appropriate.   Cranial nerves: no loss of smell or taste reported  Pupils are equal and briskly reactive to light.  Extraocular movements in vertical and horizontal planes were intact and without nystagmus. No Diplopia. Visual fields by finger perimetry are intact. Hearing was intact to soft voice and finger rubbing.    Facial sensation to fine touch impaired in the left distribution of the face, trigeminal, not facial.   Facial motor strength is symmetric and tongue and uvula move midline.  Neck ROM : rotation, tilt and flexion extension were normal for age and shoulder shrug was symmetrical.     ASSESSMENT AND PLAN 68 y.o. year old male  here with:    1)  4 months ago onset of  facial  dysesthesias and numbness in the right face, no neuralgia, no TMJ click, no chewing related claudication or pain.  Unrelated to Covid infection ? in onset time close together. Could  is have been the last dental crown ?   I see no blisters, or rash , no lesions in the mucosa and  on the tongue , nothing externally looks different left versus right. No burning mouth.  He sometimes bites tongue or lips on the right , but he is not in pain. Ear pressure , too.   Plan:  assuming the 2 lower branches of the trigeminal nerve are affected, and the result is not painful, should anything be done?  I can offer an sinus CT to look at fluid levels , and offered magic mouthwash for prn use if  burning occurs.   I will refer to ENT for  sinus imaging.   Prn  follow up either personally or through our NP  I would like to thank Ronnald Nian, MD and Ronnald Nian, Md [address] [place],  [place] [address] for allowing me to meet with and to take care of this pleasant patient.    After spending a total time  of  35  minutes face to face and additional time for physical and neurologic examination, review of laboratory studies,  personal review of imaging studies, reports and results of other testing and review of referral information / records as far as provided in visit,   Electronically signed by: Melvyn Novas, MD 08/13/2023 1:12 PM  [facility] Board certified by The [employer] of Sleep Medicine and Diplomate of the [employer] of Sleep Medicine. Board certified In Neurology through the ABPN, Fellow of the [employer] of Neu[employer].

## 2023-08-15 ENCOUNTER — Ambulatory Visit: Payer: 59 | Admitting: Family Medicine

## 2023-08-15 VITALS — BP 120/70 | HR 73 | Wt 210.0 lb

## 2023-08-15 DIAGNOSIS — R635 Abnormal weight gain: Secondary | ICD-10-CM | POA: Diagnosis not present

## 2023-08-15 DIAGNOSIS — S90211A Contusion of right great toe with damage to nail, initial encounter: Secondary | ICD-10-CM | POA: Diagnosis not present

## 2023-08-15 NOTE — Progress Notes (Signed)
   Subjective:    Patient ID: [name], male    DOB: 1956-04-24, 68 y.o.   MRN: [id number]  HPI He is here for evaluation of discoloration of his right great toe and also the fact that he is gained weight and notes that especially in his legs.  He has had no injury to his toe recently.  Also of note is the fact that he has had foot surgery which limited his physical activities.  His main physical activity is walking which she has not been able to do.   Review of Systems     Objective:    Physical Exam Exam of the right great toe does show growth of tissue at the base of approximately 0.5 cm in discoloration in the subungual area of purplish material.       Assessment & Plan:  Subungual hematoma of great toe of right foot, initial encounter  Weight gain Explained that he has evidence of a subungual hematoma from trauma although he does not remember this.  Explained that this will slowly dissipate as he keeps trimming his nail. I then discussed the weight gain with him and explained that since he has not been walking, his weight is gone up.  Suggested that he ride a bicycle, use an elliptical or potentially go to the gym and also use the pool. Also recommend that he get RSV injection.

## 2023-08-15 NOTE — Addendum Note (Signed)
Addended by: Ronnald Nian on: 08/15/2023 03:37 PM   Modules accepted: Level of Service

## 2023-08-27 ENCOUNTER — Encounter: Payer: Self-pay | Admitting: Internal Medicine

## 2023-08-28 ENCOUNTER — Other Ambulatory Visit (HOSPITAL_COMMUNITY): Payer: Self-pay | Admitting: Psychiatry

## 2023-09-05 ENCOUNTER — Other Ambulatory Visit (HOSPITAL_COMMUNITY): Payer: Self-pay

## 2023-09-05 MED ORDER — DESVENLAFAXINE SUCCINATE ER 50 MG PO TB24: ORAL_TABLET | ORAL | 0 refills | Status: DC

## 2023-09-05 MED ORDER — BUPROPION HCL ER (SR) 200 MG PO TB12: 200.0000 mg | ORAL_TABLET | Freq: Two times a day (BID) | ORAL | 0 refills | Status: DC

## 2023-09-10 ENCOUNTER — Other Ambulatory Visit: Payer: Self-pay | Admitting: Family Medicine

## 2023-09-10 ENCOUNTER — Other Ambulatory Visit: Payer: Self-pay | Admitting: Surgical

## 2023-09-10 ENCOUNTER — Ambulatory Visit: Payer: 59 | Admitting: Neurology

## 2023-09-10 DIAGNOSIS — J309 Allergic rhinitis, unspecified: Secondary | ICD-10-CM

## 2023-09-10 DIAGNOSIS — E1169 Type 2 diabetes mellitus with other specified complication: Secondary | ICD-10-CM

## 2023-10-01 ENCOUNTER — Ambulatory Visit (HOSPITAL_BASED_OUTPATIENT_CLINIC_OR_DEPARTMENT_OTHER): Payer: 59 | Admitting: Psychiatry

## 2023-10-01 ENCOUNTER — Other Ambulatory Visit: Payer: Self-pay

## 2023-10-01 ENCOUNTER — Encounter (HOSPITAL_COMMUNITY): Payer: Self-pay | Admitting: Psychiatry

## 2023-10-01 VITALS — BP 113/77 | HR 61 | Ht 71.0 in | Wt 203.0 lb

## 2023-10-01 DIAGNOSIS — F325 Major depressive disorder, single episode, in full remission: Secondary | ICD-10-CM

## 2023-10-01 MED ORDER — BUPROPION HCL ER (SR) 200 MG PO TB12: 200.0000 mg | ORAL_TABLET | Freq: Two times a day (BID) | ORAL | 4 refills | Status: DC

## 2023-10-01 MED ORDER — DESVENLAFAXINE SUCCINATE ER 50 MG PO TB24: ORAL_TABLET | ORAL | 0 refills | Status: DC

## 2023-10-01 NOTE — Progress Notes (Signed)
 Psychiatric Initial Adult Assessment   Patient Identification: BANE HAGY MRN:  [id number] Date of Evaluation:  10/01/2023 Referral Source: Dr. Porfirio Mylar Dohmeier Chief Complaint: Depression Visit Diagnosis: Major depression recurrent  History of Present Illness:        Today the patient is seen in the office.  His facial discomfort his numbness he says his started to get worse.  He is seeing a neurologist who is referred him to an ENT doctor will not be able to see him for many many months.  The neurologist apparently felt his complaints were not neurologically based.  He does not describe any other neurological symptomatology.  His neurological exam was obviously normal.  The patient fortunately denies any depression.  He denies anhedonia.  He loves walking his dog.  He watches television.  He is sleeping and eating well.  He has been back to work now and is doing well there.  He is having less conflicts there.  The patient is a Neurosurgeon.  He works for [employer].  Note is the patient has had gastric bypass surgery hence takes an appropriate dose of Wellbutrin.  The patient likes to read.  He reads a lot of fiction.  The patient feels stable and is functioning very well.  He has no plans to retire anytime soon.  Presently he has no romantic relationships.  He owns his own home and he likes it. Associated Signs/Symptoms: Depression Symptoms:   (Hypo) Manic Symptoms:   Anxiety Symptoms:   Psychotic Symptoms:   PTSD Symptoms: NA  Past Psychiatric History:   Previous Psychotropic Medications: Yes   Substance Abuse History in the last 12 months:  No.  Consequences of Substance Abuse: NA  Past Medical History:  Past Medical History:  Diagnosis Date   Anxiety    Arthritis    back, shoulders, left great toe   Candidiasis, mouth 03/22/2016   Carpal tunnel syndrome of right wrist 10/2014   Dental crowns present    Depression    Dyslipidemia    Ear fullness  08/30/2016   GERD (gastroesophageal reflux disease)    Hypertension    under control with med., per pt.; has been on med. x 15-20 yr.   Insulin dependent diabetes mellitus    Nummular eczema    Obesity    Onychomycosis 08/30/2016   Recurrent infections 03/22/2016   Sleep apnea    uses CPAP nightly    Past Surgical History:  Procedure Laterality Date   CARDIAC CATHETERIZATION  12/30/2006   "mod. pulmonary HTN with preserved cardiac output and cardiac index"   CARPAL TUNNEL RELEASE Right 10/14/2014   Procedure: RIGHT CARPAL TUNNEL RELEASE;  Surgeon: Betha Loa, MD;  Location: [facility];  Service: Orthopedics;  Laterality: Right;   CARPAL TUNNEL RELEASE Left 05/19/2015   Procedure: LEFT CARPAL TUNNEL RELEASE;  Surgeon: Betha Loa, MD;  Location: [facility];  Service: Orthopedics;  Laterality: Left;   CYST EXCISION     scalp and back   ESOPHAGOGASTRODUODENOSCOPY  03/25/2007   FOOT SURGERY Left    revision traumatic amputation of foot   ROUX-EN-Y PROCEDURE  07/28/2007   with lysis of adhesions    Family Psychiatric History: Family History:  Family History  Problem Relation Age of Onset   Heart disease Mother    Cancer Mother 40       Multiple myeloma   Diabetes Father    Diabetes Brother    Heart disease Brother 42  cardiomyopathy   Heart disease Brother 40       MI   Arthritis Paternal Grandmother    Diabetes Paternal Grandmother    Heart disease Paternal Grandmother    Stroke Paternal Grandmother     Social History:   Social History   Socioeconomic History   Marital status: Single    Spouse name: Not on file   Number of children: Not on file   Years of education: Not on file   Highest education level: Not on file  Occupational History   Not on file  Tobacco Use   Smoking status: Former    Current packs/day: 0.00    Average packs/day: 1 pack/day for 33.0 years (33.0 ttl pk-yrs)    Types: Cigarettes    Start date: 89     Quit date: 07/03/2007    Years since quitting: 16.2   Smokeless tobacco: Never  Substance and Sexual Activity   Alcohol use: Yes    Comment: seldom   Drug use: Yes    Frequency: 7.0 times per week    Types: Marijuana    Comment: 2 bowls per day   Sexual activity: Not Currently  Other Topics Concern   Not on file  Social History Narrative   Pt lives alone    Pt works    Social Drivers of Corporate investment banker Strain: Not on file  Food Insecurity: Not on file  Transportation Needs: Not on file  Physical Activity: Not on file  Stress: Not on file  Social Connections: Not on file    Additional Social History:   Allergies:   Allergies  Allergen Reactions   Hydrogen Peroxide     Other Reaction(s): Other (See Comments)   Latex Itching    Metabolic Disorder Labs: Lab Results  Component Value Date   HGBA1C 5.2 04/10/2022   No results found for: "PROLACTIN" Lab Results  Component Value Date   CHOL 118 03/20/2023   TRIG 138 03/20/2023   HDL 47 03/20/2023   CHOLHDL 2.5 03/20/2023   VLDL 17 11/13/2010   LDLCALC 47 03/20/2023   LDLCALC 99 09/18/2022     Therapeutic Level Labs: No results found for: "LITHIUM" No results found for: "CBMZ" No results found for: "VALPROATE"  Current Medications: Current Outpatient Medications  Medication Sig Dispense Refill   acetaminophen (TYLENOL) 500 MG tablet Take 500 mg by mouth every 6 (six) hours as needed for mild pain (pain score 1-3).     ASPIRIN LOW DOSE 81 MG EC tablet TAKE 1 TABLET BY MOUTH AT  BEDTIME . SWALLOW WHOLE. 30 tablet 0   Azelastine HCl 137 MCG/SPRAY SOLN Place 1 spray into both nostrils daily. 30 mL 3   B Complex-C (B-COMPLEX WITH VITAMIN C) tablet Take 1 tablet by mouth 2 (two) times daily.     calcium-vitamin D 250-100 MG-UNIT per tablet Take 1 tablet by mouth 2 (two) times daily.     celecoxib (CELEBREX) 100 MG capsule TAKE 1 CAPSULE BY MOUTH TWICE  DAILY 180 capsule 3   cetirizine (ZYRTEC) 10 MG  tablet Take 10 mg by mouth daily.     Continuous Blood Gluc Sensor (FREESTYLE LIBRE 2 SENSOR) MISC Now on Freestyle Libre 3     Continuous Glucose Receiver (FREESTYLE LIBRE 2 READER) DEVI Scan as needed for continuous glucose monitoring.     cyanocobalamin (VITAMIN B12) 1000 MCG tablet Take 1,000 mcg by mouth daily.     DENTA 5000 PLUS 1.1 % CREA dental cream  Take by mouth.     empagliflozin (JARDIANCE) 25 MG TABS tablet Take 25 mg by mouth daily.     Ferrous Sulfate (IRON SLOW RELEASE) 142 (45 Fe) MG TBCR Take 1 tablet by mouth daily. 30 tablet 5   fluticasone (FLONASE) 50 MCG/ACT nasal spray USE 1 SPRAY NASALLY ONCE A  DAY FOR ALLERGIES 32 g 0   glucose blood test strip USE TO CHECK SUGAR 4 TIMES  DAILY.     insulin glargine (LANTUS SOLOSTAR) 100 UNIT/ML Solostar Pen Inject 8 Units into the skin daily.     Insulin Pen Needle 32G X 4 MM MISC Use to inject insulin once a day or as directed     Insulin Syringe-Needle U-100 (INSULIN SYRINGE 1CC/31GX5/16") 31G X 5/16" 1 ML MISC Use as directed once daily ( Dx code E 11.65)     levocetirizine (XYZAL) 5 MG tablet TAKE 1 TABLET BY MOUTH AT  BEDTIME 90 tablet 0   lisinopril (ZESTRIL) 10 MG tablet Take 1 tablet (10 mg total) by mouth daily. 90 tablet 3   metFORMIN (GLUCOPHAGE) 1000 MG tablet Take 1,000 mg by mouth 2 (two) times daily with a meal.     montelukast (SINGULAIR) 10 MG tablet Take 1 tablet (10 mg total) by mouth at bedtime. 90 tablet 3   Multiple Vitamin (MULTI-VITAMINS) TABS Take 1 tablet twice a day     ONETOUCH DELICA LANCETS 33G MISC      ONETOUCH VERIO test strip      pilocarpine (SALAGEN) 5 MG tablet Take 5 mg by mouth 3 (three) times daily.     rosuvastatin (CRESTOR) 20 MG tablet TAKE 1 TABLET BY MOUTH DAILY 90 tablet 0   traZODone (DESYREL) 100 MG tablet Take 100 mg by mouth at bedtime.     buPROPion (WELLBUTRIN SR) 200 MG 12 hr tablet Take 1 tablet (200 mg total) by mouth 2 (two) times daily. 180 tablet 4   desvenlafaxine (PRISTIQ)  50 MG 24 hr tablet 2 qam 180 tablet 0   No current facility-administered medications for this visit.    Musculoskeletal: Strength & Muscle Tone: within normal limits Gait & Station: normal Patient leans: Left  Psychiatric Specialty Exam: Review of Systems  Blood pressure 113/77, pulse 61, height 5\' 11"  (1.803 m), weight 203 lb (92.1 kg).Body mass index is 28.31 kg/m.  General Appearance: Casual  Eye Contact:  Good  Speech:  NA  Volume:  Normal  Mood:  Dysphoric  Affect:  NA  Thought Process:  Coherent  Orientation:    Thought Content:  Logical  Suicidal Thoughts:  No  Homicidal Thoughts:  No  Memory:  NA  Judgement:  Good  Insight:    Psychomotor Activity:  NA  Concentration:    Recall:  Good  Fund of Knowledge:Good  Language: Good  Akathisia:    Handed:  Right  AIMS (if indicated):  not done  Assets:  Desire for Improvement  ADL's:  Intact  Cognition: WNL  Sleep:  Good   Screenings: GAD-7    Flowsheet Row Counselor from 11/28/2022 in [facility]  Total GAD-7 Score 6      PHQ2-9    Flowsheet Row Counselor from 11/28/2022 in [facility] at [facility] Visit from 09/18/2022 in [facility] Visit from 06/15/2022 in [facility] Visit from 11/07/2020 in [facility] Visit from 09/15/2020 in [facility]  PHQ-2 Total Score 2 3  6 6 2   PHQ-9 Total Score 10 4 14 13 12        Assessment and Plan:    This patient's diagnosis is major depression in remission.  He takes a relatively high dose of Pristiq 100 mg.  He also takes Wellbutrin 200 mg slow release.  The patient actually is in therapy in the setting although he sees the therapist only every month or 2.  He says the therapist has been very helpful for him.  Patient's plans is to call his neurologist back and report that his facial numbness seems to be worse.  This patient  she will return to see me in 4 months. Collaboration of Care:   Patient/Guardian was advised Release of Information must be obtained prior to any record release in order to collaborate their care with an outside provider. Patient/Guardian was advised if they have not already done so to contact the registration department to sign all necessary forms in order for [place] to release information regarding their care.   Consent: Patient/Guardian gives verbal consent for treatment and assignment of benefits for services provided during this visit. Patient/Guardian expressed understanding and agreed to proceed.   Gypsy Balsam, MD 4/1/20251:39 PM

## 2023-10-03 ENCOUNTER — Other Ambulatory Visit: Payer: Self-pay | Admitting: Family Medicine

## 2023-10-03 DIAGNOSIS — E1169 Type 2 diabetes mellitus with other specified complication: Secondary | ICD-10-CM

## 2023-10-10 ENCOUNTER — Telehealth: Payer: Self-pay | Admitting: Neurology

## 2023-10-10 NOTE — Telephone Encounter (Signed)
 Pt called stating that the whole R side of his face is numb and getting worse. Pt states that he is having a lot of difficulty and would like to speak to the RN. Please advise.

## 2023-10-10 NOTE — Telephone Encounter (Signed)
 Cld Pt, no answer, LVM for call back.

## 2023-10-11 NOTE — Telephone Encounter (Signed)
 Pt called back. Clarified with the patient that he is not having issues with speaking but the numbness that he has been having is worsening on the right side of his face. States that it has spread slightly from where it was originally. States numbness moved from jawline into the forehead. A referral was placed to ENT as instructed by Dr Vickey Huger but doesn't have an apt until 4/23 with them. He attempted to call them but no one has responded and just wanted to make MD aware.  Advised that in the note there was mention of referring to ENT to assess about imaging being complete. Informed I would make the MD  aware in case she had anything additionally to add but that for now it would be best to await on the apt with ENT.  Instructed the patient on signs and symptoms of stroke in case he develops new symptoms but these symptoms are related to what he has been having. Pt verbalized understanding.

## 2023-10-15 NOTE — Telephone Encounter (Signed)
 Thank you for making me aware-  Numbness in the face spreading/ extending to the forehead-  but no weakness is associated.  Could be related to diabetes or sinus disease/ doubt about vascular disease.   Please stay on Pristiq.   Awaiting MRI reports and images.     CD

## 2023-10-16 ENCOUNTER — Ambulatory Visit: Admitting: Family Medicine

## 2023-10-16 VITALS — BP 110/70 | HR 70 | Wt 205.8 lb

## 2023-10-16 DIAGNOSIS — E1169 Type 2 diabetes mellitus with other specified complication: Secondary | ICD-10-CM

## 2023-10-16 DIAGNOSIS — E785 Hyperlipidemia, unspecified: Secondary | ICD-10-CM

## 2023-10-16 DIAGNOSIS — F341 Dysthymic disorder: Secondary | ICD-10-CM

## 2023-10-16 DIAGNOSIS — G4733 Obstructive sleep apnea (adult) (pediatric): Secondary | ICD-10-CM | POA: Diagnosis not present

## 2023-10-16 DIAGNOSIS — Z1211 Encounter for screening for malignant neoplasm of colon: Secondary | ICD-10-CM

## 2023-10-16 DIAGNOSIS — F607 Dependent personality disorder: Secondary | ICD-10-CM

## 2023-10-16 DIAGNOSIS — Z794 Long term (current) use of insulin: Secondary | ICD-10-CM

## 2023-10-16 DIAGNOSIS — E1159 Type 2 diabetes mellitus with other circulatory complications: Secondary | ICD-10-CM

## 2023-10-16 DIAGNOSIS — I152 Hypertension secondary to endocrine disorders: Secondary | ICD-10-CM

## 2023-10-16 NOTE — Progress Notes (Signed)
   Subjective:    Patient ID: [name], male    DOB: 12/01/1955, 68 y.o.   MRN: [id number]  HPI He is here for medication check appointment.  He is seeing an endocrinologist for his diabetes and that endocrinologist is taking care of his diabetes meds, blood pressure and cholesterol medications.  He recently saw Dr. Albertina Hugger for evaluation of right facial numbness and she has referred him to ENT for further evaluation.  He does have underlying OSA but not significant enough to require CPAP.  His allergies seem to be under good control and he is now being followed by Dr. Oscar Blazing for this who is renewing the medications.  He is also followed by Dr. Marjo Sievert for his underlying depression.   Review of Systems     Objective:    Physical Exam Alert and in no distress otherwise not examined.  The medical record was reviewed.       Assessment & Plan:  Screening for colon cancer - Plan: Cologuard  Type 2 diabetes mellitus with other specified complication, with long-term current use of insulin (HCC)  Persistent depressive disorder  OSA (obstructive sleep apnea)  Passive-dependent personality disorder (HCC)  Hypertension associated with diabetes (HCC)  Hyperlipidemia associated with type 2 diabetes mellitus (HCC)  At this point his care is taken care of by various specialist for renewing his medications.  I explained that I will be here for him if he has other issues that need to be taken care of.

## 2023-10-17 ENCOUNTER — Ambulatory Visit (HOSPITAL_COMMUNITY): Payer: 59 | Admitting: Clinical

## 2023-10-17 ENCOUNTER — Encounter (HOSPITAL_COMMUNITY): Payer: Self-pay | Admitting: Clinical

## 2023-10-17 DIAGNOSIS — F325 Major depressive disorder, single episode, in full remission: Secondary | ICD-10-CM | POA: Diagnosis not present

## 2023-10-17 NOTE — Progress Notes (Signed)
 THERAPIST PROGRESS NOTE  Session Time: 2:08pm-3:04pm  Participation Level: Active  Behavioral Response: Casual Drowsy Anxious and Euthymic  Type of Therapy: Individual Therapy  Treatment Goals addressed:  New treatment goals established, current goals reviewed: LTG: Reduce frequency, intensity, and duration of depression symptoms so that daily functioning is improved  STG: Jurgen will participate in at least 80% of scheduled individual psychotherapy sessions  STG: Clara will practice behavioral activation skills 1 times per week for the next 26 weeks  LTG: Work to Arts development officer from models like CBT, Stages of Change, DBT, shame resilience theory, ACT, SFBT, MI, trauma-informed therapy and others to be able to manage mental health symptoms, AEB practicing out of session and reporting back.  LTG: Score less than 9 on the PHQ-9 and less than 5 on the GAD-7 as evidenced by intermittent administration of the questionnaires to determine progress in managing depression and anxiety.  STG: Identify and decrease cognitive distortions contributing negatively to mood and behavior by identifying 5-7 cognitive distortions that are present; learn how to come up with replacement thoughts that are more balanced, realistic, and helpful.  LTG: Improve self-esteem about weight, body appearance, and food behaviors AEB implementation of changes in food choices, weight management program involvement, and self-report of increasing confidence.  STG: Learn and practice communication techniques such as active listening, "I" statements, open-ended questions, fair fighting rules, initiating conversations;  learn about boundary types and how to implement/enforce them AEB self-report of use of same.  LTG: Learn breathing techniques and grounding techniques at an age-appropriate and ability-appropriate level and demonstrate mastery in session then report independent use of these skills out of  session.  ProgressTowards Goals: Progressing  Interventions: Assertiveness Training, Supportive, and Other: food issues, treatment planning  Summary: [name] is a 68 y.o. male who presents with increased depression. He presented oriented x5 and stated he was feeling "good today."  CSW evaluated patient's medication compliance, use of coping tools, and self-care, as applicable.  He is having difficulty with numbness in his face and head, particularly on the right side, states it is "driving me crazy."  He provided an update on various aspects of his life that are normally discussed in therapy, including that he had foot surgery and since then has gained 20 pounds.  His goal at this point is to lose 5 pounds, then he will consider the next goal.  CSW once again shared with him about Overeaters Anonymous, and he shared that "it's hard for me to do things alone and I'm always alone."  He revealed that he has added positive activities in his life including going back to reading and going to bed at a decent hour rather than giving in to his night-owl tendencies.  We explored the idea of positive affirmations that are not toxic in nature, as well as body-neutral affirmations.  He responded well to this and expressed that he might be able to do some of this. He continued, however, to focus on the social difficulties he has in public places, particularly as a black man and a gay man.  He rarely goes out in public and when he does it is at carefully-selected times with specific routines to follow.  CSW explained to him how one does not know the people in a support group such as OA until they first go, and then they know the people just by going, demonstrating this with the idea that he is very comfortable with CSW (which he reiterated is  very true) but at first we were strangers.  Finally, because of issues with his friendly neighbor, we talked about boundaries in some detail, using the handout on porous,  rigid, and healthy boundaries with a particular focus on how he can use this information to come up with a plan for this neighbor.    Suicidal/Homicidal: No without intent/plan  Therapist Response: Patient is progressing AEB engaging in scheduled therapy session.  Throughout the session, CSW gave patient the opportunity to explore thoughts and feelings associated with current life situations and past/present stressors.   CSW challenged patient gently and appropriately to consider different ways of looking at reported issues. CSW encouraged patient's expression of feelings and validated these using empathy, active listening, open body language, and unconditional positive regard.   CSW encouraged patient to schedule more therapy sessions for the future, as there are none on the schedule currently.    Plan/Recommendations: Return again at next scheduled appointment, consider the idea of truthful positive affirmations and neutral affirmations as discussed in session, consider attendance at OA, consider shifting boundaries as explained in handout provided, PHQ-9 and GAD-7 updates needed  Diagnosis:  Major depression in remission (HCC)  Collaboration of Care: Psychiatrist AEB - provider and therapist can read notes in Epic  Patient/Guardian was advised Release of Information must be obtained prior to any record release in order to collaborate their care with an outside provider. Patient/Guardian was advised if they have not already done so to contact the registration department to sign all necessary forms in order for us  to release information regarding their care.   Consent: Patient/Guardian gives verbal consent for treatment and assignment of benefits for services provided during this visit. Patient/Guardian expressed understanding and agreed to proceed.   Ancel Kass, LCSW 10/17/2023

## 2023-10-22 ENCOUNTER — Ambulatory Visit: Admitting: Podiatry

## 2023-10-22 ENCOUNTER — Ambulatory Visit (INDEPENDENT_AMBULATORY_CARE_PROVIDER_SITE_OTHER)

## 2023-10-22 DIAGNOSIS — M2042 Other hammer toe(s) (acquired), left foot: Secondary | ICD-10-CM | POA: Diagnosis not present

## 2023-10-22 DIAGNOSIS — D2372 Other benign neoplasm of skin of left lower limb, including hip: Secondary | ICD-10-CM | POA: Diagnosis not present

## 2023-10-22 NOTE — Progress Notes (Signed)
 He presents today for a chief complaint of a painful fifth toe left foot.  States has been bothering him for quite some time.  Objective: Vital stable oriented x 3 from previous surgery it appears that he has a porokeratotic lesion on the lateral aspect of the fifth toe just beside the scar.  Assessment: Benign skin lesion fifth toe left.  Plan: Office visit for new problem and then debridement of benign skin lesion fifth left.

## 2023-10-23 ENCOUNTER — Ambulatory Visit (INDEPENDENT_AMBULATORY_CARE_PROVIDER_SITE_OTHER): Payer: Self-pay | Admitting: Otolaryngology

## 2023-10-23 ENCOUNTER — Encounter (INDEPENDENT_AMBULATORY_CARE_PROVIDER_SITE_OTHER): Payer: Self-pay

## 2023-10-23 VITALS — BP 114/75 | HR 75 | Ht 71.0 in | Wt 200.0 lb

## 2023-10-23 DIAGNOSIS — J3089 Other allergic rhinitis: Secondary | ICD-10-CM

## 2023-10-23 DIAGNOSIS — R200 Anesthesia of skin: Secondary | ICD-10-CM

## 2023-10-23 NOTE — Patient Instructions (Signed)
 I have ordered an imaging study for you to complete prior to your next visit. Please call [facility] Scheduling at [phone] to schedule your imaging if you have not received a call within 24 hours. If you are unable to complete your imaging study prior to your next scheduled visit please call our office to let us  know.    I have ordered labs for you to complete at [facility]:  [address], [place], [place] [address] Please call our office if they do not have the order.

## 2023-10-23 NOTE — Progress Notes (Signed)
 Dear Dr. Albertina Hugger, Here is my assessment for our mutual patient, [name]. Thank you for allowing me the opportunity to care for your patient. Please do not hesitate to contact me should you have any other questions. Sincerely, Dr. Milon Aloe  Otolaryngology Clinic Note Referring provider: Dr. Albertina Hugger HPI:  [name] is a 68 y.o. male kindly referred by Dr. Albertina Hugger for evaluation of right sided facial numbness  Initial visit (10/2023): He reports that he started to have some right sided jaw line numbness, started 4-5 months ago. Then progressed to lower face and, completely involving the right face now. Gradually spreading, getting worse. Comes and goes, and different areas affected at different times, and it can last a few minutes up to a couple of hours but does report mild numbness all the time. No provoking factors. No weakness of face. Some discomfort. No floaters, vision changes. No fevers. No headaches. No extremity numbness or weakness. No jaw claudication. He does have chronic rhinitis symptoms but no discolored drainage from nose, no sinonasal pressure, or hyposmia. He does have allergies and rhinitis for which he followed Dr. Seabron Cypress - has PND, Congestion, rhinorrhea, on flonase , zyrtec, singulair , astelin   He reports that he had prior ear infections and had an ear tube placed with resolution. He has not noted any otologic symptoms including pain, drainage, tinnitus, hearing loss, vertigo. No history of skin cancers or melanomas Denies dysphagia, odynophagia, no unintentional weight loss, changes in voice, shortness of breath, neck masses  No recent dental procedures except for crown placement few months ago.  Does not know of any antecedent event. No medication changes. No diet changes - relatively balanced diet  He has not had an MRI or other recent imaging.   Has seen Dr. Albertina Hugger (Neuro) who rec ENT eval to rule out structural cause.  H&N Surgery:  no Personal or FHx of bleeding dz or anesthesia difficulty: no  GLP-1: no AP/AC: ASA 81  Tobacco: quit. Alcohol: no  PMHx: Lichen planus of mouth, Candidasis, HTN, DM, OSA, Serous otitis media (right), MDD  Independent Review of Additional Tests or Records:  Dr. Albertina Hugger notes 08/13/2023: facial numbness on right, close to V2/3, sometimes numb, tinging, and taste affected; recent dental work, and some dry mouth; did have COVID in Dec 2024. Some weight loss. Dx: Facial dysesthesias but no neuralgia or chewing related pain; no other lesions noted; no burning mouth; ref to ENT for sinus imaging.  Dr. Odean Bend (05/26/2019: itching and right ear drainage, M&T 7 months prior; Rx: abx ear drops, augmentin  BID, f/u as needed; right ear tube was in place at that time Dr. Seabron Cypress (10/22/2023 faxed notes) reviwed in media tab: AR 2/2 pollen, continue PO anthistamine, flonase , astelin , singulair  CBC and BMP 10/29/2022: WBC 8.4, Hgb 14.7, BUN/Cr 17/0.77 CT Sinus 10/2007 independently interpreted: b/l concha bullosa, left max and ethmoid opacification; mild right ethmoid opacification, otherwise paranasal sinuses generally clear PMH/Meds/All/SocHx/FamHx/ROS:   Past Medical History:  Diagnosis Date   Anxiety    Arthritis    back, shoulders, left great toe   Candidiasis, mouth 03/22/2016   Carpal tunnel syndrome of right wrist 10/2014   Dental crowns present    Depression    Dyslipidemia    Ear fullness 08/30/2016   GERD (gastroesophageal reflux disease)    Hypertension    under control with med., per pt.; has been on med. x 15-20 yr.   Insulin dependent diabetes mellitus    Nummular eczema    Obesity  Onychomycosis 08/30/2016   Recurrent infections 03/22/2016   Sleep apnea    uses CPAP nightly     Past Surgical History:  Procedure Laterality Date   CARDIAC CATHETERIZATION  12/30/2006   "mod. pulmonary HTN with preserved cardiac output and cardiac index"   CARPAL TUNNEL RELEASE Right 10/14/2014    Procedure: RIGHT CARPAL TUNNEL RELEASE;  Surgeon: Brunilda Capra, MD;  Location: [facility];  Service: Orthopedics;  Laterality: Right;   CARPAL TUNNEL RELEASE Left 05/19/2015   Procedure: LEFT CARPAL TUNNEL RELEASE;  Surgeon: Brunilda Capra, MD;  Location: [facility];  Service: Orthopedics;  Laterality: Left;   CYST EXCISION     scalp and back   ESOPHAGOGASTRODUODENOSCOPY  03/25/2007   FOOT SURGERY Left    revision traumatic amputation of foot   ROUX-EN-Y PROCEDURE  07/28/2007   with lysis of adhesions    Family History  Problem Relation Age of Onset   Heart disease Mother    Cancer Mother 24       Multiple myeloma   Diabetes Father    Diabetes Brother    Heart disease Brother 43       cardiomyopathy   Heart disease Brother 19       MI   Arthritis Paternal Grandmother    Diabetes Paternal Grandmother    Heart disease Paternal Grandmother    Stroke Paternal Grandmother      Social Connections: Not on file      Current Outpatient Medications:    acetaminophen  (TYLENOL ) 500 MG tablet, Take 500 mg by mouth every 6 (six) hours as needed for mild pain (pain score 1-3)., Disp: , Rfl:    ASPIRIN  LOW DOSE 81 MG EC tablet, TAKE 1 TABLET BY MOUTH AT  BEDTIME . SWALLOW WHOLE., Disp: 30 tablet, Rfl: 0   Azelastine  HCl 137 MCG/SPRAY SOLN, Place 1 spray into both nostrils daily., Disp: 30 mL, Rfl: 3   B Complex-C (B-COMPLEX WITH VITAMIN C) tablet, Take 1 tablet by mouth 2 (two) times daily., Disp: , Rfl:    buPROPion  (WELLBUTRIN  SR) 200 MG 12 hr tablet, Take 1 tablet (200 mg total) by mouth 2 (two) times daily., Disp: 180 tablet, Rfl: 4   calcium -vitamin D 250-100 MG-UNIT per tablet, Take 1 tablet by mouth 2 (two) times daily., Disp: , Rfl:    celecoxib  (CELEBREX ) 100 MG capsule, TAKE 1 CAPSULE BY MOUTH TWICE  DAILY, Disp: 180 capsule, Rfl: 3   cetirizine (ZYRTEC) 10 MG tablet, Take 10 mg by mouth daily., Disp: , Rfl:    Continuous Blood Gluc Sensor (FREESTYLE  LIBRE 2 SENSOR) MISC, Now on Freestyle Libre 3, Disp: , Rfl:    Continuous Glucose Receiver (FREESTYLE LIBRE 2 READER) DEVI, Scan as needed for continuous glucose monitoring., Disp: , Rfl:    cyanocobalamin (VITAMIN B12) 1000 MCG tablet, Take 1,000 mcg by mouth daily., Disp: , Rfl:    DENTA 5000 PLUS 1.1 % CREA dental cream, Take by mouth., Disp: , Rfl:    desvenlafaxine  (PRISTIQ ) 50 MG 24 hr tablet, 2 qam, Disp: 180 tablet, Rfl: 0   empagliflozin (JARDIANCE) 25 MG TABS tablet, Take 25 mg by mouth daily., Disp: , Rfl:    Ferrous Sulfate  (IRON  SLOW RELEASE) 142 (45 Fe) MG TBCR, Take 1 tablet by mouth daily., Disp: 30 tablet, Rfl: 5   fluticasone  (FLONASE ) 50 MCG/ACT nasal spray, USE 1 SPRAY NASALLY ONCE A  DAY FOR ALLERGIES, Disp: 32 g, Rfl: 0   glucose blood test  strip, USE TO CHECK SUGAR 4 TIMES  DAILY., Disp: , Rfl:    insulin glargine (LANTUS SOLOSTAR) 100 UNIT/ML Solostar Pen, Inject 8 Units into the skin daily., Disp: , Rfl:    Insulin Pen Needle 32G X 4 MM MISC, Use to inject insulin once a day or as directed, Disp: , Rfl:    Insulin Syringe-Needle U-100 (INSULIN SYRINGE 1CC/31GX5/16") 31G X 5/16" 1 ML MISC, , Disp: , Rfl:    levocetirizine (XYZAL ) 5 MG tablet, TAKE 1 TABLET BY MOUTH AT  BEDTIME, Disp: 90 tablet, Rfl: 0   lisinopril  (ZESTRIL ) 10 MG tablet, Take 1 tablet (10 mg total) by mouth daily., Disp: 90 tablet, Rfl: 3   metFORMIN (GLUCOPHAGE) 1000 MG tablet, Take 1,000 mg by mouth 2 (two) times daily with a meal., Disp: , Rfl:    montelukast  (SINGULAIR ) 10 MG tablet, Take 1 tablet (10 mg total) by mouth at bedtime., Disp: 90 tablet, Rfl: 3   Multiple Vitamin (MULTI-VITAMINS) TABS, Take 1 tablet twice a day, Disp: , Rfl:    ONETOUCH DELICA LANCETS 33G MISC, , Disp: , Rfl:    ONETOUCH VERIO test strip, , Disp: , Rfl:    pilocarpine (SALAGEN) 5 MG tablet, Take 5 mg by mouth 3 (three) times daily., Disp: , Rfl:    rosuvastatin  (CRESTOR ) 20 MG tablet, TAKE 1 TABLET BY MOUTH DAILY, Disp: 90  tablet, Rfl: 2   traZODone  (DESYREL ) 100 MG tablet, Take 100 mg by mouth at bedtime., Disp: , Rfl:    Physical Exam:   BP 114/75 (BP Location: Left Arm, Patient Position: Sitting, Cuff Size: Large)   Pulse 75   Ht 5\' 11"  (1.803 m)   Wt 200 lb (90.7 kg)   SpO2 96%   BMI 27.89 kg/m   Salient findings:  CN II-XII intact - EXCEPT - Right V1/2/3 distribution numbness, some reported right hemitongue and FOM numbness as well; right maxillary and mandibular dentition subjective numbness; no auricular numbness  Bilateral EAC clear and TM intact with well pneumatized middle ear spaces; prior right PE tube site well healed Anterior rhinoscopy: Septum dev right; bilateral inferior turbinates without significant hypertrophy; Nasal endoscopy was indicated to better evaluate the nose and paranasal sinuses, given the patient's history and exam findings, and is detailed below. No lesions of oral cavity/oropharynx; dentition fair; no candida noted, minimal small (<1x1 cm) area of lichen planus right buccal mucosa No obviously palpable neck masses/lymphadenopathy/thyromegaly No respiratory distress or stridor  Seprately Identifiable Procedures:  Prior to initiating any procedures, risks/benefits/alternatives were explained to the patient and verbal consent obtained. PROCEDURE: Bilateral Diagnostic Rigid Nasal Endoscopy Pre-procedure diagnosis: Right facial numbness, concern for chronic sinusitis as cause (fungal?) Post-procedure diagnosis: same Indication: See pre-procedure diagnosis and physical exam above Complications: None apparent EBL: 0 mL Anesthesia: Lidocaine  4% and topical decongestant was topically sprayed in each nasal cavity  Description of Procedure:  Patient was identified. A rigid 30 degree endoscope was utilized to evaluate the sinonasal cavities, mucosa, sinus ostia and turbinates and septum.  Overall, signs of mucosal inflammation are not noted.  No mucopurulence, polyps, or masses  noted.   Right Middle meatus: clear Right SE Recess: clear Left MM: clear Left SE Recess: clear  Photodocumentation was obtained.  CPT CODE -- 16109 - Mod 25   Impression & Plans:  Anne Sebring is a 68 y.o. male with h/o AR now with:  1. Right facial numbness   2. Non-seasonal allergic rhinitis, unspecified trigger   3.  Concern for chronic sinusitis as cause for numbness  Exam is otherwise quite reassuring without any purulence or masses noted on endoscopy. Given his V1, 2, and 3 numbness, would not expect this to be a sinonasal cause based on the pattern. Perhaps a central cause? DDX is wide here and can include metabolic causes as well as well as neurologic or systemic causes. As such, we will obtain a MRI to rule out any structural facial causes.  Will also get labs (CBC, MP, TSH, Zinc/B12/Folate/CRP).  Encouraged him to follow up with Neuro as well Follow up by phone in 6 weeks, sooner if necessary - at this point, based on his symptomatology, do not suspect primary ENT cause but will investigate.  See below regarding exact medications prescribed this encounter including dosages and route: No orders of the defined types were placed in this encounter.     Thank you for allowing me the opportunity to care for your patient. Please do not hesitate to contact me should you have any other questions.  Sincerely, Milon Aloe, MD Otolaryngologist (ENT), [facility] Phone: [phone] Fax: [phone]  10/23/2023, 12:59 PM    I have personally spent 63 minutes involved in face-to-face and non-face-to-face activities for this patient on the day of the visit.  Professional time spent excludes any procedures performed but includes the following activities, in addition to those noted in the documentation: preparing to see the patient (review of outside documentation and results - extensive including multiple outside notes), performing a medically appropriate  examination, counseling,, documenting in the electronic health record, independently interpreting results (CT).

## 2023-10-25 ENCOUNTER — Telehealth (INDEPENDENT_AMBULATORY_CARE_PROVIDER_SITE_OTHER): Payer: Self-pay

## 2023-10-25 NOTE — Telephone Encounter (Signed)
 LVM informing patient of Dr. Basilio Both recommendation.

## 2023-10-28 LAB — COMPLETE METABOLIC PANEL WITHOUT GFR
AG Ratio: 2.1 (calc) (ref 1.0–2.5)
ALT: 18 U/L (ref 9–46)
AST: 17 U/L (ref 10–35)
Albumin: 4.2 g/dL (ref 3.6–5.1)
Alkaline phosphatase (APISO): 70 U/L (ref 35–144)
BUN: 16 mg/dL (ref 7–25)
CO2: 26 mmol/L (ref 20–32)
Calcium: 9.5 mg/dL (ref 8.6–10.3)
Chloride: 106 mmol/L (ref 98–110)
Creat: 0.83 mg/dL (ref 0.70–1.35)
Globulin: 2 g/dL (ref 1.9–3.7)
Glucose, Bld: 101 mg/dL (ref 65–139)
Potassium: 4.6 mmol/L (ref 3.5–5.3)
Sodium: 140 mmol/L (ref 135–146)
Total Bilirubin: 0.4 mg/dL (ref 0.2–1.2)
Total Protein: 6.2 g/dL (ref 6.1–8.1)

## 2023-10-28 LAB — TSH: TSH: 0.44 m[IU]/L (ref 0.40–4.50)

## 2023-10-28 LAB — CBC WITH DIFFERENTIAL/PLATELET
Absolute Lymphocytes: 1681 {cells}/uL (ref 850–3900)
Absolute Monocytes: 748 {cells}/uL (ref 200–950)
Basophils Absolute: 26 {cells}/uL (ref 0–200)
Basophils Relative: 0.3 %
Eosinophils Absolute: 62 {cells}/uL (ref 15–500)
Eosinophils Relative: 0.7 %
HCT: 44.1 % (ref 38.5–50.0)
Hemoglobin: 15.1 g/dL (ref 13.2–17.1)
MCH: 32.2 pg (ref 27.0–33.0)
MCHC: 34.2 g/dL (ref 32.0–36.0)
MCV: 94 fL (ref 80.0–100.0)
MPV: 9.3 fL (ref 7.5–12.5)
Monocytes Relative: 8.5 %
Neutro Abs: 6283 {cells}/uL (ref 1500–7800)
Neutrophils Relative %: 71.4 %
Platelets: 230 10*3/uL (ref 140–400)
RBC: 4.69 10*6/uL (ref 4.20–5.80)
RDW: 12.7 % (ref 11.0–15.0)
Total Lymphocyte: 19.1 %
WBC: 8.8 10*3/uL (ref 3.8–10.8)

## 2023-10-28 LAB — VITAMIN B12: Vitamin B-12: 854 pg/mL (ref 200–1100)

## 2023-10-28 LAB — ZINC: Zinc: 67 ug/dL (ref 60–130)

## 2023-10-28 LAB — FOLATE: Folate: >24 ng/mL

## 2023-10-28 LAB — C-REACTIVE PROTEIN: CRP: <3 mg/L (ref <8.0)

## 2023-10-30 ENCOUNTER — Ambulatory Visit: Payer: 59 | Admitting: Neurology

## 2023-11-01 ENCOUNTER — Ambulatory Visit (HOSPITAL_COMMUNITY)
Admission: RE | Admit: 2023-11-01 | Discharge: 2023-11-01 | Disposition: A | Discharge status: Home / Self Care | Source: Ambulatory Visit | Attending: Otolaryngology | Admitting: Otolaryngology

## 2023-11-01 DIAGNOSIS — R200 Anesthesia of skin: Secondary | ICD-10-CM | POA: Diagnosis present

## 2023-11-01 MED ORDER — GADOBUTROL 1 MMOL/ML IV SOLN
9.0000 mL | Freq: Once | INTRAVENOUS | Status: AC | PRN
  Administered 2023-11-01: 9 mL via INTRAVENOUS

## 2023-11-19 LAB — COLOGUARD

## 2023-11-20 ENCOUNTER — Telehealth (INDEPENDENT_AMBULATORY_CARE_PROVIDER_SITE_OTHER): Payer: Self-pay

## 2023-11-20 NOTE — Telephone Encounter (Signed)
 Patient called and left a message stating he wanted someone to call with his lab and MRI results.  He stated the numbness is getting worse and wanted to know the next course of action.  Please contact him in his cell  [phone]

## 2023-11-21 ENCOUNTER — Telehealth (INDEPENDENT_AMBULATORY_CARE_PROVIDER_SITE_OTHER): Payer: Self-pay

## 2023-11-21 NOTE — Telephone Encounter (Signed)
 Left a message for the patient that his MR has not been read and reminded him of his follow up in June with Dr Lydia Sams.  I called radiology to have the scan read and let him know we would call with the results .

## 2023-11-26 NOTE — Telephone Encounter (Signed)
 Patient scheduled 05/30 at 1:15p.

## 2023-11-29 ENCOUNTER — Encounter (INDEPENDENT_AMBULATORY_CARE_PROVIDER_SITE_OTHER): Payer: Self-pay | Admitting: Otolaryngology

## 2023-11-29 ENCOUNTER — Ambulatory Visit (INDEPENDENT_AMBULATORY_CARE_PROVIDER_SITE_OTHER): Admitting: Otolaryngology

## 2023-11-29 VITALS — BP 114/69 | HR 72 | Ht 71.0 in | Wt 200.0 lb

## 2023-11-29 DIAGNOSIS — R200 Anesthesia of skin: Secondary | ICD-10-CM

## 2023-11-29 DIAGNOSIS — R229 Localized swelling, mass and lump, unspecified: Secondary | ICD-10-CM | POA: Diagnosis not present

## 2023-11-29 DIAGNOSIS — D329 Benign neoplasm of meninges, unspecified: Secondary | ICD-10-CM

## 2023-11-29 MED ORDER — GABAPENTIN 100 MG PO CAPS: 100.0000 mg | ORAL_CAPSULE | Freq: Every day | ORAL | 0 refills | Status: DC

## 2023-11-29 NOTE — Patient Instructions (Signed)
 Take 100mg  gabapentin at bedtime; if that does not work in 2 weeks, can go up to 200mg , then stop.

## 2023-12-04 LAB — COLOGUARD: COLOGUARD: NEGATIVE

## 2023-12-05 ENCOUNTER — Ambulatory Visit (INDEPENDENT_AMBULATORY_CARE_PROVIDER_SITE_OTHER): Admitting: Otolaryngology

## 2023-12-05 ENCOUNTER — Ambulatory Visit: Payer: Self-pay | Admitting: Family Medicine

## 2023-12-05 ENCOUNTER — Telehealth (INDEPENDENT_AMBULATORY_CARE_PROVIDER_SITE_OTHER): Payer: Self-pay | Admitting: Otolaryngology

## 2023-12-05 NOTE — Telephone Encounter (Signed)
 Patient called in asking for a referral to a different Neuro Surgery.  Cannot be seen until the end of July and wants to go somewhere that can see him sooner.  Please advise.

## 2023-12-06 ENCOUNTER — Telehealth: Payer: Self-pay

## 2023-12-06 NOTE — Telephone Encounter (Signed)
 I called [name] to offer him an appointment with Dr. Felipe Horton. I apologized for the delay in scheduling related to his referral. I explained that our [facility] office does not open until July 1st but since his provider wanted him seen urgently we are able to see him in [place] for an initial evaluation.   I discussed this with Dr. Felipe Horton and he agreed.

## 2023-12-08 NOTE — Progress Notes (Signed)
 Dear Dr. Robina Chol, Here is my assessment for our mutual patient, [name]. Thank you for allowing me the opportunity to care for your patient. Please do not hesitate to contact me should you have any other questions. Sincerely, Dr. Milon Aloe  Otolaryngology Clinic Note Referring provider: Dr. Robina Chol HPI:  [name] is a 68 y.o. male kindly referred by Dr. Robina Chol for evaluation of right sided facial numbness  Initial visit (10/2023): He reports that he started to have some right sided jaw line numbness, started 4-5 months ago. Then progressed to lower face and, completely involving the right face now. Gradually spreading, getting worse. Comes and goes, and different areas affected at different times, and it can last a few minutes up to a couple of hours but does report mild numbness all the time. No provoking factors. No weakness of face. Some discomfort. No floaters, vision changes. No fevers. No headaches. No extremity numbness or weakness. No jaw claudication. He does have chronic rhinitis symptoms but no discolored drainage from nose, no sinonasal pressure, or hyposmia. He does have allergies and rhinitis for which he followed Dr. Seabron Cypress - has PND, Congestion, rhinorrhea, on flonase , zyrtec, singulair , astelin   He reports that he had prior ear infections and had an ear tube placed with resolution. He has not noted any otologic symptoms including pain, drainage, tinnitus, hearing loss, vertigo. No history of skin cancers or melanomas Denies dysphagia, odynophagia, no unintentional weight loss, changes in voice, shortness of breath, neck masses  No recent dental procedures except for crown placement few months ago.  Does not know of any antecedent event. No medication changes. No diet changes - relatively balanced diet  He has not had an MRI or other recent imaging.   Has seen Dr. Albertina Hugger (Neuro) who rec ENT eval to rule out structural  cause.  --------------------------------------------------------- 11/29/2023 Seen in follow-up.  [name] did have the MRI and continues to report right sided facial numbness.  We had a long discussion about the MRI and weakness.  No headaches or fevers   H&N Surgery: no Personal or FHx of bleeding dz or anesthesia difficulty: no  GLP-1: no AP/AC: ASA 81  Tobacco: quit. Alcohol: no  PMHx: Lichen planus of mouth, Candidasis, HTN, DM, OSA, MDD  Independent Review of Additional Tests or Records:  Dr. Albertina Hugger notes 08/13/2023: facial numbness on right, close to V2/3, sometimes numb, tinging, and taste affected; recent dental work, and some dry mouth; did have COVID in Dec 2024. Some weight loss. Dx: Facial dysesthesias but no neuralgia or chewing related pain; no other lesions noted; no burning mouth; ref to ENT for sinus imaging.  Dr. Odean Bend (05/26/2019: itching and right ear drainage, M&T 7 months prior; Rx: abx ear drops, augmentin  BID, f/u as needed; right ear tube was in place at that time Dr. Seabron Cypress (10/22/2023 faxed notes) reviwed in media tab: AR 2/2 pollen, continue PO anthistamine, flonase , astelin , singulair  CBC and BMP 10/29/2022: WBC 8.4, Hgb 14.7, BUN/Cr 17/0.77 CT Sinus 10/2007 independently interpreted: b/l concha bullosa, left max and ethmoid opacification; mild right ethmoid opacification, otherwise paranasal sinuses generally clear MRI Face w/wo 11/01/2023 independently reviewed and interpreted: noted right Meckel's cae enhancing mass ~1cm with extension into prepontine cistern. Abuts carotid. Slight displacement of Trigeminal nerve. Query dural tail? No significant paranasal sinus disease. Small right mastoid effusion. PMH/Meds/All/SocHx/FamHx/ROS:   Past Medical History:  Diagnosis Date   Anxiety    Arthritis    back, shoulders, left great toe  Candidiasis, mouth 03/22/2016   Carpal tunnel syndrome of right wrist 10/2014   Dental crowns present    Depression     Dyslipidemia    Ear fullness 08/30/2016   GERD (gastroesophageal reflux disease)    Hypertension    under control with med., per pt.; has been on med. x 15-20 yr.   Insulin dependent diabetes mellitus    Nummular eczema    Obesity    Onychomycosis 08/30/2016   Recurrent infections 03/22/2016   Sleep apnea    uses CPAP nightly     Past Surgical History:  Procedure Laterality Date   CARDIAC CATHETERIZATION  12/30/2006   "mod. pulmonary HTN with preserved cardiac output and cardiac index"   CARPAL TUNNEL RELEASE Right 10/14/2014   Procedure: RIGHT CARPAL TUNNEL RELEASE;  Surgeon: Brunilda Capra, MD;  Location: [facility];  Service: Orthopedics;  Laterality: Right;   CARPAL TUNNEL RELEASE Left 05/19/2015   Procedure: LEFT CARPAL TUNNEL RELEASE;  Surgeon: Brunilda Capra, MD;  Location: [facility];  Service: Orthopedics;  Laterality: Left;   CYST EXCISION     scalp and back   ESOPHAGOGASTRODUODENOSCOPY  03/25/2007   FOOT SURGERY Left    revision traumatic amputation of foot   ROUX-EN-Y PROCEDURE  07/28/2007   with lysis of adhesions    Family History  Problem Relation Age of Onset   Heart disease Mother    Cancer Mother 38       Multiple myeloma   Diabetes Father    Diabetes Brother    Heart disease Brother 72       cardiomyopathy   Heart disease Brother 78       MI   Arthritis Paternal Grandmother    Diabetes Paternal Grandmother    Heart disease Paternal Grandmother    Stroke Paternal Grandmother      Social Connections: Not on file      Current Outpatient Medications:    acetaminophen  (TYLENOL ) 500 MG tablet, Take 500 mg by mouth every 6 (six) hours as needed for mild pain (pain score 1-3)., Disp: , Rfl:    ASPIRIN  LOW DOSE 81 MG EC tablet, TAKE 1 TABLET BY MOUTH AT  BEDTIME . SWALLOW WHOLE., Disp: 30 tablet, Rfl: 0   Azelastine  HCl 137 MCG/SPRAY SOLN, Place 1 spray into both nostrils daily., Disp: 30 mL, Rfl: 3   B Complex-C (B-COMPLEX WITH  VITAMIN C) tablet, Take 1 tablet by mouth 2 (two) times daily., Disp: , Rfl:    buPROPion  (WELLBUTRIN  SR) 200 MG 12 hr tablet, Take 1 tablet (200 mg total) by mouth 2 (two) times daily., Disp: 180 tablet, Rfl: 4   calcium -vitamin D 250-100 MG-UNIT per tablet, Take 1 tablet by mouth 2 (two) times daily., Disp: , Rfl:    celecoxib  (CELEBREX ) 100 MG capsule, TAKE 1 CAPSULE BY MOUTH TWICE  DAILY, Disp: 180 capsule, Rfl: 3   cetirizine (ZYRTEC) 10 MG tablet, Take 10 mg by mouth daily., Disp: , Rfl:    Continuous Blood Gluc Sensor (FREESTYLE LIBRE 2 SENSOR) MISC, Now on Freestyle Libre 3, Disp: , Rfl:    Continuous Glucose Receiver (FREESTYLE LIBRE 2 READER) DEVI, Scan as needed for continuous glucose monitoring., Disp: , Rfl:    cyanocobalamin (VITAMIN B12) 1000 MCG tablet, Take 1,000 mcg by mouth daily., Disp: , Rfl:    DENTA 5000 PLUS 1.1 % CREA dental cream, Take by mouth., Disp: , Rfl:    desvenlafaxine  (PRISTIQ ) 50 MG 24 hr tablet, 2  qam, Disp: 180 tablet, Rfl: 0   empagliflozin (JARDIANCE) 25 MG TABS tablet, Take 25 mg by mouth daily., Disp: , Rfl:    Ferrous Sulfate  (IRON  SLOW RELEASE) 142 (45 Fe) MG TBCR, Take 1 tablet by mouth daily., Disp: 30 tablet, Rfl: 5   fluticasone  (FLONASE ) 50 MCG/ACT nasal spray, USE 1 SPRAY NASALLY ONCE A  DAY FOR ALLERGIES, Disp: 32 g, Rfl: 0   gabapentin  (NEURONTIN ) 100 MG capsule, Take 1 capsule (100 mg total) by mouth at bedtime., Disp: 30 capsule, Rfl: 0   glucose blood test strip, USE TO CHECK SUGAR 4 TIMES  DAILY., Disp: , Rfl:    hydrocortisone 2.5 % cream, Apply topically., Disp: , Rfl:    insulin glargine (LANTUS SOLOSTAR) 100 UNIT/ML Solostar Pen, Inject 8 Units into the skin daily., Disp: , Rfl:    Insulin Pen Needle 32G X 4 MM MISC, Use to inject insulin once a day or as directed, Disp: , Rfl:    Insulin Syringe-Needle U-100 (INSULIN SYRINGE 1CC/31GX5/16") 31G X 5/16" 1 ML MISC, , Disp: , Rfl:    levocetirizine (XYZAL ) 5 MG tablet, TAKE 1 TABLET BY MOUTH  AT  BEDTIME, Disp: 90 tablet, Rfl: 0   lisinopril  (ZESTRIL ) 10 MG tablet, Take 1 tablet (10 mg total) by mouth daily., Disp: 90 tablet, Rfl: 3   metFORMIN (GLUCOPHAGE) 1000 MG tablet, Take 1,000 mg by mouth 2 (two) times daily with a meal., Disp: , Rfl:    montelukast  (SINGULAIR ) 10 MG tablet, Take 1 tablet (10 mg total) by mouth at bedtime., Disp: 90 tablet, Rfl: 3   Multiple Vitamin (MULTI-VITAMINS) TABS, Take 1 tablet twice a day, Disp: , Rfl:    nystatin -triamcinolone  ointment (MYCOLOG), Apply topically., Disp: , Rfl:    ONETOUCH DELICA LANCETS 33G MISC, , Disp: , Rfl:    ONETOUCH VERIO test strip, , Disp: , Rfl:    pilocarpine (SALAGEN) 5 MG tablet, Take 5 mg by mouth 3 (three) times daily., Disp: , Rfl:    rosuvastatin  (CRESTOR ) 20 MG tablet, TAKE 1 TABLET BY MOUTH DAILY, Disp: 90 tablet, Rfl: 2   traZODone  (DESYREL ) 100 MG tablet, Take 100 mg by mouth at bedtime., Disp: , Rfl:    Physical Exam:   BP 114/69 (BP Location: Left Arm, Patient Position: Sitting, Cuff Size: Large)   Pulse 72   Ht 5\' 11"  (1.803 m)   Wt 200 lb (90.7 kg)   SpO2 98%   BMI 27.89 kg/m   Salient findings:  CN II-XII intact - EXCEPT - Right V1/2/3 distribution numbness, some reported right hemitongue and FOM numbness as well; right maxillary and mandibular dentition subjective numbness; no auricular numbness - stable  Bilateral EAC clear and TM intact with well pneumatized middle ear spaces; prior right PE tube site well healed Anterior rhinoscopy: Septum dev right; bilateral inferior turbinates without significant hypertrophy; no purulence No lesions of oral cavity/oropharynx; dentition fair; no candida noted, minimal small (<1x1 cm) area of lichen planus right buccal mucosa No obviously palpable neck masses/lymphadenopathy/thyromegaly No respiratory distress or stridor  Seprately Identifiable Procedures:  Prior to initiating any procedures, risks/benefits/alternatives were explained to the patient and verbal  consent obtained. PROCEDURE: Bilateral Diagnostic Rigid Nasal Endoscopy Prior, not today: Description of Procedure:  Patient was identified. A rigid 30 degree endoscope was utilized to evaluate the sinonasal cavities, mucosa, sinus ostia and turbinates and septum.  Overall, signs of mucosal inflammation are not noted.  No mucopurulence, polyps, or masses noted.   Right Middle  meatus: clear Right SE Recess: clear Left MM: clear Left SE Recess: clear  Photodocumentation was obtained.  CPT CODE -- 16109 - Mod 25   Impression & Plans:  Hurbert Duran is a 68 y.o. male with h/o AR now with:  1. Meningioma (HCC)   2. Right facial numbness    Exam is otherwise quite reassuring without any purulence or masses noted on endoscopy. MRI with right Meckel's cave mass, likely meningioma(?) v/s schwannoma - which based on anatomic location would explain patient's facial numbness symptoms. Labs otherwise unremarkable.  Mr. Pop is of course quite distressed by the findings on MRI and had several questions which I answered.  We had a long discussion regarding next steps and what the MRI findings mean.  Will refer to neurosurgery for further management.   He was invited to call us  if there is anything further we can do for him to expedite his care or if he does not hear back from NSGY  Thank you for allowing me the opportunity to care for your patient. Please do not hesitate to contact me should you have any other questions.  Sincerely, Milon Aloe, MD Otolaryngologist (ENT), [facility] Phone: [phone] Fax: [phone]  12/08/2023, 11:55 AM    I have personally spent 42 minutes involved in face-to-face and non-face-to-face activities for this patient on the day of the visit.  Professional time spent excludes any procedures performed but includes the following activities, in addition to those noted in the documentation: preparing to see the patient (review of outside  documentation and results), performing a medically appropriate examination, extensive counseling regarding MRI findings, ordering medications (gabapentin  trial), documenting in the electronic health record, independently interpreting results (MRI).

## 2023-12-09 ENCOUNTER — Telehealth: Payer: Self-pay | Admitting: Family Medicine

## 2023-12-09 NOTE — Telephone Encounter (Unsigned)
 Copied from CRM [id number]. Topic: General - Other >> Dec 09, 2023 10:58 AM [employer] wrote: Reason for CRM: Patient wanted to let Dr. Robina Chol know he has a brain tumor and is currently receiving treatment for it.  Callback #: [id number]

## 2023-12-12 ENCOUNTER — Ambulatory Visit (INDEPENDENT_AMBULATORY_CARE_PROVIDER_SITE_OTHER): Admitting: Otolaryngology

## 2023-12-12 NOTE — Progress Notes (Unsigned)
 Referring Physician:  Watson Hacking, MD [address] [place],  [place] [address]  Primary Physician:  Watson Hacking, MD  History of Present Illness: 12/16/2023 [name] is here today with a chief complaint of face numbness and tingling.  He was being worked up for trigeminal neuralgia.  Underwent an MRI of his brain which demonstrated a Meckel's cave region meningioma.  He has no family history of meningiomas, he has no family history of nerve sheath tumors, has no family history of any intracranial issues.  He does have family history of bone cancer.  He feels like over the past 6 to 8 months he has had progressive changes in his facial sensation.  It started at the bottom of his jaw and moved up his face.  He sometimes feels that his right eye is weak and he gets some intermittent tingling.  He has not noticed any significant palliative or provocative mechanisms.  He was started on a very low-dose of gabapentin  including 100 mg at night.  He has not noticed a significant change after starting his gabapentin  therapy.  The symptoms are causing a significant impact on the patient's life.   I have utilized the care everywhere function in epic to review the outside records available from external health systems.  Review of Systems:  A 10 point review of systems is negative, except for the pertinent positives and negatives detailed in the HPI.  Past Medical History: Past Medical History:  Diagnosis Date   Anxiety    Arthritis    back, shoulders, left great toe   Candidiasis, mouth 03/22/2016   Carpal tunnel syndrome of right wrist 10/2014   Dental crowns present    Depression    Dyslipidemia    Ear fullness 08/30/2016   GERD (gastroesophageal reflux disease)    Hypertension    under control with med., per pt.; has been on med. x 15-20 yr.   Insulin dependent diabetes mellitus    Nummular eczema    Obesity    Onychomycosis 08/30/2016   Recurrent infections  03/22/2016   Sleep apnea    uses CPAP nightly    Past Surgical History: Past Surgical History:  Procedure Laterality Date   CARDIAC CATHETERIZATION  12/30/2006   mod. pulmonary HTN with preserved cardiac output and cardiac index   CARPAL TUNNEL RELEASE Right 10/14/2014   Procedure: RIGHT CARPAL TUNNEL RELEASE;  Surgeon: Brunilda Capra, MD;  Location: [facility];  Service: Orthopedics;  Laterality: Right;   CARPAL TUNNEL RELEASE Left 05/19/2015   Procedure: LEFT CARPAL TUNNEL RELEASE;  Surgeon: Brunilda Capra, MD;  Location: [facility];  Service: Orthopedics;  Laterality: Left;   CYST EXCISION     scalp and back   ESOPHAGOGASTRODUODENOSCOPY  03/25/2007   FOOT SURGERY Left    revision traumatic amputation of foot   ROUX-EN-Y PROCEDURE  07/28/2007   with lysis of adhesions    Allergies: Allergies as of 12/16/2023 - Review Complete 12/16/2023  Allergen Reaction Noted   Hydrogen peroxide  03/09/2021   Latex Itching 05/19/2015    Medications:  Current Outpatient Medications:    acetaminophen  (TYLENOL ) 500 MG tablet, Take 500 mg by mouth every 6 (six) hours as needed for mild pain (pain score 1-3)., Disp: , Rfl:    ASPIRIN  LOW DOSE 81 MG EC tablet, TAKE 1 TABLET BY MOUTH AT  BEDTIME . SWALLOW WHOLE., Disp: 30 tablet, Rfl: 0   Azelastine  HCl 137 MCG/SPRAY SOLN, Place 1  spray into both nostrils daily., Disp: 30 mL, Rfl: 3   B Complex-C (B-COMPLEX WITH VITAMIN C) tablet, Take 1 tablet by mouth 2 (two) times daily., Disp: , Rfl:    buPROPion  (WELLBUTRIN  SR) 200 MG 12 hr tablet, Take 1 tablet (200 mg total) by mouth 2 (two) times daily., Disp: 180 tablet, Rfl: 4   calcium -vitamin D 250-100 MG-UNIT per tablet, Take 1 tablet by mouth 2 (two) times daily., Disp: , Rfl:    celecoxib  (CELEBREX ) 100 MG capsule, TAKE 1 CAPSULE BY MOUTH TWICE  DAILY, Disp: 180 capsule, Rfl: 3   cetirizine (ZYRTEC) 10 MG tablet, Take 10 mg by mouth daily., Disp: , Rfl:    Continuous Blood  Gluc Sensor (FREESTYLE LIBRE 2 SENSOR) MISC, Now on Freestyle Libre 3, Disp: , Rfl:    Continuous Glucose Receiver (FREESTYLE LIBRE 2 READER) DEVI, Scan as needed for continuous glucose monitoring., Disp: , Rfl:    DENTA 5000 PLUS 1.1 % CREA dental cream, Take by mouth., Disp: , Rfl:    desvenlafaxine  (PRISTIQ ) 50 MG 24 hr tablet, 2 qam, Disp: 180 tablet, Rfl: 0   empagliflozin (JARDIANCE) 25 MG TABS tablet, Take 25 mg by mouth daily., Disp: , Rfl:    Ferrous Sulfate  (IRON  SLOW RELEASE) 142 (45 Fe) MG TBCR, Take 1 tablet by mouth daily., Disp: 30 tablet, Rfl: 5   fluticasone  (FLONASE ) 50 MCG/ACT nasal spray, USE 1 SPRAY NASALLY ONCE A  DAY FOR ALLERGIES, Disp: 32 g, Rfl: 0   gabapentin  (NEURONTIN ) 300 MG capsule, Take 1 capsule (300 mg total) by mouth at bedtime for 14 days, THEN 1 capsule (300 mg total) 2 (two) times daily for 14 days, THEN 1 capsule (300 mg total) 3 (three) times daily for 14 days., Disp: 84 capsule, Rfl: 0   glucose blood test strip, USE TO CHECK SUGAR 4 TIMES  DAILY., Disp: , Rfl:    insulin glargine (LANTUS SOLOSTAR) 100 UNIT/ML Solostar Pen, Inject 8 Units into the skin daily., Disp: , Rfl:    Insulin Pen Needle 32G X 4 MM MISC, Use to inject insulin once a day or as directed, Disp: , Rfl:    Insulin Syringe-Needle U-100 (INSULIN SYRINGE 1CC/31GX5/16) 31G X 5/16 1 ML MISC, , Disp: , Rfl:    levocetirizine (XYZAL ) 5 MG tablet, TAKE 1 TABLET BY MOUTH AT  BEDTIME, Disp: 90 tablet, Rfl: 0   lisinopril  (ZESTRIL ) 10 MG tablet, Take 1 tablet (10 mg total) by mouth daily., Disp: 90 tablet, Rfl: 3   metFORMIN (GLUCOPHAGE) 1000 MG tablet, Take 1,000 mg by mouth 2 (two) times daily with a meal., Disp: , Rfl:    montelukast  (SINGULAIR ) 10 MG tablet, Take 1 tablet (10 mg total) by mouth at bedtime., Disp: 90 tablet, Rfl: 3   Multiple Vitamin (MULTI-VITAMINS) TABS, Take 1 tablet twice a day, Disp: , Rfl:    ONETOUCH DELICA LANCETS 33G MISC, , Disp: , Rfl:    ONETOUCH VERIO test strip, ,  Disp: , Rfl:    pilocarpine (SALAGEN) 5 MG tablet, Take 5 mg by mouth 3 (three) times daily., Disp: , Rfl:    rosuvastatin  (CRESTOR ) 20 MG tablet, TAKE 1 TABLET BY MOUTH DAILY, Disp: 90 tablet, Rfl: 2   traZODone  (DESYREL ) 100 MG tablet, Take 100 mg by mouth at bedtime., Disp: , Rfl:    cyanocobalamin (VITAMIN B12) 1000 MCG tablet, Take 1,000 mcg by mouth daily. (Patient not taking: Reported on 12/16/2023), Disp: , Rfl:    hydrocortisone 2.5 %  cream, Apply topically. (Patient not taking: Reported on 12/16/2023), Disp: , Rfl:    nystatin -triamcinolone  ointment (MYCOLOG), Apply topically. (Patient not taking: Reported on 12/16/2023), Disp: , Rfl:   Social History: Social History   Tobacco Use   Smoking status: Former    Current packs/day: 0.00    Average packs/day: 1 pack/day for 33.0 years (33.0 ttl pk-yrs)    Types: Cigarettes    Start date: 80    Quit date: 07/03/2007    Years since quitting: 16.4   Smokeless tobacco: Never  Substance Use Topics   Alcohol use: Yes    Comment: seldom   Drug use: Yes    Frequency: 7.0 times per week    Types: Marijuana    Comment: 2 bowls per day    Family Medical History: Family History  Problem Relation Age of Onset   Heart disease Mother    Cancer Mother 69       Multiple myeloma   Diabetes Father    Diabetes Brother    Heart disease Brother 3       cardiomyopathy   Heart disease Brother 24       MI   Arthritis Paternal Grandmother    Diabetes Paternal Grandmother    Heart disease Paternal Grandmother    Stroke Paternal Grandmother     Physical Examination: Vitals:   12/16/23 1051  BP: 120/78    General: Patient is in no apparent distress. Attention to examination is appropriate.  Neck:   Supple.  Full range of motion.  Respiratory: Patient is breathing without any difficulty.   NEUROLOGICAL:     Awake, alert, oriented to person, place, and time.  Speech is clear and fluent.   Cranial Nerves: Pupils equal round and  reactive to light.  Facial tone is symmetric.  Facial sensation is decreased to light touch in V1 through V3 on the right compared to the left.  No nystagmus noted.  No 6th nerve palsy noted.  No considerable difference in masseter function noted on examination.. Shoulder shrug is symmetric. Tongue protrusion is midline.    Strength: No evidence of pronator drift.  Full strength proximally and distally.  Gait is normal.    Imaging: Narrative & Impression  CLINICAL DATA:  Right-sided facial numbness, primarily V2 V3 distribution now completely involving entire right side of face. Sometimes with numbness tingling and taste affected. No significant findings on endoscopy to explain symptoms. Concern for central etiology.   EXAM: MRI FACE TRIGEMINAL WITHOUT AND WITH CONTRAST   TECHNIQUE: Multiplanar, multi-echo pulse sequences of the face and surrounding structures, including thin-slice imaging of the trigeminal nerves, were acquired before and after intravenous contrast administration.   CONTRAST:  9mL GADAVIST  GADOBUTROL  1 MMOL/ML IV SOLN   COMPARISON:  CT paranasal sinus 11/12/2007.   FINDINGS: Limited intracranial/Trigeminal nerves: There is soft tissue filling right Meckel's cave with associated enhancing mass noted on postcontrast images which measures approximately 1.5 x 0.9 x 1.1 cm (series 11, image 8, series 10, image 17). There is an additional component of the mass extending into the right ventrolateral aspect of the prepontine cistern which may involve the anterior aspect of the tentorial leaflet. This portion of the mass measures approximately 1.3 x 0.5 x 0.6 cm (series 11, image 6, series 10, image 15. The posterior aspect of the mass abuts the right carotid canal without evidence of disruption of the carotid flow void. Left Meckel's cave is unremarkable. The cisternal and canalicular portions of the left trigeminal  nerve are unremarkable. The portion of the mass  along the anterior aspect of the tentorium abuts the cisternal segment of the right trigeminal nerve resulting in lateral and slight inferior displacement of the nerve. The canalicular portion of the right trigeminal nerve is not well visualized.   Vascular: As above mass within Meckel's cave extending along the tentorium appears to abut the petrous portion of the right carotid canal. Skull base flow voids are intact.   Sinuses/Orbits: The orbits are symmetric. Visualized extraocular muscles and optic nerves are unremarkable. Normal appearance of the intraorbital fat. There is no significant mucosal thickening within the paranasal sinuses.   Soft tissues: Visualized soft tissues are unremarkable. There is no abnormal signal or enhancement appreciated along the visualized extracranial portions of the trigeminal nerves.   Osseous: Degenerative changes in the visualized upper cervical spine. Visualized maxillofacial bones without acute abnormality.   Other: Small right mastoid effusion.   IMPRESSION: Enhancing mass filling right Meckel's cave with additional component extending along the anterior aspect of the right tentorial leaflet as described above. Favor meningioma given apparent dural involvement although trigeminal schwannoma is an additional consideration.   Resulting mass effect on the cisternal segment of the right trigeminal nerve with lateral and slight inferior displacement. Canalicular segment of the right trigeminal nerve is not visualized.   Mass abuts the petrous portion of the right carotid canal without abnormality of the flow void. Consider CTA for further evaluation.   Unremarkable appearance of the left trigeminal nerve and left Meckel's cave.   Small right mastoid effusion.     Electronically Signed   By: Denny Flack M.D.   On: 11/21/2023 21:39    I have personally reviewed the images and agree with the above interpretation.  Medical Decision  Making/Assessment and Plan: Mr. Zaremba is a pleasant 68 y.o. male with progressive right sided facial numbness and disturbances.  Was being worked up for trigeminal neuralgia, found to have a likely meningioma or dural based tumor at the [employer] location.  It started at the inferior face and is spread to the entire face.  On clinical examination he has decreased sensation in all 3 distributions of the trigeminal nerve, he has no abducens or facial nerve involvement at this time.  His MRI demonstrated the small Meckel's cave dural based mass this is likely the cause of his progressive sensation issues.  I will plan on having him see our skull based specialist Dr. Janjua for evaluation.  I did increase his gabapentin , he is currently at 100 nightly, I have provided him with a taper and discussed side effects to look for as we are uptitrating his level to a therapeutic level.  Thank you for involving me in the care of this patient.    Carroll Clamp MD/MSCR Neurosurgery

## 2023-12-16 ENCOUNTER — Ambulatory Visit: Admitting: Neurosurgery

## 2023-12-16 ENCOUNTER — Encounter: Payer: Self-pay | Admitting: Neurosurgery

## 2023-12-16 VITALS — BP 120/78 | Ht 71.0 in | Wt 207.2 lb

## 2023-12-16 DIAGNOSIS — G509 Disorder of trigeminal nerve, unspecified: Secondary | ICD-10-CM

## 2023-12-16 DIAGNOSIS — D329 Benign neoplasm of meninges, unspecified: Secondary | ICD-10-CM | POA: Diagnosis not present

## 2023-12-16 MED ORDER — GABAPENTIN 300 MG PO CAPS: ORAL_CAPSULE | ORAL | 0 refills | Status: DC

## 2023-12-19 ENCOUNTER — Ambulatory Visit (INDEPENDENT_AMBULATORY_CARE_PROVIDER_SITE_OTHER): Admitting: Clinical

## 2023-12-19 DIAGNOSIS — Z91198 Patient's noncompliance with other medical treatment and regimen for other reason: Secondary | ICD-10-CM

## 2023-12-19 NOTE — Progress Notes (Signed)
 Therapy Progress Note  Patient had an appointment scheduled with therapist on 12/19/2023  at 2:00pm.  CSW called patient at 2:08pm and was informed that he forgot.  3 days ago he was diagnosed with a brain tumor  We discussed this and the impact on him for a few minutes.  He did not opt to do the session virtually, preferred to call in for another appointment.  He apologized profusely.    Encounter Diagnosis  Name Primary?   Failure to attend appointment with reason given Yes     Leotha Rang, LCSW 12/19/2023, 2:16 PM

## 2023-12-24 ENCOUNTER — Telehealth: Payer: Self-pay | Admitting: Neurosurgery

## 2023-12-24 NOTE — Telephone Encounter (Signed)
 3 capsules 3 times a day and did not realize he was tapering up. He also thought the capsules were 100mg  not 300mg . So he has been taking 900mg  3 times a day. He is now taking 1 300mg  3 times a day. Do you need him to make any other adjustments?

## 2023-12-24 NOTE — Telephone Encounter (Signed)
 Patient was seen on 6/16 and Dr.Smith prescribed him a Gabapentin  taper. The patient did not notice until Sunday night after he took the medication that he had been taking 3 x 300mg . He thought the capsules were 100mg . He said that Monday night he took the correct dosage of 300mg  nightly. Now he says that the sharpe pains are coming back and he woke up with a bad headache. Should he continue the taper like he was supposed to? He is scheduled to see Dr.Janjua on 01/14/2024. Patient is aware that Dr.Smith is in surgery today we will try to call him back in 24 hours.

## 2023-12-25 NOTE — Telephone Encounter (Signed)
 Patient notified and voiced understanding.

## 2023-12-26 ENCOUNTER — Telehealth: Payer: Self-pay | Admitting: Neurosurgery

## 2023-12-26 ENCOUNTER — Telehealth: Payer: Self-pay

## 2023-12-26 NOTE — Telephone Encounter (Signed)
 Copied from CRM [id number]. Topic: Clinical - Medication Question >> Dec 26, 2023  2:50 PM Santiya F wrote: Reason for CRM: Patient is calling in because he was told to reach out to his provider regarding getting medication for ED. Patient wants to know if his doctor would be able to prescribe him something for it. Please advise.

## 2023-12-26 NOTE — Telephone Encounter (Signed)
 I spoke to patient about message below. Patient states he does not want to come off of the Gabapentin . He think the medication is helping and he has had the headaches since before he started taking Gabapentin . He also states he is having ED and wants to know if this can be a side effect from the Gabapentin . He is wanting to get a pill for this as well.     Note from [employer], PA-C Needs to back off gabapentin ., taper down completely. Tylenol  for headache.

## 2023-12-26 NOTE — Telephone Encounter (Signed)
 Patient is calling to let our office know that his headaches have gotten worse over the last three mornings lasting 20-30 minutes each time. He states that he is not sure if the Gabapentin  is working but the other symptoms that he was having at his appointment with Dr. Claudene are not as bad as they were. Please advise.

## 2023-12-26 NOTE — Telephone Encounter (Signed)
 Patient notified and voiced understanding.

## 2023-12-30 ENCOUNTER — Telehealth: Payer: Self-pay | Admitting: Neurosurgery

## 2023-12-30 ENCOUNTER — Other Ambulatory Visit: Payer: Self-pay | Admitting: Physician Assistant

## 2023-12-30 DIAGNOSIS — G509 Disorder of trigeminal nerve, unspecified: Secondary | ICD-10-CM

## 2023-12-30 DIAGNOSIS — D329 Benign neoplasm of meninges, unspecified: Secondary | ICD-10-CM

## 2023-12-30 MED ORDER — GABAPENTIN 300 MG PO CAPS: ORAL_CAPSULE | ORAL | 0 refills | Status: DC

## 2023-12-30 NOTE — Telephone Encounter (Signed)
 Patient notified

## 2023-12-30 NOTE — Telephone Encounter (Signed)
 Please notify the patient that the gabapentin  was sent in.

## 2023-12-30 NOTE — Telephone Encounter (Signed)
 Prescription Request  12/30/2023  LOV: 12/16/2023  What is the name of the medication or equipment? gabapentin  (NEURONTIN ) 300 MG capsule    Have you contacted your pharmacy to request a refill? No   Which pharmacy would you like this sent to?  [id number] [address], [place] [facility] [address] [place] [address] Phone: [phone] Fax: [phone]  [facility] Delivery) - [place], [place] - [address] [facility] [address] [place] [address] [place] [address] Phone: [phone] Fax: [phone]  [facility] Delivery - [place], [place] - [address] [place] [address] Phone: [phone] Fax: [phone]    Patient notified that their request is being sent to the clinical staff for review and that they should receive a response within 2 business days.   Please advise at [facility] [phone]   Patient states he will be completely out on Wednesday

## 2023-12-31 ENCOUNTER — Telehealth (INDEPENDENT_AMBULATORY_CARE_PROVIDER_SITE_OTHER): Admitting: Family Medicine

## 2023-12-31 DIAGNOSIS — D329 Benign neoplasm of meninges, unspecified: Secondary | ICD-10-CM | POA: Diagnosis not present

## 2023-12-31 DIAGNOSIS — N522 Drug-induced erectile dysfunction: Secondary | ICD-10-CM | POA: Diagnosis not present

## 2023-12-31 MED ORDER — TADALAFIL 20 MG PO TABS: 20.0000 mg | ORAL_TABLET | Freq: Every day | ORAL | 1 refills | Status: DC | PRN

## 2023-12-31 NOTE — Progress Notes (Signed)
   Subjective:    Patient ID: [name], male    DOB: [birth year], 68 y.o.   MRN: [id number]  HPI Documentation for virtual audio and video telecommunications through Caregility encounter:  The patient was located at home. 2 patient identifiers used.  The provider was located in the office. The patient did consent to this visit and is aware of possible charges through their insurance for this visit.  The other persons participating in this telemedicine service were none. Time spent on call was 5 minutes and in review of previous records >15 minutes total for counseling and coordination of care.  This virtual service is not related to other E/M service within previous 7 days.  He was recently diagnosed with a meningioma that was causing his trigeminal nerve issues.  He was placed on gabapentin  however the gabapentin  has caused erectile dysfunction and he would like medication for that.  Review of Systems     Objective:    Physical Exam Alert and in no distress otherwise not examined       Assessment & Plan:  Drug-induced erectile dysfunction - Plan: tadalafil (CIALIS) 20 MG tablet  Meningioma (HCC) I will give him Cialis to use.  Explained that this can be used on an as-needed basis and that the effect of the medication can last quite some time.  He will keep me informed concerning this.  Also discussed the use of GoodRx if the cost is high.

## 2024-01-07 LAB — HM DIABETES EYE EXAM

## 2024-01-13 DIAGNOSIS — H04129 Dry eye syndrome of unspecified lacrimal gland: Secondary | ICD-10-CM | POA: Insufficient documentation

## 2024-01-13 DIAGNOSIS — J3081 Allergic rhinitis due to animal (cat) (dog) hair and dander: Secondary | ICD-10-CM | POA: Insufficient documentation

## 2024-01-13 DIAGNOSIS — L209 Atopic dermatitis, unspecified: Secondary | ICD-10-CM | POA: Insufficient documentation

## 2024-01-13 DIAGNOSIS — J301 Allergic rhinitis due to pollen: Secondary | ICD-10-CM | POA: Insufficient documentation

## 2024-01-14 ENCOUNTER — Encounter: Payer: Self-pay | Admitting: Neurosurgery

## 2024-01-14 ENCOUNTER — Ambulatory Visit: Admitting: Neurosurgery

## 2024-01-14 VITALS — BP 114/72 | HR 64 | Ht 71.0 in | Wt 212.0 lb

## 2024-01-14 DIAGNOSIS — D329 Benign neoplasm of meninges, unspecified: Secondary | ICD-10-CM

## 2024-01-14 DIAGNOSIS — G9389 Other specified disorders of brain: Secondary | ICD-10-CM | POA: Diagnosis not present

## 2024-01-14 NOTE — Progress Notes (Unsigned)
 Assessment : 68 year old gentleman with a history of obesity who about 9 months ago started noticing numbness in the right side of his face.  This was progressive and initially it was only in the lower part of his face but slowly on crept up and involve the top part of his face as well.  After this, he also started noticing neuropathic pains in his face as well as on the inside of his mouth.  Oddly, he did not notice any sensory deficits in his tongue.  Patient went and saw Dr. Tobie, otolaryngologist who recommended imaging and started him on gabapentin  which made his symptoms better.  Imaging suggested a mass in the right Meckel's cave and patient was then referred to Dr. Penne Sharps, my colleague neurosurgeon, who evaluated the patient and raise the dose to 300 mg 3 times a day which has given him considerable relief.  Since then he says his numbness is better as well as the pain.  He rates it at a 5 out of 10 as opposed to the 10 that it used to be prior to treatment.  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  Recently he had foot surgery which limited his activity and resulting in weight gain which he is not very happy with.   Plan : I reviewed the images and this shows a mass in the Meckel's cave extending into the middle cranial fossa as well as through the porous trigeminal us  into the posterior fossa. I went over the differential diagnosis with him. Firstly this could be something called pseudotumor which is an inflammatory condition of the trigeminal nerve in the Meckel's cave.  This is typically self-limiting and affects middle aged men.  Typically this is self-limiting but it can be treated with steroids.  I discussed this option with him and he is very much against taking steroids because these make him gain weight and he has fought weight gain all his life and does not want to rebound but he will think about it.  Second option would be is to consider this a  trigeminal schwannoma, unlikely, or a meningioma which would be more likely given the radiographic appearance.  I told him that in either of these, we can get a biopsy of it but I think resecting the whole tumor will render him with neurological deficits which may be very undesirable and possibility of permanent numbness.  I would then recommend getting a decompressive biopsy but then to proceed with the third option, is to consider radiosurgery.  Alternatively, he could proceed with getting radiosurgery if the radiation oncology team is comfortable radiating this without getting a tissue diagnosis.  I do not think that there is a right or wrong answer here but I would then recommend getting a consultation with the radiation oncology team.  Lastly, this could be a lymphopoietic disease along the lines of lymphoma in 1 would consider this if you think about the course of the disease with progressive neurological deficits.  Obviously, for this a biopsy would be important but we will also be able to get some idea with some blood work and a lumbar puncture with cytology though not always does the diagnosis to reveal itself with these.  We decided that I am going to see him back next week and he is going to give this some thought and come back and see me.   Social History   Socioeconomic History   Marital status: Single  Spouse name: Not on file   Number of children: Not on file   Years of education: Not on file   Highest education level: Not on file  Occupational History   Not on file  Tobacco Use   Smoking status: Former    Current packs/day: 0.00    Average packs/day: 1 pack/day for 33.0 years (33.0 ttl pk-yrs)    Types: Cigarettes    Start date: 89    Quit date: 07/03/2007    Years since quitting: 16.5   Smokeless tobacco: Never  Substance and Sexual Activity   Alcohol use: Yes    Comment: seldom   Drug use: Yes    Frequency: 7.0 times per week    Types: Marijuana    Comment: 2 bowls  per day   Sexual activity: Not Currently  Other Topics Concern   Not on file  Social History Narrative   Pt lives alone    Pt works    Social Drivers of Corporate investment banker Strain: Not on Ship broker Insecurity: Not on file  Transportation Needs: Not on file  Physical Activity: Not on file  Stress: Not on file  Social Connections: Not on file  Intimate Partner Violence: Not on file    Family History  Problem Relation Age of Onset   Heart disease Mother    Cancer Mother 31       Multiple myeloma   Diabetes Father    Diabetes Brother    Heart disease Brother 32       cardiomyopathy   Heart disease Brother 38       MI   Arthritis Paternal Grandmother    Diabetes Paternal Grandmother    Heart disease Paternal Grandmother    Stroke Paternal Grandmother     Allergies  Allergen Reactions   Hydrogen Peroxide     Peeling of gums per patient    Latex Itching    Past Medical History:  Diagnosis Date   Anxiety    Arthritis    back, shoulders, left great toe   Candidiasis, mouth 03/22/2016   Carpal tunnel syndrome of right wrist 10/2014   Dental crowns present    Depression    Dyslipidemia    Ear fullness 08/30/2016   GERD (gastroesophageal reflux disease)    Hypertension    under control with med., per pt.; has been on med. x 15-20 yr.   Insulin dependent diabetes mellitus    Nummular eczema    Obesity    Onychomycosis 08/30/2016   Recurrent infections 03/22/2016   Sleep apnea    uses CPAP nightly    Past Surgical History:  Procedure Laterality Date   CARDIAC CATHETERIZATION  12/30/2006   mod. pulmonary HTN with preserved cardiac output and cardiac index   CARPAL TUNNEL RELEASE Right 10/14/2014   Procedure: RIGHT CARPAL TUNNEL RELEASE;  Surgeon: Franky Curia, MD;  Location: [facility];  Service: Orthopedics;  Laterality: Right;   CARPAL TUNNEL RELEASE Left 05/19/2015   Procedure: LEFT CARPAL TUNNEL RELEASE;  Surgeon: Franky Curia, MD;   Location:  SUR[facility]ERY C[facility];  Service: Orthopedics;  Laterality: Left;   CYST EXCISION     scalp and back   ESOPHAGOGASTRODUODENOSCOPY  03/25/2007   FOOT SURGERY Left    revision traumatic amputation of foot   ROUX-EN-Y PROCEDURE  07/28/2007   with lysis of adhesions    Physical Exam HENT:     Head: Normocephalic.     Nose: Nose normal.  Eyes:     Pupils: Pupils are equal, round, and reactive to light.  Cardiovascular:     Rate and Rhythm: Normal rate.  Pulmonary:     Effort: Pulmonary effort is normal.  Abdominal:     General: Abdomen is flat.  Musculoskeletal:     Cervical back: Normal range of motion.  Neurological:     Mental Status: He is alert.     Cranial Nerves: Cranial nerve deficit present.     Motor: Motor function is intact.     Comments: Slight numbness V2,3 on the RIGHT

## 2024-01-21 ENCOUNTER — Telehealth: Payer: Self-pay | Admitting: Neurosurgery

## 2024-01-21 ENCOUNTER — Ambulatory Visit (INDEPENDENT_AMBULATORY_CARE_PROVIDER_SITE_OTHER): Payer: Self-pay | Admitting: Neurosurgery

## 2024-01-21 ENCOUNTER — Other Ambulatory Visit: Payer: Self-pay | Admitting: Radiation Therapy

## 2024-01-21 VITALS — BP 115/70 | HR 70 | Ht 71.0 in | Wt 209.0 lb

## 2024-01-21 DIAGNOSIS — D329 Benign neoplasm of meninges, unspecified: Secondary | ICD-10-CM

## 2024-01-21 DIAGNOSIS — G509 Disorder of trigeminal nerve, unspecified: Secondary | ICD-10-CM

## 2024-01-21 NOTE — Telephone Encounter (Signed)
 Patient had an appointment in the office today, but left without being seen.  He stated that he could not wait any longer.  Patient called the office and rescheduled today's visit to 8/12.  Patient called back once more the request a refill of his gabapentin  medication.  He states that if we would prescribe a 3 month supply, he would like it to be sent to [facility]  If only a 38-month supply, he would like it sent to the local [facility] pharmacy in [place].  Patient indicated that he would like to proceed with scheduling the biopsy and the consult with radiation oncology.

## 2024-01-21 NOTE — Progress Notes (Signed)
MDT

## 2024-01-22 ENCOUNTER — Other Ambulatory Visit: Payer: Self-pay | Admitting: Radiation Therapy

## 2024-01-22 ENCOUNTER — Telehealth: Payer: Self-pay | Admitting: Neurosurgery

## 2024-01-22 ENCOUNTER — Telehealth: Payer: Self-pay | Admitting: Radiation Therapy

## 2024-01-22 DIAGNOSIS — G509 Disorder of trigeminal nerve, unspecified: Secondary | ICD-10-CM

## 2024-01-22 DIAGNOSIS — R200 Anesthesia of skin: Secondary | ICD-10-CM

## 2024-01-22 DIAGNOSIS — D329 Benign neoplasm of meninges, unspecified: Secondary | ICD-10-CM

## 2024-01-22 NOTE — Progress Notes (Signed)
 Pt left before being seen

## 2024-01-22 NOTE — Telephone Encounter (Signed)
 I spoke to the patient and told him that I had communicated with the RadOnc team and they are willing to see him to talk about SRS without tissue sample. He was happy to hear this and I have passed this on to their team to schedule an appointment.

## 2024-01-22 NOTE — Telephone Encounter (Signed)
 I called and spoke with pt about the referral entered by Dr. Janjua for him to see a Radiation Oncologist to discuss treatment options. He has an appointment to meet with Dr. Izell on Tuesday 7/29. [name] was thankful for the call.   Devere Perch R.T(R)(T) Radiation Special Procedures Lead

## 2024-01-27 ENCOUNTER — Inpatient Hospital Stay: Attending: Radiation Oncology

## 2024-01-27 ENCOUNTER — Other Ambulatory Visit: Payer: Self-pay | Admitting: Physician Assistant

## 2024-01-27 ENCOUNTER — Other Ambulatory Visit: Payer: Self-pay | Admitting: Radiation Therapy

## 2024-01-27 DIAGNOSIS — D329 Benign neoplasm of meninges, unspecified: Secondary | ICD-10-CM

## 2024-01-27 DIAGNOSIS — G509 Disorder of trigeminal nerve, unspecified: Secondary | ICD-10-CM

## 2024-01-27 NOTE — Progress Notes (Signed)
 Radiation Oncology         [phone] ________________________________  Initial outpatient Consultation  Name: [name] MRN: [id number]  Date: 01/28/2024  DOB: Sep 10, 1955  RR:Ojonwiz, Norleen BROCKS, MD  Rosslyn Dino HERO, MD   REFERRING PHYSICIAN: Rosslyn Dino HERO, MD  DIAGNOSIS: No diagnosis found.   Cancer Staging  No matching staging information was found for the patient.   CHIEF COMPLAINT: Here to discuss management of facial numbness   HISTORY OF PRESENT ILLNESS::[name] is a 68 y.o. male who presented to Dr. Tobie on 10/23/23 with complains of worsening right facial numbness and pain that has persisted for the prior 9 months.   To further investigate his findings, he underwent a face trigeminal MRI on 11/01/23 showing a soft tissue filling right Meckel's cave with associated enhancing mass measuring approximately 1.5 x 0.9 x 1.1 cm.scan also noted an additional component of the mass extending into the right ventrolateral aspect of the prepontine cistern which may involve the anterior aspect of the tentorial leaflet measuring approximately 1.3 x 0.5 x 0.6 cm.   As a result, he was prescribed gabapentin  100 mg nightly to relief pain without much relief. Additionally, as patient was experiencing worsening symptoms such as right eye weakness intermittent tingling. Subsequently, he was referred to Dr. Penne Sharps On 12/16/23. Physical exam revealed decreased sensation in all 3 distributions of the trigeminal nerve, he has no abducens or facial nerve involvement at this time. Dr. Sharps then increased gabapentin  to 300 mg 3 times a day and referred him to skull specialist.   He was seen by Dr. Janjua on 01/14/24 to discuss further plans. Dr. Janjua explained that his symptoms can be caused by several possible diagnosis including meningioma, pseudotumor, or lymphopoietic disease. He recommended undergoing radiotherapy as patient is against taking steroids.   PREVIOUS RADIATION  THERAPY: No  PAST MEDICAL HISTORY:  has a past medical history of Anxiety, Arthritis, Candidiasis, mouth (03/22/2016), Carpal tunnel syndrome of right wrist (10/2014), Dental crowns present, Depression, Dyslipidemia, Ear fullness (08/30/2016), GERD (gastroesophageal reflux disease), Hypertension, Insulin dependent diabetes mellitus, Nummular eczema, Obesity, Onychomycosis (08/30/2016), Recurrent infections (03/22/2016), and Sleep apnea.    PAST SURGICAL HISTORY: Past Surgical History:  Procedure Laterality Date   CARDIAC CATHETERIZATION  12/30/2006   mod. pulmonary HTN with preserved cardiac output and cardiac index   CARPAL TUNNEL RELEASE Right 10/14/2014   Procedure: RIGHT CARPAL TUNNEL RELEASE;  Surgeon: Franky Curia, MD;  Location: [facility];  Service: Orthopedics;  Laterality: Right;   CARPAL TUNNEL RELEASE Left 05/19/2015   Procedure: LEFT CARPAL TUNNEL RELEASE;  Surgeon: Franky Curia, MD;  Location: [facility];  Service: Orthopedics;  Laterality: Left;   CYST EXCISION     scalp and back   ESOPHAGOGASTRODUODENOSCOPY  03/25/2007   FOOT SURGERY Left    revision traumatic amputation of foot   ROUX-EN-Y PROCEDURE  07/28/2007   with lysis of adhesions    FAMILY HISTORY: family history includes Arthritis in his paternal grandmother; Cancer (age of onset: 15) in his mother; Diabetes in his brother, father, and paternal grandmother; Heart disease in his mother and paternal grandmother; Heart disease (age of onset: 52) in his brother; Heart disease (age of onset: 71) in his brother; Stroke in his paternal grandmother.  SOCIAL HISTORY:  reports that he quit smoking about 16 years ago. His smoking use included cigarettes. He started smoking about 49 years ago. He has a 33 pack-year smoking history. He has never  used smokeless tobacco. He reports current alcohol use. He reports current drug use. Frequency: 7.00 times per week. Drug: Marijuana.  ALLERGIES: Hydrogen peroxide  and Latex  MEDICATIONS:  Current Outpatient Medications  Medication Sig Dispense Refill   acetaminophen  (TYLENOL ) 500 MG tablet Take 500 mg by mouth every 6 (six) hours as needed for mild pain (pain score 1-3).     ASPIRIN  LOW DOSE 81 MG EC tablet TAKE 1 TABLET BY MOUTH AT  BEDTIME . SWALLOW WHOLE. 30 tablet 0   Azelastine  HCl 137 MCG/SPRAY SOLN Place 1 spray into both nostrils daily. 30 mL 3   B Complex-C (B-COMPLEX WITH VITAMIN C) tablet Take 1 tablet by mouth 2 (two) times daily.     buPROPion  (WELLBUTRIN  SR) 200 MG 12 hr tablet Take 1 tablet (200 mg total) by mouth 2 (two) times daily. 180 tablet 4   calcium -vitamin D 250-100 MG-UNIT per tablet Take 1 tablet by mouth 2 (two) times daily.     celecoxib  (CELEBREX ) 100 MG capsule TAKE 1 CAPSULE BY MOUTH TWICE  DAILY 180 capsule 3   cetirizine (ZYRTEC) 10 MG tablet Take 10 mg by mouth daily.     Continuous Blood Gluc Sensor (FREESTYLE LIBRE 2 SENSOR) MISC Now on Freestyle Libre 3     Continuous Glucose Receiver (FREESTYLE LIBRE 2 READER) DEVI Scan as needed for continuous glucose monitoring.     cyanocobalamin (VITAMIN B12) 1000 MCG tablet Take 1,000 mcg by mouth daily.     DENTA 5000 PLUS 1.1 % CREA dental cream Take by mouth.     desvenlafaxine  (PRISTIQ ) 50 MG 24 hr tablet 2 qam 180 tablet 0   empagliflozin (JARDIANCE) 25 MG TABS tablet Take 25 mg by mouth daily.     empagliflozin (JARDIANCE) 25 MG TABS tablet Take 25 mg by mouth daily.     Ferrous Sulfate  (IRON  SLOW RELEASE) 142 (45 Fe) MG TBCR Take 1 tablet by mouth daily. 30 tablet 5   fluticasone  (FLONASE ) 50 MCG/ACT nasal spray USE 1 SPRAY NASALLY ONCE A  DAY FOR ALLERGIES 32 g 0   gabapentin  (NEURONTIN ) 300 MG capsule TAKE 1 CAPSULE (300 MG TOTAL) BY MOUTH AT BEDTIME FOR 14 DAYS, THEN 1 CAPSULE (300 MG TOTAL) 2 (TWO) TIMES DAILY FOR 14 DAYS, THEN 1 CAPSULE (300 MG TOTAL) 3 (THREE) TIMES DAILY FOR 14 DAYS. 84 capsule 0   glucose blood test strip USE TO CHECK SUGAR 4 TIMES  DAILY.      hydrocortisone 2.5 % cream Apply topically.     insulin glargine (LANTUS SOLOSTAR) 100 UNIT/ML Solostar Pen Inject 8 Units into the skin daily.     Insulin Pen Needle 32G X 4 MM MISC Use to inject insulin once a day or as directed     Insulin Syringe-Needle U-100 (INSULIN SYRINGE 1CC/31GX5/16) 31G X 5/16 1 ML MISC      levocetirizine (XYZAL ) 5 MG tablet TAKE 1 TABLET BY MOUTH AT  BEDTIME 90 tablet 0   lisinopril  (ZESTRIL ) 10 MG tablet Take 1 tablet (10 mg total) by mouth daily. 90 tablet 3   metFORMIN (GLUCOPHAGE) 1000 MG tablet Take 1,000 mg by mouth 2 (two) times daily with a meal.     montelukast  (SINGULAIR ) 10 MG tablet Take 1 tablet (10 mg total) by mouth at bedtime. 90 tablet 3   Multiple Vitamin (MULTI-VITAMINS) TABS Take 1 tablet twice a day     nystatin -triamcinolone  ointment (MYCOLOG) Apply topically. (Patient not taking: Reported on 01/21/2024)  omeprazole  (PRILOSEC) 20 MG capsule Take 1 capsule by mouth daily. (Patient not taking: Reported on 01/21/2024)     ONETOUCH DELICA LANCETS 33G MISC      ONETOUCH VERIO test strip      pilocarpine (SALAGEN) 5 MG tablet Take 5 mg by mouth 3 (three) times daily.     Polyethyl Glycol-Propyl Glycol (SYSTANE) 0.4-0.3 % SOLN as directed Ophthalmic     rosuvastatin  (CRESTOR ) 20 MG tablet TAKE 1 TABLET BY MOUTH DAILY 90 tablet 2   tadalafil  (CIALIS ) 20 MG tablet Take 1 tablet (20 mg total) by mouth daily as needed for erectile dysfunction. 30 tablet 1   tirzepatide (MOUNJARO) 2.5 MG/0.5ML Pen Inject 2.5 mg into the skin once a week. (Patient not taking: Reported on 01/21/2024)     traZODone  (DESYREL ) 100 MG tablet Take 100 mg by mouth at bedtime.     No current facility-administered medications for this encounter.    REVIEW OF SYSTEMS:  Notable for that above.   PHYSICAL EXAM:  vitals were not taken for this visit.   General: Alert and oriented, in no acute distress *** HEENT: Head is normocephalic. Extraocular movements are intact. Oropharynx  is clear. Neck: Neck is supple, no palpable cervical or supraclavicular lymphadenopathy. Heart: Regular in rate and rhythm with no murmurs, rubs, or gallops. Chest: Clear to auscultation bilaterally, with no rhonchi, wheezes, or rales. Abdomen: Soft, nontender, nondistended, with no rigidity or guarding. Extremities: No cyanosis or edema. Lymphatics: see Neck Exam Skin: No concerning lesions. Musculoskeletal: symmetric strength and muscle tone throughout. Neurologic: Cranial nerves II through XII are grossly intact. No obvious focalities. Speech is fluent. Coordination is intact. Psychiatric: Judgment and insight are intact. Affect is appropriate.   ECOG = ***  0 - Asymptomatic (Fully active, able to carry on all predisease activities without restriction)  1 - Symptomatic but completely ambulatory (Restricted in physically strenuous activity but ambulatory and able to carry out work of a light or sedentary nature. For example, light housework, office work)  2 - Symptomatic, <50% in bed during the day (Ambulatory and capable of all self care but unable to carry out any work activities. Up and about more than 50% of waking hours)  3 - Symptomatic, >50% in bed, but not bedbound (Capable of only limited self-care, confined to bed or chair 50% or more of waking hours)  4 - Bedbound (Completely disabled. Cannot carry on any self-care. Totally confined to bed or chair)  5 - Death   Raylene MM, Creech RH, Tormey DC, et al. [phone]). Toxicity and response criteria of the [facility]. Am. DOROTHA Bridges. Oncol. 5 (6): 649-55   LABORATORY DATA:  Lab Results  Component Value Date   WBC 8.8 10/24/2023   HGB 15.1 10/24/2023   HCT 44.1 10/24/2023   MCV 94.0 10/24/2023   PLT 230 10/24/2023   CMP     Component Value Date/Time   NA 140 10/24/2023 1156   NA 143 10/29/2022 1302   K 4.6 10/24/2023 1156   CL 106 10/24/2023 1156   CO2 26 10/24/2023 1156   GLUCOSE 101 10/24/2023  1156   BUN 16 10/24/2023 1156   BUN 17 10/29/2022 1302   CREATININE 0.83 10/24/2023 1156   CALCIUM  9.5 10/24/2023 1156   PROT 6.2 10/24/2023 1156   PROT 6.3 07/03/2022 1630   ALBUMIN 4.4 07/03/2022 1630   AST 17 10/24/2023 1156   ALT 18 10/24/2023 1156   ALKPHOS 68 07/03/2022 1630  BILITOT 0.4 10/24/2023 1156   BILITOT 0.4 07/03/2022 1630   EGFR 99 10/29/2022 1302   GFRNONAA >60 04/05/2018 1542         RADIOGRAPHY: No results found.    IMPRESSION/PLAN:***    On date of service, in total, I spent *** minutes on this encounter. Patient was seen in person.   __________________________________________   Lauraine Golden, MD  This document serves as a record of services personally performed by Lauraine Golden, MD. It was created on her behalf by Reymundo Cartwright, a trained medical scribe. The creation of this record is based on the scribe's personal observations and the provider's statements to them. This document has been checked and approved by the attending provider.

## 2024-01-27 NOTE — Progress Notes (Signed)
 Location/Histology of Brain Tumor:  Meningoma   Patient presented with symptoms of:    Patient noticed he began developing numbness on the right of his face. This was progressive and initially it was only in the lower part of his face but slowly crept up and involve the top part of his face as well. After this he also started noticing neuropathic pains in his face as well as on the inside of his mouth  Past or anticipated interventions, if any, per neurosurgery: {:18581}  Past or anticipated interventions, if any, per medical oncology: {:18581}  Dose of Decadron , if applicable: {:18581}  Recent neurologic symptoms, if any:  Seizures: {:18581} Headaches: {:18581} Nausea: {:18581} Dizziness/ataxia: {:18581} Difficulty with hand coordination: {:18581} Focal numbness/weakness: {:18581} Visual deficits/changes: {:18581} Confusion/Memory deficits: {:18581}  Painful bone metastases at present, if any: {:18581}  SAFETY ISSUES: Prior radiation? {:18581} Pacemaker/ICD? {:18581} Possible current pregnancy? {:18581} Is the patient on methotrexate? {:18581}  Additional Complaints / other details: ***

## 2024-01-28 ENCOUNTER — Ambulatory Visit
Admission: RE | Admit: 2024-01-28 | Discharge: 2024-01-28 | Disposition: A | Discharge status: Home / Self Care | Source: Ambulatory Visit | Attending: Internal Medicine | Admitting: Internal Medicine

## 2024-01-28 ENCOUNTER — Encounter: Payer: Self-pay | Admitting: Radiation Oncology

## 2024-01-28 ENCOUNTER — Ambulatory Visit
Admission: RE | Admit: 2024-01-28 | Discharge: 2024-01-28 | Disposition: A | Discharge status: Home / Self Care | Source: Ambulatory Visit | Attending: Radiation Oncology

## 2024-01-28 ENCOUNTER — Ambulatory Visit
Admission: RE | Admit: 2024-01-28 | Discharge: 2024-01-28 | Disposition: A | Discharge status: Home / Self Care | Source: Ambulatory Visit | Attending: Radiation Oncology | Admitting: Radiation Oncology

## 2024-01-28 VITALS — BP 136/76 | HR 63 | Temp 97.9°F | Resp 18 | Ht 71.0 in | Wt 207.2 lb

## 2024-01-28 DIAGNOSIS — D320 Benign neoplasm of cerebral meninges: Secondary | ICD-10-CM | POA: Diagnosis present

## 2024-01-28 DIAGNOSIS — R200 Anesthesia of skin: Secondary | ICD-10-CM

## 2024-01-28 LAB — BUN & CREATININE (CHCC)
BUN: 19 mg/dL (ref 8–23)
Creatinine: 0.8 mg/dL (ref 0.61–1.24)
GFR, Estimated: >60 mL/min (ref >60)

## 2024-01-30 ENCOUNTER — Telehealth: Payer: Self-pay

## 2024-01-30 ENCOUNTER — Telehealth: Payer: Self-pay | Admitting: Radiation Therapy

## 2024-01-30 NOTE — Telephone Encounter (Signed)
 Returned a call to [name] to answer questions he had about his treatment schedule.  Devere Perch R.T(R)(T) Radiation Special Procedures Lead

## 2024-01-30 NOTE — Telephone Encounter (Signed)
 Patient called the office requesting a note for this doctor. He is taking off August 20th- September 1st for treatement of his Meningioma.   I

## 2024-01-31 DIAGNOSIS — L2089 Other atopic dermatitis: Secondary | ICD-10-CM | POA: Insufficient documentation

## 2024-02-04 ENCOUNTER — Ambulatory Visit (HOSPITAL_COMMUNITY): Admitting: Psychiatry

## 2024-02-04 VITALS — BP 125/79 | HR 78 | Ht 71.0 in | Wt 208.0 lb

## 2024-02-04 DIAGNOSIS — F324 Major depressive disorder, single episode, in partial remission: Secondary | ICD-10-CM | POA: Diagnosis not present

## 2024-02-04 MED ORDER — DESVENLAFAXINE SUCCINATE ER 50 MG PO TB24: ORAL_TABLET | ORAL | 0 refills | Status: DC

## 2024-02-04 MED ORDER — BUPROPION HCL ER (SR) 200 MG PO TB12: 200.0000 mg | ORAL_TABLET | Freq: Two times a day (BID) | ORAL | 4 refills | Status: AC

## 2024-02-04 NOTE — Progress Notes (Signed)
 Psychiatric Initial Adult Assessment   Patient Identification: [name] MRN:  [id number] Date of Evaluation:  02/04/2024 Referral Source: Dr. Dedra Dohmeier Chief Complaint: Depression Visit Diagnosis: Major depression recurrent  History of Present Illness:      Today the seen in the office.  This patient shares with me that he has had a diagnosis of meningioma that is the explanation for his right facial numbness.  He has been given the options of surgery versus prednisone versus radiation therapy.  He has chosen radiation therapy to deal with his meningioma which apparently is growing somewhat.  Throughout all this his mood seems to be stable.  He denies daily depression.  He is sleeping and eating well.  His energy level is reasonably good.  He is still working.  Patient still enjoys a lot of things in life.  His mood is good.  He has a dog.  He likes to read watch TV and listen to music.  He is really functioning very well.  The patient has no history of seizures.  Shows no evidence of any psychotic symptomatology.  At this time his depression is reasonably well controlled Wellbutrin  and Pristiq .  He will be getting radiation therapy in the next few weeks. Associated Signs/Symptoms: Depression Symptoms:   (Hypo) Manic Symptoms:   Anxiety Symptoms:   Psychotic Symptoms:   PTSD Symptoms: NA  Past Psychiatric History:   Previous Psychotropic Medications: Yes   Substance Abuse History in the last 12 months:  No.  Consequences of Substance Abuse: NA  Past Medical History:  Past Medical History:  Diagnosis Date   Anxiety    Arthritis    back, shoulders, left great toe   Candidiasis, mouth 03/22/2016   Carpal tunnel syndrome of right wrist 10/2014   Dental crowns present    Depression    Dyslipidemia    Ear fullness 08/30/2016   GERD (gastroesophageal reflux disease)    Hypertension    under control with med., per pt.; has been on med. x 15-20 yr.   Insulin dependent  diabetes mellitus    Nummular eczema    Obesity    Onychomycosis 08/30/2016   Recurrent infections 03/22/2016   Sleep apnea    uses CPAP nightly    Past Surgical History:  Procedure Laterality Date   CARDIAC CATHETERIZATION  12/30/2006   mod. pulmonary HTN with preserved cardiac output and cardiac index   CARPAL TUNNEL RELEASE Right 10/14/2014   Procedure: RIGHT CARPAL TUNNEL RELEASE;  Surgeon: Franky Curia, MD;  Location: [facility];  Service: Orthopedics;  Laterality: Right;   CARPAL TUNNEL RELEASE Left 05/19/2015   Procedure: LEFT CARPAL TUNNEL RELEASE;  Surgeon: Franky Curia, MD;  Location: [facility];  Service: Orthopedics;  Laterality: Left;   CYST EXCISION     scalp and back   ESOPHAGOGASTRODUODENOSCOPY  03/25/2007   FOOT SURGERY Left    revision traumatic amputation of foot   ROUX-EN-Y PROCEDURE  07/28/2007   with lysis of adhesions    Family Psychiatric History: Family History:  Family History  Problem Relation Age of Onset   Heart disease Mother    Cancer Mother 46       Multiple myeloma   Diabetes Father    Diabetes Brother    Heart disease Brother 57       cardiomyopathy   Heart disease Brother 59       MI   Arthritis Paternal Grandmother    Diabetes Paternal Grandmother  Heart disease Paternal Grandmother    Stroke Paternal Grandmother     Social History:   Social History   Socioeconomic History   Marital status: Single    Spouse name: Not on file   Number of children: Not on file   Years of education: Not on file   Highest education level: Not on file  Occupational History   Not on file  Tobacco Use   Smoking status: Former    Current packs/day: 0.00    Average packs/day: 1 pack/day for 33.0 years (33.0 ttl pk-yrs)    Types: Cigarettes    Start date: 30    Quit date: 07/03/2007    Years since quitting: 16.6   Smokeless tobacco: Never  Substance and Sexual Activity   Alcohol use: Not Currently   Drug use: Yes     Frequency: 7.0 times per week    Types: Marijuana    Comment: 2 bowls per day   Sexual activity: Not Currently  Other Topics Concern   Not on file  Social History Narrative   Pt lives alone    Pt works    Social Drivers of Corporate investment banker Strain: Not on file  Food Insecurity: No Food Insecurity (01/28/2024)   Hunger Vital Sign    Worried About Running Out of Food in the Last Year: Never true    Ran Out of Food in the Last Year: Never true  Transportation Needs: No Transportation Needs (01/28/2024)   PRAPARE - Administrator, Civil Service (Medical): No    Lack of Transportation (Non-Medical): No  Physical Activity: Not on file  Stress: Not on file  Social Connections: Not on file    Additional Social History:   Allergies:   Allergies  Allergen Reactions   Hydrogen Peroxide     Peeling of gums per patient    Latex Itching    Metabolic Disorder Labs: Lab Results  Component Value Date   HGBA1C 5.2 04/10/2022   No results found for: PROLACTIN Lab Results  Component Value Date   CHOL 118 03/20/2023   TRIG 138 03/20/2023   HDL 47 03/20/2023   CHOLHDL 2.5 03/20/2023   VLDL 17 11/13/2010   LDLCALC 47 03/20/2023   LDLCALC 99 09/18/2022     Therapeutic Level Labs: No results found for: LITHIUM No results found for: CBMZ No results found for: VALPROATE  Current Medications: Current Outpatient Medications  Medication Sig Dispense Refill   acetaminophen  (TYLENOL ) 500 MG tablet Take 500 mg by mouth every 6 (six) hours as needed for mild pain (pain score 1-3).     ASPIRIN  LOW DOSE 81 MG EC tablet TAKE 1 TABLET BY MOUTH AT  BEDTIME . SWALLOW WHOLE. 30 tablet 0   Azelastine  HCl 137 MCG/SPRAY SOLN Place 1 spray into both nostrils daily. 30 mL 3   B Complex-C (B-COMPLEX WITH VITAMIN C) tablet Take 1 tablet by mouth 2 (two) times daily.     buPROPion  (WELLBUTRIN  SR) 200 MG 12 hr tablet Take 1 tablet (200 mg total) by mouth 2 (two) times  daily. 180 tablet 4   calcium -vitamin D 250-100 MG-UNIT per tablet Take 1 tablet by mouth 2 (two) times daily.     celecoxib  (CELEBREX ) 100 MG capsule TAKE 1 CAPSULE BY MOUTH TWICE  DAILY 180 capsule 3   cetirizine (ZYRTEC) 10 MG tablet Take 10 mg by mouth daily.     Continuous Blood Gluc Sensor (FREESTYLE LIBRE 2 SENSOR) MISC Now on  Freestyle Libre 3     Continuous Glucose Receiver (FREESTYLE LIBRE 2 READER) DEVI Scan as needed for continuous glucose monitoring.     cyanocobalamin (VITAMIN B12) 1000 MCG tablet Take 1,000 mcg by mouth daily.     DENTA 5000 PLUS 1.1 % CREA dental cream Take by mouth.     desvenlafaxine  (PRISTIQ ) 50 MG 24 hr tablet 2 qam 180 tablet 0   empagliflozin (JARDIANCE) 25 MG TABS tablet Take 25 mg by mouth daily.     empagliflozin (JARDIANCE) 25 MG TABS tablet Take 25 mg by mouth daily.     Ferrous Sulfate  (IRON  SLOW RELEASE) 142 (45 Fe) MG TBCR Take 1 tablet by mouth daily. 30 tablet 5   fluticasone  (FLONASE ) 50 MCG/ACT nasal spray USE 1 SPRAY NASALLY ONCE A  DAY FOR ALLERGIES 32 g 0   gabapentin  (NEURONTIN ) 300 MG capsule TAKE 1 CAPSULE (300 MG TOTAL) BY MOUTH AT BEDTIME FOR 14 DAYS, THEN 1 CAPSULE (300 MG TOTAL) 2 (TWO) TIMES DAILY FOR 14 DAYS, THEN 1 CAPSULE (300 MG TOTAL) 3 (THREE) TIMES DAILY FOR 14 DAYS. 84 capsule 0   glucose blood test strip USE TO CHECK SUGAR 4 TIMES  DAILY.     hydrocortisone 2.5 % cream Apply topically.     insulin glargine (LANTUS SOLOSTAR) 100 UNIT/ML Solostar Pen Inject 8 Units into the skin daily.     Insulin Pen Needle 32G X 4 MM MISC Use to inject insulin once a day or as directed     Insulin Syringe-Needle U-100 (INSULIN SYRINGE 1CC/31GX5/16) 31G X 5/16 1 ML MISC      levocetirizine (XYZAL ) 5 MG tablet TAKE 1 TABLET BY MOUTH AT  BEDTIME 90 tablet 0   lisinopril  (ZESTRIL ) 10 MG tablet Take 1 tablet (10 mg total) by mouth daily. 90 tablet 3   metFORMIN (GLUCOPHAGE) 1000 MG tablet Take 1,000 mg by mouth 2 (two) times daily with a meal.      montelukast  (SINGULAIR ) 10 MG tablet Take 1 tablet (10 mg total) by mouth at bedtime. 90 tablet 3   Multiple Vitamin (MULTI-VITAMINS) TABS Take 1 tablet twice a day     nystatin -triamcinolone  ointment (MYCOLOG) Apply topically. (Patient not taking: Reported on 01/21/2024)     omeprazole  (PRILOSEC) 20 MG capsule Take 1 capsule by mouth daily. (Patient not taking: Reported on 01/21/2024)     ONETOUCH DELICA LANCETS 33G MISC      ONETOUCH VERIO test strip      pilocarpine (SALAGEN) 5 MG tablet Take 5 mg by mouth 3 (three) times daily.     Polyethyl Glycol-Propyl Glycol (SYSTANE) 0.4-0.3 % SOLN as directed Ophthalmic     rosuvastatin  (CRESTOR ) 20 MG tablet TAKE 1 TABLET BY MOUTH DAILY 90 tablet 2   tadalafil  (CIALIS ) 20 MG tablet Take 1 tablet (20 mg total) by mouth daily as needed for erectile dysfunction. 30 tablet 1   tirzepatide (MOUNJARO) 2.5 MG/0.5ML Pen Inject 2.5 mg into the skin once a week. (Patient not taking: Reported on 01/21/2024)     traZODone  (DESYREL ) 100 MG tablet Take 100 mg by mouth at bedtime.     No current facility-administered medications for this visit.    Musculoskeletal: Strength & Muscle Tone: within normal limits Gait & Station: normal Patient leans: Left  Psychiatric Specialty Exam: Review of Systems  Blood pressure 125/79, pulse 78, height 5' 11 (1.803 m), weight 208 lb (94.3 kg), SpO2 100%.Body mass index is 29.01 kg/m.  General Appearance: Casual  Eye Contact:  Good  Speech:  NA  Volume:  Normal  Mood:  Dysphoric  Affect:  NA  Thought Process:  Coherent  Orientation:    Thought Content:  Logical  Suicidal Thoughts:  No  Homicidal Thoughts:  No  Memory:  NA  Judgement:  Good  Insight:    Psychomotor Activity:  NA  Concentration:    Recall:  Good  Fund of Knowledge:Good  Language: Good  Akathisia:    Handed:  Right  AIMS (if indicated):  not done  Assets:  Desire for Improvement  ADL's:  Intact  Cognition: WNL  Sleep:  Good    Screenings: GAD-7    Flowsheet Row Counselor from 11/28/2022 in [facility]  Total GAD-7 Score 6   PHQ2-9    Flowsheet Row CONSULT from 01/28/2024 in [facility] Counselor from 11/28/2022 in [facility] Visit from 09/18/2022 in [facility] Visit from 06/15/2022 in [facility] Visit from 11/07/2020 in [facility]  PHQ-2 Total Score 3 2 3 6 6   PHQ-9 Total Score -- 10 4 14 13     Assessment and Plan:    At this time the patient's diagnosis is major depression in remission.  He will continue taking Wellbutrin  and Pristiq .  He recently been diagnosed with a meningioma and is going to begin on radiation therapy.  He will return to see me in the next 2 to 3 months.  Emotionally I think he is stable.  I have asked the patient to get back into psychotherapy with his therapist here. Collaboration of Care:   Patient/Guardian was advised Release of Information must be obtained prior to any record release in order to collaborate their care with an outside provider. Patient/Guardian was advised if they have not already done so to contact the registration department to sign all necessary forms in order for us  to release information regarding their care.   Consent: Patient/Guardian gives verbal consent for treatment and assignment of benefits for services provided during this visit. Patient/Guardian expressed understanding and agreed to proceed.   Elna LILLETTE Lo, MD 8/5/20252:47 PM

## 2024-02-04 NOTE — Progress Notes (Incomplete)
 Has armband been applied?  {yes no:314532}  Does patient have an allergy to IV contrast dye?: {yes no:314532}   Has patient ever received premedication for IV contrast dye?: {yes no:314532}   Date of lab work: 01/28/2024 BUN: 19 CR: 0.80 eGFR: >60  Does patient take metformin?: {yes no:314532}  Is eGFR >60?: {yes no:314532} If no, when can patient resume? (Must be 48 hrs AFTER they receive IV contrast):  {Time; dates multiple:15870}  IV site: {iv locations:314275}  Has IV site been added to flowsheet?  {yes no:314532}  There were no vitals taken for this visit.

## 2024-02-05 ENCOUNTER — Ambulatory Visit
Admission: RE | Admit: 2024-02-05 | Discharge: 2024-02-05 | Disposition: A | Discharge status: Home / Self Care | Source: Ambulatory Visit | Attending: Radiation Oncology | Admitting: Radiation Oncology

## 2024-02-05 DIAGNOSIS — D329 Benign neoplasm of meninges, unspecified: Secondary | ICD-10-CM

## 2024-02-05 MED ORDER — GADOPICLENOL 0.5 MMOL/ML IV SOLN
10.0000 mL | Freq: Once | INTRAVENOUS | Status: AC | PRN
  Administered 2024-02-05: 10 mL via INTRAVENOUS

## 2024-02-06 ENCOUNTER — Other Ambulatory Visit: Payer: Self-pay | Admitting: Radiation Therapy

## 2024-02-10 ENCOUNTER — Ambulatory Visit

## 2024-02-10 ENCOUNTER — Ambulatory Visit: Admitting: Radiation Oncology

## 2024-02-10 ENCOUNTER — Encounter: Attending: Radiation Oncology

## 2024-02-10 ENCOUNTER — Encounter: Payer: Self-pay | Admitting: Radiation Oncology

## 2024-02-10 NOTE — Progress Notes (Signed)
 Patient's recent brain MRI was reviewed today at [facility] tumor board. Consensus is that significant growth over 3 mo is not consistent with Grade I meningioma. Biopsy recommended.  I let Mr. Santellan know not to come in for [facility] planning today. He is aware of appt tomorrow w/ Dr Rosslyn to discuss biopsy. -----------------------------------  Lauraine Golden, MD

## 2024-02-11 ENCOUNTER — Encounter: Payer: Self-pay | Admitting: Neurosurgery

## 2024-02-11 ENCOUNTER — Ambulatory Visit: Admitting: Neurosurgery

## 2024-02-11 VITALS — BP 111/68 | HR 71 | Ht 71.0 in | Wt 216.0 lb

## 2024-02-11 DIAGNOSIS — D329 Benign neoplasm of meninges, unspecified: Secondary | ICD-10-CM

## 2024-02-11 DIAGNOSIS — D496 Neoplasm of unspecified behavior of brain: Secondary | ICD-10-CM | POA: Diagnosis not present

## 2024-02-11 DIAGNOSIS — G500 Trigeminal neuralgia: Secondary | ICD-10-CM | POA: Diagnosis not present

## 2024-02-11 DIAGNOSIS — G509 Disorder of trigeminal nerve, unspecified: Secondary | ICD-10-CM

## 2024-02-11 NOTE — Progress Notes (Signed)
 68 year old woman with a short history of right-sided facial numbness and trigeminal neuralgia.  He was found to have a mass in the Meckel's cave extending into the cavernous sinus as well as the posterior fossa.  When I saw him, I discussed the options of short period of steroid treatment versus surgical resection/biopsy versus radiation and he opted for the latter.  He recently saw Dr. Izell, radiation oncologist, who repeated the MRI and to our surprise, the MRI showed that the tumor had grown.  He was referred back to us  after we discussed his case in our tumor board and recommendation was made to get a tissue biopsy.  I discussed the conversation with him and told him that this is atypical behavior for a meningioma or a schwannoma and a tissue diagnosis would help us  with what has to happen and he is in agreement to proceed with a resection/biopsy.  I went over the surgery with him in great detail in layman's terms: I explained to him where the surgery would be and explained to him that I would try to remove as much as possible of the tumor around the brainstem and the posterior fossa.  I told him that my goal is not to remove the entire tumor as I believe that what ever this is, can also be treated with stereotactic radiosurgery.  I specifically told him about the surgery that could result in permanent numbness in his face and talk to him about the risk of anesthesia dolorosa [I explained to him in layman's terms what this entails] as well as corneal anesthesia which can result in damage to the eye and can even result in permanent closure of the eye.  He told me that he is currently taking eyedrops in his right eye since he started having the facial pain and numbness because his eye feels different.  On examination today, he does have less sensation in his right cornea than in the left one with repeat testing confirming this subjective feeling.  However, when I touch his face, he does not endorse any  difference in facial sensation.  I told him that he would likely need steroids for about a week and he is okay with that.  We talked about the perioperative phase and I told him that he will be able to go home the next day and that he will have to have some friends or family around him and he told me that he has friends with whom he can stay for a couple days.  I answered all his questions to his satisfaction and I will schedule him for a right-sided posterior fossa craniotomy for tumor.

## 2024-02-11 NOTE — Patient Instructions (Signed)
 Please see below for information in regards to your upcoming surgery:   Planned surgery: Right Craniotomy for Tumor resection.    Surgery date: 02/24/2024 at [facility] Entrance A -  [address] [place], [place] [address]- you will find out your arrival time at your PAT Visit.     Pre-op appointment at [facility] Pre-admit Testing: you will receive a call with a date/time for this appointment. If you are scheduled for an in person appointment, Pre-admit Testing is located at Entrance A. You will enter and check in at patient registration. During this appointment, they will advise you which medications you can take the morning of surgery, and which medications you will need to hold for surgery. Labs (such as blood work, EKG) may be done at your pre-op appointment. You are not required to fast for these labs. Should you need to change your pre-op appointment, please call Pre-admit testing at  [phone].  Blood thinners:   Aspirin  81mg :   if taking as a preventative, stop aspirin  7 days prior, resume aspirin  14 days after   Diabetes/weight loss medications: Per anesthesia guidelines (due to the increased risk of aspiration caused by delayed gastric emptying):  Metformin: hold for 2 days prior to surgery  SGLT2 inhibitors should be held for 3 days prior to surgery and include: Empagliflozin (Jardiance)   Common restrictions after surgery: Please avoid lifting weight > 5lbs. You should not drive until you are cleared to do so. Do not wash your hair until your staples/ stitches are removed.   How to contact us :  If you have any questions/concerns before or after surgery, you can reach us  at [phone], or you can send a mychart message. We can be reached by phone or mychart 8am-5pm, Monday-Friday.   If you have FMLA/disability paperwork, please drop it off or fax to [phone]   Registered Nurse/Surgery scheduler:  Tinnie HERO RN-BSN Medical Assistants: Jesslyn DEL CMA Doctors: Dino Sable MD, Nancyann Burns MD

## 2024-02-13 ENCOUNTER — Other Ambulatory Visit: Payer: Self-pay | Admitting: Radiation Therapy

## 2024-02-13 ENCOUNTER — Telehealth: Payer: Self-pay

## 2024-02-13 ENCOUNTER — Other Ambulatory Visit: Payer: Self-pay

## 2024-02-13 DIAGNOSIS — Z01818 Encounter for other preprocedural examination: Secondary | ICD-10-CM

## 2024-02-13 NOTE — Addendum Note (Signed)
 Addended by: Candace Begue on: 02/13/2024 11:24 AM   Modules accepted: Orders

## 2024-02-13 NOTE — Telephone Encounter (Signed)
 Surgery Scheduling

## 2024-02-18 ENCOUNTER — Telehealth: Payer: Self-pay | Admitting: Neurosurgery

## 2024-02-18 NOTE — Telephone Encounter (Signed)
 Patient called and said he needs a new script for gabapentin  (NEURONTIN ) 300 MG capsule sent in with new way he's taking, 1 in the morning, 1 before dinner and 2 before bed, for 1200mg  total. Pharmacy is [id number] - [place],  - [address]

## 2024-02-18 NOTE — Pre-Procedure Instructions (Signed)
 Surgical Instructions   Your procedure is scheduled on February 24, 2024. Report to [facility] Main Entrance A at 5:30 A.M., then check in with the Admitting office. Any questions or running late day of surgery: call [phone]  Questions prior to your surgery date: call [phone], Monday-Friday, 8am-4pm. If you experience any cold or flu symptoms such as cough, fever, chills, shortness of breath, etc. between now and your scheduled surgery, please notify us  at the above number.     Remember:  Do not eat or drink after midnight the night before your surgery    Take these medicines the morning of surgery with A SIP OF WATER: Azelastine  HCl Nasal Spray buPROPion  (WELLBUTRIN  SR)  cetirizine (ZYRTEC)  desvenlafaxine  (PRISTIQ )  gabapentin  (NEURONTIN )  pilocarpine (SALAGEN)    May take these medicines IF NEEDED: acetaminophen  (TYLENOL )  Polyethyl Glycol-Propyl Glycol (SYSTANE) eye drops   STOP taking your Aspirin  seven days prior to surgery. Your last dose will be August 17th.   One week prior to surgery, STOP taking any Aleve, Naproxen, Ibuprofen, Motrin, Advil, Goody's, BC's, all herbal medications, fish oil, and non-prescription vitamins. This includes your medication: celecoxib  (CELEBREX )    WHAT DO I DO ABOUT MY DIABETES MEDICATION?   Do not take metFORMIN (GLUCOPHAGE) the day before surgery or the morning of surgery.      THE MORNING OF SURGERY, take 6 units of insulin glargine (LANTUS SOLOSTAR).  STOP taking your empagliflozin (JARDIANCE) three days prior to surgery. Your last dose will be August 21st.    HOW TO [facility] YOUR DIABETES BEFORE AND AFTER SURGERY  Why is it important to control my blood sugar before and after surgery? Improving blood sugar levels before and after surgery helps healing and can limit problems. A way of improving blood sugar control is eating a healthy diet by:  Eating less sugar and carbohydrates  Increasing activity/exercise   Talking with your doctor about reaching your blood sugar goals High blood sugars (greater than 180 mg/dL) can raise your risk of infections and slow your recovery, so you will need to focus on controlling your diabetes during the weeks before surgery. Make sure that the doctor who takes care of your diabetes knows about your planned surgery including the date and location.  How do I manage my blood sugar before surgery? Check your blood sugar at least 4 times a day, starting 2 days before surgery, to make sure that the level is not too high or low.  Check your blood sugar the morning of your surgery when you wake up and every 2 hours until you get to the Short Stay unit.  If your blood sugar is less than 70 mg/dL, you will need to treat for low blood sugar: Do not take insulin. Treat a low blood sugar (less than 70 mg/dL) with  cup of clear juice (cranberry or apple), 4 glucose tablets, OR glucose gel. Recheck blood sugar in 15 minutes after treatment (to make sure it is greater than 70 mg/dL). If your blood sugar is not greater than 70 mg/dL on recheck, call [phone] for further instructions. Report your blood sugar to the short stay nurse when you get to Short Stay.  If you are admitted to the hospital after surgery: Your blood sugar will be checked by the staff and you will probably be given insulin after surgery (instead of oral diabetes medicines) to make sure you have good blood sugar levels. The goal for blood sugar control after surgery is 80-180 mg/dL.  Do NOT Smoke (Tobacco/Vaping) for 24 hours prior to your procedure.  If you use a CPAP at night, you may bring your mask/headgear for your overnight stay.   You will be asked to remove any contacts, glasses, piercing's, hearing aid's, dentures/partials prior to surgery. Please bring cases for these items if needed.    Patients discharged the day of surgery will not be allowed to drive home, and someone  needs to stay with them for 24 hours.  SURGICAL WAITING ROOM VISITATION Patients may have no more than 2 support people in the waiting area - these visitors may rotate.   Pre-op nurse will coordinate an appropriate time for 1 ADULT support person, who may not rotate, to accompany patient in pre-op.  Children under the age of 19 must have an adult with them who is not the patient and must remain in the main waiting area with an adult.  If the patient needs to stay at the hospital during part of their recovery, the visitor guidelines for inpatient rooms apply.  Please refer to the [facility] website for the visitor guidelines for any additional information.   If you received a COVID test during your pre-op visit  it is requested that you wear a mask when out in public, stay away from anyone that may not be feeling well and notify your surgeon if you develop symptoms. If you have been in contact with anyone that has tested positive in the last 10 days please notify you surgeon.      Pre-operative CHG Bathing Instructions   You can play a key role in reducing the risk of infection after surgery. Your skin needs to be as free of germs as possible. You can reduce the number of germs on your skin by washing with CHG (chlorhexidine  gluconate) soap before surgery. CHG is an antiseptic soap that kills germs and continues to kill germs even after washing.   DO NOT use if you have an allergy to chlorhexidine /CHG or antibacterial soaps. If your skin becomes reddened or irritated, stop using the CHG and notify one of our RNs at [phone].              TAKE A SHOWER THE NIGHT BEFORE SURGERY AND THE DAY OF SURGERY    Please keep in mind the following:  DO NOT shave, including legs and underarms, 48 hours prior to surgery.   You may shave your face before/day of surgery.  Place clean sheets on your bed the night before surgery Use a clean washcloth (not used since being washed) for each shower. DO  NOT sleep with pet's night before surgery.  CHG Shower Instructions:  Wash your face and private area with normal soap. If you choose to wash your hair, wash first with your normal shampoo.  After you use shampoo/soap, rinse your hair and body thoroughly to remove shampoo/soap residue.  Turn the water OFF and apply half the bottle of CHG soap to a CLEAN washcloth.  Apply CHG soap ONLY FROM YOUR NECK DOWN TO YOUR TOES (washing for 3-5 minutes)  DO NOT use CHG soap on face, private areas, open wounds, or sores.  Pay special attention to the area where your surgery is being performed.  If you are having back surgery, having someone wash your back for you may be helpful. Wait 2 minutes after CHG soap is applied, then you may rinse off the CHG soap.  Pat dry with a clean towel  Put on clean pajamas  Additional instructions for the day of surgery: DO NOT APPLY any lotions, deodorants, cologne, or perfumes.   Do not wear jewelry or makeup Do not wear nail polish, gel polish, artificial nails, or any other type of covering on natural nails (fingers and toes) Do not bring valuables to the hospital. [facility] is not responsible for valuables/personal belongings. Put on clean/comfortable clothes.  Please brush your teeth.  Ask your nurse before applying any prescription medications to the skin.

## 2024-02-19 ENCOUNTER — Encounter (HOSPITAL_COMMUNITY): Payer: Self-pay

## 2024-02-19 ENCOUNTER — Other Ambulatory Visit: Payer: Self-pay

## 2024-02-19 ENCOUNTER — Ambulatory Visit: Admitting: Radiation Oncology

## 2024-02-19 ENCOUNTER — Encounter (HOSPITAL_COMMUNITY)
Admission: RE | Admit: 2024-02-19 | Discharge: 2024-02-19 | Disposition: A | Discharge status: Home / Self Care | Source: Ambulatory Visit | Attending: Neurosurgery | Admitting: Neurosurgery

## 2024-02-19 VITALS — BP 127/69 | HR 65 | Temp 98.1°F | Resp 18 | Ht 71.0 in | Wt 217.6 lb

## 2024-02-19 DIAGNOSIS — G509 Disorder of trigeminal nerve, unspecified: Secondary | ICD-10-CM

## 2024-02-19 DIAGNOSIS — E119 Type 2 diabetes mellitus without complications: Secondary | ICD-10-CM | POA: Insufficient documentation

## 2024-02-19 DIAGNOSIS — Z01818 Encounter for other preprocedural examination: Secondary | ICD-10-CM | POA: Insufficient documentation

## 2024-02-19 DIAGNOSIS — D329 Benign neoplasm of meninges, unspecified: Secondary | ICD-10-CM

## 2024-02-19 DIAGNOSIS — Z01812 Encounter for preprocedural laboratory examination: Secondary | ICD-10-CM | POA: Diagnosis present

## 2024-02-19 DIAGNOSIS — R9431 Abnormal electrocardiogram [ECG] [EKG]: Secondary | ICD-10-CM | POA: Insufficient documentation

## 2024-02-19 DIAGNOSIS — Z01810 Encounter for preprocedural cardiovascular examination: Secondary | ICD-10-CM | POA: Diagnosis present

## 2024-02-19 HISTORY — DX: Headache, unspecified: R51.9

## 2024-02-19 LAB — CBC
HCT: 44.3 % (ref 39.0–52.0)
Hemoglobin: 14.8 g/dL (ref 13.0–17.0)
MCH: 31.7 pg (ref 26.0–34.0)
MCHC: 33.4 g/dL (ref 30.0–36.0)
MCV: 94.9 fL (ref 80.0–100.0)
Platelets: 235 K/uL (ref 150–400)
RBC: 4.67 MIL/uL (ref 4.22–5.81)
RDW: 13.6 % (ref 11.5–15.5)
WBC: 8.3 K/uL (ref 4.0–10.5)
nRBC: 0 % (ref 0.0–0.2)

## 2024-02-19 LAB — BASIC METABOLIC PANEL WITH GFR
Anion gap: 11 (ref 5–15)
BUN: 14 mg/dL (ref 8–23)
CO2: 25 mmol/L (ref 22–32)
Calcium: 9.2 mg/dL (ref 8.9–10.3)
Chloride: 104 mmol/L (ref 98–111)
Creatinine, Ser: 0.79 mg/dL (ref 0.61–1.24)
GFR, Estimated: >60 mL/min (ref >60)
Glucose, Bld: 120 mg/dL — ABNORMAL HIGH (ref 70–99)
Potassium: 4.3 mmol/L (ref 3.5–5.1)
Sodium: 140 mmol/L (ref 135–145)

## 2024-02-19 LAB — GLUCOSE, CAPILLARY: Glucose-Capillary: 190 mg/dL — ABNORMAL HIGH (ref 70–99)

## 2024-02-19 LAB — TYPE AND SCREEN
ABO/RH(D): O POS
Antibody Screen: NEGATIVE

## 2024-02-19 LAB — HEMOGLOBIN A1C
Hgb A1c MFr Bld: 6.5 % — ABNORMAL HIGH (ref 4.8–5.6)
Mean Plasma Glucose: 139.85 mg/dL

## 2024-02-19 MED ORDER — GABAPENTIN 300 MG PO CAPS: ORAL_CAPSULE | ORAL | 0 refills | Status: DC

## 2024-02-19 NOTE — Progress Notes (Signed)
 PCP - Dr. Norleen Jobs Cardiologist - Denies Endocrinologist - Timothy Glendia Hummer, MD - last office visit 12/09/2023  PPM/ICD - Denies Device Orders - n/a Rep Notified - n/a  Chest x-ray - n/a EKG - 02/19/2024 Stress Test - Per pt, 20+ years ago. Result normal ECHO - Denies Cardiac Cath - Denies  Sleep Study - +OSA. Pt wears oral device nightly.  Pt is DM2. He checks his blood sugar 2x/day. Normal fasting glucose 140s. CBG at pre-op appointment 190 (pt just had lunch). A1c result pending. Pt instructed to hold Jardiance 3 days prior to surgery, last dose will be August 21st. Per surgeon, pt was instructed to hold Metformin 2 days prior to surgery, last dose will be August 23rd.  Last dose of GLP1 agonist- n/a GLP1 instructions: n/a  Blood Thinner Instructions: n/a Aspirin  Instructions: Pt instructed to hold ASA one week prior to surgery. His last dose was August 17th.  NPO after midnight  COVID TEST- n/a   Anesthesia review: Yes. EKG review. Hx of HTN, DM and OSA.   Patient denies shortness of breath, fever, cough and chest pain at PAT appointment. Pt denies any respiratory illness/infection in the last two months.    All instructions explained to the patient, with a verbal understanding of the material. Patient agrees to go over the instructions while at home for a better understanding. Patient also instructed to self quarantine after being tested for COVID-19. The opportunity to ask questions was provided.

## 2024-02-20 ENCOUNTER — Telehealth: Payer: Self-pay | Admitting: Neurosurgery

## 2024-02-20 NOTE — Telephone Encounter (Signed)
 Patient called the office this morning and left a message on the voice mail.  He is requesting a call back to discuss some questions that he has about his surgery that is scheduled for Monday 02/24/24.    Patient would like to know when he can return to work.  Patient would also like to discuss his FMLA paperwork.  Please contact him at [phone].

## 2024-02-20 NOTE — Telephone Encounter (Signed)
 Patient asked if we had completed his FMLA paperwork- I told him that I had not seen any paperwork for him. I gave him the fax number to pass on to his employeer.   I advised him that he will be out of work for at least 3 weeks.   He will get his employer to get us  the FMLA paper work so this can be completed.

## 2024-02-21 ENCOUNTER — Ambulatory Visit: Admitting: Radiation Oncology

## 2024-02-23 ENCOUNTER — Encounter (HOSPITAL_COMMUNITY): Payer: Self-pay | Admitting: Neurosurgery

## 2024-02-23 NOTE — Anesthesia Preprocedure Evaluation (Signed)
 Anesthesia Evaluation  Patient identified by MRN, date of birth, ID band Patient awake    Reviewed: Allergy & Precautions, NPO status , Patient's Chart, lab work & pertinent test results, reviewed documented beta blocker date and time   Airway Mallampati: II  TM Distance: >3 FB     Dental no notable dental hx. (+) Dental Advisory Given, Caps, Teeth Intact   Pulmonary sleep apnea and Continuous Positive Airway Pressure Ventilation , former smoker   Pulmonary exam normal breath sounds clear to auscultation       Cardiovascular hypertension, Pt. on medications Normal cardiovascular exam Rhythm:Regular Rate:Bradycardia  EKG 02/19/24 Sinus bradycardia Nonspecific T wave abnormality   Neuro/Psych  Headaches PSYCHIATRIC DISORDERS Anxiety Depression    Meningioma- intracranial Trigeminal neuralgia  Neuromuscular disease    GI/Hepatic Neg liver ROS,GERD  Medicated,,  Endo/Other  diabetes, Well Controlled, Type 2, Insulin  Dependent, Oral Hypoglycemic Agents    Renal/GU negative Renal ROS  negative genitourinary   Musculoskeletal  (+) Arthritis ,    Abdominal  (+) + obese  Peds  Hematology negative hematology ROS (+)   Anesthesia Other Findings   Reproductive/Obstetrics ED                              Anesthesia Physical Anesthesia Plan  ASA: 3  Anesthesia Plan: General   Post-op Pain Management: Ofirmev  IV (intra-op)*   Induction: Intravenous  PONV Risk Score and Plan: 3 and Treatment may vary due to age or medical condition and Ondansetron   Airway Management Planned: Oral ETT  Additional Equipment: Arterial line  Intra-op Plan:   Post-operative Plan: Extubation in OR  Informed Consent: I have reviewed the patients History and Physical, chart, labs and discussed the procedure including the risks, benefits and alternatives for the proposed anesthesia with the patient or authorized  representative who has indicated his/her understanding and acceptance.     Dental advisory given  Plan Discussed with: Anesthesiologist and CRNA  Anesthesia Plan Comments: (Remifent drip, N20 Neuromonitoring)         Anesthesia Quick Evaluation

## 2024-02-24 ENCOUNTER — Encounter (HOSPITAL_COMMUNITY)
Admission: RE | Disposition: A | Discharge status: Home / Self Care | Payer: Self-pay | Source: Home / Self Care | Attending: Neurosurgery

## 2024-02-24 ENCOUNTER — Ambulatory Visit: Admitting: Radiation Oncology

## 2024-02-24 ENCOUNTER — Other Ambulatory Visit: Payer: Self-pay

## 2024-02-24 ENCOUNTER — Encounter (HOSPITAL_COMMUNITY): Payer: Self-pay | Admitting: Neurosurgery

## 2024-02-24 ENCOUNTER — Inpatient Hospital Stay (HOSPITAL_COMMUNITY): Admitting: Certified Registered Nurse Anesthetist

## 2024-02-24 ENCOUNTER — Inpatient Hospital Stay (HOSPITAL_COMMUNITY)
Admission: RE | Admit: 2024-02-24 | Discharge: 2024-02-26 | DRG: 027 | Disposition: A | Discharge status: Home / Self Care | Attending: Internal Medicine | Admitting: Internal Medicine

## 2024-02-24 ENCOUNTER — Ambulatory Visit

## 2024-02-24 ENCOUNTER — Inpatient Hospital Stay (HOSPITAL_COMMUNITY): Payer: Self-pay | Admitting: Physician Assistant

## 2024-02-24 DIAGNOSIS — E785 Hyperlipidemia, unspecified: Secondary | ICD-10-CM | POA: Diagnosis present

## 2024-02-24 DIAGNOSIS — Z7984 Long term (current) use of oral hypoglycemic drugs: Secondary | ICD-10-CM | POA: Diagnosis not present

## 2024-02-24 DIAGNOSIS — Z01818 Encounter for other preprocedural examination: Secondary | ICD-10-CM

## 2024-02-24 DIAGNOSIS — E119 Type 2 diabetes mellitus without complications: Secondary | ICD-10-CM | POA: Diagnosis present

## 2024-02-24 DIAGNOSIS — Z7982 Long term (current) use of aspirin: Secondary | ICD-10-CM

## 2024-02-24 DIAGNOSIS — Z6830 Body mass index (BMI) 30.0-30.9, adult: Secondary | ICD-10-CM

## 2024-02-24 DIAGNOSIS — D333 Benign neoplasm of cranial nerves: Principal | ICD-10-CM | POA: Diagnosis present

## 2024-02-24 DIAGNOSIS — Z91040 Latex allergy status: Secondary | ICD-10-CM

## 2024-02-24 DIAGNOSIS — Z8249 Family history of ischemic heart disease and other diseases of the circulatory system: Secondary | ICD-10-CM | POA: Diagnosis not present

## 2024-02-24 DIAGNOSIS — G4733 Obstructive sleep apnea (adult) (pediatric): Secondary | ICD-10-CM | POA: Diagnosis present

## 2024-02-24 DIAGNOSIS — Z8261 Family history of arthritis: Secondary | ICD-10-CM

## 2024-02-24 DIAGNOSIS — H5500 Unspecified nystagmus: Secondary | ICD-10-CM | POA: Diagnosis present

## 2024-02-24 DIAGNOSIS — G500 Trigeminal neuralgia: Secondary | ICD-10-CM | POA: Diagnosis present

## 2024-02-24 DIAGNOSIS — Z87891 Personal history of nicotine dependence: Secondary | ICD-10-CM

## 2024-02-24 DIAGNOSIS — I10 Essential (primary) hypertension: Secondary | ICD-10-CM | POA: Diagnosis present

## 2024-02-24 DIAGNOSIS — D329 Benign neoplasm of meninges, unspecified: Secondary | ICD-10-CM | POA: Diagnosis present

## 2024-02-24 DIAGNOSIS — F418 Other specified anxiety disorders: Secondary | ICD-10-CM | POA: Diagnosis not present

## 2024-02-24 DIAGNOSIS — E669 Obesity, unspecified: Secondary | ICD-10-CM | POA: Diagnosis present

## 2024-02-24 DIAGNOSIS — G509 Disorder of trigeminal nerve, unspecified: Secondary | ICD-10-CM | POA: Diagnosis present

## 2024-02-24 DIAGNOSIS — D496 Neoplasm of unspecified behavior of brain: Secondary | ICD-10-CM | POA: Diagnosis present

## 2024-02-24 DIAGNOSIS — Z823 Family history of stroke: Secondary | ICD-10-CM | POA: Diagnosis not present

## 2024-02-24 DIAGNOSIS — Z794 Long term (current) use of insulin: Secondary | ICD-10-CM | POA: Diagnosis not present

## 2024-02-24 DIAGNOSIS — Z79899 Other long term (current) drug therapy: Secondary | ICD-10-CM | POA: Diagnosis not present

## 2024-02-24 DIAGNOSIS — F32A Depression, unspecified: Secondary | ICD-10-CM | POA: Diagnosis not present

## 2024-02-24 DIAGNOSIS — Z807 Family history of other malignant neoplasms of lymphoid, hematopoietic and related tissues: Secondary | ICD-10-CM

## 2024-02-24 DIAGNOSIS — K219 Gastro-esophageal reflux disease without esophagitis: Secondary | ICD-10-CM | POA: Diagnosis present

## 2024-02-24 DIAGNOSIS — Z833 Family history of diabetes mellitus: Secondary | ICD-10-CM

## 2024-02-24 DIAGNOSIS — F329 Major depressive disorder, single episode, unspecified: Secondary | ICD-10-CM | POA: Diagnosis present

## 2024-02-24 HISTORY — PX: CRANIOTOMY: SHX93

## 2024-02-24 LAB — GLUCOSE, CAPILLARY
Glucose-Capillary: 115 mg/dL — ABNORMAL HIGH (ref 70–99)
Glucose-Capillary: 139 mg/dL — ABNORMAL HIGH (ref 70–99)
Glucose-Capillary: 155 mg/dL — ABNORMAL HIGH (ref 70–99)
Glucose-Capillary: 186 mg/dL — ABNORMAL HIGH (ref 70–99)
Glucose-Capillary: 192 mg/dL — ABNORMAL HIGH (ref 70–99)
Glucose-Capillary: 193 mg/dL — ABNORMAL HIGH (ref 70–99)
Glucose-Capillary: 210 mg/dL — ABNORMAL HIGH (ref 70–99)

## 2024-02-24 LAB — ABO/RH: ABO/RH(D): O POS

## 2024-02-24 SURGERY — CRANIOTOMY TUMOR EXCISION
Anesthesia: General | Laterality: Right

## 2024-02-24 MED ORDER — LIDOCAINE-EPINEPHRINE 1 %-1:100000 IJ SOLN
INTRAMUSCULAR | Status: DC | PRN
  Administered 2024-02-24: 8 mL

## 2024-02-24 MED ORDER — SURGIFLO WITH THROMBIN (HEMOSTATIC MATRIX KIT) OPTIME
TOPICAL | Status: DC | PRN
  Administered 2024-02-24: 1 via TOPICAL

## 2024-02-24 MED ORDER — AMISULPRIDE (ANTIEMETIC) 5 MG/2ML IV SOLN
INTRAVENOUS | Status: AC
  Filled 2024-02-24: qty 4

## 2024-02-24 MED ORDER — HEMOSTATIC AGENTS (NO CHARGE) OPTIME
TOPICAL | Status: DC | PRN
  Administered 2024-02-24 (×2): 1 via TOPICAL

## 2024-02-24 MED ORDER — LORATADINE 10 MG PO TABS
10.0000 mg | ORAL_TABLET | Freq: Every day | ORAL | Status: DC
  Administered 2024-02-24 – 2024-02-26 (×3): 10 mg via ORAL
  Filled 2024-02-24 (×3): qty 1

## 2024-02-24 MED ORDER — ESMOLOL HCL 100 MG/10ML IV SOLN
INTRAVENOUS | Status: AC
  Filled 2024-02-24: qty 10

## 2024-02-24 MED ORDER — METOCLOPRAMIDE HCL 5 MG/ML IJ SOLN
INTRAMUSCULAR | Status: DC | PRN
  Administered 2024-02-24: 10 mg via INTRAVENOUS

## 2024-02-24 MED ORDER — CEFAZOLIN IN SODIUM CHLORIDE 2-0.9 GM/100ML-% IV SOLN
2.0000 g | Freq: Once | INTRAVENOUS | Status: AC
  Administered 2024-02-24: 2 g via INTRAVENOUS
  Filled 2024-02-24: qty 100

## 2024-02-24 MED ORDER — LIDOCAINE 2% (20 MG/ML) 5 ML SYRINGE
INTRAMUSCULAR | Status: DC | PRN
  Administered 2024-02-24: 60 mg via INTRAVENOUS
  Administered 2024-02-24: 50 mg via INTRAVENOUS

## 2024-02-24 MED ORDER — INSULIN ASPART 100 UNIT/ML IJ SOLN
0.0000 [IU] | INTRAMUSCULAR | Status: DC | PRN
  Administered 2024-02-24: 2 [IU] via SUBCUTANEOUS
  Filled 2024-02-24: qty 1

## 2024-02-24 MED ORDER — PROMETHAZINE HCL 25 MG PO TABS
12.5000 mg | ORAL_TABLET | ORAL | Status: DC | PRN
  Administered 2024-02-24: 25 mg via ORAL
  Filled 2024-02-24: qty 1

## 2024-02-24 MED ORDER — PROPOFOL 10 MG/ML IV BOLUS
INTRAVENOUS | Status: AC
  Filled 2024-02-24: qty 20

## 2024-02-24 MED ORDER — BUTALBITAL-APAP-CAFFEINE 50-325-40 MG PO TABS
2.0000 | ORAL_TABLET | ORAL | Status: DC | PRN
  Administered 2024-02-24 – 2024-02-26 (×6): 2 via ORAL
  Filled 2024-02-24 (×6): qty 2

## 2024-02-24 MED ORDER — 0.9 % SODIUM CHLORIDE (POUR BTL) OPTIME
TOPICAL | Status: DC | PRN
  Administered 2024-02-24: 3000 mL

## 2024-02-24 MED ORDER — ACETAMINOPHEN 325 MG PO TABS
650.0000 mg | ORAL_TABLET | ORAL | Status: DC | PRN
Start: 2024-02-24 — End: 2024-02-26

## 2024-02-24 MED ORDER — LIDOCAINE-EPINEPHRINE 1 %-1:100000 IJ SOLN
INTRAMUSCULAR | Status: AC
  Filled 2024-02-24: qty 1

## 2024-02-24 MED ORDER — SUCCINYLCHOLINE CHLORIDE 200 MG/10ML IV SOSY
PREFILLED_SYRINGE | INTRAVENOUS | Status: DC | PRN
  Administered 2024-02-24: 120 mg via INTRAVENOUS

## 2024-02-24 MED ORDER — CLEVIDIPINE BUTYRATE 0.5 MG/ML IV EMUL
INTRAVENOUS | Status: DC | PRN
  Administered 2024-02-24: .5 mg/h via INTRAVENOUS

## 2024-02-24 MED ORDER — CHLORHEXIDINE GLUCONATE CLOTH 2 % EX PADS
6.0000 | MEDICATED_PAD | Freq: Every day | CUTANEOUS | Status: DC
  Administered 2024-02-24 – 2024-02-26 (×3): 6 via TOPICAL

## 2024-02-24 MED ORDER — TRAZODONE HCL 50 MG PO TABS: 100.0000 mg | ORAL_TABLET | Freq: Every evening | ORAL | Status: DC | PRN

## 2024-02-24 MED ORDER — VANCOMYCIN HCL 1000 MG IV SOLR
INTRAVENOUS | Status: AC
Start: 2024-02-24 — End: 2024-02-24
  Filled 2024-02-24: qty 20

## 2024-02-24 MED ORDER — AMISULPRIDE (ANTIEMETIC) 5 MG/2ML IV SOLN
10.0000 mg | Freq: Once | INTRAVENOUS | Status: AC | PRN
  Administered 2024-02-24: 10 mg via INTRAVENOUS

## 2024-02-24 MED ORDER — FENTANYL CITRATE PF 50 MCG/ML IJ SOSY
25.0000 ug | PREFILLED_SYRINGE | INTRAMUSCULAR | Status: AC | PRN
  Administered 2024-02-24 – 2024-02-25 (×3): 25 ug via INTRAVENOUS
  Filled 2024-02-24 (×3): qty 1

## 2024-02-24 MED ORDER — OXYCODONE HCL 5 MG/5ML PO SOLN: 5.0000 mg | Freq: Once | ORAL | Status: DC | PRN

## 2024-02-24 MED ORDER — OXYCODONE HCL 5 MG PO TABS: 5.0000 mg | ORAL_TABLET | Freq: Once | ORAL | Status: DC | PRN

## 2024-02-24 MED ORDER — GABAPENTIN 300 MG PO CAPS: 300.0000 mg | ORAL_CAPSULE | Freq: Three times a day (TID) | ORAL | Status: DC

## 2024-02-24 MED ORDER — INSULIN ASPART 100 UNIT/ML IJ SOLN
0.0000 [IU] | INTRAMUSCULAR | Status: DC
  Administered 2024-02-24: 5 [IU] via SUBCUTANEOUS
  Administered 2024-02-24: 2 [IU] via SUBCUTANEOUS
  Administered 2024-02-24: 3 [IU] via SUBCUTANEOUS
  Administered 2024-02-25: 5 [IU] via SUBCUTANEOUS
  Administered 2024-02-25: 2 [IU] via SUBCUTANEOUS
  Administered 2024-02-25: 3 [IU] via SUBCUTANEOUS
  Administered 2024-02-25: 2 [IU] via SUBCUTANEOUS
  Administered 2024-02-25: 8 [IU] via SUBCUTANEOUS
  Administered 2024-02-25 – 2024-02-26 (×2): 5 [IU] via SUBCUTANEOUS
  Administered 2024-02-26: 2 [IU] via SUBCUTANEOUS

## 2024-02-24 MED ORDER — GLYCOPYRROLATE PF 0.2 MG/ML IJ SOSY
PREFILLED_SYRINGE | INTRAMUSCULAR | Status: AC
Start: 2024-02-24 — End: 2024-02-24
  Filled 2024-02-24: qty 2

## 2024-02-24 MED ORDER — CEFAZOLIN SODIUM-DEXTROSE 2-4 GM/100ML-% IV SOLN
INTRAVENOUS | Status: AC
  Filled 2024-02-24: qty 100

## 2024-02-24 MED ORDER — MONTELUKAST SODIUM 10 MG PO TABS
10.0000 mg | ORAL_TABLET | Freq: Every day | ORAL | Status: DC
Start: 2024-02-24 — End: 2024-02-26
  Administered 2024-02-24 – 2024-02-25 (×2): 10 mg via ORAL
  Filled 2024-02-24 (×2): qty 1

## 2024-02-24 MED ORDER — SODIUM CHLORIDE 0.9 % IV SOLN: INTRAVENOUS | Status: DC | PRN

## 2024-02-24 MED ORDER — ONDANSETRON HCL 4 MG/2ML IJ SOLN
4.0000 mg | INTRAMUSCULAR | Status: DC | PRN
  Administered 2024-02-24 – 2024-02-25 (×3): 4 mg via INTRAVENOUS
  Filled 2024-02-24 (×3): qty 2

## 2024-02-24 MED ORDER — ONDANSETRON HCL 4 MG/2ML IJ SOLN
INTRAMUSCULAR | Status: AC
  Filled 2024-02-24: qty 2

## 2024-02-24 MED ORDER — GABAPENTIN 300 MG PO CAPS
300.0000 mg | ORAL_CAPSULE | Freq: Three times a day (TID) | ORAL | Status: DC
  Administered 2024-02-24 – 2024-02-26 (×6): 300 mg via ORAL
  Filled 2024-02-24 (×6): qty 1

## 2024-02-24 MED ORDER — LACTATED RINGERS IV SOLN: INTRAVENOUS | Status: DC

## 2024-02-24 MED ORDER — VENLAFAXINE HCL ER 75 MG PO CP24
75.0000 mg | ORAL_CAPSULE | Freq: Every day | ORAL | Status: DC
  Administered 2024-02-25 – 2024-02-26 (×2): 75 mg via ORAL
  Filled 2024-02-24 (×2): qty 1

## 2024-02-24 MED ORDER — DEXAMETHASONE 0.5 MG PO TABS
1.0000 mg | ORAL_TABLET | Freq: Three times a day (TID) | ORAL | Status: AC
  Administered 2024-02-24 – 2024-02-25 (×3): 1 mg via ORAL
  Filled 2024-02-24 (×5): qty 2

## 2024-02-24 MED ORDER — LABETALOL HCL 5 MG/ML IV SOLN
10.0000 mg | INTRAVENOUS | Status: DC | PRN
  Administered 2024-02-24 (×2): 40 mg via INTRAVENOUS
  Filled 2024-02-24 (×2): qty 8

## 2024-02-24 MED ORDER — ROSUVASTATIN CALCIUM 20 MG PO TABS
20.0000 mg | ORAL_TABLET | Freq: Every day | ORAL | Status: DC
  Administered 2024-02-24 – 2024-02-25 (×2): 20 mg via ORAL
  Filled 2024-02-24 (×3): qty 1

## 2024-02-24 MED ORDER — HYDROMORPHONE HCL 1 MG/ML IJ SOLN
INTRAMUSCULAR | Status: AC
  Filled 2024-02-24: qty 1

## 2024-02-24 MED ORDER — LIDOCAINE 2% (20 MG/ML) 5 ML SYRINGE
INTRAMUSCULAR | Status: AC
  Filled 2024-02-24: qty 5

## 2024-02-24 MED ORDER — CHLORHEXIDINE GLUCONATE 0.12 % MT SOLN
15.0000 mL | Freq: Once | OROMUCOSAL | Status: AC
  Administered 2024-02-24: 15 mL via OROMUCOSAL
  Filled 2024-02-24: qty 15

## 2024-02-24 MED ORDER — CEFAZOLIN SODIUM-DEXTROSE 2-4 GM/100ML-% IV SOLN
2.0000 g | Freq: Three times a day (TID) | INTRAVENOUS | Status: DC
  Administered 2024-02-24 – 2024-02-26 (×6): 2 g via INTRAVENOUS
  Filled 2024-02-24 (×6): qty 100

## 2024-02-24 MED ORDER — POVIDONE-IODINE 5 % EX SOLN
CUTANEOUS | Status: DC | PRN
  Administered 2024-02-24: 1 via TOPICAL

## 2024-02-24 MED ORDER — ONDANSETRON HCL 4 MG/2ML IJ SOLN
INTRAMUSCULAR | Status: DC | PRN
  Administered 2024-02-24: 4 mg via INTRAVENOUS

## 2024-02-24 MED ORDER — ESMOLOL HCL 100 MG/10ML IV SOLN
INTRAVENOUS | Status: DC | PRN
Start: 2024-02-24 — End: 2024-02-24
  Administered 2024-02-24: 30 mg via INTRAVENOUS

## 2024-02-24 MED ORDER — BACITRACIN ZINC 500 UNIT/GM EX OINT
TOPICAL_OINTMENT | CUTANEOUS | Status: DC | PRN
  Administered 2024-02-24: 1 via TOPICAL

## 2024-02-24 MED ORDER — ONDANSETRON HCL 4 MG/2ML IJ SOLN
4.0000 mg | Freq: Once | INTRAMUSCULAR | Status: AC | PRN
  Administered 2024-02-24: 4 mg via INTRAVENOUS

## 2024-02-24 MED ORDER — BACITRACIN ZINC 500 UNIT/GM EX OINT
TOPICAL_OINTMENT | CUTANEOUS | Status: AC
  Filled 2024-02-24: qty 28.35

## 2024-02-24 MED ORDER — SUCCINYLCHOLINE CHLORIDE 200 MG/10ML IV SOSY
PREFILLED_SYRINGE | INTRAVENOUS | Status: AC
  Filled 2024-02-24: qty 10

## 2024-02-24 MED ORDER — ACETAMINOPHEN 650 MG RE SUPP: 650.0000 mg | RECTAL | Status: DC | PRN

## 2024-02-24 MED ORDER — ASPIRIN 81 MG PO TBEC
81.0000 mg | DELAYED_RELEASE_TABLET | Freq: Once | ORAL | Status: AC
  Administered 2024-02-24: 81 mg via ORAL
  Filled 2024-02-24: qty 1

## 2024-02-24 MED ORDER — LEVETIRACETAM 500 MG/5ML IV SOLN
INTRAVENOUS | Status: AC
  Filled 2024-02-24: qty 10

## 2024-02-24 MED ORDER — PHENYLEPHRINE HCL-NACL 20-0.9 MG/250ML-% IV SOLN
INTRAVENOUS | Status: DC | PRN
  Administered 2024-02-24: 30 ug/min via INTRAVENOUS

## 2024-02-24 MED ORDER — BUPROPION HCL ER (SR) 100 MG PO TB12
200.0000 mg | ORAL_TABLET | Freq: Two times a day (BID) | ORAL | Status: DC
  Administered 2024-02-24 – 2024-02-26 (×4): 200 mg via ORAL
  Filled 2024-02-24 (×5): qty 2

## 2024-02-24 MED ORDER — GABAPENTIN 300 MG PO CAPS
300.0000 mg | ORAL_CAPSULE | Freq: Every day | ORAL | Status: DC
  Administered 2024-02-24 – 2024-02-25 (×2): 300 mg via ORAL
  Filled 2024-02-24 (×2): qty 1

## 2024-02-24 MED ORDER — PHENYLEPHRINE 80 MCG/ML (10ML) SYRINGE FOR IV PUSH (FOR BLOOD PRESSURE SUPPORT)
PREFILLED_SYRINGE | INTRAVENOUS | Status: DC | PRN
  Administered 2024-02-24 (×3): 80 ug via INTRAVENOUS

## 2024-02-24 MED ORDER — AZELASTINE HCL 137 MCG/SPRAY NA SOLN: 1.0000 | Freq: Every day | NASAL | Status: DC

## 2024-02-24 MED ORDER — CLEVIDIPINE BUTYRATE 0.5 MG/ML IV EMUL
0.0000 mg/h | INTRAVENOUS | Status: DC
  Administered 2024-02-24: 2 mg/h via INTRAVENOUS

## 2024-02-24 MED ORDER — DEXAMETHASONE SODIUM PHOSPHATE 10 MG/ML IJ SOLN
INTRAMUSCULAR | Status: DC | PRN
  Administered 2024-02-24: 10 mg via INTRAVENOUS

## 2024-02-24 MED ORDER — LISINOPRIL 10 MG PO TABS
10.0000 mg | ORAL_TABLET | Freq: Every day | ORAL | Status: DC
  Administered 2024-02-24 – 2024-02-26 (×3): 10 mg via ORAL
  Filled 2024-02-24 (×3): qty 1

## 2024-02-24 MED ORDER — ONDANSETRON HCL 4 MG PO TABS: 4.0000 mg | ORAL_TABLET | ORAL | Status: DC | PRN

## 2024-02-24 MED ORDER — ORAL CARE MOUTH RINSE: 15.0000 mL | Freq: Once | OROMUCOSAL | Status: AC

## 2024-02-24 MED ORDER — HYDROMORPHONE HCL 1 MG/ML IJ SOLN
0.2500 mg | INTRAMUSCULAR | Status: DC | PRN
  Administered 2024-02-24 (×3): 0.5 mg via INTRAVENOUS

## 2024-02-24 MED ORDER — PANTOPRAZOLE SODIUM 40 MG IV SOLR
40.0000 mg | Freq: Every day | INTRAVENOUS | Status: DC
  Administered 2024-02-24: 40 mg via INTRAVENOUS
  Filled 2024-02-24: qty 10

## 2024-02-24 MED ORDER — DEXAMETHASONE SODIUM PHOSPHATE 10 MG/ML IJ SOLN
INTRAMUSCULAR | Status: AC
  Filled 2024-02-24: qty 1

## 2024-02-24 MED ORDER — GLYCOPYRROLATE PF 0.2 MG/ML IJ SOSY
PREFILLED_SYRINGE | INTRAMUSCULAR | Status: DC | PRN
  Administered 2024-02-24 (×2): .4 mg via INTRAVENOUS

## 2024-02-24 MED ORDER — PROPOFOL 10 MG/ML IV BOLUS
INTRAVENOUS | Status: DC | PRN
Start: 2024-02-24 — End: 2024-02-24
  Administered 2024-02-24: 120 mg via INTRAVENOUS

## 2024-02-24 MED ORDER — SODIUM CHLORIDE 0.9 % IV SOLN
0.5000 ug/kg/min | INTRAVENOUS | Status: DC
  Administered 2024-02-24 (×2): .3 ug/kg/min via INTRAVENOUS
  Filled 2024-02-24 (×8): qty 2000

## 2024-02-24 MED ORDER — CLEVIDIPINE BUTYRATE 0.5 MG/ML IV EMUL
INTRAVENOUS | Status: AC
  Filled 2024-02-24: qty 100

## 2024-02-24 MED ORDER — PHENYLEPHRINE 80 MCG/ML (10ML) SYRINGE FOR IV PUSH (FOR BLOOD PRESSURE SUPPORT)
PREFILLED_SYRINGE | INTRAVENOUS | Status: AC
  Filled 2024-02-24: qty 10

## 2024-02-24 SURGICAL SUPPLY — 71 items
BAG COUNTER SPONGE SURGICOUNT (BAG) ×2 IMPLANT
BNDG GAUZE DERMACEA FLUFF 4 (GAUZE/BANDAGES/DRESSINGS) ×2 IMPLANT
BOWL CEMENT MIX W SPATULA BONE (MISCELLANEOUS) IMPLANT
BUR ACORN 6.0 PRECISION (BURR) ×2 IMPLANT
BUR MATCHSTICK NEURO 3.0 LAGG (BURR) ×2 IMPLANT
BUR PRECISION FLUTE 5.0 (BURR) IMPLANT
BUR SPIRAL ROUTER 2.3 (BUR) ×2 IMPLANT
CANISTER SUCTION 3000ML PPV (SUCTIONS) ×2 IMPLANT
CASSETTE SUCT IRRIG SONOPET IQ (MISCELLANEOUS) IMPLANT
CHLORAPREP W/TINT 26 (MISCELLANEOUS) ×2 IMPLANT
CLIP TI MEDIUM 6 (CLIP) IMPLANT
CNTNR URN SCR LID CUP LEK RST (MISCELLANEOUS) ×2 IMPLANT
COVER MAYO STAND STRL (DRAPES) ×6 IMPLANT
DRAIN 1/8 RD END PERF LFSIL ST (DRAIN) IMPLANT
DRAPE MICROSCOPE LEICA (MISCELLANEOUS) ×2 IMPLANT
DRAPE NEUROLOGICAL W/INCISE (DRAPES) ×2 IMPLANT
DRAPE UTILITY 15X26 TOWEL STRL (DRAPES) ×4 IMPLANT
DRAPE WARM FLUID 44X44 (DRAPES) ×2 IMPLANT
DRSG TEGADERM 4X4.75 (GAUZE/BANDAGES/DRESSINGS) ×2 IMPLANT
DRSG TELFA 3X8 NADH STRL (GAUZE/BANDAGES/DRESSINGS) ×4 IMPLANT
ELECTRODE REM PT RTRN 9FT ADLT (ELECTROSURGICAL) ×2 IMPLANT
EVACUATOR SILICONE 100CC (DRAIN) IMPLANT
FEE COVERAGE SUPPORT O-ARM (MISCELLANEOUS) ×2 IMPLANT
FEE INTRAOP CADWELL SUPPLY NCS (MISCELLANEOUS) IMPLANT
FEE INTRAOP MONITOR IMPULS NCS (MISCELLANEOUS) IMPLANT
FORCEPS BIPOLAR SPETZLER 8 1.0 (NEUROSURGERY SUPPLIES) ×2 IMPLANT
GAUZE 4X4 16PLY ~~LOC~~+RFID DBL (SPONGE) IMPLANT
GAUZE PAD ABD 8X10 STRL (GAUZE/BANDAGES/DRESSINGS) IMPLANT
GAUZE SPONGE 4X4 12PLY STRL (GAUZE/BANDAGES/DRESSINGS) IMPLANT
GLOVE BIOGEL M SZ8.5 STRL (GLOVE) ×4 IMPLANT
GLOVE EXAM NITRILE XL STR (GLOVE) IMPLANT
GOWN STRL REUS W/ TWL LRG LVL3 (GOWN DISPOSABLE) IMPLANT
GOWN STRL REUS W/ TWL XL LVL3 (GOWN DISPOSABLE) IMPLANT
GOWN STRL SURGICAL XL XLNG (GOWN DISPOSABLE) ×2 IMPLANT
GRAFT PERICARIUM DURAL BV 4X5 (Graft) IMPLANT
HEMOSTAT SURGICEL 2X14 (HEMOSTASIS) ×2 IMPLANT
KIT BASIN OR (CUSTOM PROCEDURE TRAY) ×2 IMPLANT
KIT TURNOVER KIT B (KITS) ×2 IMPLANT
MARKER SKIN DUAL TIP RULER LAB (MISCELLANEOUS) IMPLANT
MARKER SPHERE PSV REFLC NDI (MISCELLANEOUS) ×6 IMPLANT
NS IRRIG 1000ML POUR BTL (IV SOLUTION) ×2 IMPLANT
PACK CRANIOTOMY CUSTOM (CUSTOM PROCEDURE TRAY) ×2 IMPLANT
PATTIES SURGICAL .25X.25 (GAUZE/BANDAGES/DRESSINGS) IMPLANT
PATTIES SURGICAL .5 X.5 (GAUZE/BANDAGES/DRESSINGS) ×2 IMPLANT
PATTIES SURGICAL .5 X3 (DISPOSABLE) IMPLANT
PATTIES SURGICAL 1X1 (DISPOSABLE) ×2 IMPLANT
PIN MAYFIELD SKULL DISP (PIN) IMPLANT
POINTER NAVIGATION AXIEM (INSTRUMENTS) ×2 IMPLANT
POINTER TRACER AXIEM (INSTRUMENTS) IMPLANT
PROBE MONO 100X0.75X1.9 NCS (MISCELLANEOUS) IMPLANT
SET CRAINOPLASTY (SET/KITS/TRAYS/PACK) IMPLANT
SOL PREP POV-IOD 4OZ 10% (MISCELLANEOUS) ×2 IMPLANT
SPECIMEN JAR SMALL (MISCELLANEOUS) IMPLANT
SPIKE FLUID TRANSFER (MISCELLANEOUS) ×2 IMPLANT
SPONGE NEURO XRAY DETECT 1X3 (DISPOSABLE) IMPLANT
SPONGE SURGIFOAM ABS GEL 100C (HEMOSTASIS) IMPLANT
STAPLER SKIN PROX 35W (STAPLE) ×2 IMPLANT
SURGIFLO W/THROMBIN 8M KIT (HEMOSTASIS) IMPLANT
SUT PROLENE 5 0 RB 2 (SUTURE) IMPLANT
SUT VIC AB 0 CT2 8-18 (SUTURE) ×4 IMPLANT
SYR 30ML SLIP (SYRINGE) ×2 IMPLANT
SYR CONTROL 10ML LL (SYRINGE) ×2 IMPLANT
TIP TISSUE SONOPET IQ STD 12 (TIP) IMPLANT
TOWEL GREEN STERILE (TOWEL DISPOSABLE) ×2 IMPLANT
TOWEL GREEN STERILE FF (TOWEL DISPOSABLE) ×2 IMPLANT
TRACKER ENT PATIENT (MISCELLANEOUS) IMPLANT
TRAY FOLEY MTR SLVR 14FR STAT (SET/KITS/TRAYS/PACK) IMPLANT
TRAY FOLEY MTR SLVR 16FR STAT (SET/KITS/TRAYS/PACK) IMPLANT
TUBE CONNECTING 20X1/4 (TUBING) IMPLANT
TUBING FEATHERFLOW (TUBING) IMPLANT
WATER STERILE IRR 1000ML POUR (IV SOLUTION) ×2 IMPLANT

## 2024-02-24 NOTE — Anesthesia Procedure Notes (Signed)
 Arterial Line Insertion Start/End8/25/2025 7:00 AM, 02/24/2024 7:15 AM Performed by: CRNA  Patient location: Pre-op. Preanesthetic checklist: patient identified, IV checked, site marked, risks and benefits discussed, surgical consent, monitors and equipment checked, pre-op evaluation, timeout performed and anesthesia consent Lidocaine  1% used for infiltration Left, radial was placed Catheter size: 20 G Hand hygiene performed  and maximum sterile barriers used  Allen's test indicative of satisfactory collateral circulation Attempts: 1 Procedure performed without using ultrasound guided technique. Following insertion, dressing applied and Biopatch. Post procedure assessment: normal and unchanged  Patient tolerated the procedure well with no immediate complications.

## 2024-02-24 NOTE — Op Note (Signed)
 DATE OF SURGERY: 02/24/2024   ATTENDING SURGEON: Dino Sable, MD   Co-SURGEON: None   PREOPERATIVE DIAGNOSIS: RIGHT Trigeminal Schwannoma   POSTOPERATIVE DIAGNOSIS: Same    PROCEDURE PERFORMED:  1. RIGHT retrosigmoid craniectomy for posterior fossa tumor (38479) 2. RIGHT posterior fossa resection, intradural (38383) 3. Cranioplasty, > 5 cm (62140) 4. Operative navigation, image-based (38217) 5. Microdissection (69990) 6. Cranial nerve monitoring (04132) 7. Duraplasty >5cm   ANESTHESIA: General endotracheal anesthesia.     ESTIMATED BLOOD LOSS, URINE OUTPUT, AND CRYSTALLOIDS:   See anesthesia chart.     COMPLICATIONS: None.     SPECIMENS: Tumor   DRAINS: None    PREOPERATIVE COURSE:   68 year old gentleman with a progressively enlarging right-sided trigeminal schwannoma.  Options were discussed with the patient of doing nothing versus radiation versus surgery and she opted for the surgery.  The risk discussed included but not limited to infection, hemorrhage, stroke, paralysis, blindness, speech impairment, seizures, DVT/PE, cardiopulmonary complication and death among others.  Specifically the risk of facial paralysis and need for reoperation and the risk of CSF leak were discussed with him.  After all his question were answered to his satisfaction as voiced by him he requested for us  to proceed.  PROCEDURE NOTE: Patient brought to the operating room and after general trach anesthesia was inserted he was placed in a lateral position and secured to the bed and the head was secured in a [place] head holder.  Pressure points were padded after which a curvilinear incision was outlined on the scalp and he was prepped and draped in usual sterile fashion.  After the outline was injected with lidocaine  with epinephrine .  Using 10 blade an incision was made and the skin flap was reflected medially.  The muscle was removed from the bone and retractor placed.  Head after the  occipital bone was thinned out with an acorn drill bit and a craniectomy was performed.  The transverse sinus as well as the sigmoid sinus were identified after which the dura was opened in a V-shaped fashion and reflected anteriorly.  The microscope was brought in for microscopic dissection.  Using a Telfa pad a gentle retraction was performed the cerebellum and the inferior petrosal veins were identified.  The facial nerve was also identified and the trigeminal nerve and the depth could be identified.  Splayed underneath it was the tumor and with stimulation the 5 and 7 were confirmed.  Thereafter I took a biopsy and sent it for frozen section and then also for permanent section and internal debulking was performed and the tumor was dissected away from the brainstem.  My goal was to be able to decompress the brainstem and get a biopsy and I did not pursue further dissection because of the thinned out trigeminal fibers and I did not want to give the patient a deficit.  The edges were cauterized after which Surgicel was applied and all nerve stimulated properly at the end of the case.  The microscope was then taken out after which a patch graft was sewn in to the dura for watertight closure and thereafter, over the expanded craniectomy, cranioplasty was performed with artificial bone.  Prior to that, the bone edges were waxed.  After copious irrigation performed again and the soft tissue was closed in interrupted layers and the skin was stapled down.  At the end of the procedure all counts were complete.

## 2024-02-24 NOTE — OR Nursing (Signed)
 CRANIOPLASTIC CEMENT USED BY SURGEON EXPIRES 03/26 LOT NUMBER [id number]

## 2024-02-24 NOTE — H&P (Signed)
 68yo gentleman with a right trigeminal tumor  Past Medical History:  Diagnosis Date   Anxiety    Arthritis    back, shoulders, left great toe   Candidiasis, mouth 03/22/2016   Carpal tunnel syndrome of right wrist 10/2014   Dental crowns present    Depression    Dyslipidemia    Ear fullness 08/30/2016   GERD (gastroesophageal reflux disease)    Headache    Tension Headaches   Hypertension    under control with med., per pt.; has been on med. x 15-20 yr.   Insulin  dependent diabetes mellitus    Nummular eczema    Obesity    Onychomycosis 08/30/2016   Recurrent infections 03/22/2016   Sleep apnea    uses CPAP nightly   Psh Social History   Socioeconomic History   Marital status: Single    Spouse name: Not on file   Number of children: Not on file   Years of education: Not on file   Highest education level: Not on file  Occupational History   Not on file  Tobacco Use   Smoking status: Former    Current packs/day: 0.00    Average packs/day: 1 pack/day for 33.0 years (33.0 ttl pk-yrs)    Types: Cigarettes    Start date: 4    Quit date: 07/03/2007    Years since quitting: 16.6   Smokeless tobacco: Never  Vaping Use   Vaping status: Never Used  Substance and Sexual Activity   Alcohol use: Not Currently   Drug use: Yes    Frequency: 7.0 times per week    Types: Marijuana    Comment: 2 bowls per day   Sexual activity: Not Currently  Other Topics Concern   Not on file  Social History Narrative   Pt lives alone    Pt works    Social Drivers of Corporate investment banker Strain: Not on file  Food Insecurity: No Food Insecurity (01/28/2024)   Hunger Vital Sign    Worried About Running Out of Food in the Last Year: Never true    Ran Out of Food in the Last Year: Never true  Transportation Needs: No Transportation Needs (01/28/2024)   PRAPARE - Administrator, Civil Service (Medical): No    Lack of Transportation (Non-Medical): No  Physical  Activity: Not on file  Stress: Not on file  Social Connections: Not on file  Intimate Partner Violence: Not At Risk (01/28/2024)   Humiliation, Afraid, Rape, and Kick questionnaire    Fear of Current or Ex-Partner: No    Emotionally Abused: No    Physically Abused: No    Sexually Abused: No   Family History  Problem Relation Age of Onset   Heart disease Mother    Cancer Mother 68       Multiple myeloma   Diabetes Father    Diabetes Brother    Heart disease Brother 49       cardiomyopathy   Heart disease Brother 67       MI   Arthritis Paternal Grandmother    Diabetes Paternal Grandmother    Heart disease Paternal Grandmother    Stroke Paternal Grandmother    Allergies  Allergen Reactions   Hydrogen Peroxide     Peeling of gums per patient    Latex Itching   Scheduled Meds:  chlorhexidine   15 mL Mouth/Throat Once   Or   mouth rinse  15 mL Mouth Rinse Once  Continuous Infusions:  ceFAZolin      ceFAZolin      lactated ringers      remifentanil  (ULTIVA ) 2 mg in 100 mL normal saline (20 mcg/mL) Optime     PRN Meds:.ceFAZolin , insulin  aspart Vitals:   02/24/24 0554  BP: 114/74  Pulse: 60  Resp: 18  Temp: 98 F (36.7 C)  SpO2: 96%   Physical Exam HENT:     Head: Normocephalic.     Nose: Nose normal.  Eyes:     Pupils: Pupils are equal, round, and reactive to light.  Cardiovascular:     Rate and Rhythm: Normal rate.  Pulmonary:     Effort: Pulmonary effort is normal.  Abdominal:     General: Abdomen is flat.  Musculoskeletal:     Cervical back: Normal range of motion.  Neurological:     Mental Status: He is alert.   ASSESSMENT: RIGHT TRIGEMINAL TUMOR  PLAN: RIGHT POSTERIOR FOSSA CRANIOTOMY FOR TUMOR

## 2024-02-24 NOTE — Progress Notes (Signed)
 Patient admitted to 4N31; noted patient in ventricular bigeminy, Dr. Claudene and Deward NP notified at bedside. Patient wearing freestyle Libre CGM, but has no blood sugar orders; obtained CBG of 192 on admission and notified CCM for CBG orders. Patient c/o nausea and pain; phenergan  and fentanyl  administered. Bladder scan shows over 700 in bladder, in/out cath performed per CCM and 800 ml of clear yellow urine obtained.

## 2024-02-24 NOTE — Consult Note (Signed)
 NAME:  [name], MRN:  [id number], DOB:  1955-07-19, LOS: 0 ADMISSION DATE:  02/24/2024, CONSULTATION DATE:  8/25 REFERRING MD:  [place], CHIEF COMPLAINT:  Post-op crani   History of Present Illness:  68 year old male with past medical history as below, which is significant for diabetes mellitus, hyperlipidemia, hypertension, sleep apnea.  Was seen by neurosurgery with a short history of right sided facial numbness and in symptoms along with trigeminal neuralgia.  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  Based on tumor growth it was decided to forego trial of steroids and proceed to resection with biopsy.  He presented to [facility] 8/25 and underwent right posterior fossa craniotomy and trigeminal tumor resection.  PCCM is asked to assist with medical management in the postoperative setting  Pertinent  Medical History   has a past medical history of Anxiety, Arthritis, Candidiasis, mouth (03/22/2016), Carpal tunnel syndrome of right wrist (10/2014), Dental crowns present, Depression, Dyslipidemia, Ear fullness (08/30/2016), GERD (gastroesophageal reflux disease), Headache, Hypertension, Insulin  dependent diabetes mellitus, Nummular eczema, Obesity, Onychomycosis (08/30/2016), Recurrent infections (03/22/2016), and Sleep apnea.   Significant Hospital Events: Including procedures, antibiotic start and stop dates in addition to other pertinent events   8/25 R posterior fossa craniotomy for trigeminal tumor resection and biopsy.   Interim History / Subjective:    Objective    Blood pressure (!) 159/70, pulse 73, temperature 97.6 F (36.4 C), resp. rate 17, height 5' 11 (1.803 m), weight 98.4 kg, SpO2 94%.        Intake/Output Summary (Last 24 hours) at 02/24/2024 1233 Last data filed at 02/24/2024 1050 Gross per 24 hour  Intake 900 ml  Output 100 ml  Net 800 ml   Filed Weights   02/24/24 0554   Weight: 98.4 kg    Examination: General: Adult male of normal body habitus in NAD HENT: Surgical closure device/dressing in place over the right temple. Dry and intact. Very small L forehead abrasion.  Lungs: Clear bilateral breath sounds Cardiovascular: RRR, no MRG Abdomen: Soft, NT, ND Extremities: No acute deformity Neuro: Somnolent postoperatively. Arouses easily and answers questions appropriately.   Resolved problem list   Assessment and Plan   Right trigeminal tumor: s/p resection and biopsy 8/25 - Management per neurosurgery - Decadron  - Multimodal pain management with Tylenol , Fioricet , fentanyl  - perioperative ancef  - Gabapentin  per home regimen  Hypertension HLD - Continue home lisinopril , statin - Cleviprex  to keep  SBP < 160 mmHg  DM - CBG monitoring and SSI - Holding home jardiance, metformin - Holding lantus until taking PO  MDD - continue home bupropion , venlafaxine   OSA on CPAP:  - hold CPAP due to nausea   Best Practice (right click and Reselect all SmartList Selections daily)   Diet/type: Regular consistency (see orders) DVT prophylaxis not indicated - defer initially post-op Pressure ulcer(s): pressure ulcer assessment deferred  GI prophylaxis: PPI Lines: N/A Foley:  N/A Code Status:  full code Last date of multidisciplinary goals of care discussion []   Labs   CBC: Recent Labs  Lab 02/19/24 1500  WBC 8.3  HGB 14.8  HCT 44.3  MCV 94.9  PLT 235    Basic Metabolic Panel: Recent Labs  Lab 02/19/24 1500  NA 140  K 4.3  CL 104  CO2 25  GLUCOSE 120*  BUN 14  CREATININE 0.79  CALCIUM  9.2   GFR: Estimated Creatinine Clearance: 108.6 mL/min (by C-G  formula based on SCr of 0.79 mg/dL). Recent Labs  Lab 02/19/24 1500  WBC 8.3    Liver Function Tests: No results for input(s): AST, ALT, ALKPHOS, BILITOT, PROT, ALBUMIN in the last 168 hours. No results for input(s): LIPASE, AMYLASE in the last 168  hours. No results for input(s): AMMONIA in the last 168 hours.  ABG    Component Value Date/Time   PHART 7.361 12/30/2006 1527   PCO2ART 42.7 12/30/2006 1527   PO2ART 55.0 (L) 12/30/2006 1527   HCO3 25.1 (H) 12/30/2006 1533   TCO2 23 05/19/2015 0730   ACIDBASEDEF 1.0 12/30/2006 1533   O2SAT 63.0 12/30/2006 1533     Coagulation Profile: No results for input(s): INR, PROTIME in the last 168 hours.  Cardiac Enzymes: No results for input(s): CKTOTAL, CKMB, CKMBINDEX, TROPONINI in the last 168 hours.  HbA1C: Hemoglobin A1C  Date/Time Value Ref Range Status  04/10/2022 12:00 AM 5.2  Final  11/17/2021 12:00 AM 6.6  Final   Hgb A1c MFr Bld  Date/Time Value Ref Range Status  02/19/2024 03:00 PM 6.5 (H) 4.8 - 5.6 % Final    Comment:    (NOTE) Diagnosis of Diabetes The following HbA1c ranges recommended by the American Diabetes Association (ADA) may be used as an aid in the diagnosis of diabetes mellitus.  Hemoglobin             Suggested A1C NGSP%              Diagnosis  <5.7                   Non Diabetic  5.7-6.4                Pre-Diabetic  >6.4                   Diabetic  <7.0                   Glycemic control for                       adults with diabetes.      CBG: Recent Labs  Lab 02/19/24 1341 02/24/24 0558 02/24/24 1112  GLUCAP 190* 155* 192*    Review of Systems:   Bolds are positive  Constitutional: weight loss, gain, night sweats, Fevers, chills, fatigue .  HEENT: headaches, Sore throat, sneezing, nasal congestion, post nasal drip, Difficulty swallowing, Tooth/dental problems, visual complaints visual changes, ear ache CV:  chest pain, radiates:,Orthopnea, PND, swelling in lower extremities, dizziness, palpitations, syncope.  GI  heartburn, indigestion, abdominal pain, nausea, vomiting, diarrhea, change in bowel habits, loss of appetite, bloody stools.  Resp: cough, productive: , hemoptysis, dyspnea, chest pain, pleuritic.  Skin:  rash or itching or icterus GU: dysuria, change in color of urine, urgency or frequency. flank pain, hematuria  MS: joint pain or swelling. decreased range of motion  Psych: change in mood or affect. depression or anxiety.  Neuro: difficulty with speech, weakness, numbness, ataxia    Past Medical History:  He,  has a past medical history of Anxiety, Arthritis, Candidiasis, mouth (03/22/2016), Carpal tunnel syndrome of right wrist (10/2014), Dental crowns present, Depression, Dyslipidemia, Ear fullness (08/30/2016), GERD (gastroesophageal reflux disease), Headache, Hypertension, Insulin  dependent diabetes mellitus, Nummular eczema, Obesity, Onychomycosis (08/30/2016), Recurrent infections (03/22/2016), and Sleep apnea.   Surgical History:   Past Surgical History:  Procedure Laterality Date   CARDIAC CATHETERIZATION  12/30/2006   mod. pulmonary HTN  with preserved cardiac output and cardiac index   CARPAL TUNNEL RELEASE Right 10/14/2014   Procedure: RIGHT CARPAL TUNNEL RELEASE;  Surgeon: Franky Curia, MD;  Location: [facility];  Service: Orthopedics;  Laterality: Right;   CARPAL TUNNEL RELEASE Left 05/19/2015   Procedure: LEFT CARPAL TUNNEL RELEASE;  Surgeon: Franky Curia, MD;  Location: [facility];  Service: Orthopedics;  Laterality: Left;   COLONOSCOPY     CYST EXCISION     scalp and back   ESOPHAGOGASTRODUODENOSCOPY  03/25/2007   FOOT SURGERY Left    revision traumatic amputation of foot x5   ROUX-EN-Y PROCEDURE  07/28/2007   with lysis of adhesions     Social History:   reports that he quit smoking about 16 years ago. His smoking use included cigarettes. He started smoking about 49 years ago. He has a 33 pack-year smoking history. He has never used smokeless tobacco. He reports that he does not currently use alcohol. He reports current drug use. Frequency: 7.00 times per week. Drug: Marijuana.   Family History:  His family history includes Arthritis in  his paternal grandmother; Cancer (age of onset: 63) in his mother; Diabetes in his brother, father, and paternal grandmother; Heart disease in his mother and paternal grandmother; Heart disease (age of onset: 71) in his brother; Heart disease (age of onset: 31) in his brother; Stroke in his paternal grandmother.   Allergies Allergies  Allergen Reactions   Hydrogen Peroxide     Peeling of gums per patient    Latex Itching     Home Medications  Prior to Admission medications   Medication Sig Start Date End Date Taking? Authorizing Provider  acetaminophen  (TYLENOL ) 500 MG tablet Take 500-1,000 mg by mouth every 6 (six) hours as needed (pain.).   Yes [provider]  Azelastine  HCl 137 MCG/SPRAY SOLN Place 1 spray into both nostrils daily. 09/18/22  Yes Joyce Norleen BROCKS, MD  B Complex-C (B-COMPLEX WITH VITAMIN C) tablet Take 1 tablet by mouth 2 (two) times daily.   Yes [provider]  buPROPion  (WELLBUTRIN  SR) 200 MG 12 hr tablet Take 1 tablet (200 mg total) by mouth 2 (two) times daily. 02/04/24  Yes Plovsky, Elna, MD  CALCIUM  CITRATE-VITAMIN D PO Take 1 tablet by mouth 2 (two) times daily.   Yes [provider]  celecoxib  (CELEBREX ) 100 MG capsule TAKE 1 CAPSULE BY MOUTH TWICE  DAILY 09/13/23  Yes Magnant, Charles L, PA-C  cetirizine (ZYRTEC) 10 MG tablet Take 10 mg by mouth in the morning.   Yes [provider]  Continuous Blood Gluc Sensor (FREESTYLE LIBRE 2 SENSOR) MISC Now on Freestyle Libre 3 03/21/21  Yes [provider]  cyanocobalamin (VITAMIN B12) 1000 MCG tablet Take 1,000 mcg by mouth in the morning.   Yes [provider]  DENTA 5000 PLUS 1.1 % CREA dental cream Place 1 Application onto teeth in the morning and at bedtime. BRUSH WITH PEASIZED AMOUNT TWICE DAILY THEN SPIT OUT, NO FOOD/DRINK FOR 30 MINUTES AFTER 04/22/20  Yes [provider]  desvenlafaxine  (PRISTIQ ) 50 MG 24 hr tablet 2 qam 02/04/24  Yes Plovsky, Elna, MD   empagliflozin (JARDIANCE) 25 MG TABS tablet Take 25 mg by mouth in the morning. 12/09/23  Yes [provider]  Ferrous Sulfate  (IRON  SLOW RELEASE) 142 (45 Fe) MG TBCR Take 1 tablet by mouth daily. 07/17/21  Yes Joyce Norleen BROCKS, MD  fluticasone  (FLONASE ) 50 MCG/ACT nasal spray USE 1 SPRAY NASALLY ONCE  A  DAY FOR ALLERGIES Patient taking differently: Place 2 sprays into both nostrils every evening. 02/21/23  Yes Joyce Norleen BROCKS, MD  gabapentin  (NEURONTIN ) 300 MG capsule Take 1 capsule (300 mg) by mouth in the morning, take 1 capsule (300 mg) by mouth with supper & take 2 capsules (600 mg) by mouth at bedtime. 02/19/24  Yes Janjua, Rashid M, MD  insulin  glargine (LANTUS SOLOSTAR) 100 UNIT/ML Solostar Pen Inject 12 Units into the skin in the morning. 09/14/19  Yes [provider]  levocetirizine (XYZAL ) 5 MG tablet TAKE 1 TABLET BY MOUTH AT  BEDTIME 09/11/23  Yes Lalonde, John C, MD  lisinopril  (ZESTRIL ) 10 MG tablet Take 1 tablet (10 mg total) by mouth daily. 03/20/23  Yes Lalonde, John C, MD  metFORMIN (GLUCOPHAGE) 1000 MG tablet Take 1,000 mg by mouth 2 (two) times daily with a meal.   Yes [provider]  montelukast  (SINGULAIR ) 10 MG tablet Take 1 tablet (10 mg total) by mouth at bedtime. 03/20/23  Yes Lalonde, John C, MD  Multiple Vitamin (MULTIVITAMIN WITH MINERALS) TABS tablet Take 1 tablet by mouth in the morning and at bedtime.   Yes [provider]  AISHA PASTOR LANCETS 33G MISC  08/15/15  Yes [provider]  AISHA SINKS test strip  08/15/15  Yes [provider]  pilocarpine (SALAGEN) 5 MG tablet Take 5 mg by mouth 3 (three) times daily.   Yes [provider]  Polyethyl Glycol-Propyl Glycol (SYSTANE) 0.4-0.3 % SOLN Place 1 drop into both eyes 3 (three) times daily as needed (dry/irritated eyes.).   Yes [provider]  rosuvastatin  (CRESTOR ) 20 MG tablet TAKE 1 TABLET BY MOUTH DAILY Patient taking differently: Take 20 mg by  mouth every evening. 10/04/23  Yes Joyce Norleen BROCKS, MD  tadalafil  (CIALIS ) 20 MG tablet Take 1 tablet (20 mg total) by mouth daily as needed for erectile dysfunction. 12/31/23  Yes Joyce Norleen BROCKS, MD  ASPIRIN  LOW DOSE 81 MG EC tablet TAKE 1 TABLET BY MOUTH AT  BEDTIME . SWALLOW WHOLE. 05/17/20   Joyce Norleen BROCKS, MD  Continuous Glucose Receiver (FREESTYLE LIBRE 2 READER) DEVI Scan as needed for continuous glucose monitoring. 08/29/22   [provider]  glucose blood test strip USE TO CHECK SUGAR 4 TIMES  DAILY. 11/18/18   [provider]  Insulin  Pen Needle 32G X 4 MM MISC Use to inject insulin  once a day or as directed 07/23/17   [provider]  Insulin  Syringe-Needle U-100 (INSULIN  SYRINGE 1CC/31GX5/16) 31G X 5/16 1 ML MISC  08/30/16   [provider]  traZODone  (DESYREL ) 100 MG tablet Take 100 mg by mouth at bedtime as needed for sleep.    [provider]     Critical care time: 39 min     Deward Eastern, AGACNP-BC [facility]  See Amion for personal pager PCCM on call pager [phone] until 7pm. Please call Elink 7p-7a. [phone]  02/24/2024 1:12 PM

## 2024-02-24 NOTE — Anesthesia Procedure Notes (Signed)
 Procedure Name: Intubation Date/Time: 02/24/2024 7:45 AM  Performed by: Harrold Macintosh, CRNAPre-anesthesia Checklist: Patient identified, Emergency Drugs available, Suction available and Patient being monitored Patient Re-evaluated:Patient Re-evaluated prior to induction Oxygen Delivery Method: Circle system utilized Preoxygenation: Pre-oxygenation with 100% oxygen Induction Type: IV induction Ventilation: Mask ventilation without difficulty Laryngoscope Size: Miller and 3 Grade View: Grade II Tube type: Oral (NIM tube) Tube size: 7.0 mm Number of attempts: 1 Airway Equipment and Method: LTA kit utilized and Bite block Placement Confirmation: ETT inserted through vocal cords under direct vision, positive ETCO2 and breath sounds checked- equal and bilateral Secured at: 21 cm Tube secured with: Tape Dental Injury: Teeth and Oropharynx as per pre-operative assessment

## 2024-02-24 NOTE — Transfer of Care (Signed)
 Immediate Anesthesia Transfer of Care Note  Patient: [name]  Procedure(s) Performed: RIGHT POSTERIOR FOSSA CRANIOTOMY TUMOR RESECTION (Right)  Patient Location: PACU  Anesthesia Type:General  Level of Consciousness: awake, alert , and oriented  Airway & Oxygen Therapy: Patient Spontanous Breathing  Post-op Assessment: Report given to RN, Post -op Vital signs reviewed and stable, Patient moving all extremities X 4, and Patient able to stick tongue midline  Post vital signs: Reviewed and stable  Last Vitals:  Vitals Value Taken Time  BP 148/84 02/24/24 11:09  Temp 98.6   Pulse 78 02/24/24 11:10  Resp 16 02/24/24 11:10  SpO2 91 % 02/24/24 11:10  Vitals shown include unfiled device data.  Last Pain:  Vitals:   02/24/24 0640  PainSc: 5       Patients Stated Pain Goal: 1 (02/24/24 0640)  Complications: No notable events documented.

## 2024-02-24 NOTE — Brief Op Note (Signed)
 02/24/2024  12:15 PM  PATIENT:  Curtistine LITTIE Jobs  68 y.o. male  PRE-OPERATIVE DIAGNOSIS:  G50.9 Trigeminal nerve disorder  D32.9Meningioma D32.9  POST-OPERATIVE DIAGNOSIS:  G50.9 Trigeminal nerve disorder D32.9Meningioma D32.9  PROCEDURE:  Procedure(s): RIGHT POSTERIOR FOSSA CRANIOTOMY TUMOR RESECTION (Right)  SURGEON:  Surgeons and Role:    * Rosslyn Dino HERO, MD - Primary  PHYSICIAN ASSISTANT:   ASSISTANTS: none   ANESTHESIA:   general  EBL:  100 mL   BLOOD ADMINISTERED:none  DRAINS: none   LOCAL MEDICATIONS USED:  LIDOCAINE    SPECIMEN:  Biopsy / Limited Resection  DISPOSITION OF SPECIMEN:  PATHOLOGY  COUNTS:  YES  TOURNIQUET:  * No tourniquets in log *  DICTATION: .Dragon Dictation  PLAN OF CARE: Admit to inpatient   PATIENT DISPOSITION:  ICU - extubated and stable.   Delay start of Pharmacological VTE agent (>24hrs) due to surgical blood loss or risk of bleeding: yes

## 2024-02-24 NOTE — Anesthesia Postprocedure Evaluation (Signed)
 Anesthesia Post Note  Patient: [name]  Procedure(s) Performed: RIGHT POSTERIOR FOSSA CRANIOTOMY TUMOR RESECTION (Right)     Patient location during evaluation: PACU Anesthesia Type: General Level of consciousness: awake and alert and oriented Pain management: pain level controlled Vital Signs Assessment: post-procedure vital signs reviewed and stable Respiratory status: spontaneous breathing, nonlabored ventilation and respiratory function stable Cardiovascular status: blood pressure returned to baseline and stable Postop Assessment: no apparent nausea or vomiting Anesthetic complications: no   No notable events documented.  Last Vitals:  Vitals:   02/24/24 1215 02/24/24 1230  BP:    Pulse: 72 (!) 59  Resp: 19 14  Temp:    SpO2: 95% 95%    Last Pain:  Vitals:   02/24/24 1212  PainSc: 9                  [place] Anagnos A.

## 2024-02-25 ENCOUNTER — Inpatient Hospital Stay (HOSPITAL_COMMUNITY)

## 2024-02-25 DIAGNOSIS — D333 Benign neoplasm of cranial nerves: Secondary | ICD-10-CM

## 2024-02-25 LAB — CBC
HCT: 40.6 % (ref 39.0–52.0)
Hemoglobin: 14.2 g/dL (ref 13.0–17.0)
MCH: 33 pg (ref 26.0–34.0)
MCHC: 35 g/dL (ref 30.0–36.0)
MCV: 94.4 fL (ref 80.0–100.0)
Platelets: 218 K/uL (ref 150–400)
RBC: 4.3 MIL/uL (ref 4.22–5.81)
RDW: 13.6 % (ref 11.5–15.5)
WBC: 14.3 K/uL — ABNORMAL HIGH (ref 4.0–10.5)
nRBC: 0 % (ref 0.0–0.2)

## 2024-02-25 LAB — BASIC METABOLIC PANEL WITH GFR
Anion gap: 12 (ref 5–15)
BUN: 18 mg/dL (ref 8–23)
CO2: 19 mmol/L — ABNORMAL LOW (ref 22–32)
Calcium: 8.4 mg/dL — ABNORMAL LOW (ref 8.9–10.3)
Chloride: 107 mmol/L (ref 98–111)
Creatinine, Ser: 0.94 mg/dL (ref 0.61–1.24)
GFR, Estimated: >60 mL/min (ref >60)
Glucose, Bld: 133 mg/dL — ABNORMAL HIGH (ref 70–99)
Potassium: 3.9 mmol/L (ref 3.5–5.1)
Sodium: 138 mmol/L (ref 135–145)

## 2024-02-25 LAB — GLUCOSE, CAPILLARY
Glucose-Capillary: 133 mg/dL — ABNORMAL HIGH (ref 70–99)
Glucose-Capillary: 146 mg/dL — ABNORMAL HIGH (ref 70–99)
Glucose-Capillary: 168 mg/dL — ABNORMAL HIGH (ref 70–99)
Glucose-Capillary: 212 mg/dL — ABNORMAL HIGH (ref 70–99)
Glucose-Capillary: 247 mg/dL — ABNORMAL HIGH (ref 70–99)
Glucose-Capillary: 251 mg/dL — ABNORMAL HIGH (ref 70–99)

## 2024-02-25 LAB — MRSA NEXT GEN BY PCR, NASAL: MRSA by PCR Next Gen: NOT DETECTED

## 2024-02-25 MED ORDER — BUTALBITAL-APAP-CAFFEINE 50-325-40 MG PO TABS: 2.0000 | ORAL_TABLET | ORAL | 0 refills | Status: DC | PRN

## 2024-02-25 MED ORDER — MECLIZINE HCL 25 MG PO TABS
25.0000 mg | ORAL_TABLET | Freq: Three times a day (TID) | ORAL | Status: DC | PRN
  Administered 2024-02-25 – 2024-02-26 (×2): 25 mg via ORAL
  Filled 2024-02-25 (×3): qty 1

## 2024-02-25 MED ORDER — BACLOFEN 10 MG PO TABS: 10.0000 mg | ORAL_TABLET | Freq: Three times a day (TID) | ORAL | 1 refills | Status: AC | PRN

## 2024-02-25 MED ORDER — POLYETHYLENE GLYCOL 3350 17 G PO PACK
17.0000 g | PACK | Freq: Every day | ORAL | Status: DC
  Administered 2024-02-25: 17 g via ORAL
  Filled 2024-02-25 (×2): qty 1

## 2024-02-25 MED ORDER — ONDANSETRON 4 MG PO TBDP: 4.0000 mg | ORAL_TABLET | Freq: Three times a day (TID) | ORAL | 0 refills | Status: DC | PRN

## 2024-02-25 MED ORDER — PANTOPRAZOLE SODIUM 40 MG PO TBEC
40.0000 mg | DELAYED_RELEASE_TABLET | Freq: Every day | ORAL | Status: DC
  Administered 2024-02-25: 40 mg via ORAL
  Filled 2024-02-25: qty 1

## 2024-02-25 MED ORDER — GADOBUTROL 1 MMOL/ML IV SOLN
10.0000 mL | Freq: Once | INTRAVENOUS | Status: AC | PRN
Start: 2024-02-25 — End: 2024-02-25
  Administered 2024-02-25: 10 mL via INTRAVENOUS

## 2024-02-25 NOTE — Evaluation (Signed)
 Physical Therapy Evaluation Patient Details Name: [name] MRN: [id number] DOB: Sep 29, 1955 Today's Date: 02/25/2024  History of Present Illness  68 yo male s/p R retrosigmoid craniectomy for posterior fossa tumor, resection, cranioplasty 8/25. Per MRI brain 8/26, 12 x 27 x 15 mm  residual tumor within Meckel's cave. 6 x 14 mm residual tumor in the  right prepontine cistern. PMH includes anxiety, OA, carpal tunnel R wrist, depression, GERD, headache, HTN, obesity, sleep apnea,  Clinical Impression   Pt presents with L beating constant horizontal nystagmus resulting in room-spinning dizziness at rest and with mobility, impaired balance, and decreased activity tolerance. Pt to benefit from acute PT to address deficits. Pt overall supervision for transfer-level mobility, but very limited by constant nystagmus and headache. RN notified. PT to progress mobility as tolerated, and will continue to follow acutely.          If plan is discharge home, recommend the following: A little help with walking and/or transfers;A little help with bathing/dressing/bathroom   Can travel by private vehicle        Equipment Recommendations Rolling walker (2 wheels)  Recommendations for Other Services       Functional Status Assessment Patient has had a recent decline in their functional status and demonstrates the ability to make significant improvements in function in a reasonable and predictable amount of time.     Precautions / Restrictions Precautions Precautions: Fall Precaution/Restrictions Comments: L horizontal nystagmus, constant Restrictions Weight Bearing Restrictions Per Provider Order: No      Mobility  Bed Mobility Overal bed mobility: Needs Assistance Bed Mobility: Supine to Sit, Sit to Supine, Rolling Rolling: Supervision   Supine to sit: Supervision Sit to supine: Supervision   General bed mobility comments: for safety, cues for technique but no physical assist     Transfers Overall transfer level: Needs assistance Equipment used: 1 person hand held assist Transfers: Sit to/from Stand Sit to Stand: Contact guard assist           General transfer comment: for safety, x1 lateral step towards HOB towards L but further mobility limited by room-spinning dizziness suspect related to nystagmus    Ambulation/Gait                  Stairs            Wheelchair Mobility     Tilt Bed    Modified Rankin (Stroke Patients Only)       Balance Overall balance assessment: Needs assistance Sitting-balance support: No upper extremity supported, Feet supported Sitting balance-Leahy Scale: Fair     Standing balance support: Single extremity supported, No upper extremity supported Standing balance-Leahy Scale: Fair                               Pertinent Vitals/Pain Pain Assessment Pain Assessment: Faces Faces Pain Scale: Hurts even more Pain Location: Head Pain Descriptors / Indicators: Headache Pain Intervention(s): Limited activity within patient's tolerance, Monitored during session, Repositioned    Home Living Family/patient expects to be discharged to:: Private residence Living Arrangements: Alone Available Help at Discharge: Friend(s) Type of Home: House Home Access: Stairs to enter Entrance Stairs-Rails: Can reach both Entrance Stairs-Number of Steps: 5 Alternate Level Stairs-Number of Steps: 12 Home Layout: Two level Home Equipment: Cane - single point Additional Comments: Uses cane when he had surgery on foot    Prior Function Prior Level of Function : Independent/Modified Independent  Mobility Comments: has a dog, friends can assist him as needed once d/c       Extremity/Trunk Assessment   Upper Extremity Assessment Upper Extremity Assessment: Defer to OT evaluation    Lower Extremity Assessment Lower Extremity Assessment: Overall WFL for tasks assessed    Cervical /  Trunk Assessment Cervical / Trunk Assessment: Normal  Communication   Communication Communication: No apparent difficulties    Cognition Arousal: Alert Behavior During Therapy: Flat affect   PT - Cognitive impairments: Problem solving                         Following commands: Impaired Following commands impaired: Follows one step commands with increased time     Cueing Cueing Techniques: Verbal cues, Gestural cues     General Comments      Exercises     Assessment/Plan    PT Assessment Patient needs continued PT services  PT Problem List Decreased balance;Decreased knowledge of precautions;Decreased activity tolerance;Decreased safety awareness       PT Treatment Interventions DME instruction;Therapeutic activities;Gait training;Therapeutic exercise;Patient/family education;Balance training;Stair training;Functional mobility training;Neuromuscular re-education    PT Goals (Current goals can be found in the Care Plan section)  Acute Rehab PT Goals Patient Stated Goal: back home with dog PT Goal Formulation: With patient Time For Goal Achievement: 03/10/24 Potential to Achieve Goals: Good    Frequency Min 2X/week     Co-evaluation               AM-PAC PT 6 Clicks Mobility  Outcome Measure Help needed turning from your back to your side while in a flat bed without using bedrails?: A Little Help needed moving from lying on your back to sitting on the side of a flat bed without using bedrails?: A Little Help needed moving to and from a bed to a chair (including a wheelchair)?: A Little Help needed standing up from a chair using your arms (e.g., wheelchair or bedside chair)?: A Little Help needed to walk in hospital room?: A Lot Help needed climbing 3-5 steps with a railing? : Total 6 Click Score: 15    End of Session   Activity Tolerance: Treatment limited secondary to medical complications (Comment) (nystagmus at rest, room-spinning dizziness  that is constant) Patient left: in bed;with call bell/phone within reach;with bed alarm set;with nursing/sitter in room Nurse Communication: Mobility status PT Visit Diagnosis: Other abnormalities of gait and mobility (R26.89);Dizziness and giddiness (R42);Pain Pain - part of body:  (head)    Time: 8396-8379 PT Time Calculation (min) (ACUTE ONLY): 17 min   Charges:   PT Evaluation $PT Eval Low Complexity: 1 Low   PT General Charges $$ ACUTE PT VISIT: 1 Visit         Johana RAMAN, PT DPT Acute Rehabilitation Services Secure Chat Preferred  Office [phone]   Johana FORBES [place] 02/25/2024, 4:43 PM

## 2024-02-25 NOTE — Discharge Instructions (Addendum)
 Continue home medications except for baby aspirin , can resume baby aspirin  after 1 week Continue your prior to admission pain meds plus baclofen  and/or fioricet  for neck pain, headache and muscle pain around surgical site.  Do not exceed 4 grams of tylenol  (acetaminophen ) per day Zofran  (ondansetron ) for nausea has been prescribed, if you notice that you need it more than once please let Dr. Catarino office know Take a bowel regimen so that you are not straining, some combination of miralax , senna, prune juice, or whatever works for you at home No showering or washing hair or getting incision wet until you see Dr. Rosslyn in clinic Not uncommon to have swelling to the side of the surgery; stay upright during day if able, can use a wrapped ice pack for 10-15 minutes at a time Monitor incision site drainage, notify Dr. Catarino office if increasing drainage or looks different For your eye, use lubricating eye drops during day and can either tape eye at night or purchase over the counter cloth eye patch to apply at night

## 2024-02-25 NOTE — TOC CM/SW Note (Signed)
 Transition of Care [facility]) - Inpatient Brief Assessment   Patient Details  Name: [name] MRN: [id number] Date of Birth: 02-22-1956  Transition of Care [facility]) CM/SW Contact:    Josepha Mliss HERO, RN Phone Number: 02/25/2024, 5:27 PM   Clinical Narrative: 68 yo male s/p R retrosigmoid craniectomy for posterior fossa tumor, resection, cranioplasty 8/25. Per MRI brain 8/26, 12 x 27 x 15 mm residual tumor within Meckel's cave. 6 x 14 mm residual tumor in the right prepontine cistern.  PTA, pt independent and living at home alone.  PT recommending OP f/u and RW; await OT eval for additional recommendations.    Transition of Care Asessment: Insurance and Status: Insurance coverage has been reviewed Patient has primary care physician: Yes Dr. Joyce Home environment has been reviewed: Lives alone; has friends to assist prn Prior level of function:: Independent Prior/Current Home Services: No current home services Social Drivers of Health Review: SDOH reviewed no interventions necessary Readmission risk has been reviewed: Yes Transition of care needs: no transition of care needs at this time   Mliss MICAEL Josepha, RN, BSN  Trauma/Neuro ICU Case Manager [phone]

## 2024-02-25 NOTE — Discharge Summary (Addendum)
 Addendum: still dizzy and cannot walk more than 50 ft, some nystagmus, would prefer to wait an additional day. Have added meclizine , PT, OT consult.  Hopefully better tomorrow symptomatically.  Physician Discharge Summary  Patient ID: [name] MRN: [id number] DOB/AGE: 66-May-1957 66 y.o.  Admit date: 02/24/2024 Discharge date: 02/25/2024  Admission Diagnoses: Right trigeminal schwannoma  Discharge Diagnoses:  Same   Discharged Condition: good  Hospital Course:  Patient admitted on 02/24/2024 for  1.         RIGHT retrosigmoid craniectomy for posterior fossa tumor (38479) 2.         RIGHT posterior fossa resection, intradural (38383) 3.Cranioplasty, > 5 cm (62140) 4.Operative navigation, image-based (38217) 5.Microdissection (69990) 6.Cranial nerve monitoring (04132) 7.Duraplasty >5cm  Procedure uncomplicated. Postop MRI and exam showed no complications. Ambulating, pain controlled, tolerating diet, and voiding at time of discharge. Will follow up in NSGY clinic with instructions provided to his caregiver as below.  Discharge Exam: Blood pressure 120/61, pulse 69, temperature 98.6 F (37 C), temperature source Oral, resp. rate 19, height 5' 11 (1.803 m), weight 98.4 kg, SpO2 97%.  No distress R posterior scalp Incision site CDI without discharge Heart sounds regular, ext warm Moves to command Ongoing issues with hearing out of R ear stable RASS 0    Discharge Instructions      Continue home medications except for baby aspirin , can resume baby aspirin  after 1 week Continue your prior to admission pain meds plus baclofen  and/or fioricet  for neck pain, headache and muscle pain around surgical site.  Do not exceed 4 grams of tylenol  (acetaminophen ) per day Zofran  (ondansetron ) for nausea has been prescribed, if you notice that you need it more than once please let Dr. Catarino office know Take a bowel regimen so that you are not straining, some combination of  miralax , senna, prune juice, or whatever works for you at home No showering or washing hair or getting incision wet until you see Dr. Rosslyn in clinic Not uncommon to have swelling to the side of the surgery; stay upright during day if able, can use a wrapped ice pack for 10-15 minutes at a time Monitor incision site drainage, notify Dr. Catarino office if increasing drainage or looks different For your eye, use lubricating eye drops during day and can either tape eye at night or purchase over the counter cloth eye patch to apply at night      Disposition:    Allergies as of 02/25/2024       Reactions   Hydrogen Peroxide    Peeling of gums per patient    Latex Itching        Medication List     STOP taking these medications    Aspirin  Low Dose 81 MG tablet Generic drug: aspirin  EC       TAKE these medications    acetaminophen  500 MG tablet Commonly known as: TYLENOL  Take 500-1,000 mg by mouth every 6 (six) hours as needed (pain.).   Azelastine  HCl 137 MCG/SPRAY Soln Place 1 spray into both nostrils daily.   B-complex with vitamin C tablet Take 1 tablet by mouth 2 (two) times daily.   baclofen  10 MG tablet Commonly known as: LIORESAL  Take 1 tablet (10 mg total) by mouth 3 (three) times daily as needed for up to 20 days for muscle spasms.   buPROPion  200 MG 12 hr tablet Commonly known as: WELLBUTRIN  SR Take 1 tablet (200 mg total) by mouth 2 (two) times daily.  butalbital -acetaminophen -caffeine  50-325-40 MG tablet Commonly known as: FIORICET  Take 2 tablets by mouth every 4 (four) hours as needed for headache (Moderate pain).   CALCIUM  CITRATE-VITAMIN D PO Take 1 tablet by mouth 2 (two) times daily.   celecoxib  100 MG capsule Commonly known as: CELEBREX  TAKE 1 CAPSULE BY MOUTH TWICE  DAILY   cetirizine 10 MG tablet Commonly known as: ZYRTEC Take 10 mg by mouth in the morning.   cyanocobalamin 1000 MCG tablet Commonly known as: VITAMIN B12 Take 1,000  mcg by mouth in the morning.   Denta 5000 Plus 1.1 % Crea dental cream Generic drug: sodium fluoride Place 1 Application onto teeth in the morning and at bedtime. BRUSH WITH PEASIZED AMOUNT TWICE DAILY THEN SPIT OUT, NO FOOD/DRINK FOR 30 MINUTES AFTER   desvenlafaxine  50 MG 24 hr tablet Commonly known as: PRISTIQ  2 qam   fluticasone  50 MCG/ACT nasal spray Commonly known as: FLONASE  USE 1 SPRAY NASALLY ONCE A  DAY FOR ALLERGIES What changed:  how much to take how to take this when to take this additional instructions   FreeStyle Libre 2 Reader [facility] Scan as needed for continuous glucose monitoring.   FreeStyle Libre 2 Sensor Misc Now on [employer] 3   gabapentin  300 MG capsule Commonly known as: NEURONTIN  Take 1 capsule (300 mg) by mouth in the morning, take 1 capsule (300 mg) by mouth with supper & take 2 capsules (600 mg) by mouth at bedtime.   Insulin  Pen Needle 32G X 4 MM Misc Use to inject insulin  once a day or as directed   INSULIN  SYRINGE 1CC/31GX5/16 31G X 5/16 1 ML Misc   Iron  Slow Release 142 (45 Fe) MG Tbcr Generic drug: Ferrous Sulfate  Take 1 tablet by mouth daily.   Jardiance 25 MG Tabs tablet Generic drug: empagliflozin Take 25 mg by mouth in the morning.   Lantus SoloStar 100 UNIT/ML Solostar Pen Generic drug: insulin  glargine Inject 12 Units into the skin in the morning.   levocetirizine 5 MG tablet Commonly known as: XYZAL  TAKE 1 TABLET BY MOUTH AT  BEDTIME   lisinopril  10 MG tablet Commonly known as: ZESTRIL  Take 1 tablet (10 mg total) by mouth daily.   metFORMIN 1000 MG tablet Commonly known as: GLUCOPHAGE Take 1,000 mg by mouth 2 (two) times daily with a meal.   montelukast  10 MG tablet Commonly known as: SINGULAIR  Take 1 tablet (10 mg total) by mouth at bedtime.   multivitamin with minerals Tabs tablet Take 1 tablet by mouth in the morning and at bedtime.   ondansetron  4 MG disintegrating tablet Commonly known as:  ZOFRAN -ODT Take 1 tablet (4 mg total) by mouth every 8 (eight) hours as needed for nausea or vomiting.   OneTouch Delica Lancets 33G Misc   OneTouch Verio test strip Generic drug: glucose blood   glucose blood test strip USE TO CHECK SUGAR 4 TIMES  DAILY.   pilocarpine 5 MG tablet Commonly known as: SALAGEN Take 5 mg by mouth 3 (three) times daily.   rosuvastatin  20 MG tablet Commonly known as: CRESTOR  TAKE 1 TABLET BY MOUTH DAILY What changed: when to take this   Systane 0.4-0.3 % Soln Generic drug: Polyethyl Glycol-Propyl Glycol Place 1 drop into both eyes 3 (three) times daily as needed (dry/irritated eyes.).   tadalafil  20 MG tablet Commonly known as: Cialis  Take 1 tablet (20 mg total) by mouth daily as needed for erectile dysfunction.   traZODone  100 MG tablet Commonly known as: DESYREL  Take  100 mg by mouth at bedtime as needed for sleep.        Follow-up Information     Rosslyn Dino HERO, MD Follow up in 7 day(s).   Specialty: Neurosurgery Why: His office will notify you of appt date/time Contact information: [address] [place] [address] [place] [address] [phone]                 Signed: Toribio JAYSON Sharps 02/25/2024, 12:55 PM

## 2024-02-26 ENCOUNTER — Ambulatory Visit: Admitting: Radiation Oncology

## 2024-02-26 LAB — GLUCOSE, CAPILLARY
Glucose-Capillary: 147 mg/dL — ABNORMAL HIGH (ref 70–99)
Glucose-Capillary: 220 mg/dL — ABNORMAL HIGH (ref 70–99)

## 2024-02-26 MED ORDER — POLYETHYLENE GLYCOL 3350 17 G PO PACK: 17.0000 g | PACK | Freq: Every day | ORAL | 0 refills | Status: DC

## 2024-02-26 MED ORDER — MECLIZINE HCL 25 MG PO TABS: 25.0000 mg | ORAL_TABLET | Freq: Three times a day (TID) | ORAL | 0 refills | Status: DC | PRN

## 2024-02-26 NOTE — Discharge Summary (Signed)
 Addendum: still dizzy and cannot walk more than 50 ft, some nystagmus, would prefer to wait an additional day. Have added meclizine , PT, OT consult.  Hopefully better tomorrow symptomatically.  Physician Discharge Summary  Patient ID: [name] MRN: [id number] DOB/AGE: 02/28/1956 68 y.o.  Admit date: 02/24/2024 Discharge date: 02/26/2024  Admission Diagnoses: Right trigeminal schwannoma  Discharge Diagnoses:  Same   Discharged Condition: good  Hospital Course:  Patient admitted on 02/24/2024 for  1.         RIGHT retrosigmoid craniectomy for posterior fossa tumor (38479) 2.         RIGHT posterior fossa resection, intradural (38383) 3.Cranioplasty, > 5 cm (62140) 4.Operative navigation, image-based (38217) 5.Microdissection (69990) 6.Cranial nerve monitoring (04132) 7.Duraplasty >5cm  Procedure uncomplicated. Postop MRI and exam showed no complications. Ambulating, pain controlled, tolerating diet, and voiding at time of discharge. Will follow up in NSGY clinic with instructions provided to his caregiver as below. 8/26 Completed decadron  taper. was to go home but was dizzy during ambulation and could not walk > 11ft. We did see some nystagmus. Added meclizine , asked PT and OT to evaluate. Delayed dc. PT evaluated, activity tolerance severely limited due to room-spinning and dizziness. Recommended PT twice a week. 8/27 ambulated 3 times around unit later in the evening. Still requiring PRN analgesia. Dizziness better. Seen By Dr Rosslyn and cleared for discharge   Discharge Exam: Blood pressure 114/68, pulse (!) 57, temperature 98.4 F (36.9 C), temperature source Oral, resp. rate 15, height 5' 11 (1.803 m), weight 98.4 kg, SpO2 90%.  General sitting up in bed no distress  HENT NCAT no JVD  Neuro equal st. Nystagmus improved. Denies dizziness. Still can't read. Equal st bilaterally  Pulm clear Card rrr Abd soft Ext warm  Gu voids     Discharge Instructions       Continue home medications except for baby aspirin , can resume baby aspirin  after 1 week Continue your prior to admission pain meds plus baclofen  and/or fioricet  for neck pain, headache and muscle pain around surgical site.  Do not exceed 4 grams of tylenol  (acetaminophen ) per day Zofran  (ondansetron ) for nausea has been prescribed, if you notice that you need it more than once please let Dr. Catarino office know Take a bowel regimen so that you are not straining, some combination of miralax , senna, prune juice, or whatever works for you at home No showering or washing hair or getting incision wet until you see Dr. Rosslyn in clinic (10-14d)  Not uncommon to have swelling to the side of the surgery; stay upright during day if able, can use a wrapped ice pack for 10-15 minutes at a time Monitor incision site drainage, notify Dr. Catarino office if increasing drainage or looks different For your eye, use lubricating eye drops during day and can either tape eye at night or purchase over the counter cloth eye patch to apply at night      Disposition: home   Allergies as of 02/26/2024       Reactions   Hydrogen Peroxide    Peeling of gums per patient    Latex Itching        Medication List     STOP taking these medications    Aspirin  Low Dose 81 MG tablet Generic drug: aspirin  EC       TAKE these medications    acetaminophen  500 MG tablet Commonly known as: TYLENOL  Take 500-1,000 mg by mouth every 6 (six) hours as needed (pain.).  Azelastine  HCl 137 MCG/SPRAY Soln Place 1 spray into both nostrils daily.   B-complex with vitamin C tablet Take 1 tablet by mouth 2 (two) times daily.   baclofen  10 MG tablet Commonly known as: LIORESAL  Take 1 tablet (10 mg total) by mouth 3 (three) times daily as needed for up to 20 days for muscle spasms.   buPROPion  200 MG 12 hr tablet Commonly known as: WELLBUTRIN  SR Take 1 tablet (200 mg total) by mouth 2 (two) times daily.    butalbital -acetaminophen -caffeine  50-325-40 MG tablet Commonly known as: FIORICET  Take 2 tablets by mouth every 4 (four) hours as needed for headache (Moderate pain).   CALCIUM  CITRATE-VITAMIN D PO Take 1 tablet by mouth 2 (two) times daily.   celecoxib  100 MG capsule Commonly known as: CELEBREX  TAKE 1 CAPSULE BY MOUTH TWICE  DAILY   cetirizine 10 MG tablet Commonly known as: ZYRTEC Take 10 mg by mouth in the morning.   cyanocobalamin 1000 MCG tablet Commonly known as: VITAMIN B12 Take 1,000 mcg by mouth in the morning.   Denta 5000 Plus 1.1 % Crea dental cream Generic drug: sodium fluoride Place 1 Application onto teeth in the morning and at bedtime. BRUSH WITH PEASIZED AMOUNT TWICE DAILY THEN SPIT OUT, NO FOOD/DRINK FOR 30 MINUTES AFTER   desvenlafaxine  50 MG 24 hr tablet Commonly known as: PRISTIQ  2 qam   fluticasone  50 MCG/ACT nasal spray Commonly known as: FLONASE  USE 1 SPRAY NASALLY ONCE A  DAY FOR ALLERGIES What changed:  how much to take how to take this when to take this additional instructions   FreeStyle Libre 2 Reader [facility] Scan as needed for continuous glucose monitoring.   FreeStyle Libre 2 Sensor Misc Now on [employer] 3   gabapentin  300 MG capsule Commonly known as: NEURONTIN  Take 1 capsule (300 mg) by mouth in the morning, take 1 capsule (300 mg) by mouth with supper & take 2 capsules (600 mg) by mouth at bedtime.   Insulin  Pen Needle 32G X 4 MM Misc Use to inject insulin  once a day or as directed   INSULIN  SYRINGE 1CC/31GX5/16 31G X 5/16 1 ML Misc   Iron  Slow Release 142 (45 Fe) MG Tbcr Generic drug: Ferrous Sulfate  Take 1 tablet by mouth daily.   Jardiance 25 MG Tabs tablet Generic drug: empagliflozin Take 25 mg by mouth in the morning.   Lantus SoloStar 100 UNIT/ML Solostar Pen Generic drug: insulin  glargine Inject 12 Units into the skin in the morning.   levocetirizine 5 MG tablet Commonly known as: XYZAL  TAKE 1 TABLET BY  MOUTH AT  BEDTIME   lisinopril  10 MG tablet Commonly known as: ZESTRIL  Take 1 tablet (10 mg total) by mouth daily.   meclizine  25 MG tablet Commonly known as: ANTIVERT  Take 1 tablet (25 mg total) by mouth 3 (three) times daily as needed for dizziness.   metFORMIN 1000 MG tablet Commonly known as: GLUCOPHAGE Take 1,000 mg by mouth 2 (two) times daily with a meal.   montelukast  10 MG tablet Commonly known as: SINGULAIR  Take 1 tablet (10 mg total) by mouth at bedtime.   multivitamin with minerals Tabs tablet Take 1 tablet by mouth in the morning and at bedtime.   ondansetron  4 MG disintegrating tablet Commonly known as: ZOFRAN -ODT Take 1 tablet (4 mg total) by mouth every 8 (eight) hours as needed for nausea or vomiting.   OneTouch Delica Lancets 33G Misc   OneTouch Verio test strip Generic drug: glucose blood  glucose blood test strip USE TO CHECK SUGAR 4 TIMES  DAILY.   pilocarpine 5 MG tablet Commonly known as: SALAGEN Take 5 mg by mouth 3 (three) times daily.   polyethylene glycol 17 g packet Commonly known as: MIRALAX  / GLYCOLAX  Take 17 g by mouth daily.   rosuvastatin  20 MG tablet Commonly known as: CRESTOR  TAKE 1 TABLET BY MOUTH DAILY What changed: when to take this   Systane 0.4-0.3 % Soln Generic drug: Polyethyl Glycol-Propyl Glycol Place 1 drop into both eyes 3 (three) times daily as needed (dry/irritated eyes.).   tadalafil  20 MG tablet Commonly known as: Cialis  Take 1 tablet (20 mg total) by mouth daily as needed for erectile dysfunction.   traZODone  100 MG tablet Commonly known as: DESYREL  Take 100 mg by mouth at bedtime as needed for sleep.         Follow-up Information     Rosslyn Dino HERO, MD Follow up in 7 day(s).   Specialty: Neurosurgery Why: His office will notify you of appt date/time Contact information: [address] [place] [address] [place] [address] [phone]                 Signed: Jeralyn FORBES Banner 02/26/2024, 7:54 AM

## 2024-02-26 NOTE — Progress Notes (Incomplete)
 NAME:  [name], MRN:  [id number], DOB:  1955-12-11, LOS: 2 ADMISSION DATE:  02/24/2024, CONSULTATION DATE:  8/25 REFERRING MD:  Rosslyn, CHIEF COMPLAINT:  Post-op crani   History of Present Illness:  68 year old male with past medical history as below, which is significant for diabetes mellitus, hyperlipidemia, hypertension, sleep apnea.  Was seen by neurosurgery with a short history of right sided facial numbness and in symptoms along with trigeminal neuralgia.  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  Based on tumor growth it was decided to forego trial of steroids and proceed to resection with biopsy.  He presented to [facility] 8/25 and underwent right posterior fossa craniotomy and trigeminal tumor resection.  PCCM is asked to assist with medical management in the postoperative setting  Pertinent  Medical History   has a past medical history of Anxiety, Arthritis, Candidiasis, mouth (03/22/2016), Carpal tunnel syndrome of right wrist (10/2014), Dental crowns present, Depression, Dyslipidemia, Ear fullness (08/30/2016), GERD (gastroesophageal reflux disease), Headache, Hypertension, Insulin  dependent diabetes mellitus, Nummular eczema, Obesity, Onychomycosis (08/30/2016), Recurrent infections (03/22/2016), and Sleep apnea.   Significant Hospital Events: Including procedures, antibiotic start and stop dates in addition to other pertinent events   8/25 R posterior fossa craniotomy for trigeminal tumor resection and biopsy.  8/26 was to go home but was dizzy during ambulation and could not walk > 47ft. We did see some nystagmus. Added meclizine , asked PT and OT to evaluate. Delayed dc. PT evaluated, activity tolerance severely limited due to room-spinning and dizziness. Recommended PT twice a week. 8/27   Interim History / Subjective:    Objective    Blood pressure 114/68, pulse (!) 57, temperature  98.4 F (36.9 C), temperature source Oral, resp. rate 15, height 5' 11 (1.803 m), weight 98.4 kg, SpO2 90%.        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 8/25 Plan Continued PT BID  - Management per neurosurgery - Decadron  - Multimodal pain management with Tylenol , Fioricet , fentanyl  - perioperative ancef  - Gabapentin  per home regimen  Hypertension HLD - Continue home lisinopril , statin - Cleviprex  to keep  SBP < 160 mmHg  DM - CBG monitoring and SSI - Holding home jardiance, metformin - Holding lantus until taking PO  MDD - continue home bupropion , venlafaxine   OSA on CPAP:  - hold CPAP due to nausea   Best Practice (right click and Reselect all SmartList Selections daily)   Diet/type: Regular consistency (see orders) DVT prophylaxis not indicated - defer initially post-op Pressure ulcer(s): pressure ulcer assessment deferred  GI prophylaxis: PPI Lines: N/A Foley:  N/A Code Status:  full code Last date of multidisciplinary goals of care discussion []   Labs   CBC: Recent Labs  Lab 02/19/24 1500 02/25/24 0550  WBC 8.3 14.3*  HGB 14.8 14.2  HCT 44.3 40.6  MCV 94.9 94.4  PLT 235 218    Basic Metabolic Panel: Recent Labs  Lab 02/19/24 1500 02/25/24 0550  NA 140 138  K 4.3 3.9  CL 104 107  CO2 25 19*  GLUCOSE 120* 133*  BUN  14 18  CREATININE 0.79 0.94  CALCIUM  9.2 8.4*   GFR: Estimated Creatinine Clearance: 92.4 mL/min (by C-G formula based on SCr of 0.94 mg/dL). Recent Labs  Lab 02/19/24 1500 02/25/24 0550  WBC 8.3 14.3*    Liver Function Tests: No results for input(s): AST, ALT, ALKPHOS, BILITOT, PROT, ALBUMIN in the last 168  hours. No results for input(s): LIPASE, AMYLASE in the last 168 hours. No results for input(s): AMMONIA in the last 168 hours.  ABG    Component Value Date/Time   PHART 7.361 12/30/2006 1527   PCO2ART 42.7 12/30/2006 1527   PO2ART 55.0 (L) 12/30/2006 1527   HCO3 25.1 (H) 12/30/2006 1533   TCO2 23 05/19/2015 0730   ACIDBASEDEF 1.0 12/30/2006 1533   O2SAT 63.0 12/30/2006 1533     Coagulation Profile: No results for input(s): INR, PROTIME in the last 168 hours.  Cardiac Enzymes: No results for input(s): CKTOTAL, CKMB, CKMBINDEX, TROPONINI in the last 168 hours.  HbA1C: Hemoglobin A1C  Date/Time Value Ref Range Status  04/10/2022 12:00 AM 5.2  Final  11/17/2021 12:00 AM 6.6  Final   Hgb A1c MFr Bld  Date/Time Value Ref Range Status  02/19/2024 03:00 PM 6.5 (H) 4.8 - 5.6 % Final    Comment:    (NOTE) Diagnosis of Diabetes The following HbA1c ranges recommended by the American Diabetes Association (ADA) may be used as an aid in the diagnosis of diabetes mellitus.  Hemoglobin             Suggested A1C NGSP%              Diagnosis  <5.7                   Non Diabetic  5.7-6.4                Pre-Diabetic  >6.4                   Diabetic  <7.0                   Glycemic control for                       adults with diabetes.      CBG: Recent Labs  Lab 02/25/24 1109 02/25/24 1554 02/25/24 1949 02/25/24 2350 02/26/24 0354  GLUCAP 168* 251* 247* 212* 147*    Review of Systems:   Bolds are positive  Constitutional: weight loss, gain, night sweats, Fevers, chills, fatigue .  HEENT: headaches, Sore throat, sneezing, nasal congestion, post nasal drip, Difficulty swallowing, Tooth/dental problems, visual complaints visual changes, ear ache CV:  chest pain, radiates:,Orthopnea, PND, swelling in lower extremities, dizziness, palpitations, syncope.  GI  heartburn, indigestion, abdominal pain, nausea, vomiting, diarrhea, change in bowel habits, loss of  appetite, bloody stools.  Resp: cough, productive: , hemoptysis, dyspnea, chest pain, pleuritic.  Skin: rash or itching or icterus GU: dysuria, change in color of urine, urgency or frequency. flank pain, hematuria  MS: joint pain or swelling. decreased range of motion  Psych: change in mood or affect. depression or anxiety.  Neuro: difficulty with speech, weakness, numbness, ataxia    Past Medical History:  He,  has a past medical history of Anxiety, Arthritis, Candidiasis, mouth (03/22/2016), Carpal tunnel syndrome of right wrist (10/2014), Dental crowns present, Depression, Dyslipidemia, Ear fullness (08/30/2016), GERD (gastroesophageal reflux disease), Headache, Hypertension, Insulin  dependent diabetes mellitus, Nummular eczema, Obesity, Onychomycosis (08/30/2016), Recurrent  infections (03/22/2016), and Sleep apnea.   Surgical History:   Past Surgical History:  Procedure Laterality Date   CARDIAC CATHETERIZATION  12/30/2006   mod. pulmonary HTN with preserved cardiac output and cardiac index   CARPAL TUNNEL RELEASE Right 10/14/2014   Procedure: RIGHT CARPAL TUNNEL RELEASE;  Surgeon: Franky Curia, MD;  Location: [facility];  Service: Orthopedics;  Laterality: Right;   CARPAL TUNNEL RELEASE Left 05/19/2015   Procedure: LEFT CARPAL TUNNEL RELEASE;  Surgeon: Franky Curia, MD;  Location: [facility];  Service: Orthopedics;  Laterality: Left;   COLONOSCOPY     CYST EXCISION     scalp and back   ESOPHAGOGASTRODUODENOSCOPY  03/25/2007   FOOT SURGERY Left    revision traumatic amputation of foot x5   ROUX-EN-Y PROCEDURE  07/28/2007   with lysis of adhesions     Social History:   reports that he quit smoking about 16 years ago. His smoking use included cigarettes. He started smoking about 49 years ago. He has a 33 pack-year smoking history. He has never used smokeless tobacco. He reports that he does not currently use alcohol. He reports current drug use.  Frequency: 7.00 times per week. Drug: Marijuana.   Family History:  His family history includes Arthritis in his paternal grandmother; Cancer (age of onset: 1) in his mother; Diabetes in his brother, father, and paternal grandmother; Heart disease in his mother and paternal grandmother; Heart disease (age of onset: 30) in his brother; Heart disease (age of onset: 65) in his brother; Stroke in his paternal grandmother.   Allergies Allergies  Allergen Reactions   Hydrogen Peroxide     Peeling of gums per patient    Latex Itching     Home Medications  Prior to Admission medications   Medication Sig Start Date End Date Taking? Authorizing Provider  acetaminophen  (TYLENOL ) 500 MG tablet Take 500-1,000 mg by mouth every 6 (six) hours as needed (pain.).   Yes [provider]  Azelastine  HCl 137 MCG/SPRAY SOLN Place 1 spray into both nostrils daily. 09/18/22  Yes Joyce Norleen BROCKS, MD  B Complex-C (B-COMPLEX WITH VITAMIN C) tablet Take 1 tablet by mouth 2 (two) times daily.   Yes [provider]  buPROPion  (WELLBUTRIN  SR) 200 MG 12 hr tablet Take 1 tablet (200 mg total) by mouth 2 (two) times daily. 02/04/24  Yes Plovsky, Elna, MD  CALCIUM  CITRATE-VITAMIN D PO Take 1 tablet by mouth 2 (two) times daily.   Yes [provider]  celecoxib  (CELEBREX ) 100 MG capsule TAKE 1 CAPSULE BY MOUTH TWICE  DAILY 09/13/23  Yes Magnant, Charles L, PA-C  cetirizine (ZYRTEC) 10 MG tablet Take 10 mg by mouth in the morning.   Yes [provider]  Continuous Blood Gluc Sensor (FREESTYLE LIBRE 2 SENSOR) MISC Now on Freestyle Libre 3 03/21/21  Yes [provider]  cyanocobalamin (VITAMIN B12) 1000 MCG tablet Take 1,000 mcg by mouth in the morning.   Yes [provider]  DENTA 5000 PLUS 1.1 % CREA dental cream Place 1 Application onto teeth in the morning and at bedtime. BRUSH WITH PEASIZED AMOUNT TWICE DAILY THEN SPIT OUT, NO FOOD/DRINK FOR 30 MINUTES AFTER 04/22/20  Yes  [provider]  desvenlafaxine  (PRISTIQ ) 50 MG 24 hr tablet 2 qam 02/04/24  Yes Plovsky, Elna, MD  empagliflozin (JARDIANCE) 25 MG TABS tablet Take 25 mg by mouth in the morning. 12/09/23  Yes [provider]  Ferrous Sulfate  (IRON  SLOW RELEASE) 142 (  45 Fe) MG TBCR Take 1 tablet by mouth daily. 07/17/21  Yes Joyce Norleen BROCKS, MD  fluticasone  (FLONASE ) 50 MCG/ACT nasal spray USE 1 SPRAY NASALLY ONCE A  DAY FOR ALLERGIES Patient taking differently: Place 2 sprays into both nostrils every evening. 02/21/23  Yes Joyce Norleen BROCKS, MD  gabapentin  (NEURONTIN ) 300 MG capsule Take 1 capsule (300 mg) by mouth in the morning, take 1 capsule (300 mg) by mouth with supper & take 2 capsules (600 mg) by mouth at bedtime. 02/19/24  Yes Janjua, Rashid M, MD  insulin  glargine (LANTUS SOLOSTAR) 100 UNIT/ML Solostar Pen Inject 12 Units into the skin in the morning. 09/14/19  Yes [provider]  levocetirizine (XYZAL ) 5 MG tablet TAKE 1 TABLET BY MOUTH AT  BEDTIME 09/11/23  Yes Lalonde, John C, MD  lisinopril  (ZESTRIL ) 10 MG tablet Take 1 tablet (10 mg total) by mouth daily. 03/20/23  Yes Lalonde, John C, MD  metFORMIN (GLUCOPHAGE) 1000 MG tablet Take 1,000 mg by mouth 2 (two) times daily with a meal.   Yes [provider]  montelukast  (SINGULAIR ) 10 MG tablet Take 1 tablet (10 mg total) by mouth at bedtime. 03/20/23  Yes Lalonde, John C, MD  Multiple Vitamin (MULTIVITAMIN WITH MINERALS) TABS tablet Take 1 tablet by mouth in the morning and at bedtime.   Yes [provider]  AISHA PASTOR LANCETS 33G MISC  08/15/15  Yes [provider]  AISHA SINKS test strip  08/15/15  Yes [provider]  pilocarpine (SALAGEN) 5 MG tablet Take 5 mg by mouth 3 (three) times daily.   Yes [provider]  Polyethyl Glycol-Propyl Glycol (SYSTANE) 0.4-0.3 % SOLN Place 1 drop into both eyes 3 (three) times daily as needed (dry/irritated eyes.).   Yes [provider]   rosuvastatin  (CRESTOR ) 20 MG tablet TAKE 1 TABLET BY MOUTH DAILY Patient taking differently: Take 20 mg by mouth every evening. 10/04/23  Yes Joyce Norleen BROCKS, MD  tadalafil  (CIALIS ) 20 MG tablet Take 1 tablet (20 mg total) by mouth daily as needed for erectile dysfunction. 12/31/23  Yes Joyce Norleen BROCKS, MD  ASPIRIN  LOW DOSE 81 MG EC tablet TAKE 1 TABLET BY MOUTH AT  BEDTIME . SWALLOW WHOLE. 05/17/20   Joyce Norleen BROCKS, MD  Continuous Glucose Receiver (FREESTYLE LIBRE 2 READER) DEVI Scan as needed for continuous glucose monitoring. 08/29/22   [provider]  glucose blood test strip USE TO CHECK SUGAR 4 TIMES  DAILY. 11/18/18   [provider]  Insulin  Pen Needle 32G X 4 MM MISC Use to inject insulin  once a day or as directed 07/23/17   [provider]  Insulin  Syringe-Needle U-100 (INSULIN  SYRINGE 1CC/31GX5/16) 31G X 5/16 1 ML MISC  08/30/16   [provider]  traZODone  (DESYREL ) 100 MG tablet Take 100 mg by mouth at bedtime as needed for sleep.    [provider]     Critical care time:       02/26/2024 7:25 AM

## 2024-02-27 ENCOUNTER — Telehealth: Payer: Self-pay | Admitting: Neurosurgery

## 2024-02-27 ENCOUNTER — Encounter (HOSPITAL_COMMUNITY): Payer: Self-pay | Admitting: Neurosurgery

## 2024-02-27 ENCOUNTER — Telehealth: Payer: Self-pay

## 2024-02-27 LAB — POCT I-STAT 7, (LYTES, BLD GAS, ICA,H+H)
Acid-base deficit: 3 mmol/L — ABNORMAL HIGH (ref 0.0–2.0)
Acid-base deficit: 2 mmol/L (ref 0.0–2.0)
Bicarbonate: 22.6 mmol/L (ref 20.0–28.0)
Bicarbonate: 22.6 mmol/L (ref 20.0–28.0)
Calcium, Ion: 1.21 mmol/L (ref 1.15–1.40)
Calcium, Ion: 1.17 mmol/L (ref 1.15–1.40)
HCT: 42 % (ref 39.0–52.0)
HCT: 41 % (ref 39.0–52.0)
Hemoglobin: 14.3 g/dL (ref 13.0–17.0)
Hemoglobin: 13.9 g/dL (ref 13.0–17.0)
O2 Saturation: 97 %
O2 Saturation: 99 %
Potassium: 4 mmol/L (ref 3.5–5.1)
Potassium: 4.1 mmol/L (ref 3.5–5.1)
Sodium: 141 mmol/L (ref 135–145)
Sodium: 141 mmol/L (ref 135–145)
TCO2: 24 mmol/L (ref 22–32)
TCO2: 24 mmol/L (ref 22–32)
pCO2 arterial: 42.6 mmHg (ref 32–48)
pCO2 arterial: 37.3 mmHg (ref 32–48)
pH, Arterial: 7.331 — ABNORMAL LOW (ref 7.35–7.45)
pH, Arterial: 7.391 (ref 7.35–7.45)
pO2, Arterial: 103 mmHg (ref 83–108)
pO2, Arterial: 166 mmHg — ABNORMAL HIGH (ref 83–108)

## 2024-02-27 LAB — SURGICAL PATHOLOGY

## 2024-02-27 NOTE — Telephone Encounter (Signed)
 Dr. Janjua,  Patient is worried that he has not heard about the results of his biopsy.   He has a scheduled appointment with Rad Onc coming up that has him anxious. He wants to know if he has cancer since he has lost family members.   He asked to speak with Dr. Janjua. I offered him an appointment with Dr. Janjua to discuss so long as Dr. Rosslyn has the results back.

## 2024-02-27 NOTE — Telephone Encounter (Signed)
 Left voicemail for patient to call back.

## 2024-02-27 NOTE — Telephone Encounter (Signed)
 Patient called to follow up about medications because he said there antibiotic among them so he wanted to make sure nothing was forgot. Patient said he is feeling well. Patient also said that someone from the hospital(He said he believes they were from the hospital) had called to check on him this morning and told him about his appointment on 03/09/24 with Oncology and he said he wasn't sure why he had this appointment and that it worried him because of oncology normally being for cancer. Patient asked for a call back to follow up on this, call back [phone].

## 2024-02-27 NOTE — Transitions of Care (Post Inpatient/ED Visit) (Signed)
 Today's TOC FU Call Status: Today's TOC FU Call Status:: Successful TOC FU Call Completed TOC FU Call Complete Date: 02/27/24 Patient's Name and Date of Birth confirmed.  Transition Care Management Follow-up Telephone Call Date of Discharge: 02/26/24 Discharge Facility: [facility]) Type of Discharge: Inpatient Admission Primary Inpatient Discharge Diagnosis:: Right trigeminal schwannoma; s/p craniectomy How have you been since you were released from the hospital?: Better Any questions or concerns?: No  Items Reviewed: Did you receive and understand the discharge instructions provided?: Yes Medications obtained,verified, and reconciled?: Yes (Medications Reviewed) Any new allergies since your discharge?: No Dietary orders reviewed?: Yes Type of Diet Ordered:: none specific on discharge but patient watches diet for his T2DM and monitors blood sugar via FreeStyle Libre Do you have support at home?: Yes People in Home [RPT]: friend(s) Name of Support/Comfort Primary Source: Rosina Czar (Friend)  [phone] (Mobile)  Medications Reviewed Today: Medications Reviewed Today     Reviewed by Carolee Heron NOVAK, RN (Case Manager) on 02/27/24 at [phone]  Med List Status: <None>   Medication Order Taking? Sig Documenting Provider Last Dose Status Informant  acetaminophen  (TYLENOL ) 500 MG tablet [id number] Yes Take 500-1,000 mg by mouth every 6 (six) hours as needed (pain.). [provider]  Active Self  Azelastine  HCl 137 MCG/SPRAY SOLN [id number] Yes Place 1 spray into both nostrils daily. Joyce Norleen BROCKS, MD  Active Self  B Complex-C (B-COMPLEX WITH VITAMIN C) tablet [id number] Yes Take 1 tablet by mouth 2 (two) times daily. [provider]  Active Self  baclofen  (LIORESAL ) 10 MG tablet [id number] Yes Take 1 tablet (10 mg total) by mouth 3 (three) times daily as needed for up to 20 days for muscle spasms. Claudene Toribio BROCKS, MD  Active   buPROPion  (WELLBUTRIN  SR) 200 MG 12 hr tablet  [id number] Yes Take 1 tablet (200 mg total) by mouth 2 (two) times daily. Tasia Lung, MD  Active Self  butalbital -acetaminophen -caffeine  (FIORICET ) 50-325-40 MG tablet [id number] Yes Take 2 tablets by mouth every 4 (four) hours as needed for headache (Moderate pain). Claudene Toribio BROCKS, MD  Active   CALCIUM  CITRATE-VITAMIN D PO [id number] Yes Take 1 tablet by mouth 2 (two) times daily. [provider]  Active Self  celecoxib  (CELEBREX ) 100 MG capsule [id number] Yes TAKE 1 CAPSULE BY MOUTH TWICE  DAILY Magnant, Charles L, PA-C  Active Self  cetirizine (ZYRTEC) 10 MG tablet [id number] Yes Take 10 mg by mouth in the morning. [provider]  Active Self  Continuous Blood Gluc Sensor (FREESTYLE LIBRE 2 SENSOR) MISC [id number] Yes Now on [facility] 3 [provider]  Active Self  Continuous Glucose Receiver (FREESTYLE LIBRE 2 READER) DEVI [id number] Yes Scan as needed for continuous glucose monitoring. [provider]  Active Self  cyanocobalamin (VITAMIN B12) 1000 MCG tablet [id number] Yes Take 1,000 mcg by mouth in the morning. [provider]  Active Self  DENTA 5000 PLUS 1.1 % CREA dental cream [id number] Yes Place 1 Application onto teeth in the morning and at bedtime. BRUSH WITH PEASIZED AMOUNT TWICE DAILY THEN SPIT OUT, NO FOOD/DRINK FOR 30 MINUTES AFTER [provider]  Active Self  desvenlafaxine  (PRISTIQ ) 50 MG 24 hr tablet [id number] Yes 2 qam Plovsky, Gerald, MD  Active Self  empagliflozin (JARDIANCE) 25 MG TABS tablet [id number] Yes Take 25 mg by mouth in the morning. [provider]  Active Self  Ferrous Sulfate  (IRON  SLOW RELEASE) 142 (45 Fe) MG TBCR [id number] Yes Take 1 tablet by mouth daily.  Joyce Norleen BROCKS, MD  Active Self  fluticasone  (FLONASE ) 50 MCG/ACT nasal spray [id number] Yes USE 1 SPRAY NASALLY ONCE A  DAY FOR ALLERGIES  Patient taking differently: Place 2 sprays into both nostrils every evening.   Lalonde, John C, MD  Active  Self  gabapentin  (NEURONTIN ) 300 MG capsule [id number] Yes Take 1 capsule (300 mg) by mouth in the morning, take 1 capsule (300 mg) by mouth with supper & take 2 capsules (600 mg) by mouth at bedtime. Rosslyn Dino HERO, MD  Active   glucose blood test strip [id number] Yes USE TO CHECK SUGAR 4 TIMES  DAILY. [provider]  Active Self  insulin  glargine (LANTUS SOLOSTAR) 100 UNIT/ML Solostar Pen [id number] Yes Inject 12 Units into the skin in the morning. [provider]  Active Self           Med Note CLAUD, MICHEAL DASEN   Fri Feb 14, 2024 11:32 AM)    Insulin  Pen Needle 32G X 4 MM MISC [id number] Yes Use to inject insulin  once a day or as directed [provider]  Active Self  Insulin  Syringe-Needle U-100 (INSULIN  SYRINGE 1CC/31GX5/16) 31G X 5/16 1 ML MISC [id number] Yes  [provider]  Active Self  levocetirizine (XYZAL ) 5 MG tablet [id number] Yes TAKE 1 TABLET BY MOUTH AT  BEDTIME Joyce Norleen BROCKS, MD  Active Self  lisinopril  (ZESTRIL ) 10 MG tablet [id number] Yes Take 1 tablet (10 mg total) by mouth daily. Joyce Norleen BROCKS, MD  Active Self  meclizine  (ANTIVERT ) 25 MG tablet [id number] Yes Take 1 tablet (25 mg total) by mouth 3 (three) times daily as needed for dizziness. Jenna Maude BRAVO, NP  Active   metFORMIN (GLUCOPHAGE) 1000 MG tablet [id number] Yes Take 1,000 mg by mouth 2 (two) times daily with a meal. [provider]  Active Self  montelukast  (SINGULAIR ) 10 MG tablet [id number] Yes Take 1 tablet (10 mg total) by mouth at bedtime. Joyce Norleen BROCKS, MD  Active Self  Multiple Vitamin (MULTIVITAMIN WITH MINERALS) TABS tablet [id number] Yes Take 1 tablet by mouth in the morning and at bedtime. [provider]  Active Self  ondansetron  (ZOFRAN -ODT) 4 MG disintegrating tablet [id number] Yes Take 1 tablet (4 mg total) by mouth every 8 (eight) hours as needed for nausea or vomiting. Claudene Toribio BROCKS, MD  Active   [facility] LANCETS 33G [place] [id number] Yes   [provider]  Active Self           Med Note LANITA CORDELLA SQUIBB   Dju Apr 05, 2018  4:31 PM)    AISHA SINKS test strip [id number] Yes  [provider]  Active Self           Med Note LANITA CORDELLA SQUIBB   Dju Apr 05, 2018  4:31 PM)    pilocarpine (SALAGEN) 5 MG tablet [id number] Yes Take 5 mg by mouth 3 (three) times daily. [provider]  Active Self  Polyethyl Glycol-Propyl Glycol (SYSTANE) 0.4-0.3 % SOLN [id number] Yes Place 1 drop into both eyes 3 (three) times daily as needed (dry/irritated eyes.). [provider]  Active Self  polyethylene glycol (MIRALAX  / GLYCOLAX ) 17 g packet [id number] Yes Take 17 g by mouth daily. Jenna Maude BRAVO, NP  Active   rosuvastatin  (CRESTOR ) 20 MG tablet [id number] Yes TAKE 1 TABLET BY MOUTH DAILY  Patient taking differently: Take 20 mg by mouth every evening.   Lalonde, John C, MD  Active Self  tadalafil  (CIALIS ) 20  MG tablet [id number] Yes Take 1 tablet (20 mg total) by mouth daily as needed for erectile dysfunction. Joyce Norleen BROCKS, MD  Active Self  traZODone  (DESYREL ) 100 MG tablet [id number] Yes Take 100 mg by mouth at bedtime as needed for sleep. [provider]  Active Self           Med Note CLAUD, MICHEAL DASEN   Fri Feb 14, 2024 11:35 AM)              Home Care and Equipment/Supplies: Were Home Health Services Ordered?: No Any new equipment or medical supplies ordered?:  (Patient has and uses a cane.)  Functional Questionnaire: Do you need assistance with bathing/showering or dressing?: No Do you need assistance with meal preparation?: No Do you need assistance with eating?: No Do you have difficulty maintaining continence: No Do you need assistance with getting out of bed/getting out of a chair/moving?: No Do you have difficulty managing or taking your medications?: No  Follow up appointments reviewed: PCP Follow-up appointment confirmed?: Yes Date of PCP follow-up appointment?:  (Patient to  call to make HFU appointment after this was dicussed with patient and rationale given for appointment withon 14 days of discharge. Declined Care Guide Assistance.) Follow-up Provider: Norleen Joyce at [facility] Follow-up appointment confirmed?: Yes Date of Specialist follow-up appointment?: 03/09/24 Follow-Up Specialty Provider:: Neurosurgeon Do you need transportation to your follow-up appointment?: No Do you understand care options if your condition(s) worsen?:  (Reviewed all discharge instructions and what to call provider for on AVS including watching incision for infections/changes, and medications review/changes.)  SDOH Interventions Today    Flowsheet Row Most Recent Value  SDOH Interventions   Food Insecurity Interventions Intervention Not Indicated  Housing Interventions Intervention Not Indicated  Transportation Interventions Intervention Not Indicated, Patient Resources (Friends/Family)  Utilities Interventions Intervention Not Indicated  Health Literacy Interventions Intervention Not Indicated    Goals Addressed             This Visit's Progress    [facility] Transitions of Care (TOC) Care Plan       Problems:  Recent Hospitalization for treatment of Right trigeminal schwannoma Knowledge Deficit Related to post craniectomy for tumor resection and No Hospital Follow Up Provider appointment : Patient to call PCP provider today to request hospital follow up or HFU post discharge visit after the rationale was explained. Declined Care Guide Assistance.   Goal:  Over the next 30 days, the patient will not experience hospital readmission  Interventions:  Diabetes Interventions: Assessed patient's understanding of A1c goal: <6.5%: Patient latest value 6.5 on 02/19/24.  Reviewed medications with patient and discussed importance of medication adherence Discussed plans with patient for ongoing care management follow up and provided patient with  direct contact information for care management team Reviewed scheduled/upcoming provider appointments including: Making a PCP hospital follow up appointment Review of patient status, including review of consultants reports, relevant laboratory and other test results, and medications completed Assessed social determinant of health barriers Discussed daily monitoring of blood pressure three times a week and recording in phone or notebook to take to providers for trends versus spot checks in office visit.  Lab Results  Component Value Date   HGBA1C 6.5 (H) 02/19/2024   Hypertension Interventions: Last practice recorded BP readings:  BP Readings from Last 3 Encounters:  02/26/24 114/68  02/19/24 127/69  02/11/24 111/68   Most recent eGFR/CrCl:  Lab Results  Component Value Date   EGFR 99 10/29/2022  No components found for: CRCL  Evaluation of current treatment plan related to hypertension self management and patient's adherence to plan as established by provider Reviewed medications with patient and discussed importance of compliance Discussed plans with patient for ongoing care management follow up and provided patient with direct contact information for care management team Advised patient, providing education and rationale, to monitor blood pressure daily and record, calling PCP for findings outside established parameters Reviewed scheduled/upcoming provider appointments including:  Assessed social determinant of health barriers  Surgery ( s/p craniectomy for brain tunor): Evaluation of current treatment plan related to craniectomy surgery assessed patient/caregiver understanding of surgical procedure   reviewed post-operative instructions with patient/caregiver addressed questions about post - surgical incision care  reviewed medications with patient and addressed questions reviewed scheduled provider appointments with patient: YES confirmed availability of transportation to  all appointments :Yes  Patient Self Care Activities:  Attend all scheduled provider appointments Call pharmacy for medication refills 3-7 days in advance of running out of medications Call provider office for new concerns or questions  Notify RN Care Manager of TOC call rescheduling needs Participate in Transition of Care Program/Attend TOC scheduled calls Perform all self care activities independently  Take medications as prescribed   check blood pressure 3 times per week choose a place to take my blood pressure (home, clinic or office, retail store) write blood pressure results in a log or diary learn about high blood pressure take blood pressure log to all doctor appointments keep all doctor appointments take medications for blood pressure exactly as prescribed report new symptoms to your doctor  Plan:  An initial telephone outreach has been scheduled for: 03/05/24 Thursday at 1000 am with Bing Edison RN CM The patient has been provided with contact information for the care management team and has been advised to call with any health related questions or concerns.    Bing Edison MSN, RN RN Case Sales executive Health  [facility]Population [facility] Office Hours M-F [phone] Direct Dial: [phone] Main Phone [phone]  Fax: [phone] [email or web address]           8/28: TOC RN CM completed post discharge outreach all with patient.  Patient to call to make PCP HFU appointment and declined Care Guide assist.  Verbally agreed to follow up call, with next call 03/05/24 at 1000 am with [facility] RN CM.  Discussed contacting insurance company for blood pressure monitoring and cost at [facility]. Will reassess need next call.    Bing Edison MSN, RN RN Case Sales executive Health  [facility]Population Health Office Hours M-F [phone] Direct Dial: [phone] Main Phone [phone]  Fax: [phone] [email or web address]

## 2024-02-27 NOTE — Patient Instructions (Signed)
 Visit Information  Thank you for taking time to visit with me today. Please don't hesitate to contact me if I can be of assistance to you before our next scheduled telephone appointment.  Our next appointment is by telephone on 03/05/24 at 1000  Following is a copy of your care plan:   Goals Addressed             This Visit's Progress    VBCI Transitions of Care (TOC) Care Plan       Problems:  Recent Hospitalization for treatment of Right trigeminal schwannoma Knowledge Deficit Related to post craniectomy for tumor resection and No Hospital Follow Up Provider appointment : Patient to call PCP provider today to request hospital follow up or HFU post discharge visit after the rationale was explained. Declined Care Guide Assistance.   Goal:  Over the next 30 days, the patient will not experience hospital readmission  Interventions:  Diabetes Interventions: Assessed patient's understanding of A1c goal: <6.5%: Patient latest value 6.5 on 02/19/24.  Reviewed medications with patient and discussed importance of medication adherence Discussed plans with patient for ongoing care management follow up and provided patient with direct contact information for care management team Reviewed scheduled/upcoming provider appointments including: Making a PCP hospital follow up appointment Review of patient status, including review of consultants reports, relevant laboratory and other test results, and medications completed Assessed social determinant of health barriers Discussed daily monitoring of blood pressure three times a week and recording in phone or notebook to take to providers for trends versus spot checks in office visit.  Lab Results  Component Value Date   HGBA1C 6.5 (H) 02/19/2024   Hypertension Interventions: Last practice recorded BP readings:  BP Readings from Last 3 Encounters:  02/26/24 114/68  02/19/24 127/69  02/11/24 111/68   Most recent eGFR/CrCl:  Lab Results  Component  Value Date   EGFR 99 10/29/2022    No components found for: CRCL  Evaluation of current treatment plan related to hypertension self management and patient's adherence to plan as established by provider Reviewed medications with patient and discussed importance of compliance Discussed plans with patient for ongoing care management follow up and provided patient with direct contact information for care management team Advised patient, providing education and rationale, to monitor blood pressure daily and record, calling PCP for findings outside established parameters Reviewed scheduled/upcoming provider appointments including:  Assessed social determinant of health barriers  Surgery ( s/p craniectomy for brain tunor): Evaluation of current treatment plan related to craniectomy surgery assessed patient/caregiver understanding of surgical procedure   reviewed post-operative instructions with patient/caregiver addressed questions about post - surgical incision care  reviewed medications with patient and addressed questions reviewed scheduled provider appointments with patient: YES confirmed availability of transportation to all appointments :Yes  Patient Self Care Activities:  Attend all scheduled provider appointments Call pharmacy for medication refills 3-7 days in advance of running out of medications Call provider office for new concerns or questions  Notify RN Care Manager of TOC call rescheduling needs Participate in Transition of Care Program/Attend TOC scheduled calls Perform all self care activities independently  Take medications as prescribed   check blood pressure 3 times per week choose a place to take my blood pressure (home, clinic or office, retail store) write blood pressure results in a log or diary learn about high blood pressure take blood pressure log to all doctor appointments keep all doctor appointments take medications for blood pressure exactly as  prescribed report new  symptoms to your doctor  Plan:  An initial telephone outreach has been scheduled for: 03/05/24 Thursday at 1000 am with Bing Edison RN CM The patient has been provided with contact information for the care management team and has been advised to call with any health related questions or concerns.    Bing Edison MSN, RN RN Case Sales executive Health  [facility]Population [facility] Office Hours M-F [phone] Direct Dial: [phone] Main Phone [phone]  Fax: [phone] [email or web address]           Patient verbalizes understanding of instructions and care plan provided today and agrees to view in MyChart. Active MyChart status and patient understanding of how to access instructions and care plan via MyChart confirmed with patient.     Telephone follow up appointment with care management team member scheduled for: The patient will call PCP* as advised to make a post hospital follow up appointment.   Please call the care guide team at [phone] if you need to cancel or reschedule your appointment.   Please call the Suicide and Crisis Lifeline: 988 call 1-800-273-TALK (toll free, 24 hour hotline) call 911 if you are experiencing a Mental Health or Behavioral Health Crisis or need someone to talk to.   Bing Edison MSN, RN RN Case Sales executive Health  [facility]Population [facility] Office Hours M-F [phone] Direct Dial: [phone] Main Phone [phone]  Fax: [phone] [email or web address]

## 2024-02-28 ENCOUNTER — Ambulatory Visit: Admitting: Radiation Oncology

## 2024-02-28 ENCOUNTER — Ambulatory Visit: Admitting: Neurosurgery

## 2024-02-28 ENCOUNTER — Telehealth: Payer: Self-pay | Admitting: Radiation Therapy

## 2024-02-28 DIAGNOSIS — D429 Neoplasm of uncertain behavior of meninges, unspecified: Secondary | ICD-10-CM

## 2024-02-28 NOTE — Telephone Encounter (Signed)
 I spoke with pt about the follow-up and SIM scheduled on 9/10. He was thankful for the call and planning to attend.   Devere Perch R.T.(R)(T) Radiation Special Procedures Lead

## 2024-02-28 NOTE — Progress Notes (Signed)
 Virtual Visit via Telephone Note  I connected with [name] on 02/28/24 at 12:40 PM EDT by telephone and verified that I am speaking with the correct person using two identifiers.  Location: Patient: Home Provider: Clinic   I discussed the limitations, risks, security and privacy concerns of performing an evaluation and management service by telephone and the availability of in person appointments. I also discussed with the patient that there may be a patient responsible charge related to this service. The patient expressed understanding and agreed to proceed.     I discussed the assessment and treatment plan with the patient. The patient was provided an opportunity to ask questions and all were answered. The patient agreed with the plan and demonstrated an understanding of the instructions.   The patient was advised to call back or seek an in-person evaluation if the symptoms worsen or if the condition fails to improve as anticipated.  I provided 10 minutes of non-face-to-face time during this encounter.   Timothy Popoff, MD

## 2024-03-03 ENCOUNTER — Encounter: Payer: Self-pay | Admitting: Family Medicine

## 2024-03-03 ENCOUNTER — Encounter: Admitting: Neurosurgery

## 2024-03-03 ENCOUNTER — Ambulatory Visit: Admitting: Family Medicine

## 2024-03-03 VITALS — BP 124/76 | HR 64 | Wt 208.4 lb

## 2024-03-03 DIAGNOSIS — Z1211 Encounter for screening for malignant neoplasm of colon: Secondary | ICD-10-CM | POA: Diagnosis not present

## 2024-03-03 DIAGNOSIS — Z09 Encounter for follow-up examination after completed treatment for conditions other than malignant neoplasm: Secondary | ICD-10-CM

## 2024-03-03 NOTE — Progress Notes (Signed)
 Name: [name]   Date of Visit: 03/03/24   Date of last visit with me: Visit date not found   CHIEF COMPLAINT:  Chief Complaint  Patient presents with   Hospitalization Follow-up    Post surgery follow up.        HPI:  Discussed the use of AI scribe software for clinical note transcription with the patient, who gave verbal consent to proceed.  History of Present Illness   [name] is a 68 year old male who presents for post-surgical follow-up and management of dizziness and pain.  He underwent surgery recently and is experiencing dizziness, particularly when walking. He has not been measuring his blood pressure at home and occasionally feels dizzy when transitioning from sitting to standing. He was prescribed meclizine  for dizziness but has not taken it today.  He reports ongoing pain, which was significant post-surgery but is manageable today without medication. He was prescribed Fioricet  for headaches and baclofen  for spasms. He sometimes takes acetaminophen  with Fioricet  for pain management but has not taken any medication for pain today.  He experiences trouble sleeping at night, which was not an issue before the surgery. He has trazodone  available and uses it as needed.  He has a history of elevated blood sugars, averaging 164, which is higher than his pre-surgery average of 150s. He is currently taking 12 units of Lantus insulin , increased from 6 units prior to surgery. No adjustments were made to his insulin  during his hospital stay.  He has been using Celebrex  twice daily for a torn rotator cuff for several months. He has not experienced pain recently. He has not had any injections for the rotator cuff.  He was admitted to the hospital on August 25th and discharged on August 27th.  Since discharge patient notes that he is feeling a little bit weak and fatigued but still is doing okay.  Patient has not been checking his blood pressure at home.  Patient  does have appointments for follow-up booked already with his neurosurgeon as well as with radiology for possible radioablation of his tumor.  Patient has no other concerns at this time.        OBJECTIVE:       01/28/2024   11:30 AM  Depression screen PHQ 2/9  Decreased Interest 1  Down, Depressed, Hopeless 2  PHQ - 2 Score 3     BP Readings from Last 3 Encounters:  03/03/24 124/76  02/26/24 114/68  02/19/24 127/69    BP 124/76   Pulse 64   Wt 208 lb 6.4 oz (94.5 kg)   SpO2 96%   BMI 29.07 kg/m    Physical Exam          Physical Exam Constitutional:      General: He is not in acute distress.    Appearance: Normal appearance. He is normal weight. He is not ill-appearing.  Cardiovascular:     Rate and Rhythm: Normal rate and regular rhythm.  Pulmonary:     Effort: Pulmonary effort is normal. No respiratory distress.  Neurological:     Mental Status: He is alert and oriented to person, place, and time.     ASSESSMENT/PLAN:   Assessment & Plan Hospital discharge follow-up  Screening for colon cancer    Assessment and Plan    Postoperative state after neurosurgery for tumor Persistent nerve compression symptoms and muscle spasms post-tumor removal. - Continue current medications for symptom management. - Follow up with radiologist on September  10th for potential radiation therapy.  Chronic pain and muscle spasms after neurosurgery Chronic pain and muscle spasms post-neurosurgery. Baclofen  dosage maxed out. Fioricet  contains caffeine , may interfere with sleep if taken at night. - Use acetaminophen  1000 mg at night instead of Fioricet  to avoid caffeine -related sleep disturbances. - Continue Fioricet  during the day for headache management if needed.  Type 2 diabetes mellitus Elevated blood glucose levels likely due to stress from recent surgery. Current average blood glucose is 164 mg/dL. - Maintain current insulin  regimen for one week. - Increase Lantus to  15 units nightly after one week.  Orthostatic hypotension with dizziness Dizziness when transitioning from sitting to standing. - Use time method when transitioning from lying to standing to prevent dizziness and syncope.  Rotator cuff tear, right shoulder Chronic right shoulder pain due to rotator cuff tear. Long-term Celebrex  use not ideal due to potential gastrointestinal side effects. Discussed potential for ultrasound-guided steroid injection. - Consider ultrasound-guided steroid injection for pain management in 1-2 months if pain persists. - Discontinue Celebrex  if not experiencing pain, but keep available for as-needed use.         Dayron Odland A. Vita MD [facility]

## 2024-03-04 ENCOUNTER — Telehealth: Payer: Self-pay | Admitting: Neurosurgery

## 2024-03-04 NOTE — Telephone Encounter (Signed)
 We received a fax from [employer] company that is a form for the provider to complete. I took back to the nurses station

## 2024-03-05 ENCOUNTER — Other Ambulatory Visit: Payer: Self-pay

## 2024-03-05 NOTE — Patient Instructions (Signed)
 Visit Information  Thank you for taking time to visit with me today. Please don't hesitate to contact me if I can be of assistance to you before our next scheduled telephone appointment.  Our next appointment is by telephone on 03/12/24 at 1000  Following is a copy of your care plan:   Goals Addressed             This Visit's Progress    [facility] Transitions of Care (TOC) Care Plan   On track    Problems:  Recent Hospitalization for treatment of Right trigeminal schwannoma Knowledge Deficit Related to post craniectomy for tumor resection and No Hospital Follow Up Provider appointment : Patient to call PCP provider today to request hospital follow up or HFU post discharge visit after the rationale was explained. Declined Care Guide Assistance.  PCP HFU completed on 03/03/24.  Neurosurgical follow up on 03/11/24 to remove staples.  03/20/24 Scheduled appt. With Radiology/Radiation  Goal:  Over the next 30 days, the patient will not experience hospital readmission  Interventions:  Diabetes Interventions: Assessed patient's understanding of A1c goal: <6.5%: Patient latest value 6.5 on 02/19/24.  Blood sugars averaging n 160's per patient stated.  PCP increased Lantus insulin  from 12 units to 15 units to start a week after 03/03/24 visit and timing changed to nightly.  Reviewed medications with patient and discussed importance of medication adherence. Discussed plans with patient for ongoing care management follow up and provided patient with direct contact information for care management team. Reviewed scheduled/upcoming provider appointments including: Making a PCP hospital follow up appointment--Completed 03/03/24.  Review of patient status, including review of consultants reports, relevant laboratory and other test results, and medications completed. Assessed social determinant of health barriers and no changes noted/reported by patient.  Discussed daily monitoring of blood pressure three times a  week and recording in phone or notebook to take to providers for trends versus spot checks in office visit.  PCP office vitals recorded in office visit notes: BP 124/76   Pulse 64   Wt 208 lb 6.4 oz (94.5 kg)   SpO2 96%   BMI 29.07 kg/m . Patient has not been taking blood pressures at home stating his blood pressure is taken at every office visit and has been good.  Rationale for taking blood pressures at home while on blood pressure medication reviewed with patient.   Lab Results  Component Value Date   HGBA1C 6.5 (H) 02/19/2024   Hypertension Interventions: Last practice recorded BP readings:  03/03/24 in PCP office: 124/76.  BP Readings from Last 3 Encounters:  02/26/24 114/68  02/19/24 127/69  02/11/24 111/68   Most recent eGFR/CrCl:  Lab Results  Component Value Date   EGFR 99 10/29/2022    No components found for: CRCL  Evaluation of current treatment plan related to hypertension self management and patient's adherence to plan as established by provider. Reviewed medications with patient and discussed importance of compliance. Discussed plans with patient for ongoing care management follow up and provided patient with direct contact information for care management team. Advised patient, providing education and rationale, to monitor blood pressure daily and record, calling PCP for findings outside established parameters. Reviewed scheduled/upcoming provider appointments including: neurosurgical follow up on 03/20/24 and radiation follow up on 03/11/24.  Assessed social determinant of health barriers for any changes.   Surgery ( s/p craniectomy for brain tunor): Evaluation of current treatment plan related to craniectomy surgery. assessed patient/caregiver understanding of surgical procedure .  reviewed post-operative  instructions with patient/caregiver, including what to call provider for.  addressed questions about post - surgical incision care.  Patient reports itching of  incision with staples present.  No other issues with incision reported.  Patient to contact neurosurgery office to see if anything can be done to address this.  Staples due to be removed 03/10/24.  reviewed medications with patient and addressed questions.  Noted in change in Lantus dose to start a week after 03/03/24 PCP HFU.  Not reflected yet on Medications list, but a note was added.  Reviewed scheduled provider appointments with patient: YES. Confirmed availability of transportation to all appointments :Yes.  Patient Self Care Activities:  Attend all scheduled provider appointments Call pharmacy for medication refills 3-7 days in advance of running out of medications Call provider office for new concerns or questions  Notify RN Care Manager of TOC call rescheduling needs Participate in Transition of Care Program/Attend TOC scheduled calls Perform all self care activities independently  Take medications as prescribed   check blood pressure 3 times per week choose a place to take my blood pressure (home, clinic or office, retail store) write blood pressure results in a log or diary learn about high blood pressure take blood pressure log to all doctor appointments keep all doctor appointments take medications for blood pressure exactly as prescribed report new symptoms to your doctor  Plan:  An initial telephone outreach has been scheduled for: 03/12/24 Thursday at 1000 am with Bing Edison RN CM The patient has been provided with contact information for the care management team and has been advised to call with any health related questions or concerns.    Bing Edison MSN, RN RN Case Sales executive Health  [facility] Office Hours M-F [phone] Direct Dial: [phone] Main Phone [phone]  Fax: [phone] [email or web address]         The patient has been provided with contact information for the care management team and has been advised to call with any  health-related questions or concerns. The patient verbalized understanding with current POC. The patient is directed to their insurance card regarding availability of benefits coverage    Patient verbalizes understanding of instructions and care plan provided today and agrees to view in MyChart. Active MyChart status and patient understanding of how to access instructions and care plan via MyChart confirmed with patient.     Telephone follow up appointment with care management team member scheduled for:03/12/24 Thursday at 1000 am with Bing Edison RN CM  Please call the care guide team at [phone] if you need to cancel or reschedule your appointment.   Please call the Suicide and Crisis Lifeline: 988 go to [facility]gent [facility] [address], [place] [phone]) call 911 if you are experiencing a Mental Health or Behavioral Health Crisis or need someone to talk to.   Bing Edison MSN, RN RN Case Sales executive Health  [facility] Office Hours M-F [phone] Direct Dial: [phone] Main Phone [phone]  Fax: [phone] [email or web address]

## 2024-03-05 NOTE — Progress Notes (Signed)
 Location/Histology of Brain Tumor:  Right Trigeminal Tumor  Patient presented with symptoms of:  Janjua, MD Right sided facial numbness that was in the lower part of his face. After this he started noticing neuropathic pains in his face as well. After this, he also started noticing neuropathic pains in his face as well as on the inside of his mouth.   Past or anticipated interventions, if any, per neurosurgery:  02/24/2024 [place], MD Right Posterior Fossa Craniotomy Tumor Resection  02/24/2024 MRI Brain W WO Contrast     Past or anticipated interventions, if any, per medical oncology:  None   Dose of Decadron , if applicable:  None   Recent neurologic symptoms, if any:  Seizures: None Headaches: Experiences headaches every other day Nausea: None Dizziness/ataxia: None Difficulty with hand coordination: None Focal numbness/weakness: Yes, experiences numbness on right side of face Visual deficits/changes: Sometimes vision in right eye is blurry, uses eye drops to minimize side effects Confusion/Memory deficits: None   Painful bone metastases at present, if any: Having Hip and back pain intermittently.   SAFETY ISSUES: Prior radiation? None Pacemaker/ICD? None Possible current pregnancy? N/A Is the patient on methotrexate? None

## 2024-03-05 NOTE — Transitions of Care (Post Inpatient/ED Visit) (Signed)
 Transition of Care week 2  Visit Note  03/05/2024  Name: [name] MRN: [id number]          DOB: 1956/04/11  Situation: Patient enrolled in [facility] 30-day program. Visit completed with patient by telephone.   Background:   Initial Transition Care Management Follow-up Telephone Call    Past Medical History:  Diagnosis Date   Anxiety    Arthritis    back, shoulders, left great toe   Candidiasis, mouth 03/22/2016   Carpal tunnel syndrome of right wrist 10/2014   Dental crowns present    Depression    Dyslipidemia    Ear fullness 08/30/2016   GERD (gastroesophageal reflux disease)    Headache    Tension Headaches   Hypertension    under control with med., per pt.; has been on med. x 15-20 yr.   Insulin  dependent diabetes mellitus    Nummular eczema    Obesity    Onychomycosis 08/30/2016   Recurrent infections 03/22/2016   Sleep apnea    uses CPAP nightly    Assessment: Patient Reported Symptoms: Cognitive Cognitive Status: No symptoms reported, Alert and oriented to person, place, and time, Insightful and able to interpret abstract concepts, Normal speech and language skills      Neurological Neurological Review of Symptoms: Dizziness Neurological Management Strategies: Adequate rest, Coping strategies, Routine screening, Medication therapy Neurological Comment: Improving since last week per patient stated.  HEENT HEENT Symptoms Reported: Change or loss of hearing, Eye dryness HEENT Management Strategies: Adequate rest, Coping strategies, Medication therapy, Routine screening HEENT Comment: Hearing issues, uses eye drops for dry eyes.    Cardiovascular Cardiovascular Symptoms Reported: No symptoms reported Does patient have uncontrolled Hypertension?: No (Does not take blood pressure at home despited discussion/rationale/education. Patient states it is good every time it is checked for an appointment.) Cardiovascular Management Strategies: Activity, Exercise,  Coping strategies, Medical device, Medication therapy, Routine screening Cardiovascular Self-Management Outcome: 4 (good)  Respiratory Respiratory Symptoms Reported: No symptoms reported Respiratory Self-Management Outcome: 4 (good)  Endocrine Endocrine Symptoms Reported: No symptoms reported Is patient diabetic?: Yes Endocrine Self-Management Outcome: 4 (good) Endocrine Comment: averaging 160's per patient with Freestyle Libre monitor. PCP upped dose of Lantus starting in on week to 15 units nightly per PCP HFU notes of 03/03/24.  Gastrointestinal Gastrointestinal Symptoms Reported: No symptoms reported      Genitourinary Genitourinary Symptoms Reported: No symptoms reported    Integumentary Integumentary Symptoms Reported: Incision, Itching Skin Management Strategies: Coping strategies, Routine screening, Adequate rest Skin Comment: Patient to reach out to neurosurgeon office to see if anything can help the itching. Staples to come out 03/10/24 at next appointment.  Musculoskeletal Musculoskelatal Symptoms Reviewed: Weakness Musculoskeletal Management Strategies: Adequate rest, Activity, Coping strategies, Routine screening Musculoskeletal Comment: OP PT recommended after cleared. Falls in the past year?: No Patient at Risk for Falls Due to: Medication side effect, Other (Comment) (S/p craniectomy) Fall risk Follow up: Falls prevention discussed  Psychosocial Psychosocial Symptoms Reported: No symptoms reported     Do you feel physically threatened by others?: No   There were no vitals filed for this visit. Patient is not taking blood pressure at home/ Blood sugars averaging 160's per patient stated.  Vitals on 03/03/24 at PCP HFU notes: BP 124/76   Pulse 64   Wt 208 lb 6.4 oz (94.5 kg)   SpO2 96%   BMI 29.07 kg/m    Medications Reviewed Today     Reviewed by Carolee Heron NOVAK, RN (Case  Manager) on 03/05/24 at 1033  Med List Status: <None>   Medication Order Taking? Sig  Documenting Provider Last Dose Status Informant  acetaminophen  (TYLENOL ) 500 MG tablet [id number] Yes Take 500-1,000 mg by mouth every 6 (six) hours as needed (pain.). [provider]  Active Self  Azelastine  HCl 137 MCG/SPRAY SOLN [id number] Yes Place 1 spray into both nostrils daily. Joyce Norleen BROCKS, MD  Active Self  B Complex-C (B-COMPLEX WITH VITAMIN C) tablet [id number] Yes Take 1 tablet by mouth 2 (two) times daily. [provider]  Active Self  baclofen  (LIORESAL ) 10 MG tablet [id number] Yes Take 1 tablet (10 mg total) by mouth 3 (three) times daily as needed for up to 20 days for muscle spasms. Claudene Toribio BROCKS, MD  Active   buPROPion  (WELLBUTRIN  SR) 200 MG 12 hr tablet [id number] Yes Take 1 tablet (200 mg total) by mouth 2 (two) times daily. Tasia Lung, MD  Active Self  butalbital -acetaminophen -caffeine  (FIORICET ) 50-325-40 MG tablet [id number] Yes Take 2 tablets by mouth every 4 (four) hours as needed for headache (Moderate pain). Claudene Toribio BROCKS, MD  Active   CALCIUM  CITRATE-VITAMIN D PO [id number] Yes Take 1 tablet by mouth 2 (two) times daily. [provider]  Active Self  celecoxib  (CELEBREX ) 100 MG capsule [id number] Yes TAKE 1 CAPSULE BY MOUTH TWICE  DAILY Magnant, Charles L, PA-C  Active Self  cetirizine (ZYRTEC) 10 MG tablet [id number] Yes Take 10 mg by mouth in the morning. [provider]  Active Self  Continuous Blood Gluc Sensor (FREESTYLE LIBRE 2 SENSOR) MISC [id number] Yes Now on [facility] 3 [provider]  Active Self  Continuous Glucose Receiver (FREESTYLE LIBRE 2 READER) DEVI [id number] Yes Scan as needed for continuous glucose monitoring. [provider]  Active Self  cyanocobalamin (VITAMIN B12) 1000 MCG tablet [id number] Yes Take 1,000 mcg by mouth in the morning. [provider]  Active Self  DENTA 5000 PLUS 1.1 % CREA dental cream [id number] Yes Place 1 Application onto teeth in the morning and at bedtime. BRUSH  WITH PEASIZED AMOUNT TWICE DAILY THEN SPIT OUT, NO FOOD/DRINK FOR 30 MINUTES AFTER [provider]  Active Self  desvenlafaxine  (PRISTIQ ) 50 MG 24 hr tablet [id number] Yes 2 qam Plovsky, Gerald, MD  Active Self  empagliflozin (JARDIANCE) 25 MG TABS tablet [id number] Yes Take 25 mg by mouth in the morning. [provider]  Active Self  Ferrous Sulfate  (IRON  SLOW RELEASE) 142 (45 Fe) MG TBCR [id number] Yes Take 1 tablet by mouth daily. Joyce Norleen BROCKS, MD  Active Self  fluticasone  (FLONASE ) 50 MCG/ACT nasal spray [id number] Yes USE 1 SPRAY NASALLY ONCE A  DAY FOR ALLERGIES  Patient taking differently: Place 2 sprays into both nostrils every evening.   Lalonde, John C, MD  Active Self  gabapentin  (NEURONTIN ) 300 MG capsule [id number] Yes Take 1 capsule (300 mg) by mouth in the morning, take 1 capsule (300 mg) by mouth with supper & take 2 capsules (600 mg) by mouth at bedtime. Rosslyn Dino HERO, MD  Active   glucose blood test strip [id number] Yes USE TO CHECK SUGAR 4 TIMES  DAILY. [provider]  Active Self  insulin  glargine (LANTUS SOLOSTAR) 100 UNIT/ML Solostar Pen [id number]  Inject 12 Units into the skin in the morning. [provider]  Active Self           Med Note SYDELL, Prabhleen Montemayor B   Thu Mar 05, 2024 10:15 AM) Noted in PCP notes that dose  change to take place after on week and to change to nightly.   Insulin  Pen Needle 32G X 4 MM MISC [id number] Yes Use to inject insulin  once a day or as directed [provider]  Active Self  Insulin  Syringe-Needle U-100 (INSULIN  SYRINGE 1CC/31GX5/16) 31G X 5/16 1 ML MISC [id number] Yes  [provider]  Active Self  levocetirizine (XYZAL ) 5 MG tablet [id number] Yes TAKE 1 TABLET BY MOUTH AT  BEDTIME Joyce Norleen BROCKS, MD  Active Self  lisinopril  (ZESTRIL ) 10 MG tablet [id number] Yes Take 1 tablet (10 mg total) by mouth daily. Joyce Norleen BROCKS, MD  Active Self  meclizine  (ANTIVERT ) 25 MG tablet [id number] Yes Take 1 tablet  (25 mg total) by mouth 3 (three) times daily as needed for dizziness. Jenna Maude BRAVO, NP  Active   metFORMIN (GLUCOPHAGE) 1000 MG tablet [id number] Yes Take 1,000 mg by mouth 2 (two) times daily with a meal. [provider]  Active Self  montelukast  (SINGULAIR ) 10 MG tablet [id number] Yes Take 1 tablet (10 mg total) by mouth at bedtime. Joyce Norleen BROCKS, MD  Active Self  Multiple Vitamin (MULTIVITAMIN WITH MINERALS) TABS tablet [id number] Yes Take 1 tablet by mouth in the morning and at bedtime. [provider]  Active Self  ondansetron  (ZOFRAN -ODT) 4 MG disintegrating tablet [id number] Yes Take 1 tablet (4 mg total) by mouth every 8 (eight) hours as needed for nausea or vomiting. Claudene Toribio BROCKS, MD  Active   [facility] LANCETS 33G [place] [id number]   [provider]  Active Self           Med Note LANITA CORDELLA SQUIBB   Dju Apr 05, 2018  4:31 PM)    AISHA SINKS test strip [id number]   [provider]  Active Self           Med Note LANITA CORDELLA SQUIBB   Dju Apr 05, 2018  4:31 PM)    pilocarpine (SALAGEN) 5 MG tablet [id number]  Take 5 mg by mouth 3 (three) times daily. [provider]  Active Self  Polyethyl Glycol-Propyl Glycol (SYSTANE) 0.4-0.3 % SOLN [id number] Yes Place 1 drop into both eyes 3 (three) times daily as needed (dry/irritated eyes.). [provider]  Active Self  polyethylene glycol (MIRALAX  / GLYCOLAX ) 17 g packet [id number] Yes Take 17 g by mouth daily.  Patient taking differently: Take 17 g by mouth daily as needed.   Jenna Maude BRAVO, NP  Active            Med Note MAPLE, RILEY C   Tue Mar 03, 2024  3:03 PM) As needed  rosuvastatin  (CRESTOR ) 20 MG tablet [id number] Yes TAKE 1 TABLET BY MOUTH DAILY  Patient taking differently: Take 20 mg by mouth every evening.   Joyce Norleen BROCKS, MD  Active Self  tadalafil  (CIALIS ) 20 MG tablet [id number] Yes Take 1 tablet (20 mg total) by mouth daily as needed for erectile dysfunction. Joyce Norleen BROCKS, MD  Active Self  traZODone  (DESYREL ) 100 MG tablet [id number] Yes Take 100 mg by mouth at bedtime as needed for sleep. [provider]  Active Self           Med Note CLAUD, MICHEAL DASEN   Fri Feb 14, 2024 11:35 AM)              Goals Addressed             This Visit's Progress    [facility] Transitions of Care (  TOC) Care Plan   On track    Problems:  Recent Hospitalization for treatment of Right trigeminal schwannoma Knowledge Deficit Related to post craniectomy for tumor resection and No Hospital Follow Up Provider appointment : Patient to call PCP provider today to request hospital follow up or HFU post discharge visit after the rationale was explained. Declined Care Guide Assistance.  PCP HFU completed on 03/03/24.  Neurosurgical follow up on 03/11/24 to remove staples.  03/20/24 Scheduled appt. With Radiology/Radiation  Goal:  Over the next 30 days, the patient will not experience hospital readmission  Interventions:  Diabetes Interventions: Assessed patient's understanding of A1c goal: <6.5%: Patient latest value 6.5 on 02/19/24.  Blood sugars averaging n 160's per patient stated.  PCP increased Lantus insulin  from 12 units to 15 units to start a week after 03/03/24 visit and timing changed to nightly.  Reviewed medications with patient and discussed importance of medication adherence. Discussed plans with patient for ongoing care management follow up and provided patient with direct contact information for care management team. Reviewed scheduled/upcoming provider appointments including: Making a PCP hospital follow up appointment--Completed 03/03/24.  Review of patient status, including review of consultants reports, relevant laboratory and other test results, and medications completed. Assessed social determinant of health barriers and no changes noted/reported by patient.  Discussed daily monitoring of blood pressure three times a week and recording in phone or notebook to  take to providers for trends versus spot checks in office visit.  PCP office vitals recorded in office visit notes: BP 124/76   Pulse 64   Wt 208 lb 6.4 oz (94.5 kg)   SpO2 96%   BMI 29.07 kg/m . Patient has not been taking blood pressures at home stating his blood pressure is taken at every office visit and has been good.  Rationale for taking blood pressures at home while on blood pressure medication reviewed with patient.   Lab Results  Component Value Date   HGBA1C 6.5 (H) 02/19/2024   Hypertension Interventions: Last practice recorded BP readings:  03/03/24 in PCP office: 124/76.  BP Readings from Last 3 Encounters:  02/26/24 114/68  02/19/24 127/69  02/11/24 111/68   Most recent eGFR/CrCl:  Lab Results  Component Value Date   EGFR 99 10/29/2022    No components found for: CRCL  Evaluation of current treatment plan related to hypertension self management and patient's adherence to plan as established by provider. Reviewed medications with patient and discussed importance of compliance. Discussed plans with patient for ongoing care management follow up and provided patient with direct contact information for care management team. Advised patient, providing education and rationale, to monitor blood pressure daily and record, calling PCP for findings outside established parameters. Reviewed scheduled/upcoming provider appointments including: neurosurgical follow up on 03/20/24 and radiation follow up on 03/11/24.  Assessed social determinant of health barriers for any changes.   Surgery ( s/p craniectomy for brain tunor): Evaluation of current treatment plan related to craniectomy surgery. assessed patient/caregiver understanding of surgical procedure .  reviewed post-operative instructions with patient/caregiver, including what to call provider for.  addressed questions about post - surgical incision care.  Patient reports itching of incision with staples present.  No other  issues with incision reported.  Patient to contact neurosurgery office to see if anything can be done to address this.  Staples due to be removed 03/10/24.  reviewed medications with patient and addressed questions.  Noted in change in Lantus dose to start a week after 03/03/24  PCP HFU.  Not reflected yet on Medications list, but a note was added.  Reviewed scheduled provider appointments with patient: YES. Confirmed availability of transportation to all appointments :Yes.  Patient Self Care Activities:  Attend all scheduled provider appointments Call pharmacy for medication refills 3-7 days in advance of running out of medications Call provider office for new concerns or questions  Notify RN Care Manager of TOC call rescheduling needs Participate in Transition of Care Program/Attend TOC scheduled calls Perform all self care activities independently  Take medications as prescribed   check blood pressure 3 times per week choose a place to take my blood pressure (home, clinic or office, retail store) write blood pressure results in a log or diary learn about high blood pressure take blood pressure log to all doctor appointments keep all doctor appointments take medications for blood pressure exactly as prescribed report new symptoms to your doctor  Plan:  An initial telephone outreach has been scheduled for: 03/12/24 Thursday at 1000 am with Bing Edison RN CM The patient has been provided with contact information for the care management team and has been advised to call with any health related questions or concerns.    Bing Edison MSN, RN RN Case Sales executive Health  [facility] Office Hours M-F [phone] Direct Dial: [phone] Main Phone [phone]  Fax: [phone] [email or web address]           The patient has been provided with contact information for the care management team and has been advised to call with any health-related questions or concerns. The  patient verbalized understanding with current POC. The patient is directed to their insurance card regarding availability of benefits coverage.  Recommendation:   Continue Current Plan of Care  Follow Up Plan:   Telephone follow-up in 1 week. 03/12/24 Thursday at 1000 am with Bing Edison RN CM   Bing Edison MSN, RN RN Case Manager [facility]  [facility] Office Hours M-F [phone] Direct Dial: [phone] Main Phone [phone]  Fax: [phone] [email or web address]

## 2024-03-06 NOTE — Telephone Encounter (Signed)
 Form filled out and will fax back today.

## 2024-03-06 NOTE — Telephone Encounter (Signed)
 Faxed back to Short Term Disability.

## 2024-03-09 ENCOUNTER — Encounter: Payer: Self-pay | Admitting: Radiation Oncology

## 2024-03-09 ENCOUNTER — Inpatient Hospital Stay: Attending: Radiation Oncology

## 2024-03-09 DIAGNOSIS — D420 Neoplasm of uncertain behavior of cerebral meninges: Secondary | ICD-10-CM | POA: Insufficient documentation

## 2024-03-10 ENCOUNTER — Ambulatory Visit (INDEPENDENT_AMBULATORY_CARE_PROVIDER_SITE_OTHER): Admitting: Neurosurgery

## 2024-03-10 ENCOUNTER — Ambulatory Visit: Admitting: Radiation Oncology

## 2024-03-10 ENCOUNTER — Other Ambulatory Visit: Payer: Self-pay | Admitting: Neurosurgery

## 2024-03-10 ENCOUNTER — Ambulatory Visit: Payer: Self-pay

## 2024-03-10 ENCOUNTER — Encounter: Payer: Self-pay | Admitting: Neurosurgery

## 2024-03-10 ENCOUNTER — Ambulatory Visit: Admitting: Internal Medicine

## 2024-03-10 ENCOUNTER — Encounter: Payer: Self-pay | Admitting: Internal Medicine

## 2024-03-10 VITALS — BP 106/74 | HR 72 | Ht 71.0 in | Wt 208.0 lb

## 2024-03-10 VITALS — BP 100/63 | HR 63 | Ht 71.0 in | Wt 208.0 lb

## 2024-03-10 DIAGNOSIS — E119 Type 2 diabetes mellitus without complications: Secondary | ICD-10-CM | POA: Diagnosis not present

## 2024-03-10 DIAGNOSIS — G509 Disorder of trigeminal nerve, unspecified: Secondary | ICD-10-CM

## 2024-03-10 DIAGNOSIS — R600 Localized edema: Secondary | ICD-10-CM

## 2024-03-10 DIAGNOSIS — Z8603 Personal history of neoplasm of uncertain behavior: Secondary | ICD-10-CM

## 2024-03-10 DIAGNOSIS — R5383 Other fatigue: Secondary | ICD-10-CM

## 2024-03-10 DIAGNOSIS — R5381 Other malaise: Secondary | ICD-10-CM

## 2024-03-10 DIAGNOSIS — D429 Neoplasm of uncertain behavior of meninges, unspecified: Secondary | ICD-10-CM

## 2024-03-10 DIAGNOSIS — D329 Benign neoplasm of meninges, unspecified: Secondary | ICD-10-CM

## 2024-03-10 DIAGNOSIS — Z9889 Other specified postprocedural states: Secondary | ICD-10-CM

## 2024-03-10 DIAGNOSIS — Z7985 Long-term (current) use of injectable non-insulin antidiabetic drugs: Secondary | ICD-10-CM

## 2024-03-10 DIAGNOSIS — I951 Orthostatic hypotension: Secondary | ICD-10-CM

## 2024-03-10 NOTE — Progress Notes (Addendum)
 Patient Care Team: Joyce Norleen BROCKS, MD as PCP - General (Family Medicine) Sebastian Norman RAMAN, MD as Referring Physician (Internal Medicine) Rosslyn Dino HERO, MD as Consulting Physician (Neurosurgery) Carolee Heron NOVAK, RN as Case Manager  Visit Date: 03/10/24  Subjective:    Patient ID: [name] , Male   DOB: Dec 29, 1955, 68 y.o.    MRN: [id number]   68 y.o. Male presents today for acute visit for low BP and dizziness . Patient has a Past Medical history of Hypertension associated with Type 2 Diabetes, PVCs, Allergic Rhinitis, OSA, GERD.   His Primary Care Physician is Dr. Norleen Joyce.   On 02/24/2024, he had a right posterior fossa craniotomy with tumor resection. Tumor was a Meningioma. At his post op visit with Neurosurgeon, Dr. Janjua  on 03/03/2024 he stated that he has dizziness upon standing. He was prescribed Meclizine  for the dizziness. His blood pressure today is low  at 100/63 on arrival.  He says he has intermittent dizziness.  CBC and BMET ordered. He will be contacted with results tomorrow.  We proceeded to perform blood pressure readings lying, sitting, and standing today.  Blood pressure supine is 120/62 with pulse of 65; blood pressure sitting is 128/80 with pulse of 65 ; blood pressure standing is 100/60 with pulse of 69.  His pulse is regular.   History of Diabetes Mellitus  type II treated with Metformin 1000 mg by mouth 2 times daily, Jardiance 25 mg daily, and Lantus Solostar 100 units/mL 12 units injected into the skin daily.   History of Hyperlipidemia treated with Rosuvastatin  20 mg daily    History of hypertension treated by Lisinopril  10 mg daily.       Past Medical History:  Diagnosis Date   Anxiety    Arthritis    back, shoulders, left great toe   Candidiasis, mouth 03/22/2016   Carpal tunnel syndrome of right wrist 10/2014   Dental crowns present    Depression    Dyslipidemia    Ear fullness 08/30/2016   GERD (gastroesophageal reflux  disease)    Headache    Tension Headaches   Hypertension    under control with med., per pt.; has been on med. x 15-20 yr.   Insulin  dependent diabetes mellitus    Nummular eczema    Obesity    Onychomycosis 08/30/2016   Recurrent infections 03/22/2016   Sleep apnea    uses CPAP nightly     Family History  Problem Relation Age of Onset   Heart disease Mother    Cancer Mother 48       Multiple myeloma   Diabetes Father    Diabetes Brother    Heart disease Brother 49       cardiomyopathy   Heart disease Brother 77       MI   Arthritis Paternal Grandmother    Diabetes Paternal Grandmother    Heart disease Paternal Grandmother    Stroke Paternal Grandmother     Social Hx: resides alone.   His Endocrinologist is with [facility], Dr. Sebastian. Patient has been prescribed Jardiance, Metformin and Lantus insulin  by Endocrinologist. He has a prior history of bariatric surgery. Diabetes dx around 28. [facility] for eye exams. Has continuous glucose monitor.Hx of OSA. Has oral appliance.Hx of allergic rhinitis and GERD. Hx of restless leg syndrome.Sees Dr. Tasia for depression Treated with Pristiq  for depression.SABRAHis Hgb AIC in June was 6.4%.Negative Cologard may 2025.   Review of Systems  Cardiovascular:  Positive for leg swelling.  Neurological:  Positive for dizziness.        Objective:   Vitals:  Standing BP 100/63   Pulse 63   Ht 5' 11 (1.803 m)   Wt 208 lb (94.3 kg)   SpO2 98%   BMI 29.01 kg/m    Physical Exam Cardiovascular:     Rate and Rhythm: Normal rate and regular rhythm.    Skin is warm and dry. He is in no acute distress. Chest is clear. Cor: RRR without murmur or ectopy. No LE edema. He is alert, oriented and able to give clear concise history.   Results:      Labs:       Component Value Date/Time   NA 138 02/25/2024 0550   NA 143 10/29/2022 1302   K 3.9 02/25/2024 0550   CL 107 02/25/2024 0550   CO2 19 (L)  02/25/2024 0550   GLUCOSE 133 (H) 02/25/2024 0550   BUN 18 02/25/2024 0550   BUN 17 10/29/2022 1302   CREATININE 0.94 02/25/2024 0550   CREATININE 0.80 01/28/2024 1200   CREATININE 0.83 10/24/2023 1156   CALCIUM  8.4 (L) 02/25/2024 0550   PROT 6.2 10/24/2023 1156   PROT 6.3 07/03/2022 1630   ALBUMIN 4.4 07/03/2022 1630   AST 17 10/24/2023 1156   ALT 18 10/24/2023 1156   ALKPHOS 68 07/03/2022 1630   BILITOT 0.4 10/24/2023 1156   BILITOT 0.4 07/03/2022 1630   GFRNONAA >60 02/25/2024 0550   GFRNONAA >60 01/28/2024 1200   GFRAA >60 04/05/2018 1542     Lab Results  Component Value Date   WBC 14.3 (H) 02/25/2024   HGB 14.2 02/25/2024   HCT 40.6 02/25/2024   MCV 94.4 02/25/2024   PLT 218 02/25/2024    Lab Results  Component Value Date   CHOL 118 03/20/2023   HDL 47 03/20/2023   LDLCALC 47 03/20/2023   TRIG 138 03/20/2023   CHOLHDL 2.5 03/20/2023    Lab Results  Component Value Date   HGBA1C 6.5 (H) 02/19/2024     Lab Results  Component Value Date   TSH 0.44 10/24/2023         Assessment & Plan:  S/p recent meningioma resection- doing well post op but BP has been low. Has not been as physically active recently and may not need Lisinopril  until he is more active once again. Suggest he hold this medication for a few days.  Addendum: labs are WNL. Patient will be contacted   Malaise and fatigue: 02/24/2024 He had a right posterior fossa craniotomy menigioma resection. At his hospital follow up on 03/03/2024 he admitted that he has dizziness upon standing. He was given meclizine  for the dizziness. His blood pressure today was hypotensive at 100/63. He has orthostatic hypotension.  He says he has intermittent dizziness.   CBC and BMET ordered.    Diabetes Mellitus type II: treated with metformin 1000 mg by mouth 2 times daily, Jardiance 25 mg daily, and Lantus Solostar 100 units/mL 12 units injected into the skin daily.   Hyperlipidemia: treated by rosuvastatin  20 mg  daily    Hypertension: treated by lisinopril  10 mg daily.   Recommended to discontinue lisinopril  for a few days to see if blood pressure will stabilize. If blood pressure becomes elevated again with increased activity, he can restart lisinopril .     I,Makayla C Reid,acting as a scribe for Ronal JINNY Hailstone, MD.,have documented all relevant documentation on the behalf of Ronal  JINNY Hailstone, MD,as directed by  Ronal JINNY Hailstone, MD while in the presence of Ronal JINNY Hailstone, MD.   I, Ronal JINNY Hailstone, MD, have reviewed all documentation for this visit. The documentation on 03/10/2024 for the exam, diagnosis, procedures, and orders are all accurate and complete.

## 2024-03-10 NOTE — Progress Notes (Signed)
 68 year old gentleman with a right sided Meckel's cave and posterior fossa mass which upon planning for stereotactic radiosurgery had shown progression.  Patient underwent surgery and we got tissue diagnosis as well as decompression of the brainstem.  Postoperatively he did well but states that he has dizziness.  He says that the dizziness is most prominent when he gets up from a seated position to standing.  Sometimes it is also there when he is seated.  At the time of discharge he had a nystagmus but on today's exam he does not have that anymore.  The blood pressure measurement after he walked in here and was seated was 109/74.  I shared with him that even though he is not on any beta-blocker.  It very somewhat sounds like orthostatic hypotension and I recommended that when he sees his primary care doctor later this week to talk to him about this.  I remove his staples today and his incision looks good.  He has not had any fevers nor has he had any drainage from his incision.  From my standpoint he can take a shower now.  If he does have any drainage or fever, he will let me know.  He is going to be seeing Dr. Izell, radiation oncologist, for the planning for his grade 2 meningioma.  Plan on seeing him back in October for a regular postop visit.  If anything changes prior to that, he will let me know.

## 2024-03-10 NOTE — Telephone Encounter (Signed)
 FYI Only or Action Required?: Action required by provider: update on patient condition.  Patient was last seen in primary care on 03/03/2024 by Vita Morrow, MD.  Called Nurse Triage reporting Hypotension.  Symptoms began several days ago.  Interventions attempted: Prescription medications: LISINOPRIL .  Symptoms are: gradually worsening.  Triage Disposition: See Physician Within 24 Hours  Patient/caregiver understands and will follow disposition?: Yes Patient seen by NSG today for follow-up visit, was found to be hypotensive.  Pt endorsed dizziness at that time. Pt reports history of dizziness with some imporvement after brain surgery, but still gets dizzy when changing head positions or walking. Pt was advised to f/u with PCP.  RN offered visit at [facility] tomorrow, pt declined due to scheduling conflict, radiation, and transportation. Pt asking to be seen on 9/11, RN hen offered visit with Dr. Perri this afternoon for sooner evaluation. Pt is agreeable. Appt scheduled for  9/9 at 4pm at [facility]  Copied from CRM #[id number]. Topic: Clinical - Red Word Triage >> Mar 10, 2024 12:43 PM Graeme ORN wrote: Red Word that prompted transfer to Nurse Triage: Dizziness, low bp per neurosurgeon   ----------------------------------------------------------------------- From previous Reason for Contact - Scheduling: Patient/patient representative is calling to schedule an appointment. Refer to attachments for appointment information. Reason for Disposition  [1] Systolic BP 90-110 AND [2] taking blood pressure medications AND [3] NOT feeling weak or lightheaded  Answer Assessment - Initial Assessment Questions 1. BLOOD PRESSURE: What is your blood pressure? Did you take at least two measurements 5 minutes apart?     106/74-  2. ONSET: When did you take your blood pressure?     Noticed today at doctor appt  3. HOW: How did you take your blood pressure? (e.g., visiting nurse, automatic home BP monitor)      Taken in office at NSG appt  4. HISTORY: Do you have a history of low blood pressure? What is your blood pressure normally?     No  5. MEDICINES: Are you taking any medicines for blood pressure? If Yes, ask: Have they been changed recently?     Taking Lisinopril   6. PULSE RATE: Do you know what your pulse rate is?      Pulse was 72  7. OTHER SYMPTOMS: Have you been sick recently? Have you had a recent injury?     Feeling dizzy since brain surgery 8/25 to have tumor removed. Reports that dizziness was worse then but has improved now still persistent intermittently  Protocols used: Blood Pressure - Low-A-AH

## 2024-03-11 ENCOUNTER — Ambulatory Visit
Admission: RE | Admit: 2024-03-11 | Discharge: 2024-03-11 | Discharge status: Home / Self Care | Source: Ambulatory Visit | Attending: Radiation Oncology

## 2024-03-11 ENCOUNTER — Ambulatory Visit
Admission: RE | Admit: 2024-03-11 | Discharge: 2024-03-11 | Disposition: A | Discharge status: Home / Self Care | Source: Ambulatory Visit | Attending: Radiation Oncology | Admitting: Radiation Oncology

## 2024-03-11 ENCOUNTER — Ambulatory Visit: Payer: Self-pay | Admitting: Internal Medicine

## 2024-03-11 ENCOUNTER — Encounter: Payer: Self-pay | Admitting: Radiation Oncology

## 2024-03-11 VITALS — BP 159/92 | HR 74 | Temp 97.5°F | Resp 20 | Ht 71.0 in | Wt 206.6 lb

## 2024-03-11 DIAGNOSIS — D420 Neoplasm of uncertain behavior of cerebral meninges: Secondary | ICD-10-CM | POA: Insufficient documentation

## 2024-03-11 DIAGNOSIS — E236 Other disorders of pituitary gland: Secondary | ICD-10-CM | POA: Insufficient documentation

## 2024-03-11 LAB — BASIC METABOLIC PANEL WITH GFR
BUN: 15 mg/dL (ref 7–25)
CO2: 24 mmol/L (ref 20–32)
Calcium: 9.5 mg/dL (ref 8.6–10.3)
Chloride: 104 mmol/L (ref 98–110)
Creat: 0.74 mg/dL (ref 0.70–1.35)
Glucose, Bld: 117 mg/dL — ABNORMAL HIGH (ref 65–99)
Potassium: 4.6 mmol/L (ref 3.5–5.3)
Sodium: 136 mmol/L (ref 135–146)
eGFR: 100 mL/min/1.73m2 (ref >=60)

## 2024-03-11 LAB — CBC WITH DIFFERENTIAL/PLATELET
Absolute Lymphocytes: 1927 {cells}/uL (ref 850–3900)
Absolute Monocytes: 555 {cells}/uL (ref 200–950)
Basophils Absolute: 56 {cells}/uL (ref 0–200)
Basophils Relative: 0.6 %
Eosinophils Absolute: 56 {cells}/uL (ref 15–500)
Eosinophils Relative: 0.6 %
HCT: 46.1 % (ref 38.5–50.0)
Hemoglobin: 15.6 g/dL (ref 13.2–17.1)
MCH: 32.8 pg (ref 27.0–33.0)
MCHC: 33.8 g/dL (ref 32.0–36.0)
MCV: 96.8 fL (ref 80.0–100.0)
MPV: 9.1 fL (ref 7.5–12.5)
Monocytes Relative: 5.9 %
Neutro Abs: 6806 {cells}/uL (ref 1500–7800)
Neutrophils Relative %: 72.4 %
Platelets: 314 Thousand/uL (ref 140–400)
RBC: 4.76 Million/uL (ref 4.20–5.80)
RDW: 13 % (ref 11.0–15.0)
Total Lymphocyte: 20.5 %
WBC: 9.4 Thousand/uL (ref 3.8–10.8)

## 2024-03-11 NOTE — Progress Notes (Addendum)
  Location/Histology of Brain Tumor:  Meningoma    Patient presented with symptoms of:    Patient noticed he began developing numbness on the right of his face. This was progressive and initially it was only in the lower part of his face but slowly crept up and involve the top part of his face as well. After this he also started noticing neuropathic pains in his face as well as on the inside of his mouth   Past or anticipated interventions, if any, per neurosurgery:  02/24/24 Rosslyn, MD   Past or anticipated interventions, if any, per medical oncology:  None   Dose of Decadron , if applicable:  None   Recent neurologic symptoms, if any:  Seizures: None Headaches: Experiences headaches every other day Nausea: None Dizziness/ataxia: None Difficulty with hand coordination: None Focal numbness/weakness: Yes, experiences numbness on right side of face Visual deficits/changes: Sometimes vision in right eye is blurry, uses eye drops to minimize side effects Confusion/Memory deficits: None   Painful bone metastases at present, if any: Having Hip and back pain intermittently.   SAFETY ISSUES: Prior radiation? None Pacemaker/ICD? None Possible current pregnancy? N/A Is the patient on methotrexate? None BP (!) 159/92 (BP Location: Left Arm, Patient Position: Sitting, Cuff Size: Large)   Pulse 74   Temp (!) 97.5 F (36.4 C)   Resp 20   Ht 5' 11 (1.803 m)   Wt 206 lb 9.6 oz (93.7 kg)   SpO2 98%   BMI 28.81 kg/m   Wt Readings from Last 3 Encounters:  03/11/24 206 lb 9.6 oz (93.7 kg)  03/10/24 208 lb (94.3 kg)  03/10/24 208 lb (94.3 kg)

## 2024-03-11 NOTE — Progress Notes (Signed)
 Radiation Oncology         [phone] ________________________________  Initial outpatient Consultation  Name: [name] MRN: [id number]  Date: 03/11/2024  DOB: April 06, 1956  RR:Ojonwiz, Norleen BROCKS, MD  Joyce Norleen BROCKS, MD   REFERRING PHYSICIAN: Joyce Norleen BROCKS, MD  DIAGNOSIS:    ICD-10-CM   1. Atypical meningioma of brain (HCC)  D42.0       CHIEF COMPLAINT: Here to discuss management of tumor  NARRATIVE:[name] is a 68 y.o. male who, as discussed at consult, presented to Dr. Tobie on 10/23/23 with complains of worsening right facial numbness/pain that has persisted for the prior 9 months.   To further investigate his findings, he underwent a face trigeminal MRI on 11/01/23 showing a soft tissue filling right Meckel's cave with associated enhancing mass measuring approximately 1.5 x 0.9 x 1.1 cm.scan also noted an additional component of the mass extending into the right ventrolateral aspect of the prepontine cistern which may involve the anterior aspect of the tentorial leaflet measuring approximately 1.3 x 0.5 x 0.6 cm.   The patient was discussed at our CNS tumor board  The consensus was that on imaging this appears most consistent with a meningioma.  The consensus was that the risks of biopsy outweigh potential benefits.  We discussed recommendations for fractionated stereotactic radiosurgery to treat this mass.  Up-to-date MRI for treatment planning purposes was ordered (3 Tesla)    Patient's 3T MRI from 02-05-24 was then reviewed at CNS tumor board. Consensus is that significant growth over 3 mo was not consistent with Grade I meningioma. Biopsy recommended.  I let [name] know not to come in for [facility] planning. He saw Dr Janjua for biopsy.  Pathology revealed: 02-24-24 FINAL MICROSCOPIC DIAGNOSIS:   A. BRAIN TUMOR, BIOPSY:       Meningioma, clear cell variant, WHO grade 2.   B. BRAIN TUMOR, EXCISION:       Meningioma, clear cell variant, WHO grade 2.    COMMENT:   The specimen shows sheets of cellular proliferation with clear cell  features and abundant admixed vasculature. There is no evidence of  necrosis. Mitotic figures are 1/10 high power fields. No brain is  present for evaluation of brain invasion.   Post op MRI shows debulking of tumor (02-25-24)  Dose of Decadron , if applicable:  None   Recent neurologic symptoms, if any:  Seizures: None Headaches: Experiences headaches every other day Nausea: None Dizziness/ataxia: None Difficulty with hand coordination: None Focal numbness/weakness: Yes, experiences numbness on right side of face Visual deficits/changes: Sometimes vision in right eye is blurry, uses eye drops to minimize side effects Confusion/Memory deficits: None   Painful bone metastases at present, if any: Having Hip and back pain intermittently.   SAFETY ISSUES: Prior radiation? None Pacemaker/ICD? None Possible current pregnancy? N/A Is the patient on methotrexate? None  PREVIOUS RADIATION THERAPY: No  PAST MEDICAL HISTORY:  has a past medical history of Anxiety, Arthritis, Candidiasis, mouth (03/22/2016), Carpal tunnel syndrome of right wrist (10/2014), Dental crowns present, Depression, Dyslipidemia, Ear fullness (08/30/2016), GERD (gastroesophageal reflux disease), Headache, Hypertension, Insulin  dependent diabetes mellitus, Nummular eczema, Obesity, Onychomycosis (08/30/2016), Recurrent infections (03/22/2016), and Sleep apnea.    PAST SURGICAL HISTORY: Past Surgical History:  Procedure Laterality Date   CARDIAC CATHETERIZATION  12/30/2006   mod. pulmonary HTN with preserved cardiac output and cardiac index   CARPAL TUNNEL RELEASE Right 10/14/2014   Procedure: RIGHT CARPAL TUNNEL RELEASE;  Surgeon: Franky Curia, MD;  Location: [facility];  Service: Orthopedics;  Laterality: Right;   CARPAL TUNNEL RELEASE Left 05/19/2015   Procedure: LEFT CARPAL TUNNEL RELEASE;  Surgeon: Franky Curia,  MD;  Location: [facility];  Service: Orthopedics;  Laterality: Left;   COLONOSCOPY     CRANIOTOMY Right 02/24/2024   Procedure: RIGHT POSTERIOR FOSSA CRANIOTOMY TUMOR RESECTION;  Surgeon: Rosslyn Dino HERO, MD;  Location: [facility];  Service: Neurosurgery;  Laterality: Right;   CYST EXCISION     scalp and back   ESOPHAGOGASTRODUODENOSCOPY  03/25/2007   FOOT SURGERY Left    revision traumatic amputation of foot x5   ROUX-EN-Y PROCEDURE  07/28/2007   with lysis of adhesions    FAMILY HISTORY: family history includes Arthritis in his paternal grandmother; Cancer (age of onset: 34) in his mother; Diabetes in his brother, father, and paternal grandmother; Heart disease in his mother and paternal grandmother; Heart disease (age of onset: 75) in his brother; Heart disease (age of onset: 37) in his brother; Stroke in his paternal grandmother.  SOCIAL HISTORY:  reports that he quit smoking about 16 years ago. His smoking use included cigarettes. He started smoking about 49 years ago. He has a 33 pack-year smoking history. He has never used smokeless tobacco. He reports that he does not currently use alcohol. He reports current drug use. Frequency: 7.00 times per week. Drug: Marijuana.  ALLERGIES: Hydrogen peroxide and Latex  MEDICATIONS:  Current Outpatient Medications  Medication Sig Dispense Refill   acetaminophen  (TYLENOL ) 500 MG tablet Take 500-1,000 mg by mouth every 6 (six) hours as needed (pain.).     Azelastine  HCl 137 MCG/SPRAY SOLN Place 1 spray into both nostrils daily. 30 mL 3   B Complex-C (B-COMPLEX WITH VITAMIN C) tablet Take 1 tablet by mouth 2 (two) times daily.     baclofen  (LIORESAL ) 10 MG tablet Take 1 tablet (10 mg total) by mouth 3 (three) times daily as needed for up to 20 days for muscle spasms. 30 tablet 1   buPROPion  (WELLBUTRIN  SR) 200 MG 12 hr tablet Take 1 tablet (200 mg total) by mouth 2 (two) times daily. 180 tablet 4   butalbital -acetaminophen -caffeine   (FIORICET ) 50-325-40 MG tablet Take 2 tablets by mouth every 4 (four) hours as needed for headache (Moderate pain). 30 tablet 0   CALCIUM  CITRATE-VITAMIN D PO Take 1 tablet by mouth 2 (two) times daily.     cetirizine (ZYRTEC) 10 MG tablet Take 10 mg by mouth in the morning.     Continuous Blood Gluc Sensor (FREESTYLE LIBRE 2 SENSOR) MISC Now on Freestyle Libre 3     Continuous Glucose Receiver (FREESTYLE LIBRE 2 READER) DEVI Scan as needed for continuous glucose monitoring.     cyanocobalamin (VITAMIN B12) 1000 MCG tablet Take 1,000 mcg by mouth in the morning.     DENTA 5000 PLUS 1.1 % CREA dental cream Place 1 Application onto teeth in the morning and at bedtime. BRUSH WITH PEASIZED AMOUNT TWICE DAILY THEN SPIT OUT, NO FOOD/DRINK FOR 30 MINUTES AFTER     desvenlafaxine  (PRISTIQ ) 50 MG 24 hr tablet 2 qam 180 tablet 0   empagliflozin (JARDIANCE) 25 MG TABS tablet Take 25 mg by mouth in the morning.     Ferrous Sulfate  (IRON  SLOW RELEASE) 142 (45 Fe) MG TBCR Take 1 tablet by mouth daily. 30 tablet 5   fluticasone  (FLONASE ) 50 MCG/ACT nasal spray USE 1 SPRAY NASALLY ONCE A  DAY FOR ALLERGIES (Patient taking differently: Place 2  sprays into both nostrils every evening.) 32 g 0   gabapentin  (NEURONTIN ) 300 MG capsule Take 1 capsule (300 mg) by mouth in the morning, take 1 capsule (300 mg) by mouth with supper & take 2 capsules (600 mg) by mouth at bedtime. 84 capsule 0   glucose blood test strip USE TO CHECK SUGAR 4 TIMES  DAILY.     insulin  glargine (LANTUS SOLOSTAR) 100 UNIT/ML Solostar Pen Inject 12 Units into the skin in the morning.     Insulin  Pen Needle 32G X 4 MM MISC Use to inject insulin  once a day or as directed     Insulin  Syringe-Needle U-100 (INSULIN  SYRINGE 1CC/31GX5/16) 31G X 5/16 1 ML MISC      levocetirizine (XYZAL ) 5 MG tablet TAKE 1 TABLET BY MOUTH AT  BEDTIME 90 tablet 0   lisinopril  (ZESTRIL ) 10 MG tablet Take 1 tablet (10 mg total) by mouth daily. 90 tablet 3   meclizine   (ANTIVERT ) 25 MG tablet Take 1 tablet (25 mg total) by mouth 3 (three) times daily as needed for dizziness. 30 tablet 0   metFORMIN (GLUCOPHAGE) 1000 MG tablet Take 1,000 mg by mouth 2 (two) times daily with a meal.     montelukast  (SINGULAIR ) 10 MG tablet Take 1 tablet (10 mg total) by mouth at bedtime. 90 tablet 3   Multiple Vitamin (MULTIVITAMIN WITH MINERALS) TABS tablet Take 1 tablet by mouth in the morning and at bedtime.     ONETOUCH DELICA LANCETS 33G MISC      ONETOUCH VERIO test strip      pilocarpine (SALAGEN) 5 MG tablet Take 5 mg by mouth 3 (three) times daily.     Polyethyl Glycol-Propyl Glycol (SYSTANE) 0.4-0.3 % SOLN Place 1 drop into both eyes 3 (three) times daily as needed (dry/irritated eyes.).     rosuvastatin  (CRESTOR ) 20 MG tablet TAKE 1 TABLET BY MOUTH DAILY (Patient taking differently: Take 20 mg by mouth every evening.) 90 tablet 2   tadalafil  (CIALIS ) 20 MG tablet Take 1 tablet (20 mg total) by mouth daily as needed for erectile dysfunction. 30 tablet 1   traZODone  (DESYREL ) 100 MG tablet Take 100 mg by mouth at bedtime as needed for sleep.     No current facility-administered medications for this encounter.    REVIEW OF SYSTEMS:  Notable for that above.   PHYSICAL EXAM:  height is 5' 11 (1.803 m) and weight is 206 lb 9.6 oz (93.7 kg). His temperature is 97.5 F (36.4 C) (abnormal). His blood pressure is 159/92 (abnormal) and his pulse is 74. His respiration is 20 and oxygen saturation is 98%.   General: Alert and oriented, in no acute distress   HEENT: Head is normocephalic. Extraocular movements are intact.  Skin: No concerning lesions. Musculoskeletal: symmetric strength and muscle tone throughout. Neurologic: Cranial nerves II through XII are grossly intact. No obvious focalities. Speech is fluent. Psychiatric: Judgment and insight are intact. Affect is appropriate.  KPS = 90  100 - Normal; no complaints; no evidence of disease. 90   - Able to carry on  normal activity; minor signs or symptoms of disease. 80   - Normal activity with effort; some signs or symptoms of disease. 53   - Cares for self; unable to carry on normal activity or to do active work. 60   - Requires occasional assistance, but is able to care for most of his personal needs. 50   - Requires considerable assistance and frequent medical care. 40   -  Disabled; requires special care and assistance. 30   - Severely disabled; hospital admission is indicated although death not imminent. 20   - Very sick; hospital admission necessary; active supportive treatment necessary. 10   - Moribund; fatal processes progressing rapidly. 0     - Dead  Karnofsky DA, Abelmann WH, Craver LS and Burchenal [facility] 2 [phone]) The use of the nitrogen mustards in the palliative treatment of carcinoma: with particular reference to bronchogenic carcinoma Cancer 1 634-56  ECOG = 1  0 - Asymptomatic (Fully active, able to carry on all predisease activities without restriction)  1 - Symptomatic but completely ambulatory (Restricted in physically strenuous activity but ambulatory and able to carry out work of a light or sedentary nature. For example, light housework, office work)  2 - Symptomatic, <50% in bed during the day (Ambulatory and capable of all self care but unable to carry out any work activities. Up and about more than 50% of waking hours)  3 - Symptomatic, >50% in bed, but not bedbound (Capable of only limited self-care, confined to bed or chair 50% or more of waking hours)  4 - Bedbound (Completely disabled. Cannot carry on any self-care. Totally confined to bed or chair)  5 - Death   Raylene MM, Creech RH, Tormey DC, et al. [phone]). Toxicity and response criteria of the [facility]. Am. DOROTHA Bridges. Oncol. 5 (6): 649-55   LABORATORY DATA:  Lab Results  Component Value Date   WBC 9.4 03/10/2024   HGB 15.6 03/10/2024   HCT 46.1 03/10/2024   MCV 96.8 03/10/2024   PLT 314  03/10/2024   CMP     Component Value Date/Time   NA 136 03/10/2024 1629   NA 143 10/29/2022 1302   K 4.6 03/10/2024 1629   CL 104 03/10/2024 1629   CO2 24 03/10/2024 1629   GLUCOSE 117 (H) 03/10/2024 1629   BUN 15 03/10/2024 1629   BUN 17 10/29/2022 1302   CREATININE 0.74 03/10/2024 1629   CALCIUM  9.5 03/10/2024 1629   PROT 6.2 10/24/2023 1156   PROT 6.3 07/03/2022 1630   ALBUMIN 4.4 07/03/2022 1630   AST 17 10/24/2023 1156   ALT 18 10/24/2023 1156   ALKPHOS 68 07/03/2022 1630   BILITOT 0.4 10/24/2023 1156   BILITOT 0.4 07/03/2022 1630   EGFR 100 03/10/2024 1629   EGFR 99 10/29/2022 1302   GFRNONAA >60 02/25/2024 0550   GFRNONAA >60 01/28/2024 1200        RADIOGRAPHY: As above, I personally reviewed his images    IMPRESSION/PLAN: Grade 2 meningioma, right Meckel's cave  I personally discussed this patient with his neurosurgeon, Dr. Rosslyn, as well as with the CNS tumor board.  The consensus is that given his Grade 2 meningioma biopsy results and interval tumor growth on MRIs, SRS is not appropriate. Instead, we recommend conventional radiation therapy over 6 weeks  with additional CTV margin to a total dose of 55.8Gy.  Today, I talked to the patient about his imaging, pathology, and tumor board recommendations.  We discussed the patient's diagnosis of Grade II meningioma and general treatment for this, highlighting the role of radiotherapy in the management.  We discussed the revised recommendations above.  We talked in detail about the immobilization mask and the expected experience of going through each treatment. We discussed the risks, benefits, and side effects of fractionated directed radiosurgery. Side effects may include but not necessarily be limited to: Skin irritation, fatigue, brain injury, cranial nerve injury;  no guarantees of treatment were given. A  new consent form was signed and placed in the patient's medical record.  The patient was encouraged to ask  questions that I answered to the best of my ability.  CT simulation will occur today and treatment will begin in a week. He is pleased with this plan and agreeable despite the fact that it will not be as convenient as radiosurgery; however, he understands why it is necessary to optimize his prognosis.  On date of service, in total, I spent 30 minutes on this encounter.   __________________________________________   Lauraine Golden, MD

## 2024-03-11 NOTE — Progress Notes (Signed)
 Has armband been applied?  Yes.    Does patient have an allergy to IV contrast dye?: No.   Has patient ever received premedication for IV contrast dye?: No.   Date of lab work: 03/10/2024 BUN: 15 CR: 0.74 eGFR: >60  Does patient take metformin?: Yes.    Is eGFR >60?: Yes.   If no, when can patient resume? (Must be 48 hrs AFTER they receive IV contrast):    IV site: Right AC  Has IV site been added to flowsheet?  Yes.    There were no vitals taken for this visit.

## 2024-03-12 ENCOUNTER — Telehealth: Payer: Self-pay

## 2024-03-13 ENCOUNTER — Other Ambulatory Visit: Payer: Self-pay

## 2024-03-13 ENCOUNTER — Telehealth: Payer: Self-pay

## 2024-03-13 ENCOUNTER — Telehealth: Payer: Self-pay | Admitting: Neurosurgery

## 2024-03-13 NOTE — Telephone Encounter (Signed)
 Patient called the office again requesting a return to work note for next Friday.    He says that he works in [employer] and will be working from home.    Patient stated that the letter can be sent to him via mychart.

## 2024-03-13 NOTE — Patient Instructions (Signed)
 Visit Information  Thank you for taking time to visit with me today. Please don't hesitate to contact me if I can be of assistance to you before our next scheduled telephone appointment.  Our next appointment is by telephone on 03/20/24  at 1100 am.   Following is a copy of your care plan:   Goals Addressed             This Visit's Progress    [facility] Transitions of Care (TOC) Care Plan   On track    Problems:  Recent Hospitalization for treatment of Right trigeminal schwannoma Knowledge Deficit Related to post craniectomy for tumor resection and No Hospital Follow Up Provider appointment : Patient to call PCP provider today to request hospital follow up or HFU post discharge visit after the rationale was explained. Declined Care Guide Assistance.  PCP HFU completed on 03/03/24.  Neurosurgical follow up on 03/11/24 to remove staples.  Completed and assessed dizziness, found to be orthostatic, HOLD BP meds for now Reported feeling much better already on 03/13/24 call.  03/20/24 Scheduled appt. With Radiology/Radiation Had initial visit and treatments to begin 03/20/24 and he was given instructions on what to expect.   Goal:  Over the next 30 days, the patient will not experience hospital readmission  Interventions:  Diabetes Interventions: Assessed patient's understanding of A1c goal: <6.5%:  Patient reports bad sensors on [facility] lately, has contacted company and reports he was told to find another system to use.  Will discuss with provider.  Patient latest Hgb A1c value 6.5 on 02/19/24.  Patient aware of this value.  Blood sugars averaging n 160's per patient stated on last call.  Today, 03/13/24: patient reports blood sugars via [employer] [facility] running 13-140 and he adjusts insulin  dose.  Lantus insulin  was increased from 12 units to 15 units starting a week after 03/03/24 provider follow up and change to nightly.  Reviewed medications with patient and discussed importance of  medication adherence. Discussed plans with patient for ongoing care management follow up and provided patient with direct contact information for care management team. Reviewed scheduled/upcoming provider appointments including:  Making a PCP hospital follow up appointment--Completed 03/03/24.  Review of patient status, including review of consultants reports, relevant laboratory and other test results, and medications completed. Assessed social determinant of health barriers and no changes noted/reported by patient.  Discussed daily monitoring of blood pressure three times a week and recording in phone or notebook to take to providers for trends versus spot checks in office visit.  03/03/24: dPCP office vitals recorded in office visit notes: BP 124/76   Pulse 64   Wt 208 lb 6.4 oz (94.5 kg)   SpO2 96%   BMI 29.07 kg/m . 03/10/24 provider visit with c/o of ongong dizziness and low blood pressure:  Orthostatic: performed blood pressure readings lying, sitting, and standing today.   Blood pressure supine is 120/62 with pulse of 65; blood pressure sitting is 128/80 with pulse of 65 ; blood pressure standing is 100/60 with pulse of 69.  His pulse is regular. To Hold blood pressure medicine as of 03/10/24.  Patient reports today, 03/13/24, that he already feels better, less dizzy.  Patient has not been taking blood pressures at home stating his blood pressure is taken at every office visit and has been good till 03/10/24 visit indicating some orthostatic blood pressure issues. To HOLD blood pressure medications for now.  03/10/24 Office Vitals:  Standing BP 100/63   Pulse 63  Ht 5' 11 (1.803 m)   Wt 208 lb (94.3 kg)   SpO2 98%   BMI 29.01 kg/m  Rationale for taking blood pressures at home while on blood pressure medication reviewed with patient.  To monitor for upward and downward trends in blood pressures.  Reviewed home equipment relative costs and where to purchase.  Patient to seek blood pressure  equipment for rationale/education discussed on home trend monitoring vs spot check in office settings.  To keep a log to take to providers along with any symptom tracking.   Lab Results  Component Value Date   HGBA1C 6.5 (H) 02/19/2024   Hypertension Interventions: Last practice recorded BP readings:  03/10/24: Standing BP 100/63 with reports of dizziness; BP medication on HOLD for now 03/13/24.  03/03/24 in PCP office: 124/76.  BP Readings from Last 3 Encounters:  02/26/24 114/68  02/19/24 127/69  02/11/24 111/68   Most recent eGFR/CrCl:  Lab Results  Component Value Date   EGFR 99 10/29/2022    No components found for: CRCL  Evaluation of current treatment plan related to hypertension self management and patient's adherence to plan as established by provider. Reviewed medications with patient and discussed importance of compliance. Patient is compliant with medications as reported per patient.  Discussed plans with patient for ongoing care management follow up and provided patient with direct contact information for care management team. Advised patient, providing education and rationale, to monitor blood pressure daily and record, calling PCP for findings outside established parameters. Reviewed scheduled/upcoming provider appointments including:  Neurosurgical follow up on 03/20/24 and radiation follow up on 03/11/24 which has been completed with start of treatments on a daily basis on 03/20/24.  Patient has transportation to these appointments.  Assessed social determinant of health barriers for any changes.  No changes or needs,    Surgery ( s/p craniectomy for brain tunor): Evaluation of current treatment plan related to craniectomy surgery. assessed patient/caregiver understanding of surgical procedure .  reviewed post-operative instructions with patient/caregiver, including what to call provider for.  addressed questions about post - surgical incision care.  Patient reports  itching of incision with staples present.  No other issues with incision reported.  Patient to contact neurosurgery office to see if anything can be done to address this.  Staples due to be removed 03/10/24.  Reviewed medications with patient and addressed questions.  Noted in change in Lantus dose to start a week after 03/03/24 PCP HFU.  Not reflected yet on Medications list, but a note was added.  Reviewed scheduled provider appointments with patient: YES. Confirmed availability of transportation to all appointments :Yes.  Patient Self Care Activities:  Attend all scheduled provider appointments Call pharmacy for medication refills 3-7 days in advance of running out of medications Call provider office for new concerns or questions  Notify RN Care Manager of TOC call rescheduling needs Participate in Transition of Care Program/Attend TOC scheduled calls Perform all self care activities independently  Take medications as prescribed   check blood pressure 3 times per week choose a place to take my blood pressure (home, clinic or office, retail store) write blood pressure results in a log or diary learn about high blood pressure take blood pressure log to all doctor appointments keep all doctor appointments take medications for blood pressure exactly as prescribed report new symptoms to your doctor  Plan:  A telephone outreach has been scheduled for 03/20/24 around 1100 am.  The patient has been provided with contact information for  the care management team and has been advised to call with any health related questions or concerns.    Bing Edison MSN, RN RN Case Sales executive Health  [facility] Office Hours M-F [phone] Direct Dial: [phone] Main Phone [phone]  Fax: [phone] [email or web address]           Patient verbalizes understanding of instructions and care plan provided today and agrees to view in MyChart. Active MyChart status and patient  understanding of how to access instructions and care plan via MyChart confirmed with patient.     Telephone follow up appointment with care management team member scheduled for:  Please call the care guide team at [phone] if you need to cancel or reschedule your appointment.   Please call the Suicide and Crisis Lifeline: 988 call 1-800-273-TALK (toll free, 24 hour hotline) call 911 if you are experiencing a Mental Health or Behavioral Health Crisis or need someone to talk to.   Bing Edison MSN, RN RN Case Sales executive [employer]  [facility] Office Hours M-F [phone] Direct Dial: [phone] Main Phone [phone]  Fax: [phone] [email or web address]

## 2024-03-13 NOTE — Telephone Encounter (Signed)
 Patient called to follow up from last appointment, he thought Dr Rosslyn was going to be sending him to rehab but he called and they said they havent received anything so he wanted to check to be sure.   He also wanted to thank Dr Rosslyn and said that he is feeling better and that Dr Rosslyn is even better than his reputation. He also said he really enjoyed the staff here and that everyone has been really kind to him.

## 2024-03-13 NOTE — Transitions of Care (Post Inpatient/ED Visit) (Signed)
 Transition of Care week 3  Visit Note  03/13/2024  Name: [name] MRN: [id number]          DOB: 01-31-1956  Situation: Patient enrolled in [facility] 30-day program. Visit completed with patient by telephone.   Background:   Initial Transition Care Management Follow-up Telephone Call    Past Medical History:  Diagnosis Date   Anxiety    Arthritis    back, shoulders, left great toe   Candidiasis, mouth 03/22/2016   Carpal tunnel syndrome of right wrist 10/2014   Dental crowns present    Depression    Dyslipidemia    Ear fullness 08/30/2016   GERD (gastroesophageal reflux disease)    Headache    Tension Headaches   Hypertension    under control with med., per pt.; has been on med. x 15-20 yr.   Insulin  dependent diabetes mellitus    Nummular eczema    Obesity    Onychomycosis 08/30/2016   Recurrent infections 03/22/2016   Sleep apnea    uses CPAP nightly    Assessment: Patient Reported Symptoms: Cognitive Cognitive Status: No symptoms reported, Alert and oriented to person, place, and time, Insightful and able to interpret abstract concepts, Normal speech and language skills      Neurological Neurological Review of Symptoms: Dizziness Neurological Management Strategies: Activity, Adequate rest, Coping strategies, Medication therapy, Routine screening Neurological Comment: Has a provider office visit on 03/10/24 for complaints of dizziness and low blood pressure, orthostatics taken and patient hold blood pressur medication since 03/10/24 and patient reports much improvment in these issues. Patient does not take blood pressure at home despite educations/rationale, Discussed this was a reason why as well to monitor for downwards trends as well. Discussed cost of pharmacy store machines and patient to seek a blood pressure cuff  HEENT HEENT Symptoms Reported: Change or loss of hearing, Eye dryness HEENT Management Strategies: Activity, Adequate rest, Coping strategies,  Medication therapy, Routine screening HEENT Self-Management Outcome: 4 (good)    Cardiovascular Cardiovascular Symptoms Reported: No symptoms reported    Respiratory Respiratory Symptoms Reported: No symptoms reported    Endocrine Endocrine Symptoms Reported: No symptoms reported Is patient diabetic?: Yes Is patient checking blood sugars at home?: Yes List most recent blood sugar readings, include date and time of day: Freestyle Libre: patient reports runs 130-140 and he has increased insulin  when this occurs despite what he has eaten. Endocrine Self-Management Outcome: 4 (good)  Gastrointestinal Gastrointestinal Symptoms Reported: No symptoms reported      Genitourinary Genitourinary Symptoms Reported: No symptoms reported    Integumentary Integumentary Symptoms Reported: Incision, No symptoms reported Additional Integumentary Details: NO issus reported today, staples have been removed. Skin Management Strategies: Activity, Coping strategies, Medication therapy, Routine screening  Musculoskeletal Musculoskelatal Symptoms Reviewed: Weakness Additional Musculoskeletal Details: Reports feeling better, walking outside without using cane but keeping it with him in case needed. To start neuro rehab Musculoskeletal Management Strategies: Activity, Coping strategies, Exercise, Routine screening, Medication therapy, Medical device Musculoskeletal Comment: Neuro Rehab cleared to start, referral made on discharge. Contact information given to patient today. Falls in the past year?: No Patient at Risk for Falls Due to: Impaired mobility, Other (Comment) (Recent hypotension/dizziness) Fall risk Follow up: Falls prevention discussed  Psychosocial Psychosocial Symptoms Reported: No symptoms reported Additional Psychological Details: Patient states he is doing very well getting stronger. walking around, feeling better. Behavioral Management Strategies: Activity, Abstinence from substances, Coping  strategies, Support system, Medication therapy Behavioral Health Self-Management Outcome: 4 (good) Major  Change/Loss/Stressor/Fears (CP): Medical condition, self Techniques to Cope with Loss/Stress/Change: Diversional activities, Counseling Do you feel physically threatened by others?: No   There were no vitals filed for this visit.  Medications Reviewed Today     Reviewed by Carolee Heron NOVAK, RN (Case Manager) on 03/13/24 at 1059  Med List Status: <None>   Medication Order Taking? Sig Documenting Provider Last Dose Status Informant  acetaminophen  (TYLENOL ) 500 MG tablet [id number] Yes Take 500-1,000 mg by mouth every 6 (six) hours as needed (pain.). [provider]  Active Self  Azelastine  HCl 137 MCG/SPRAY SOLN [id number] Yes Place 1 spray into both nostrils daily. Joyce Norleen BROCKS, MD  Active Self  B Complex-C (B-COMPLEX WITH VITAMIN C) tablet [id number] Yes Take 1 tablet by mouth 2 (two) times daily. [provider]  Active Self  baclofen  (LIORESAL ) 10 MG tablet [id number] Yes Take 1 tablet (10 mg total) by mouth 3 (three) times daily as needed for up to 20 days for muscle spasms. Claudene Toribio BROCKS, MD  Active   buPROPion  (WELLBUTRIN  SR) 200 MG 12 hr tablet [id number] Yes Take 1 tablet (200 mg total) by mouth 2 (two) times daily. Tasia Lung, MD  Active Self  butalbital -acetaminophen -caffeine  (FIORICET ) 50-325-40 MG tablet [id number]  Take 2 tablets by mouth every 4 (four) hours as needed for headache (Moderate pain).  Patient not taking: Reported on 03/13/2024   Claudene Toribio BROCKS, MD  Active   CALCIUM  CITRATE-VITAMIN D PO [id number] Yes Take 1 tablet by mouth 2 (two) times daily. [provider]  Active Self  cetirizine (ZYRTEC) 10 MG tablet [id number] Yes Take 10 mg by mouth in the morning. [provider]  Active Self  Continuous Blood Gluc Sensor (FREESTYLE LIBRE 2 SENSOR) MISC [id number] Yes Now on [facility] 3 [provider]  Active Self            Med Note SYDELL, HERON NOVAK   Fri Mar 13, 2024 10:58 AM) Will be seeking another CBG monitoring systems due to issues with the sensors patient reported.   Continuous Glucose Receiver (FREESTYLE LIBRE 2 READER) DEVI [id number] Yes Scan as needed for continuous glucose monitoring. [provider]  Active Self  cyanocobalamin (VITAMIN B12) 1000 MCG tablet [id number] Yes Take 1,000 mcg by mouth in the morning. [provider]  Active Self  DENTA 5000 PLUS 1.1 % CREA dental cream [id number] Yes Place 1 Application onto teeth in the morning and at bedtime. BRUSH WITH PEASIZED AMOUNT TWICE DAILY THEN SPIT OUT, NO FOOD/DRINK FOR 30 MINUTES AFTER [provider]  Active Self  desvenlafaxine  (PRISTIQ ) 50 MG 24 hr tablet [id number] Yes 2 qam Plovsky, Gerald, MD  Active Self  empagliflozin (JARDIANCE) 25 MG TABS tablet [id number] Yes Take 25 mg by mouth in the morning. [provider]  Active Self  Ferrous Sulfate  (IRON  SLOW RELEASE) 142 (45 Fe) MG TBCR [id number] Yes Take 1 tablet by mouth daily. Joyce Norleen BROCKS, MD  Active Self  fluticasone  (FLONASE ) 50 MCG/ACT nasal spray [id number] Yes USE 1 SPRAY NASALLY ONCE A  DAY FOR ALLERGIES  Patient taking differently: Place 2 sprays into both nostrils every evening.   Joyce Norleen BROCKS, MD  Active Self  gabapentin  (NEURONTIN ) 300 MG capsule [id number] Yes TAKE 1 CAPSULE BY MOUTH IN THE  MORNING , 1 CAPSULE WITH SUPPER  AND 2 CAPSULES AT BEDTIME Janjua, Rashid M, MD  Active   glucose blood test strip [id number] Yes USE TO CHECK SUGAR 4 TIMES  DAILY.  [provider]  Active Self  insulin  glargine (LANTUS SOLOSTAR) 100 UNIT/ML Solostar Pen [id number] Yes Inject 12 Units into the skin in the morning. [provider]  Active Self           Med Note SYDELL, HERON NOVAK   Thu Mar 05, 2024 10:15 AM) Noted in PCP notes that dose change to take place after on week and to change to nightly.   Insulin  Pen Needle 32G X 4 MM MISC [id number] Yes  Use to inject insulin  once a day or as directed [provider]  Active Self  Insulin  Syringe-Needle U-100 (INSULIN  SYRINGE 1CC/31GX5/16) 31G X 5/16 1 ML MISC [id number] Yes  [provider]  Active Self  levocetirizine (XYZAL ) 5 MG tablet [id number] Yes TAKE 1 TABLET BY MOUTH AT  BEDTIME Joyce Norleen BROCKS, MD  Active Self  lisinopril  (ZESTRIL ) 10 MG tablet [id number]  Take 1 tablet (10 mg total) by mouth daily.  Patient not taking: Reported on 03/13/2024   Joyce Norleen BROCKS, MD  Active Self  meclizine  (ANTIVERT ) 25 MG tablet [id number]  Take 1 tablet (25 mg total) by mouth 3 (three) times daily as needed for dizziness.  Patient not taking: Reported on 03/13/2024   Babcock, Peter E, NP  Active   metFORMIN (GLUCOPHAGE) 1000 MG tablet [id number] Yes Take 1,000 mg by mouth 2 (two) times daily with a meal. [provider]  Active Self  montelukast  (SINGULAIR ) 10 MG tablet [id number] Yes Take 1 tablet (10 mg total) by mouth at bedtime. Joyce Norleen BROCKS, MD  Active Self  Multiple Vitamin (MULTIVITAMIN WITH MINERALS) TABS tablet [id number] Yes Take 1 tablet by mouth in the morning and at bedtime. [provider]  Active Self  AISHA PASTOR [place] MISC [id number] Yes  [provider]  Active Self           Med Note LANITA CORDELLA SQUIBB   Dju Apr 05, 2018  4:31 PM)    AISHA SINKS test strip [id number] Yes  [provider]  Active Self           Med Note LANITA CORDELLA SQUIBB   Dju Apr 05, 2018  4:31 PM)    pilocarpine (SALAGEN) 5 MG tablet [id number] Yes Take 5 mg by mouth 3 (three) times daily. [provider]  Active Self  Polyethyl Glycol-Propyl Glycol (SYSTANE) 0.4-0.3 % SOLN [id number] Yes Place 1 drop into both eyes 3 (three) times daily as needed (dry/irritated eyes.). [provider]  Active Self  rosuvastatin  (CRESTOR ) 20 MG tablet [id number] Yes TAKE 1 TABLET BY MOUTH DAILY Joyce Norleen BROCKS, MD  Active Self  tadalafil  (CIALIS ) 20 MG tablet  [id number] Yes Take 1 tablet (20 mg total) by mouth daily as needed for erectile dysfunction. Joyce Norleen BROCKS, MD  Active Self  traZODone  (DESYREL ) 100 MG tablet [id number] Yes Take 100 mg by mouth at bedtime as needed for sleep. [provider]  Active Self           Med Note CLAUD, MICHEAL DASEN   Fri Feb 14, 2024 11:35 AM)              Goals Addressed             This Visit's Progress    [facility] Transitions of Care (TOC) Care Plan   On track    Problems:  Recent Hospitalization for treatment of Right trigeminal schwannoma Knowledge Deficit Related to post craniectomy for tumor resection and No Hospital  Follow Up Provider appointment : Patient to call PCP provider today to request hospital follow up or HFU post discharge visit after the rationale was explained. Declined Care Guide Assistance.  PCP HFU completed on 03/03/24.  Neurosurgical follow up on 03/11/24 to remove staples.  Completed and assessed dizziness, found to be orthostatic, HOLD BP meds for now Reported feeling much better already on 03/13/24 call.  03/20/24 Scheduled appt. With Radiology/Radiation Had initial visit and treatments to begin 03/20/24 and he was given instructions on what to expect.   Goal:  Over the next 30 days, the patient will not experience hospital readmission  Interventions:  Diabetes Interventions: Assessed patient's understanding of A1c goal: <6.5%:  Patient reports bad sensors on [facility] lately, has contacted company and reports he was told to find another system to use.  Will discuss with provider.  Patient latest Hgb A1c value 6.5 on 02/19/24.  Patient aware of this value.  Blood sugars averaging n 160's per patient stated on last call.  Today, 03/13/24: patient reports blood sugars via FreeStyle CBG running 13-140 and he adjusts insulin  dose.  Lantus insulin  was increased from 12 units to 15 units starting a week after 03/03/24 provider follow up and change to nightly.  Reviewed  medications with patient and discussed importance of medication adherence. Discussed plans with patient for ongoing care management follow up and provided patient with direct contact information for care management team. Reviewed scheduled/upcoming provider appointments including:  Making a PCP hospital follow up appointment--Completed 03/03/24.  Review of patient status, including review of consultants reports, relevant laboratory and other test results, and medications completed. Assessed social determinant of health barriers and no changes noted/reported by patient.  Discussed daily monitoring of blood pressure three times a week and recording in phone or notebook to take to providers for trends versus spot checks in office visit.  03/03/24: dPCP office vitals recorded in office visit notes: BP 124/76   Pulse 64   Wt 208 lb 6.4 oz (94.5 kg)   SpO2 96%   BMI 29.07 kg/m . 03/10/24 provider visit with c/o of ongong dizziness and low blood pressure:  Orthostatic: performed blood pressure readings lying, sitting, and standing today.   Blood pressure supine is 120/62 with pulse of 65; blood pressure sitting is 128/80 with pulse of 65 ; blood pressure standing is 100/60 with pulse of 69.  His pulse is regular. To Hold blood pressure medicine as of 03/10/24.  Patient reports today, 03/13/24, that he already feels better, less dizzy.  Patient has not been taking blood pressures at home stating his blood pressure is taken at every office visit and has been good till 03/10/24 visit indicating some orthostatic blood pressure issues. To HOLD blood pressure medications for now.  03/10/24 Office Vitals:  Standing BP 100/63   Pulse 63   Ht 5' 11 (1.803 m)   Wt 208 lb (94.3 kg)   SpO2 98%   BMI 29.01 kg/m  Rationale for taking blood pressures at home while on blood pressure medication reviewed with patient.  To monitor for upward and downward trends in blood pressures.  Reviewed home equipment relative costs and  where to purchase.  Patient to seek blood pressure equipment for rationale/education discussed on home trend monitoring vs spot check in office settings.  To keep a log to take to providers along with any symptom tracking.   Lab Results  Component Value Date   HGBA1C 6.5 (H) 02/19/2024   Hypertension Interventions: Last practice  recorded BP readings:  03/10/24: Standing BP 100/63 with reports of dizziness; BP medication on HOLD for now 03/13/24.  03/03/24 in PCP office: 124/76.  BP Readings from Last 3 Encounters:  02/26/24 114/68  02/19/24 127/69  02/11/24 111/68   Most recent eGFR/CrCl:  Lab Results  Component Value Date   EGFR 99 10/29/2022    No components found for: CRCL  Evaluation of current treatment plan related to hypertension self management and patient's adherence to plan as established by provider. Reviewed medications with patient and discussed importance of compliance. Patient is compliant with medications as reported per patient.  Discussed plans with patient for ongoing care management follow up and provided patient with direct contact information for care management team. Advised patient, providing education and rationale, to monitor blood pressure daily and record, calling PCP for findings outside established parameters. Reviewed scheduled/upcoming provider appointments including:  Neurosurgical follow up on 03/20/24 and radiation follow up on 03/11/24 which has been completed with start of treatments on a daily basis on 03/20/24.  Patient has transportation to these appointments.  Assessed social determinant of health barriers for any changes.  No changes or needs,     Surgery ( s/p craniectomy for brain tunor): Evaluation of current treatment plan related to craniectomy surgery. assessed patient/caregiver understanding of surgical procedure .  reviewed post-operative instructions with patient/caregiver, including what to call provider for.  addressed questions  about post - surgical incision care.  Patient reports itching of incision with staples present.  No other issues with incision reported.  Patient to contact neurosurgery office to see if anything can be done to address this.  Staples due to be removed 03/10/24.  Reviewed medications with patient and addressed questions.  Noted in change in Lantus dose to start a week after 03/03/24 PCP HFU.  Not reflected yet on Medications list, but a note was added.  Reviewed scheduled provider appointments with patient: YES.   Confirmed availability of transportation to all appointments :Yes.  Patient Self Care Activities:  Attend all scheduled provider appointments Call pharmacy for medication refills 3-7 days in advance of running out of medications Call provider office for new concerns or questions  Notify RN Care Manager of TOC call rescheduling needs Participate in Transition of Care Program/Attend TOC scheduled calls Perform all self care activities independently  Take medications as prescribed   check blood pressure 3 times per week choose a place to take my blood pressure (home, clinic or office, retail store) write blood pressure results in a log or diary learn about high blood pressure take blood pressure log to all doctor appointments keep all doctor appointments take medications for blood pressure exactly as prescribed report new symptoms to your doctor Contact neuro rehabilitation with number given by RN CM to set up appointments per referral and clearance to begin.    Plan:  A telephone outreach has been scheduled for 03/20/24 around 1100 am.  The patient has been provided with contact information for the care management team and has been advised to call with any health related questions or concerns.    Bing Edison MSN, RN RN Case Sales executive Health  [facility] Office Hours M-F [phone] Direct Dial: [phone] Main Phone [phone]  Fax:  [phone] [email or web address]           Recommendation:   Continue Current Plan of Care  Follow Up Plan:   Telephone follow-up in 1 week A telephone outreach has been scheduled for 03/20/24 around 1100 am.  Bing  Carolee MSN, RN RN Case Sales executive Health  [facility]Population [facility] Office Hours M-F [phone] Direct Dial: [phone] Main Phone [phone]  Fax: [phone] [email or web address]

## 2024-03-17 ENCOUNTER — Other Ambulatory Visit: Payer: Self-pay

## 2024-03-17 ENCOUNTER — Ambulatory Visit
Admission: RE | Admit: 2024-03-17 | Discharge: 2024-03-17 | Disposition: A | Discharge status: Home / Self Care | Source: Ambulatory Visit | Attending: Radiation Oncology | Admitting: Radiation Oncology

## 2024-03-17 ENCOUNTER — Telehealth: Payer: Self-pay | Admitting: Radiation Oncology

## 2024-03-17 ENCOUNTER — Encounter: Payer: Self-pay | Admitting: Radiation Oncology

## 2024-03-17 ENCOUNTER — Telehealth: Payer: Self-pay

## 2024-03-17 ENCOUNTER — Ambulatory Visit

## 2024-03-17 DIAGNOSIS — D420 Neoplasm of uncertain behavior of cerebral meninges: Secondary | ICD-10-CM

## 2024-03-17 DIAGNOSIS — E236 Other disorders of pituitary gland: Secondary | ICD-10-CM

## 2024-03-17 LAB — T4, FREE: Free T4: 0.61 ng/dL (ref 0.61–1.12)

## 2024-03-17 NOTE — Progress Notes (Signed)
 I have asked nursing to refer [name] to endocrinology for a consult about 2 mo post RT as the sella will be exposed to radiation therapy.  Nursing will also order these labs to precede  start of radiation  1) TSH  2)  Free  T4  3) GH  4) total testosterone   5) FSH  6) LH  7) IGF-1  8) ACTH   -----------------------------------  Lauraine Golden, MD

## 2024-03-17 NOTE — Telephone Encounter (Signed)
 Patients return to work form was placed on Dr. Catarino desk to provide signature.

## 2024-03-17 NOTE — Telephone Encounter (Signed)
 [phone] Faxed outgoing referral to Dr. Littie Caffey [facility]).  Also called patient,so he is aware be on standby for a call from Dr. Becki office for an consult.

## 2024-03-17 NOTE — Telephone Encounter (Signed)
 Letter sent to patient via MyChart. Form will be faxed to employer that was sent to our office with his leave paperwork. This was signed by Dr. Janjua.

## 2024-03-18 LAB — TESTOSTERONE: Testosterone: 345 ng/dL (ref 264–916)

## 2024-03-18 LAB — INSULIN-LIKE GROWTH FACTOR: Somatomedin C: 81 ng/mL (ref 59–230)

## 2024-03-18 LAB — LUTEINIZING HORMONE: LH: 22.4 m[IU]/mL — ABNORMAL HIGH (ref 1.7–8.6)

## 2024-03-18 LAB — TSH: TSH: 0.658 u[IU]/mL (ref 0.350–4.500)

## 2024-03-18 LAB — FOLLICLE STIMULATING HORMONE: FSH: 31 m[IU]/mL — ABNORMAL HIGH (ref 1.5–12.4)

## 2024-03-18 LAB — GROWTH HORMONE: Growth Hormone: 0.4 ng/mL (ref 0.0–10.0)

## 2024-03-19 ENCOUNTER — Telehealth: Payer: Self-pay | Admitting: Neurosurgery

## 2024-03-19 ENCOUNTER — Ambulatory Visit: Admitting: Radiation Oncology

## 2024-03-19 NOTE — Telephone Encounter (Signed)
 I called [name]. He states that his symptoms have been progressively getting worsening since he got home but has been noticed it more in the past few days.   He has had increased muscle spasms with right sided numbness that goes into the gums on the right side and ear like he had before surgery.   He states that he is having spells of dizziness that does not correlate with low blood pressure. I educated him about getting up slowly and watching his step due to increase risk for falls.   We discussed that he should take his gabapentin  and muscle relaxers as prescribed.   He wants to know if there is anything else Dr. Janjua recommends other than taking these medications and if he is okay to still go back to work on Monday 03/23/2024.

## 2024-03-19 NOTE — Telephone Encounter (Signed)
 Patient called and left voicemail and said that his symptoms have gotten a little worse and that he is going to increase his Gabapentin  intake to 200mg  twice a day and 300mg  at night, he said if that's not ok to call him and let him know. He also said that his radiation appointments for today and tomorrow were cancelled due to their machine being down and that it will start back on Monday.

## 2024-03-20 ENCOUNTER — Other Ambulatory Visit: Payer: Self-pay

## 2024-03-20 ENCOUNTER — Ambulatory Visit

## 2024-03-20 ENCOUNTER — Telehealth: Payer: Self-pay

## 2024-03-20 NOTE — Transitions of Care (Post Inpatient/ED Visit) (Signed)
 Transition of Care week 4  Visit Note  03/20/2024  Name: [name] MRN: [id number]          DOB: 1955-07-21  Situation: Patient enrolled in [facility] 30-day program. Visit completed with patient by telephone.   Background:   Initial Transition Care Management Follow-up Telephone Call    Past Medical History:  Diagnosis Date   Anxiety    Arthritis    back, shoulders, left great toe   Candidiasis, mouth 03/22/2016   Carpal tunnel syndrome of right wrist 10/2014   Dental crowns present    Depression    Dyslipidemia    Ear fullness 08/30/2016   GERD (gastroesophageal reflux disease)    Headache    Tension Headaches   Hypertension    under control with med., per pt.; has been on med. x 15-20 yr.   Insulin  dependent diabetes mellitus    Nummular eczema    Obesity    Onychomycosis 08/30/2016   Recurrent infections 03/22/2016   Sleep apnea    uses CPAP nightly    Assessment: Patient Reported Symptoms: Cognitive Cognitive Status: Alert and oriented to person, place, and time, Insightful and able to interpret abstract concepts, Normal speech and language skills   Health Maintenance Behaviors: Healthy diet, Exercise, Annual physical exam  Neurological Neurological Review of Symptoms: Dizziness Neurological Management Strategies: Activity, Adequate rest, Coping strategies, Medication therapy, Routine screening Neurological Self-Management Outcome: 3 (uncertain) Neurological Comment: Dizziness has worsened this past week and patient contacted PCP/neurosurgeon and was told it may get worse before it gets better s/p craniectomy for Type II meningioma per patient stated today on this call. Will see oncologist and an endocrinologist. Starts daily low dose radiation treatments Monday 03/23/24. He will return to work Monday at a [employer] job. Discussed fall/safety issues regarding general dizziness and orthostatic issues demonstrated in provider office visit. Patient has equipment to take  blood pressures at home, and has some he stated but not routinely.  HEENT HEENT Symptoms Reported: Change or loss of hearing, Eye dryness HEENT Management Strategies: Adequate rest, Coping strategies, Medication therapy, Routine screening HEENT Self-Management Outcome: 4 (good) HEENT Comment: Hearing issues but no issues over the phone, eye gtts for dry eyes.    Cardiovascular Cardiovascular Symptoms Reported: No symptoms reported Does patient have uncontrolled Hypertension?: No Cardiovascular Management Strategies: Adequate rest, Activity, Coping strategies, Medical device, Medication therapy, Routine screening Cardiovascular Self-Management Outcome: 4 (good) Cardiovascular Comment: Denies chest pain or shortness of breath.  Respiratory Respiratory Symptoms Reported: No symptoms reported Additional Respiratory Details: Denies chest pain or shortness of breath. Respiratory Self-Management Outcome: 4 (good)  Endocrine Endocrine Symptoms Reported: No symptoms reported Is patient diabetic?: Yes Is patient checking blood sugars at home?: Yes List most recent blood sugar readings, include date and time of day: Patient stated his [employer] are not working well and has to use finger stick method which he hates so he has not checked as much. Has contacted company with no sucess and will speak to endocrinologist about this. Endocrine Self-Management Outcome: 3 (uncertain)  Gastrointestinal Gastrointestinal Symptoms Reported: No symptoms reported      Genitourinary Genitourinary Symptoms Reported: No symptoms reported    Integumentary Additional Integumentary Details: Surgical site all healed per patient with no issues of itching, drainage, redness or unusual tenderness. Skin Management Strategies: Coping strategies, Routine screening Skin Self-Management Outcome: 4 (good)  Musculoskeletal Musculoskelatal Symptoms Reviewed: Weakness Additional Musculoskeletal Details: Dizziness makes him feel  weak. Has a quad cane and discussed fall/safety  issues around dealing with dizziness/orthostatic issues. Musculoskeletal Management Strategies: Activity, Adequate rest, Coping strategies, Medical device, Medication therapy, Routine screening Musculoskeletal Comment: Not goint to Neuro Rehab after all per patient. Falls in the past year?: No Patient at Risk for Falls Due to: Medication side effect, Impaired balance/gait, Other (Comment) (Dizziness/orthostatic issues.) Fall risk Follow up: Falls prevention discussed  Psychosocial Psychosocial Symptoms Reported: No symptoms reported Additional Psychological Details: Frustrated but accepting of issues related to current condition. Behavioral Management Strategies: Activity, Adequate rest, Coping strategies, Support system, Medication therapy, Optimal nutrition intake Behavioral Health Self-Management Outcome: 4 (good) Major Change/Loss/Stressor/Fears (CP): Medical condition, self Techniques to Cope with Loss/Stress/Change: Counseling, Diversional activities Do you feel physically threatened by others?: No   There were no vitals filed for this visit.  Medications Reviewed Today     Reviewed by Carolee Heron NOVAK, RN (Case Manager) on 03/20/24 at 1609  Med List Status: <None>   Medication Order Taking? Sig Documenting Provider Last Dose Status Informant  acetaminophen  (TYLENOL ) 500 MG tablet [id number] Yes Take 500-1,000 mg by mouth every 6 (six) hours as needed (pain.). [provider]  Active Self  Azelastine  HCl 137 MCG/SPRAY SOLN [id number] Yes Place 1 spray into both nostrils daily. Joyce Norleen BROCKS, MD  Active Self  B Complex-C (B-COMPLEX WITH VITAMIN C) tablet [id number] Yes Take 1 tablet by mouth 2 (two) times daily. [provider]  Active Self  baclofen  (LIORESAL ) 10 MG tablet [id number] Yes Take 10 mg by mouth 3 (three) times daily as needed for muscle spasms (for 21 days). [provider]  Active            Med Note  SYDELL, HERON NOVAK   Fri Mar 20, 2024  4:09 PM) Patient reported.   buPROPion  (WELLBUTRIN  SR) 200 MG 12 hr tablet [id number] Yes Take 1 tablet (200 mg total) by mouth 2 (two) times daily. Tasia Lung, MD  Active Self  butalbital -acetaminophen -caffeine  (FIORICET ) 50-325-40 MG tablet [id number]  Take 2 tablets by mouth every 4 (four) hours as needed for headache (Moderate pain).  Patient not taking: Reported on 03/20/2024   Claudene Toribio BROCKS, MD  Active   CALCIUM  CITRATE-VITAMIN D PO [id number] Yes Take 1 tablet by mouth 2 (two) times daily. [provider]  Active Self  cetirizine (ZYRTEC) 10 MG tablet [id number] Yes Take 10 mg by mouth in the morning. [provider]  Active Self  Continuous Blood Gluc Sensor (FREESTYLE LIBRE 2 SENSOR) MISC [id number] Yes Now on [facility] 3 [provider]  Active Self           Med Note SYDELL, HERON NOVAK   Fri Mar 13, 2024 10:58 AM) Will be seeking another CBG monitoring systems due to issues with the sensors patient reported.   Continuous Glucose Receiver (FREESTYLE LIBRE 2 READER) DEVI [id number] Yes Scan as needed for continuous glucose monitoring. [provider]  Active Self  cyanocobalamin (VITAMIN B12) 1000 MCG tablet [id number] Yes Take 1,000 mcg by mouth in the morning. [provider]  Active Self  DENTA 5000 PLUS 1.1 % CREA dental cream [id number] Yes Place 1 Application onto teeth in the morning and at bedtime. BRUSH WITH PEASIZED AMOUNT TWICE DAILY THEN SPIT OUT, NO FOOD/DRINK FOR 30 MINUTES AFTER [provider]  Active Self  desvenlafaxine  (PRISTIQ ) 50 MG 24 hr tablet [id number] Yes 2 qam Plovsky, Gerald, MD  Active Self  empagliflozin (JARDIANCE) 25 MG TABS tablet [id number] Yes Take 25 mg by mouth in the  morning. [provider]  Active Self  Ferrous Sulfate  (IRON  SLOW RELEASE) 142 (45 Fe) MG TBCR [id number] Yes Take 1 tablet by mouth daily. Joyce Norleen BROCKS, MD  Active Self  fluticasone  (FLONASE )  50 MCG/ACT nasal spray [id number]  USE 1 SPRAY NASALLY ONCE A  DAY FOR ALLERGIES  Patient taking differently: Place 2 sprays into both nostrils every evening.   Joyce Norleen BROCKS, MD  Active Self  gabapentin  (NEURONTIN ) 300 MG capsule [id number] Yes TAKE 1 CAPSULE BY MOUTH IN THE  MORNING , 1 CAPSULE WITH SUPPER  AND 2 CAPSULES AT BEDTIME Janjua, Rashid M, MD  Active   glucose blood test strip [id number] Yes USE TO CHECK SUGAR 4 TIMES  DAILY. [provider]  Active Self  insulin  glargine (LANTUS SOLOSTAR) 100 UNIT/ML Solostar Pen [id number] Yes Inject 12 Units into the skin in the morning. [provider]  Active Self           Med Note SYDELL, HERON NOVAK   Thu Mar 05, 2024 10:15 AM) Noted in PCP notes that dose change to take place after on week and to change to nightly.   Insulin  Pen Needle 32G X 4 MM MISC [id number] Yes Use to inject insulin  once a day or as directed [provider]  Active Self  Insulin  Syringe-Needle U-100 (INSULIN  SYRINGE 1CC/31GX5/16) 31G X 5/16 1 ML MISC [id number] Yes  [provider]  Active Self  levocetirizine (XYZAL ) 5 MG tablet [id number] Yes TAKE 1 TABLET BY MOUTH AT  BEDTIME Joyce Norleen BROCKS, MD  Active Self  lisinopril  (ZESTRIL ) 10 MG tablet [id number]  Take 1 tablet (10 mg total) by mouth daily.  Patient not taking: Reported on 03/20/2024   Joyce Norleen BROCKS, MD  Active Self  meclizine  (ANTIVERT ) 25 MG tablet [id number] Yes Take 1 tablet (25 mg total) by mouth 3 (three) times daily as needed for dizziness. Jenna Maude BRAVO, NP  Active   metFORMIN (GLUCOPHAGE) 1000 MG tablet [id number] Yes Take 1,000 mg by mouth 2 (two) times daily with a meal. [provider]  Active Self  montelukast  (SINGULAIR ) 10 MG tablet [id number] Yes Take 1 tablet (10 mg total) by mouth at bedtime. Joyce Norleen BROCKS, MD  Active Self  Multiple Vitamin (MULTIVITAMIN WITH MINERALS) TABS tablet [id number] Yes Take 1 tablet by mouth in the morning and at bedtime. [provider]  Active Self  AISHA PASTOR [place] MISC [id number] Yes  [provider]  Active Self           Med Note LANITA CORDELLA SQUIBB   Dju Apr 05, 2018  4:31 PM)    AISHA SINKS test strip [id number] Yes  [provider]  Active Self           Med Note LANITA CORDELLA SQUIBB   Dju Apr 05, 2018  4:31 PM)    pilocarpine (SALAGEN) 5 MG tablet [id number] Yes Take 5 mg by mouth 3 (three) times daily. [provider]  Active Self  Polyethyl Glycol-Propyl Glycol (SYSTANE) 0.4-0.3 % SOLN [id number] Yes Place 1 drop into both eyes 3 (three) times daily as needed (dry/irritated eyes.). [provider]  Active Self  rosuvastatin  (CRESTOR ) 20 MG tablet [id number] Yes TAKE 1 TABLET BY MOUTH DAILY Lalonde, John C, MD  Active Self  tadalafil  (CIALIS ) 20 MG tablet [id number] Yes Take 1 tablet (20 mg total) by mouth daily as needed for erectile dysfunction. Lalonde, John C, MD  Active Self  traZODone  (DESYREL )  100 MG tablet [id number] Yes Take 100 mg by mouth at bedtime as needed for sleep. [provider]  Active Self           Med Note CLAUD, MICHEAL DASEN   Fri Feb 14, 2024 11:35 AM)              Recommendation:   Continue Current Plan of Care   Follow Up Plan:   Closing From:  Transitions of Care Program: Per patient request for no further follow up calls due to daily radiation treatments beginning Monday 03/23/24 and also returning to work from home.   Bing Edison MSN, RN RN Case Sales executive Health  [facility] Office Hours M-F [phone] Direct Dial: [phone] Main Phone [phone]  Fax: [phone] [email or web address]

## 2024-03-20 NOTE — Telephone Encounter (Signed)
 Patient called. I relayed this information that Dr. Janjua responded with.

## 2024-03-21 NOTE — Patient Instructions (Signed)
 Discontinue Lisinopril  for a few days and stay well hydrated. Change positions slowly. Labs drawn and pending. We will call you tomorrow with results. Be sure to follow up with Dr. Joyce and you neurosurgeon.

## 2024-03-23 ENCOUNTER — Ambulatory Visit
Admission: RE | Admit: 2024-03-23 | Discharge: 2024-03-23 | Disposition: A | Discharge status: Home / Self Care | Payer: Self-pay | Source: Ambulatory Visit | Attending: Radiation Oncology

## 2024-03-23 ENCOUNTER — Other Ambulatory Visit: Payer: Self-pay

## 2024-03-23 ENCOUNTER — Ambulatory Visit
Admission: RE | Admit: 2024-03-23 | Discharge: 2024-03-23 | Disposition: A | Discharge status: Home / Self Care | Source: Ambulatory Visit | Attending: Radiation Oncology | Admitting: Radiation Oncology

## 2024-03-23 DIAGNOSIS — D420 Neoplasm of uncertain behavior of cerebral meninges: Secondary | ICD-10-CM | POA: Diagnosis not present

## 2024-03-23 LAB — RAD ONC ARIA SESSION SUMMARY
Course Elapsed Days: 0
Plan Fractions Treated to Date: 1
Plan Prescribed Dose Per Fraction: 1.8 Gy
Plan Total Fractions Prescribed: 31
Plan Total Prescribed Dose: 55.8 Gy
Reference Point Dosage Given to Date: 1.8 Gy
Reference Point Session Dosage Given: 1.8 Gy
Session Number: 1

## 2024-03-24 ENCOUNTER — Ambulatory Visit
Admission: RE | Admit: 2024-03-24 | Discharge: 2024-03-24 | Disposition: A | Discharge status: Home / Self Care | Source: Ambulatory Visit | Attending: Radiation Oncology | Admitting: Radiation Oncology

## 2024-03-24 ENCOUNTER — Other Ambulatory Visit: Payer: Self-pay

## 2024-03-24 ENCOUNTER — Telehealth: Payer: Self-pay

## 2024-03-24 DIAGNOSIS — D420 Neoplasm of uncertain behavior of cerebral meninges: Secondary | ICD-10-CM | POA: Diagnosis not present

## 2024-03-24 LAB — RAD ONC ARIA SESSION SUMMARY
Course Elapsed Days: 1
Plan Fractions Treated to Date: 2
Plan Prescribed Dose Per Fraction: 1.8 Gy
Plan Total Fractions Prescribed: 31
Plan Total Prescribed Dose: 55.8 Gy
Reference Point Dosage Given to Date: 3.6 Gy
Reference Point Session Dosage Given: 1.8 Gy
Session Number: 2

## 2024-03-24 NOTE — Telephone Encounter (Signed)
 Patient called to discuss his return to work. He is having a hard time concentrating.   I asked him to contact his boss to discuss if he can be out for longer. We talked about intermittent leave.   He will let us  know if he wants to be out of work for longer.

## 2024-03-25 ENCOUNTER — Other Ambulatory Visit: Payer: Self-pay

## 2024-03-25 ENCOUNTER — Ambulatory Visit
Admission: RE | Admit: 2024-03-25 | Discharge: 2024-03-25 | Disposition: A | Discharge status: Home / Self Care | Source: Ambulatory Visit | Attending: Radiation Oncology

## 2024-03-25 DIAGNOSIS — D420 Neoplasm of uncertain behavior of cerebral meninges: Secondary | ICD-10-CM | POA: Diagnosis not present

## 2024-03-25 LAB — RAD ONC ARIA SESSION SUMMARY
Course Elapsed Days: 2
Plan Fractions Treated to Date: 3
Plan Prescribed Dose Per Fraction: 1.8 Gy
Plan Total Fractions Prescribed: 31
Plan Total Prescribed Dose: 55.8 Gy
Reference Point Dosage Given to Date: 5.4 Gy
Reference Point Session Dosage Given: 1.8 Gy
Session Number: 3

## 2024-03-25 NOTE — Telephone Encounter (Signed)
 Pt called into and left message that he was at our office today at 10:00 am for his appointment and since we were not open to cancel his appointment. I called left message w/pt that his appoint,emt is tomorrow Wed. 05/25/2020 at 10:15 not today, we keep our door locked when we do not have clinic. and for  him to call back on what to do with his appointment of cancellation or not.

## 2024-03-26 ENCOUNTER — Telehealth: Payer: Self-pay | Admitting: Radiation Oncology

## 2024-03-26 ENCOUNTER — Ambulatory Visit
Admission: RE | Admit: 2024-03-26 | Discharge: 2024-03-26 | Disposition: A | Discharge status: Home / Self Care | Source: Ambulatory Visit | Attending: Radiation Oncology | Admitting: Radiation Oncology

## 2024-03-26 ENCOUNTER — Other Ambulatory Visit: Payer: Self-pay

## 2024-03-26 DIAGNOSIS — D420 Neoplasm of uncertain behavior of cerebral meninges: Secondary | ICD-10-CM | POA: Diagnosis not present

## 2024-03-26 LAB — RAD ONC ARIA SESSION SUMMARY
Course Elapsed Days: 3
Plan Fractions Treated to Date: 4
Plan Prescribed Dose Per Fraction: 1.8 Gy
Plan Total Fractions Prescribed: 31
Plan Total Prescribed Dose: 55.8 Gy
Reference Point Dosage Given to Date: 7.2 Gy
Reference Point Session Dosage Given: 1.8 Gy
Session Number: 4

## 2024-03-26 NOTE — Telephone Encounter (Signed)
 9/25 Left voicemail with Dr. Becki ref coordinator for date/time patient sch, if already sch for outgoing referral.

## 2024-03-27 ENCOUNTER — Ambulatory Visit
Admission: RE | Admit: 2024-03-27 | Discharge: 2024-03-27 | Disposition: A | Discharge status: Home / Self Care | Source: Ambulatory Visit | Attending: Radiation Oncology | Admitting: Radiation Oncology

## 2024-03-27 ENCOUNTER — Other Ambulatory Visit: Payer: Self-pay

## 2024-03-27 DIAGNOSIS — D420 Neoplasm of uncertain behavior of cerebral meninges: Secondary | ICD-10-CM | POA: Diagnosis not present

## 2024-03-27 LAB — RAD ONC ARIA SESSION SUMMARY
Course Elapsed Days: 4
Plan Fractions Treated to Date: 5
Plan Prescribed Dose Per Fraction: 1.8 Gy
Plan Total Fractions Prescribed: 31
Plan Total Prescribed Dose: 55.8 Gy
Reference Point Dosage Given to Date: 9 Gy
Reference Point Session Dosage Given: 1.8 Gy
Session Number: 5

## 2024-03-30 ENCOUNTER — Ambulatory Visit
Admission: RE | Admit: 2024-03-30 | Discharge: 2024-03-30 | Disposition: A | Discharge status: Home / Self Care | Source: Ambulatory Visit | Attending: Radiation Oncology | Admitting: Radiation Oncology

## 2024-03-30 ENCOUNTER — Other Ambulatory Visit: Payer: Self-pay

## 2024-03-30 ENCOUNTER — Other Ambulatory Visit: Payer: Self-pay | Admitting: Radiation Oncology

## 2024-03-30 ENCOUNTER — Other Ambulatory Visit: Payer: Self-pay | Admitting: Neurosurgery

## 2024-03-30 DIAGNOSIS — G509 Disorder of trigeminal nerve, unspecified: Secondary | ICD-10-CM

## 2024-03-30 DIAGNOSIS — D329 Benign neoplasm of meninges, unspecified: Secondary | ICD-10-CM

## 2024-03-30 DIAGNOSIS — D420 Neoplasm of uncertain behavior of cerebral meninges: Secondary | ICD-10-CM | POA: Diagnosis not present

## 2024-03-30 LAB — RAD ONC ARIA SESSION SUMMARY
Course Elapsed Days: 7
Plan Fractions Treated to Date: 6
Plan Prescribed Dose Per Fraction: 1.8 Gy
Plan Total Fractions Prescribed: 31
Plan Total Prescribed Dose: 55.8 Gy
Reference Point Dosage Given to Date: 10.8 Gy
Reference Point Session Dosage Given: 1.8 Gy
Session Number: 6

## 2024-03-30 MED ORDER — GABAPENTIN 300 MG PO CAPS: ORAL_CAPSULE | ORAL | 0 refills | Status: DC

## 2024-03-31 ENCOUNTER — Other Ambulatory Visit: Payer: Self-pay

## 2024-03-31 ENCOUNTER — Ambulatory Visit
Admission: RE | Admit: 2024-03-31 | Discharge: 2024-03-31 | Disposition: A | Source: Ambulatory Visit | Attending: Radiation Oncology

## 2024-03-31 DIAGNOSIS — D420 Neoplasm of uncertain behavior of cerebral meninges: Secondary | ICD-10-CM | POA: Diagnosis not present

## 2024-03-31 LAB — RAD ONC ARIA SESSION SUMMARY
Course Elapsed Days: 8
Plan Fractions Treated to Date: 7
Plan Prescribed Dose Per Fraction: 1.8 Gy
Plan Total Fractions Prescribed: 31
Plan Total Prescribed Dose: 55.8 Gy
Reference Point Dosage Given to Date: 11.9524 Gy
Reference Point Session Dosage Given: 1.1524 Gy
Session Number: 7

## 2024-03-31 NOTE — Telephone Encounter (Signed)
 Pt was able to get prescription with original dosing instructions (1 in the morning, 1 in the afternoon and 2 in the evening) until Dr. Janjua returns. Pt is currently taking that and is requesting a 90 dose supply from [facility].

## 2024-04-01 ENCOUNTER — Other Ambulatory Visit: Payer: Self-pay

## 2024-04-01 ENCOUNTER — Ambulatory Visit
Admission: RE | Admit: 2024-04-01 | Discharge: 2024-04-01 | Disposition: A | Discharge status: Home / Self Care | Source: Ambulatory Visit | Attending: Radiation Oncology | Admitting: Radiation Oncology

## 2024-04-01 DIAGNOSIS — D420 Neoplasm of uncertain behavior of cerebral meninges: Secondary | ICD-10-CM | POA: Insufficient documentation

## 2024-04-01 DIAGNOSIS — E236 Other disorders of pituitary gland: Secondary | ICD-10-CM | POA: Diagnosis present

## 2024-04-01 LAB — RAD ONC ARIA SESSION SUMMARY
Course Elapsed Days: 9
Plan Fractions Treated to Date: 8
Plan Prescribed Dose Per Fraction: 1.8 Gy
Plan Total Fractions Prescribed: 31
Plan Total Prescribed Dose: 55.8 Gy
Reference Point Dosage Given to Date: 13.7524 Gy
Reference Point Session Dosage Given: 1.8 Gy
Session Number: 8

## 2024-04-02 ENCOUNTER — Other Ambulatory Visit: Payer: Self-pay

## 2024-04-02 ENCOUNTER — Ambulatory Visit
Admission: RE | Admit: 2024-04-02 | Discharge: 2024-04-02 | Disposition: A | Discharge status: Home / Self Care | Source: Ambulatory Visit | Attending: Radiation Oncology | Admitting: Radiation Oncology

## 2024-04-02 ENCOUNTER — Telehealth: Payer: Self-pay | Admitting: Radiation Oncology

## 2024-04-02 DIAGNOSIS — D420 Neoplasm of uncertain behavior of cerebral meninges: Secondary | ICD-10-CM | POA: Diagnosis not present

## 2024-04-02 LAB — RAD ONC ARIA SESSION SUMMARY
Course Elapsed Days: 10
Plan Fractions Treated to Date: 9
Plan Prescribed Dose Per Fraction: 1.8 Gy
Plan Total Fractions Prescribed: 31
Plan Total Prescribed Dose: 55.8 Gy
Reference Point Dosage Given to Date: 15.5524 Gy
Reference Point Session Dosage Given: 1.8 Gy
Session Number: 9

## 2024-04-02 NOTE — Telephone Encounter (Signed)
 03/30/2024 by Izell Domino, MD. Renewing this prescription may not be appropriate.

## 2024-04-02 NOTE — Telephone Encounter (Signed)
 10/2 Follow up call to [facility], left voicemail with Dr. Becki ref coord, for update on patient if sch yet.

## 2024-04-02 NOTE — Telephone Encounter (Signed)
 Patient requesting a script with updated instructions of  intake 200mg  twice a day and 300mg  at night. He would like a 90 day supply sent via [employer] delivery.

## 2024-04-03 ENCOUNTER — Other Ambulatory Visit: Payer: Self-pay

## 2024-04-03 ENCOUNTER — Ambulatory Visit
Admission: RE | Admit: 2024-04-03 | Discharge: 2024-04-03 | Disposition: A | Discharge status: Home / Self Care | Source: Ambulatory Visit | Attending: Radiation Oncology | Admitting: Radiation Oncology

## 2024-04-03 DIAGNOSIS — D420 Neoplasm of uncertain behavior of cerebral meninges: Secondary | ICD-10-CM | POA: Diagnosis not present

## 2024-04-03 LAB — RAD ONC ARIA SESSION SUMMARY
Course Elapsed Days: 11
Plan Fractions Treated to Date: 10
Plan Prescribed Dose Per Fraction: 1.8 Gy
Plan Total Fractions Prescribed: 31
Plan Total Prescribed Dose: 55.8 Gy
Reference Point Dosage Given to Date: 17.3524 Gy
Reference Point Session Dosage Given: 1.8 Gy
Session Number: 10

## 2024-04-06 ENCOUNTER — Ambulatory Visit
Admission: RE | Admit: 2024-04-06 | Discharge: 2024-04-06 | Disposition: A | Discharge status: Home / Self Care | Source: Ambulatory Visit | Attending: Radiation Oncology | Admitting: Radiation Oncology

## 2024-04-06 ENCOUNTER — Other Ambulatory Visit: Payer: Self-pay

## 2024-04-06 DIAGNOSIS — D420 Neoplasm of uncertain behavior of cerebral meninges: Secondary | ICD-10-CM | POA: Diagnosis not present

## 2024-04-06 LAB — RAD ONC ARIA SESSION SUMMARY
Course Elapsed Days: 14
Plan Fractions Treated to Date: 11
Plan Prescribed Dose Per Fraction: 1.8 Gy
Plan Total Fractions Prescribed: 31
Plan Total Prescribed Dose: 55.8 Gy
Reference Point Dosage Given to Date: 19.1524 Gy
Reference Point Session Dosage Given: 1.8 Gy
Session Number: 11

## 2024-04-07 ENCOUNTER — Other Ambulatory Visit: Payer: Self-pay

## 2024-04-07 ENCOUNTER — Ambulatory Visit
Admission: RE | Admit: 2024-04-07 | Discharge: 2024-04-07 | Disposition: A | Discharge status: Home / Self Care | Source: Ambulatory Visit | Attending: Radiation Oncology | Admitting: Radiation Oncology

## 2024-04-07 ENCOUNTER — Ambulatory Visit: Admitting: Neurosurgery

## 2024-04-07 DIAGNOSIS — D420 Neoplasm of uncertain behavior of cerebral meninges: Secondary | ICD-10-CM | POA: Diagnosis not present

## 2024-04-07 DIAGNOSIS — D429 Neoplasm of uncertain behavior of meninges, unspecified: Secondary | ICD-10-CM

## 2024-04-07 LAB — RAD ONC ARIA SESSION SUMMARY
Course Elapsed Days: 15
Plan Fractions Treated to Date: 12
Plan Prescribed Dose Per Fraction: 1.8 Gy
Plan Total Fractions Prescribed: 31
Plan Total Prescribed Dose: 55.8 Gy
Reference Point Dosage Given to Date: 20.9524 Gy
Reference Point Session Dosage Given: 1.8 Gy
Session Number: 12

## 2024-04-08 ENCOUNTER — Ambulatory Visit
Admission: RE | Admit: 2024-04-08 | Discharge: 2024-04-08 | Disposition: A | Discharge status: Home / Self Care | Source: Ambulatory Visit | Attending: Radiation Oncology | Admitting: Radiation Oncology

## 2024-04-08 ENCOUNTER — Other Ambulatory Visit: Payer: Self-pay

## 2024-04-08 DIAGNOSIS — D420 Neoplasm of uncertain behavior of cerebral meninges: Secondary | ICD-10-CM | POA: Diagnosis not present

## 2024-04-08 LAB — RAD ONC ARIA SESSION SUMMARY
Course Elapsed Days: 16
Plan Fractions Treated to Date: 13
Plan Prescribed Dose Per Fraction: 1.8 Gy
Plan Total Fractions Prescribed: 31
Plan Total Prescribed Dose: 55.8 Gy
Reference Point Dosage Given to Date: 22.7524 Gy
Reference Point Session Dosage Given: 1.8 Gy
Session Number: 13

## 2024-04-09 ENCOUNTER — Other Ambulatory Visit: Payer: Self-pay

## 2024-04-09 ENCOUNTER — Ambulatory Visit
Admission: RE | Admit: 2024-04-09 | Discharge: 2024-04-09 | Disposition: A | Discharge status: Home / Self Care | Source: Ambulatory Visit | Attending: Radiation Oncology | Admitting: Radiation Oncology

## 2024-04-09 DIAGNOSIS — D420 Neoplasm of uncertain behavior of cerebral meninges: Secondary | ICD-10-CM | POA: Diagnosis not present

## 2024-04-09 LAB — RAD ONC ARIA SESSION SUMMARY
Course Elapsed Days: 17
Plan Fractions Treated to Date: 14
Plan Prescribed Dose Per Fraction: 1.8 Gy
Plan Total Fractions Prescribed: 31
Plan Total Prescribed Dose: 55.8 Gy
Reference Point Dosage Given to Date: 24.5524 Gy
Reference Point Session Dosage Given: 1.8 Gy
Session Number: 14

## 2024-04-10 ENCOUNTER — Other Ambulatory Visit: Payer: Self-pay

## 2024-04-10 ENCOUNTER — Ambulatory Visit
Admission: RE | Admit: 2024-04-10 | Discharge: 2024-04-10 | Disposition: A | Discharge status: Home / Self Care | Source: Ambulatory Visit | Attending: Radiation Oncology | Admitting: Radiation Oncology

## 2024-04-10 DIAGNOSIS — D420 Neoplasm of uncertain behavior of cerebral meninges: Secondary | ICD-10-CM | POA: Diagnosis not present

## 2024-04-10 LAB — RAD ONC ARIA SESSION SUMMARY
Course Elapsed Days: 18
Plan Fractions Treated to Date: 15
Plan Prescribed Dose Per Fraction: 1.8 Gy
Plan Total Fractions Prescribed: 31
Plan Total Prescribed Dose: 55.8 Gy
Reference Point Dosage Given to Date: 26.3524 Gy
Reference Point Session Dosage Given: 1.8 Gy
Session Number: 15

## 2024-04-10 NOTE — Progress Notes (Signed)
 Pt not seen

## 2024-04-13 ENCOUNTER — Telehealth: Payer: Self-pay | Admitting: Neurosurgery

## 2024-04-13 ENCOUNTER — Other Ambulatory Visit: Payer: Self-pay

## 2024-04-13 ENCOUNTER — Ambulatory Visit
Admission: RE | Admit: 2024-04-13 | Discharge: 2024-04-13 | Disposition: A | Source: Ambulatory Visit | Attending: Radiation Oncology

## 2024-04-13 ENCOUNTER — Ambulatory Visit
Admission: RE | Admit: 2024-04-13 | Discharge: 2024-04-13 | Disposition: A | Discharge status: Home / Self Care | Source: Ambulatory Visit | Attending: Radiation Oncology | Admitting: Radiation Oncology

## 2024-04-13 DIAGNOSIS — D420 Neoplasm of uncertain behavior of cerebral meninges: Secondary | ICD-10-CM | POA: Diagnosis not present

## 2024-04-13 LAB — RAD ONC ARIA SESSION SUMMARY
Course Elapsed Days: 21
Plan Fractions Treated to Date: 16
Plan Prescribed Dose Per Fraction: 1.8 Gy
Plan Total Fractions Prescribed: 31
Plan Total Prescribed Dose: 55.8 Gy
Reference Point Dosage Given to Date: 28.1524 Gy
Reference Point Session Dosage Given: 1.8 Gy
Session Number: 16

## 2024-04-13 NOTE — Telephone Encounter (Signed)
 Pt called to get refill of gabapentin  (NEURONTIN ) 300 MG capsule

## 2024-04-14 ENCOUNTER — Ambulatory Visit
Admission: RE | Admit: 2024-04-14 | Discharge: 2024-04-14 | Disposition: A | Discharge status: Home / Self Care | Source: Ambulatory Visit | Attending: Radiation Oncology | Admitting: Radiation Oncology

## 2024-04-14 ENCOUNTER — Other Ambulatory Visit: Payer: Self-pay

## 2024-04-14 DIAGNOSIS — D420 Neoplasm of uncertain behavior of cerebral meninges: Secondary | ICD-10-CM | POA: Diagnosis not present

## 2024-04-14 LAB — RAD ONC ARIA SESSION SUMMARY
Course Elapsed Days: 22
Plan Fractions Treated to Date: 17
Plan Prescribed Dose Per Fraction: 1.8 Gy
Plan Total Fractions Prescribed: 31
Plan Total Prescribed Dose: 55.8 Gy
Reference Point Dosage Given to Date: 29.9524 Gy
Reference Point Session Dosage Given: 1.8 Gy
Session Number: 17

## 2024-04-15 ENCOUNTER — Other Ambulatory Visit: Payer: Self-pay

## 2024-04-15 ENCOUNTER — Ambulatory Visit
Admission: RE | Admit: 2024-04-15 | Discharge: 2024-04-15 | Disposition: A | Discharge status: Home / Self Care | Source: Ambulatory Visit | Attending: Radiation Oncology | Admitting: Radiation Oncology

## 2024-04-15 DIAGNOSIS — D420 Neoplasm of uncertain behavior of cerebral meninges: Secondary | ICD-10-CM | POA: Diagnosis not present

## 2024-04-15 LAB — RAD ONC ARIA SESSION SUMMARY
Course Elapsed Days: 23
Plan Fractions Treated to Date: 18
Plan Prescribed Dose Per Fraction: 1.8 Gy
Plan Total Fractions Prescribed: 31
Plan Total Prescribed Dose: 55.8 Gy
Reference Point Dosage Given to Date: 31.7524 Gy
Reference Point Session Dosage Given: 1.8 Gy
Session Number: 18

## 2024-04-16 ENCOUNTER — Encounter: Admitting: Family Medicine

## 2024-04-16 ENCOUNTER — Other Ambulatory Visit: Payer: Self-pay

## 2024-04-16 ENCOUNTER — Ambulatory Visit
Admission: RE | Admit: 2024-04-16 | Discharge: 2024-04-16 | Disposition: A | Discharge status: Home / Self Care | Source: Ambulatory Visit | Attending: Radiation Oncology | Admitting: Radiation Oncology

## 2024-04-16 DIAGNOSIS — H25013 Cortical age-related cataract, bilateral: Secondary | ICD-10-CM

## 2024-04-16 DIAGNOSIS — F607 Dependent personality disorder: Secondary | ICD-10-CM

## 2024-04-16 DIAGNOSIS — G2581 Restless legs syndrome: Secondary | ICD-10-CM

## 2024-04-16 DIAGNOSIS — G4733 Obstructive sleep apnea (adult) (pediatric): Secondary | ICD-10-CM

## 2024-04-16 DIAGNOSIS — Z9884 Bariatric surgery status: Secondary | ICD-10-CM

## 2024-04-16 DIAGNOSIS — L2089 Other atopic dermatitis: Secondary | ICD-10-CM

## 2024-04-16 DIAGNOSIS — K219 Gastro-esophageal reflux disease without esophagitis: Secondary | ICD-10-CM

## 2024-04-16 DIAGNOSIS — Z0000 Encounter for general adult medical examination without abnormal findings: Secondary | ICD-10-CM

## 2024-04-16 DIAGNOSIS — Z23 Encounter for immunization: Secondary | ICD-10-CM

## 2024-04-16 DIAGNOSIS — B370 Candidal stomatitis: Secondary | ICD-10-CM

## 2024-04-16 DIAGNOSIS — D420 Neoplasm of uncertain behavior of cerebral meninges: Secondary | ICD-10-CM | POA: Diagnosis not present

## 2024-04-16 DIAGNOSIS — Z794 Long term (current) use of insulin: Secondary | ICD-10-CM

## 2024-04-16 DIAGNOSIS — F341 Dysthymic disorder: Secondary | ICD-10-CM

## 2024-04-16 DIAGNOSIS — E1169 Type 2 diabetes mellitus with other specified complication: Secondary | ICD-10-CM

## 2024-04-16 DIAGNOSIS — I152 Hypertension secondary to endocrine disorders: Secondary | ICD-10-CM

## 2024-04-16 DIAGNOSIS — J301 Allergic rhinitis due to pollen: Secondary | ICD-10-CM

## 2024-04-16 DIAGNOSIS — K140 Glossitis: Secondary | ICD-10-CM

## 2024-04-16 LAB — RAD ONC ARIA SESSION SUMMARY
Course Elapsed Days: 24
Plan Fractions Treated to Date: 19
Plan Prescribed Dose Per Fraction: 1.8 Gy
Plan Total Fractions Prescribed: 31
Plan Total Prescribed Dose: 55.8 Gy
Reference Point Dosage Given to Date: 33.5524 Gy
Reference Point Session Dosage Given: 1.8 Gy
Session Number: 19

## 2024-04-17 ENCOUNTER — Other Ambulatory Visit: Payer: Self-pay

## 2024-04-17 ENCOUNTER — Ambulatory Visit: Admitting: Neurosurgery

## 2024-04-17 ENCOUNTER — Encounter: Payer: Self-pay | Admitting: Neurosurgery

## 2024-04-17 ENCOUNTER — Ambulatory Visit
Admission: RE | Admit: 2024-04-17 | Discharge: 2024-04-17 | Disposition: A | Source: Ambulatory Visit | Attending: Radiation Oncology

## 2024-04-17 VITALS — BP 109/73 | HR 72 | Ht 71.0 in | Wt 218.0 lb

## 2024-04-17 DIAGNOSIS — D420 Neoplasm of uncertain behavior of cerebral meninges: Secondary | ICD-10-CM | POA: Diagnosis not present

## 2024-04-17 DIAGNOSIS — G509 Disorder of trigeminal nerve, unspecified: Secondary | ICD-10-CM

## 2024-04-17 DIAGNOSIS — D329 Benign neoplasm of meninges, unspecified: Secondary | ICD-10-CM

## 2024-04-17 DIAGNOSIS — D429 Neoplasm of uncertain behavior of meninges, unspecified: Secondary | ICD-10-CM

## 2024-04-17 LAB — RAD ONC ARIA SESSION SUMMARY
Course Elapsed Days: 25
Plan Fractions Treated to Date: 20
Plan Prescribed Dose Per Fraction: 1.8 Gy
Plan Total Fractions Prescribed: 31
Plan Total Prescribed Dose: 55.8 Gy
Reference Point Dosage Given to Date: 35.3524 Gy
Reference Point Session Dosage Given: 1.8 Gy
Session Number: 20

## 2024-04-17 MED ORDER — GABAPENTIN 300 MG PO CAPS: ORAL_CAPSULE | ORAL | 0 refills | Status: DC

## 2024-04-17 NOTE — Progress Notes (Signed)
 Pt left without being seen.

## 2024-04-18 ENCOUNTER — Other Ambulatory Visit: Payer: Self-pay | Admitting: Neurosurgery

## 2024-04-18 DIAGNOSIS — G509 Disorder of trigeminal nerve, unspecified: Secondary | ICD-10-CM

## 2024-04-18 DIAGNOSIS — D329 Benign neoplasm of meninges, unspecified: Secondary | ICD-10-CM

## 2024-04-20 ENCOUNTER — Ambulatory Visit

## 2024-04-20 NOTE — Telephone Encounter (Signed)
 Medication was refilled on 10/17 and sent to patients requested pharmacy. [facility] delivery never filled his earlier script.

## 2024-04-21 ENCOUNTER — Other Ambulatory Visit: Payer: Self-pay

## 2024-04-21 ENCOUNTER — Ambulatory Visit
Admission: RE | Admit: 2024-04-21 | Discharge: 2024-04-21 | Disposition: A | Discharge status: Home / Self Care | Source: Ambulatory Visit | Attending: Radiation Oncology | Admitting: Radiation Oncology

## 2024-04-21 ENCOUNTER — Ambulatory Visit
Admission: RE | Admit: 2024-04-21 | Discharge: 2024-04-21 | Disposition: A | Source: Ambulatory Visit | Attending: Radiation Oncology

## 2024-04-21 DIAGNOSIS — D420 Neoplasm of uncertain behavior of cerebral meninges: Secondary | ICD-10-CM | POA: Diagnosis not present

## 2024-04-21 LAB — RAD ONC ARIA SESSION SUMMARY
Course Elapsed Days: 29
Plan Fractions Treated to Date: 21
Plan Prescribed Dose Per Fraction: 1.8 Gy
Plan Total Fractions Prescribed: 31
Plan Total Prescribed Dose: 55.8 Gy
Reference Point Dosage Given to Date: 37.1524 Gy
Reference Point Session Dosage Given: 1.8 Gy
Session Number: 21

## 2024-04-22 ENCOUNTER — Ambulatory Visit
Admission: RE | Admit: 2024-04-22 | Discharge: 2024-04-22 | Disposition: A | Discharge status: Home / Self Care | Source: Ambulatory Visit | Attending: Radiation Oncology | Admitting: Radiation Oncology

## 2024-04-22 ENCOUNTER — Other Ambulatory Visit: Payer: Self-pay

## 2024-04-22 DIAGNOSIS — D420 Neoplasm of uncertain behavior of cerebral meninges: Secondary | ICD-10-CM | POA: Diagnosis not present

## 2024-04-22 LAB — RAD ONC ARIA SESSION SUMMARY
Course Elapsed Days: 30
Plan Fractions Treated to Date: 22
Plan Prescribed Dose Per Fraction: 1.8 Gy
Plan Total Fractions Prescribed: 31
Plan Total Prescribed Dose: 55.8 Gy
Reference Point Dosage Given to Date: 38.9524 Gy
Reference Point Session Dosage Given: 1.8 Gy
Session Number: 22

## 2024-04-23 ENCOUNTER — Other Ambulatory Visit: Payer: Self-pay

## 2024-04-23 ENCOUNTER — Ambulatory Visit
Admission: RE | Admit: 2024-04-23 | Discharge: 2024-04-23 | Disposition: A | Discharge status: Home / Self Care | Source: Ambulatory Visit | Attending: Radiation Oncology | Admitting: Radiation Oncology

## 2024-04-23 DIAGNOSIS — D420 Neoplasm of uncertain behavior of cerebral meninges: Secondary | ICD-10-CM | POA: Diagnosis not present

## 2024-04-23 LAB — RAD ONC ARIA SESSION SUMMARY
Course Elapsed Days: 31
Plan Fractions Treated to Date: 23
Plan Prescribed Dose Per Fraction: 1.8 Gy
Plan Total Fractions Prescribed: 31
Plan Total Prescribed Dose: 55.8 Gy
Reference Point Dosage Given to Date: 40.7524 Gy
Reference Point Session Dosage Given: 1.8 Gy
Session Number: 23

## 2024-04-24 ENCOUNTER — Ambulatory Visit
Admission: RE | Admit: 2024-04-24 | Discharge: 2024-04-24 | Disposition: A | Discharge status: Home / Self Care | Source: Ambulatory Visit | Attending: Radiation Oncology | Admitting: Radiation Oncology

## 2024-04-24 ENCOUNTER — Other Ambulatory Visit: Payer: Self-pay

## 2024-04-24 DIAGNOSIS — D420 Neoplasm of uncertain behavior of cerebral meninges: Secondary | ICD-10-CM | POA: Diagnosis not present

## 2024-04-24 LAB — RAD ONC ARIA SESSION SUMMARY
Course Elapsed Days: 32
Plan Fractions Treated to Date: 24
Plan Prescribed Dose Per Fraction: 1.8 Gy
Plan Total Fractions Prescribed: 31
Plan Total Prescribed Dose: 55.8 Gy
Reference Point Dosage Given to Date: 42.5524 Gy
Reference Point Session Dosage Given: 1.8 Gy
Session Number: 24

## 2024-04-27 ENCOUNTER — Other Ambulatory Visit: Payer: Self-pay

## 2024-04-27 ENCOUNTER — Ambulatory Visit
Admission: RE | Admit: 2024-04-27 | Discharge: 2024-04-27 | Disposition: A | Source: Ambulatory Visit | Attending: Radiation Oncology

## 2024-04-27 ENCOUNTER — Ambulatory Visit
Admission: RE | Admit: 2024-04-27 | Discharge: 2024-04-27 | Disposition: A | Discharge status: Home / Self Care | Source: Ambulatory Visit | Attending: Radiation Oncology | Admitting: Radiation Oncology

## 2024-04-27 DIAGNOSIS — D420 Neoplasm of uncertain behavior of cerebral meninges: Secondary | ICD-10-CM | POA: Diagnosis not present

## 2024-04-27 LAB — RAD ONC ARIA SESSION SUMMARY
Course Elapsed Days: 35
Plan Fractions Treated to Date: 25
Plan Prescribed Dose Per Fraction: 1.8 Gy
Plan Total Fractions Prescribed: 31
Plan Total Prescribed Dose: 55.8 Gy
Reference Point Dosage Given to Date: 44.3524 Gy
Reference Point Session Dosage Given: 1.8 Gy
Session Number: 25

## 2024-04-28 ENCOUNTER — Other Ambulatory Visit: Payer: Self-pay

## 2024-04-28 ENCOUNTER — Ambulatory Visit
Admission: RE | Admit: 2024-04-28 | Discharge: 2024-04-28 | Disposition: A | Source: Ambulatory Visit | Attending: Radiation Oncology

## 2024-04-28 DIAGNOSIS — D420 Neoplasm of uncertain behavior of cerebral meninges: Secondary | ICD-10-CM | POA: Diagnosis not present

## 2024-04-28 LAB — RAD ONC ARIA SESSION SUMMARY
Course Elapsed Days: 36
Plan Fractions Treated to Date: 26
Plan Prescribed Dose Per Fraction: 1.8 Gy
Plan Total Fractions Prescribed: 31
Plan Total Prescribed Dose: 55.8 Gy
Reference Point Dosage Given to Date: 46.1524 Gy
Reference Point Session Dosage Given: 1.8 Gy
Session Number: 26

## 2024-04-29 ENCOUNTER — Ambulatory Visit
Admission: RE | Admit: 2024-04-29 | Discharge: 2024-04-29 | Disposition: A | Discharge status: Home / Self Care | Source: Ambulatory Visit | Attending: Radiation Oncology | Admitting: Radiation Oncology

## 2024-04-29 ENCOUNTER — Other Ambulatory Visit: Payer: Self-pay

## 2024-04-29 DIAGNOSIS — D420 Neoplasm of uncertain behavior of cerebral meninges: Secondary | ICD-10-CM | POA: Diagnosis not present

## 2024-04-29 LAB — RAD ONC ARIA SESSION SUMMARY
Course Elapsed Days: 37
Plan Fractions Treated to Date: 27
Plan Prescribed Dose Per Fraction: 1.8 Gy
Plan Total Fractions Prescribed: 31
Plan Total Prescribed Dose: 55.8 Gy
Reference Point Dosage Given to Date: 47.9524 Gy
Reference Point Session Dosage Given: 1.8 Gy
Session Number: 27

## 2024-04-30 ENCOUNTER — Ambulatory Visit
Admission: RE | Admit: 2024-04-30 | Discharge: 2024-04-30 | Disposition: A | Discharge status: Home / Self Care | Source: Ambulatory Visit | Attending: Radiation Oncology | Admitting: Radiation Oncology

## 2024-04-30 ENCOUNTER — Ambulatory Visit

## 2024-04-30 ENCOUNTER — Other Ambulatory Visit: Payer: Self-pay

## 2024-04-30 DIAGNOSIS — D420 Neoplasm of uncertain behavior of cerebral meninges: Secondary | ICD-10-CM | POA: Diagnosis not present

## 2024-04-30 LAB — RAD ONC ARIA SESSION SUMMARY
Course Elapsed Days: 38
Plan Fractions Treated to Date: 28
Plan Prescribed Dose Per Fraction: 1.8 Gy
Plan Total Fractions Prescribed: 31
Plan Total Prescribed Dose: 55.8 Gy
Reference Point Dosage Given to Date: 49.7524 Gy
Reference Point Session Dosage Given: 1.8 Gy
Session Number: 28

## 2024-05-01 ENCOUNTER — Other Ambulatory Visit: Payer: Self-pay

## 2024-05-01 ENCOUNTER — Ambulatory Visit
Admission: RE | Admit: 2024-05-01 | Discharge: 2024-05-01 | Disposition: A | Discharge status: Home / Self Care | Source: Ambulatory Visit | Attending: Radiation Oncology | Admitting: Radiation Oncology

## 2024-05-01 DIAGNOSIS — D420 Neoplasm of uncertain behavior of cerebral meninges: Secondary | ICD-10-CM | POA: Diagnosis not present

## 2024-05-01 LAB — RAD ONC ARIA SESSION SUMMARY
Course Elapsed Days: 39
Plan Fractions Treated to Date: 29
Plan Prescribed Dose Per Fraction: 1.8 Gy
Plan Total Fractions Prescribed: 31
Plan Total Prescribed Dose: 55.8 Gy
Reference Point Dosage Given to Date: 51.5524 Gy
Reference Point Session Dosage Given: 1.8 Gy
Session Number: 29

## 2024-05-04 ENCOUNTER — Encounter: Payer: Self-pay | Admitting: Radiology

## 2024-05-04 ENCOUNTER — Other Ambulatory Visit: Payer: Self-pay

## 2024-05-04 ENCOUNTER — Ambulatory Visit
Admission: RE | Admit: 2024-05-04 | Discharge: 2024-05-04 | Disposition: A | Discharge status: Home / Self Care | Source: Ambulatory Visit | Attending: Radiation Oncology | Admitting: Radiation Oncology

## 2024-05-04 ENCOUNTER — Ambulatory Visit

## 2024-05-04 DIAGNOSIS — D420 Neoplasm of uncertain behavior of cerebral meninges: Secondary | ICD-10-CM | POA: Diagnosis present

## 2024-05-04 DIAGNOSIS — E236 Other disorders of pituitary gland: Secondary | ICD-10-CM | POA: Diagnosis present

## 2024-05-04 LAB — RAD ONC ARIA SESSION SUMMARY
Course Elapsed Days: 42
Plan Fractions Treated to Date: 30
Plan Prescribed Dose Per Fraction: 1.8 Gy
Plan Total Fractions Prescribed: 31
Plan Total Prescribed Dose: 55.8 Gy
Reference Point Dosage Given to Date: 53.3524 Gy
Reference Point Session Dosage Given: 1.8 Gy
Session Number: 30

## 2024-05-05 ENCOUNTER — Other Ambulatory Visit: Payer: Self-pay | Admitting: Radiation Therapy

## 2024-05-05 ENCOUNTER — Ambulatory Visit
Admission: RE | Admit: 2024-05-05 | Discharge: 2024-05-05 | Disposition: A | Discharge status: Home / Self Care | Source: Ambulatory Visit | Attending: Radiation Oncology | Admitting: Radiation Oncology

## 2024-05-05 ENCOUNTER — Ambulatory Visit

## 2024-05-05 ENCOUNTER — Other Ambulatory Visit: Payer: Self-pay

## 2024-05-05 DIAGNOSIS — D329 Benign neoplasm of meninges, unspecified: Secondary | ICD-10-CM

## 2024-05-05 DIAGNOSIS — D420 Neoplasm of uncertain behavior of cerebral meninges: Secondary | ICD-10-CM | POA: Diagnosis not present

## 2024-05-05 LAB — RAD ONC ARIA SESSION SUMMARY
Course Elapsed Days: 43
Plan Fractions Treated to Date: 1
Plan Fractions Treated to Date: 31
Plan Prescribed Dose Per Fraction: 0.648 Gy
Plan Prescribed Dose Per Fraction: 1.8 Gy
Plan Total Fractions Prescribed: 1
Plan Total Fractions Prescribed: 31
Plan Total Prescribed Dose: 0.648 Gy
Plan Total Prescribed Dose: 55.8 Gy
Reference Point Dosage Given to Date: 55.8004 Gy
Reference Point Session Dosage Given: 2.448 Gy
Session Number: 31

## 2024-05-06 ENCOUNTER — Ambulatory Visit

## 2024-05-06 ENCOUNTER — Ambulatory Visit (HOSPITAL_COMMUNITY): Admitting: Psychiatry

## 2024-05-06 NOTE — Radiation Completion Notes (Signed)
 Patient Name: [name], [name] MRN: [id number] Date of Birth: [birth year] Referring Physician: NORLEEN JOBS, M.D. Date of Service: 2024-05-06 Radiation Oncologist: Lauraine Golden, M.D. [facility]                             RADIATION ONCOLOGY END OF TREATMENT NOTE     Diagnosis: D42.0 Neoplasm of uncertain behavior of cerebral meninges Intent: Curative     ==========DELIVERED PLANS==========  First Treatment Date: 2024-03-23 Last Treatment Date: 2024-05-05   Plan Name: Brain_R_Menin Site: Brain Technique: IMRT Mode: Photon Dose Per Fraction: 1.8 Gy Prescribed Dose (Delivered / Prescribed): 55.15 Gy / 55.8 Gy Prescribed Fxs (Delivered / Prescribed): 31 / 31   Plan Name: Brain_Partial Site: Brain Technique: IMRT Mode: Photon Dose Per Fraction: 0.65 Gy Prescribed Dose (Delivered / Prescribed): 0.65 Gy / 0.65 Gy Prescribed Fxs (Delivered / Prescribed): 1 / 1     ==========ON TREATMENT VISIT DATES========== 2024-03-23, 2024-03-30, 2024-04-06, 2024-04-13, 2024-04-21, 2024-04-27, 2024-05-04     ==========UPCOMING VISITS========== 06/19/2024 [facility] Vita Morrow, MD  06/09/2024 [facility] FOLLOW UP 30 Wyatt Leeroy HERO, DEVONNA  05/06/2024 [facility] [place] OFFICE VISIT 30 Plovsky, Elna, MD        ==========APPENDIX - ON TREATMENT VISIT NOTES==========   See weekly On Treatment Notes in Epic for details in the Media tab (listed as Progress notes on the On Treatment Visit Dates listed above).

## 2024-05-07 ENCOUNTER — Telehealth: Payer: Self-pay

## 2024-05-07 NOTE — Telephone Encounter (Signed)
 Return the pt phone call in regards of his disability forms. Pt state that the forms were faxed to our office. I let his know we hadn't received them yet. H e stated that he would call them to let then , our turn around time for having the forms completed. I let him know that I will keep a look out on his forms. Pt verbalized understanding.

## 2024-05-08 ENCOUNTER — Telehealth: Admitting: Neurosurgery

## 2024-05-08 ENCOUNTER — Encounter: Admitting: Neurosurgery

## 2024-05-14 ENCOUNTER — Telehealth: Payer: Self-pay | Admitting: *Deleted

## 2024-05-14 NOTE — Telephone Encounter (Signed)
 05/13/2024: [employer] Short-Term Disability form completed.  Sent to provider for review signature and return. 11-13/2025: Connected with Lam L Goodine, [phone] (home) to request he sign [facility] HIPAA emailed  through [employer] to:  [email or web address].  Confirmed start date of disability as 02/24/2024.  Will return signed ROI later today.

## 2024-05-20 ENCOUNTER — Encounter: Admitting: Family Medicine

## 2024-05-20 NOTE — Telephone Encounter (Signed)
 05/13/2024: Form completed 05/13/2024 to provider for review. 05/15/2024 Received paperwork signed by provider.  Successfully returned via fax to [phone].  No records requested.  Form to [facility] HIM bin for items to be scammed.  Copy prepared to be mailed to address on file. [address] [place] [address] Form process completed with no further instructions received, actions performed or required by this nurse.

## 2024-06-08 ENCOUNTER — Encounter: Payer: Self-pay | Admitting: *Deleted

## 2024-06-09 ENCOUNTER — Ambulatory Visit
Admission: RE | Admit: 2024-06-09 | Discharge: 2024-06-09 | Disposition: A | Source: Ambulatory Visit | Attending: Radiology | Admitting: Radiology

## 2024-06-09 ENCOUNTER — Encounter: Payer: Self-pay | Admitting: Radiology

## 2024-06-09 VITALS — BP 125/84 | HR 80 | Temp 97.9°F | Resp 18 | Ht 71.0 in | Wt 215.8 lb

## 2024-06-09 DIAGNOSIS — D420 Neoplasm of uncertain behavior of cerebral meninges: Secondary | ICD-10-CM

## 2024-06-09 HISTORY — DX: Personal history of irradiation: Z92.3

## 2024-06-09 NOTE — Progress Notes (Signed)
 Radiation Oncology         [phone] ________________________________  Name: [name] MRN: [id number]  Date: 06/09/2024  DOB: [birth year]  Follow-Up Visit Note  Outpatient  CC: Joyce Norleen BROCKS, MD  Joyce Norleen BROCKS, MD  Diagnosis:  Atypical meningioma of brain [facility])  Grade 2 meningioma, right Meckel's cave; s/p radiation completed on 05/05/2024   CHIEF COMPLAINT: Here for follow-up and surveillance of meningioma  Interval Since Last Radiation:  1 month  ==========DELIVERED PLANS==========  First Treatment Date: 2024-03-23 Last Treatment Date: 2024-05-05   Plan Name: Brain_R_Menin Site: Brain Technique: IMRT Mode: Photon Dose Per Fraction: 1.8 Gy Prescribed Dose (Delivered / Prescribed): 55.15 Gy / 55.8 Gy Prescribed Fxs (Delivered / Prescribed): 31 / 31   Plan Name: Brain_Partial Site: Brain Technique: IMRT Mode: Photon Dose Per Fraction: 0.65 Gy Prescribed Dose (Delivered / Prescribed): 0.65 Gy / 0.65 Gy Prescribed Fxs (Delivered / Prescribed): 1 / 1  Narrative:  The patient returns today for routine follow-up. He completed his treatment approximately 1 month ago.         Symptomatically, denies  seizures, headaches, nausea, new neurologic deficits, or visual changes. He notes mild short term memory loss, such as forgetting what someone had said to him earlier or where he had placed an object. He feels as is this has continued to improve.    ALLERGIES:  is allergic to hydrogen peroxide and latex.  Meds: Current Outpatient Medications  Medication Sig Dispense Refill   gabapentin  (NEURONTIN ) 300 MG capsule TAKE 1 CAPSULE BY MOUTH IN THE  MORNING , 1 CAPSULE WITH SUPPER  AND 2 CAPSULES AT BEDTIME 84 capsule 0   pilocarpine (SALAGEN) 5 MG tablet Take 5 mg by mouth 3 (three) times daily.     acetaminophen  (TYLENOL ) 500 MG tablet Take 500-1,000 mg by mouth every 6 (six) hours as needed (pain.).     Azelastine  HCl 137 MCG/SPRAY SOLN Place 1 spray into both  nostrils daily. 30 mL 3   B Complex-C (B-COMPLEX WITH VITAMIN C) tablet Take 1 tablet by mouth 2 (two) times daily.     baclofen  (LIORESAL ) 10 MG tablet Take 10 mg by mouth 3 (three) times daily as needed for muscle spasms (for 21 days).     buPROPion  (WELLBUTRIN  SR) 200 MG 12 hr tablet Take 1 tablet (200 mg total) by mouth 2 (two) times daily. 180 tablet 4   butalbital -acetaminophen -caffeine  (FIORICET ) 50-325-40 MG tablet Take 2 tablets by mouth every 4 (four) hours as needed for headache (Moderate pain). 30 tablet 0   CALCIUM  CITRATE-VITAMIN D PO Take 1 tablet by mouth 2 (two) times daily.     cetirizine (ZYRTEC) 10 MG tablet Take 10 mg by mouth in the morning.     Continuous Blood Gluc Sensor (FREESTYLE LIBRE 2 SENSOR) MISC Now on Freestyle Libre 3     Continuous Glucose Receiver (FREESTYLE LIBRE 2 READER) DEVI Scan as needed for continuous glucose monitoring.     cyanocobalamin (VITAMIN B12) 1000 MCG tablet Take 1,000 mcg by mouth in the morning.     DENTA 5000 PLUS 1.1 % CREA dental cream Place 1 Application onto teeth in the morning and at bedtime. BRUSH WITH PEASIZED AMOUNT TWICE DAILY THEN SPIT OUT, NO FOOD/DRINK FOR 30 MINUTES AFTER     desvenlafaxine  (PRISTIQ ) 50 MG 24 hr tablet 2 qam 180 tablet 0   empagliflozin (JARDIANCE) 25 MG TABS tablet Take 25 mg by mouth in the morning.     Ferrous  Sulfate (IRON  SLOW RELEASE) 142 (45 Fe) MG TBCR Take 1 tablet by mouth daily. 30 tablet 5   fluticasone  (FLONASE ) 50 MCG/ACT nasal spray USE 1 SPRAY NASALLY ONCE A  DAY FOR ALLERGIES 32 g 0   glucose blood test strip USE TO CHECK SUGAR 4 TIMES  DAILY.     insulin  glargine (LANTUS SOLOSTAR) 100 UNIT/ML Solostar Pen Inject 12 Units into the skin in the morning.     Insulin  Pen Needle 32G X 4 MM MISC Use to inject insulin  once a day or as directed     Insulin  Syringe-Needle U-100 (INSULIN  SYRINGE 1CC/31GX5/16) 31G X 5/16 1 ML MISC      levocetirizine (XYZAL ) 5 MG tablet TAKE 1 TABLET BY MOUTH AT   BEDTIME 90 tablet 0   lisinopril  (ZESTRIL ) 10 MG tablet Take 1 tablet (10 mg total) by mouth daily. 90 tablet 3   meclizine  (ANTIVERT ) 25 MG tablet Take 1 tablet (25 mg total) by mouth 3 (three) times daily as needed for dizziness. (Patient not taking: Reported on 06/09/2024) 30 tablet 0   metFORMIN (GLUCOPHAGE) 1000 MG tablet Take 1,000 mg by mouth 2 (two) times daily with a meal.     montelukast  (SINGULAIR ) 10 MG tablet Take 1 tablet (10 mg total) by mouth at bedtime. 90 tablet 3   Multiple Vitamin (MULTIVITAMIN WITH MINERALS) TABS tablet Take 1 tablet by mouth in the morning and at bedtime.     ONETOUCH DELICA LANCETS 33G MISC      ONETOUCH VERIO test strip      Polyethyl Glycol-Propyl Glycol (SYSTANE) 0.4-0.3 % SOLN Place 1 drop into both eyes 3 (three) times daily as needed (dry/irritated eyes.).     rosuvastatin  (CRESTOR ) 20 MG tablet TAKE 1 TABLET BY MOUTH DAILY 90 tablet 2   tadalafil  (CIALIS ) 20 MG tablet Take 1 tablet (20 mg total) by mouth daily as needed for erectile dysfunction. 30 tablet 1   traZODone  (DESYREL ) 100 MG tablet Take 100 mg by mouth at bedtime as needed for sleep. (Patient not taking: Reported on 06/09/2024)     No current facility-administered medications for this encounter.    Physical Findings: The patient is in no acute distress. Patient is alert and oriented.  height is 5' 11 (1.803 m) and weight is 215 lb 12.8 oz (97.9 kg). His temperature is 97.9 F (36.6 C). His blood pressure is 125/84 and his pulse is 80. His respiration is 18 and oxygen saturation is 100%. .  No significant changes.   General: Alert and oriented, in no acute distress HEENT: Head is normocephalic. Extraocular movements are intact. Well healed surgical incision posterior to the right ear.  Neck: Neck is supple, no palpable cervical or supraclavicular lymphadenopathy. Heart: Regular in rate and rhythm with no murmurs, rubs, or gallops. Chest: Clear to auscultation bilaterally, with no  rhonchi, wheezes, or rales. Abdomen: Soft, nontender, nondistended, with no rigidity or guarding. Extremities: No cyanosis or edema. Lymphatics: see Neck Exam Skin: No concerning lesions. Musculoskeletal: symmetric strength and muscle tone throughout. 5/5 strength in the bilateral upper and lower extremities.  Neurologic: Cranial nerves II through XII are grossly intact. No obvious focalities. Speech is fluent. Coordination is intact. Psychiatric: Judgment and insight are intact. Affect is appropriate.   Lab Findings: Lab Results  Component Value Date   WBC 9.4 03/10/2024   HGB 15.6 03/10/2024   HCT 46.1 03/10/2024   MCV 96.8 03/10/2024   PLT 314 03/10/2024    @LASTCHEMISTRY @  Radiographic  Findings: No results found.  Impression/Plan:  Grade 2 meningioma, right Meckel's cave   Patient is healing well from the effects of his radiation treatment. We discussed ways to help with brain fog including plenty of sleep and exercise.   He is scheduled for MRI of the brain on 08/05/2024. Patient will follow-up with Dr. Buckley to review the results. Radiation PRN. We appreciate the opportunity to take part in this patient's care. He was encouraged to call with any questions or concerns in the meantime.   On date of service, in total, I spent 20 minutes on this encounter. Patient was seen in person.  _____________________________________    Leeroy Due, PA-C

## 2024-06-09 NOTE — Progress Notes (Signed)
 [name] is here today to see Leeroy Due PA-C for follow up post radiation to the brain.    They completed their radiation on: 05/05/24  Does the patient complain of any of the following: Headache: Denies Visual Changes: Denies Hearing Changes: He states that his right ear has improved post radiation. Nausea: Denies Vomiting: Denies Balance or coordination issues: Yes Memory issues: He reports memory has improved.  Is the patient currently on a Decadron  regimen? : No  Additional comments if applicable: None at this time.   BP 125/84 (BP Location: Left Arm, Patient Position: Sitting, Cuff Size: Large)   Pulse 80   Temp 97.9 F (36.6 C)   Resp 18   Ht 5' 11 (1.803 m)   Wt 215 lb 12.8 oz (97.9 kg)   SpO2 100%   BMI 30.10 kg/m

## 2024-06-11 ENCOUNTER — Other Ambulatory Visit (HOSPITAL_COMMUNITY): Payer: Self-pay | Admitting: Psychiatry

## 2024-06-18 ENCOUNTER — Other Ambulatory Visit: Payer: Self-pay | Admitting: Neurosurgery

## 2024-06-18 DIAGNOSIS — D329 Benign neoplasm of meninges, unspecified: Secondary | ICD-10-CM

## 2024-06-18 DIAGNOSIS — G509 Disorder of trigeminal nerve, unspecified: Secondary | ICD-10-CM

## 2024-06-18 NOTE — Telephone Encounter (Signed)
 Message sent to patient to make sure that request was not an automatic refill request.

## 2024-06-19 ENCOUNTER — Encounter: Payer: Self-pay | Admitting: Family Medicine

## 2024-06-19 NOTE — Telephone Encounter (Signed)
Automatic refill request.

## 2024-06-19 NOTE — Telephone Encounter (Signed)
 Patient said that this was an automatic request and to ignore the refill.

## 2024-06-22 ENCOUNTER — Other Ambulatory Visit (HOSPITAL_COMMUNITY): Payer: Self-pay | Admitting: *Deleted

## 2024-06-22 MED ORDER — DESVENLAFAXINE SUCCINATE ER 50 MG PO TB24: ORAL_TABLET | ORAL | 0 refills | Status: DC

## 2024-07-01 ENCOUNTER — Ambulatory Visit (INDEPENDENT_AMBULATORY_CARE_PROVIDER_SITE_OTHER): Admitting: Family Medicine

## 2024-07-01 VITALS — BP 120/70 | HR 43 | Temp 98.5°F | Ht 71.0 in | Wt 220.0 lb

## 2024-07-01 DIAGNOSIS — E1169 Type 2 diabetes mellitus with other specified complication: Secondary | ICD-10-CM | POA: Diagnosis not present

## 2024-07-01 DIAGNOSIS — N509 Disorder of male genital organs, unspecified: Secondary | ICD-10-CM | POA: Diagnosis not present

## 2024-07-01 DIAGNOSIS — N401 Enlarged prostate with lower urinary tract symptoms: Secondary | ICD-10-CM

## 2024-07-01 DIAGNOSIS — N529 Male erectile dysfunction, unspecified: Secondary | ICD-10-CM

## 2024-07-01 DIAGNOSIS — R350 Frequency of micturition: Secondary | ICD-10-CM | POA: Diagnosis not present

## 2024-07-01 DIAGNOSIS — R051 Acute cough: Secondary | ICD-10-CM | POA: Diagnosis not present

## 2024-07-01 DIAGNOSIS — R0989 Other specified symptoms and signs involving the circulatory and respiratory systems: Secondary | ICD-10-CM

## 2024-07-01 DIAGNOSIS — Z794 Long term (current) use of insulin: Secondary | ICD-10-CM | POA: Diagnosis not present

## 2024-07-01 DIAGNOSIS — Z0000 Encounter for general adult medical examination without abnormal findings: Secondary | ICD-10-CM | POA: Diagnosis not present

## 2024-07-01 LAB — POC COVID19/FLU A&B COMBO
Covid Antigen, POC: NEGATIVE
Influenza A Antigen, POC: NEGATIVE
Influenza B Antigen, POC: NEGATIVE

## 2024-07-01 MED ORDER — METHYLPREDNISOLONE 4 MG PO TBPK: ORAL_TABLET | ORAL | 0 refills | Status: DC

## 2024-07-01 MED ORDER — HYDROCODONE-ACETAMINOPHEN 7.5-325 MG/15ML PO SOLN: 15.0000 mL | Freq: Four times a day (QID) | ORAL | 0 refills | Status: DC | PRN

## 2024-07-01 MED ORDER — SILDENAFIL CITRATE 50 MG PO TABS: 50.0000 mg | ORAL_TABLET | Freq: Every day | ORAL | 10 refills | Status: AC | PRN

## 2024-07-01 NOTE — Progress Notes (Signed)
 "  Name: KORBIN MAPPS   Date of Visit: 07/01/2024   Date of last visit with me: 03/03/2024   CHIEF COMPLAINT:  Chief Complaint  Patient presents with   Annual Exam    Nonfasting cpe, thinks he has the flu as well x 3 days. Symptoms chest congestion- headache, cough, no body aches or fever       HPI:  Discussed the use of AI scribe software for clinical note transcription with the patient, who gave verbal consent to proceed.  History of Present Illness   DEIONTAE RABEL is a 68 year old male who presents with chest congestion and a painful cough.  He has been experiencing chest congestion and a painful cough since Monday, accompanied by lightheadedness.  He mentions soreness in his left testicle, which is noticeable when bathing. The soreness is intermittent and occurs upon touch.  He has chronic issues with his right ear. A stent was previously placed, providing temporary relief. He uses Flonase  and Astelin  nasal sprays nightly and takes Xyzal  for symptom management.  He experiences erectile dysfunction, with Cialis  proving ineffective. He has not tried other medications like Viagra. Additionally, he has urinary incontinence, characterized by dribbling without an urge to urinate. He drinks a lot of fluids and has a history of diabetes, which is currently not well controlled.  He has sleep apnea, which has worsened since gaining thirty pounds following brain surgery. He is not on any weight loss medications. His diabetes has been present for thirty to forty years, with recent A1c levels rising to 6.5 after previously being well controlled.         OBJECTIVE:       07/01/2024   12:22 PM  Depression screen PHQ 2/9  Decreased Interest 1  Down, Depressed, Hopeless 3  PHQ - 2 Score 4  Altered sleeping 3  Tired, decreased energy 3  Change in appetite 0  Feeling bad or failure about yourself  1  Trouble concentrating 1  Moving slowly or fidgety/restless 1  Suicidal  thoughts 0  PHQ-9 Score 13  Difficult doing work/chores Not difficult at all     BP Readings from Last 3 Encounters:  07/01/24 120/70  06/09/24 125/84  04/17/24 109/73    BP 120/70   Pulse (!) 43   Temp 98.5 F (36.9 C)   Ht 5' 11 (1.803 m)   Wt 220 lb (99.8 kg)   SpO2 98%   BMI 30.68 kg/m    Physical Exam          Physical Exam Constitutional:      Appearance: Normal appearance.  Neurological:     General: No focal deficit present.     Mental Status: He is alert and oriented to person, place, and time. Mental status is at baseline.     ASSESSMENT/PLAN:   Assessment & Plan Annual physical exam  Acute cough  Chest congestion  Testicular abnormality  Type 2 diabetes mellitus with other specified complication, with long-term current use of insulin  (HCC)  Benign prostatic hyperplasia with urinary frequency  Erectile disorder    Assessment and Plan    Annual Physical Exam -Comprehensive annual physical exam completed today. Reviewed interval history, current medical issues, medications, allergies, and preventive care needs. Addressed all patient questions and concerns. Discussed lifestyle factors including diet, exercise, sleep, and stress management. Reviewed recommended age-appropriate screenings, labs, and vaccinations. Counseling provided on healthy habits and routine health maintenance. Follow-up as indicated based on findings and results.  Acute upper respiratory infection Likely viral etiology with negative flu test. Symptoms expected to worsen without intervention. - Prescribed steroid pack. - Prescribed cough syrup for nighttime use. - Ordered labs. - Advised use of humidifier.  Testicular abnormality Intermittent soreness of the left testicle. - Ordered ultrasound of the scrotum.  Type 2 diabetes mellitus, poorly controlled Poorly controlled with recent A1c of 6.5. Previous A1c was 5.2. Insurance may cover weight loss medications due to  diabetes diagnosis. - Advised follow-up with endocrinologist to discuss diabetes management and potential weight loss medications.  Benign prostatic hyperplasia with lower urinary tract symptoms Urinary dribbling and incontinence likely related to benign prostatic hyperplasia. No significant issues with urination flow. - Ordered PSA test. - Will discuss potential medication options based on PSA results.  Erectile dysfunction Previous trial of Cialis  was ineffective. No prior trial of Viagra. - Prescribed Viagra 50 mg tablets, with instructions to take two if one is ineffective.  Obstructive sleep apnea Exacerbated by weight gain post-brain surgery. No current use of weight loss medications. - Advised follow-up with endocrinologist to discuss potential weight loss medications.         Dejean Tribby A. Vita MD [facility] "

## 2024-07-02 LAB — CBC WITH DIFFERENTIAL/PLATELET
Basophils Absolute: 0.1 x10E3/uL (ref 0.0–0.2)
Basos: 1 %
EOS (ABSOLUTE): 0.2 x10E3/uL (ref 0.0–0.4)
Eos: 1 %
Hematocrit: 45.3 % (ref 37.5–51.0)
Hemoglobin: 15.6 g/dL (ref 13.0–17.7)
Immature Grans (Abs): 0 x10E3/uL (ref 0.0–0.1)
Immature Granulocytes: 0 %
Lymphocytes Absolute: 1.6 x10E3/uL (ref 0.7–3.1)
Lymphs: 15 %
MCH: 33.2 pg — ABNORMAL HIGH (ref 26.6–33.0)
MCHC: 34.4 g/dL (ref 31.5–35.7)
MCV: 96 fL (ref 79–97)
Monocytes Absolute: 1 x10E3/uL — ABNORMAL HIGH (ref 0.1–0.9)
Monocytes: 9 %
Neutrophils Absolute: 8.3 x10E3/uL — ABNORMAL HIGH (ref 1.4–7.0)
Neutrophils: 74 %
Platelets: 261 x10E3/uL (ref 150–450)
RBC: 4.7 x10E6/uL (ref 4.14–5.80)
RDW: 13.2 % (ref 11.6–15.4)
WBC: 11.1 x10E3/uL — ABNORMAL HIGH (ref 3.4–10.8)

## 2024-07-02 LAB — COMPREHENSIVE METABOLIC PANEL WITH GFR
ALT: 12 IU/L (ref 0–44)
AST: 19 IU/L (ref 0–40)
Albumin: 4.2 g/dL (ref 3.9–4.9)
Alkaline Phosphatase: 87 IU/L (ref 47–123)
BUN/Creatinine Ratio: 20 (ref 10–24)
BUN: 19 mg/dL (ref 8–27)
Bilirubin Total: 0.4 mg/dL (ref 0.0–1.2)
CO2: 25 mmol/L (ref 20–29)
Calcium: 9.1 mg/dL (ref 8.6–10.2)
Chloride: 102 mmol/L (ref 96–106)
Creatinine, Ser: 0.94 mg/dL (ref 0.76–1.27)
Globulin, Total: 2.1 g/dL (ref 1.5–4.5)
Glucose: 115 mg/dL — ABNORMAL HIGH (ref 70–99)
Potassium: 4.9 mmol/L (ref 3.5–5.2)
Sodium: 140 mmol/L (ref 134–144)
Total Protein: 6.3 g/dL (ref 6.0–8.5)
eGFR: 89 mL/min/1.73

## 2024-07-02 LAB — LIPID PANEL
Chol/HDL Ratio: 3.2 ratio (ref 0.0–5.0)
Cholesterol, Total: 135 mg/dL (ref 100–199)
HDL: 42 mg/dL
LDL Chol Calc (NIH): 78 mg/dL (ref 0–99)
Triglycerides: 77 mg/dL (ref 0–149)
VLDL Cholesterol Cal: 15 mg/dL (ref 5–40)

## 2024-07-02 LAB — PSA: Prostate Specific Ag, Serum: 5.2 ng/mL — ABNORMAL HIGH (ref 0.0–4.0)

## 2024-07-03 ENCOUNTER — Ambulatory Visit: Payer: Self-pay | Admitting: Family Medicine

## 2024-07-03 DIAGNOSIS — R972 Elevated prostate specific antigen [PSA]: Secondary | ICD-10-CM

## 2024-07-06 ENCOUNTER — Encounter: Payer: Self-pay | Admitting: Family Medicine

## 2024-07-06 ENCOUNTER — Ambulatory Visit: Payer: Self-pay

## 2024-07-06 ENCOUNTER — Ambulatory Visit: Admitting: Family Medicine

## 2024-07-06 VITALS — BP 110/72 | HR 66 | Temp 98.3°F | Ht 71.0 in | Wt 214.8 lb

## 2024-07-06 DIAGNOSIS — R052 Subacute cough: Secondary | ICD-10-CM

## 2024-07-06 DIAGNOSIS — Z794 Long term (current) use of insulin: Secondary | ICD-10-CM

## 2024-07-06 DIAGNOSIS — J309 Allergic rhinitis, unspecified: Secondary | ICD-10-CM

## 2024-07-06 DIAGNOSIS — I152 Hypertension secondary to endocrine disorders: Secondary | ICD-10-CM | POA: Diagnosis not present

## 2024-07-06 DIAGNOSIS — E1159 Type 2 diabetes mellitus with other circulatory complications: Secondary | ICD-10-CM

## 2024-07-06 NOTE — Telephone Encounter (Signed)
 FYI Only or Action Required?: FYI only for provider: appointment scheduled on 07/06/24.  Patient was last seen in primary care on 07/01/2024 by Vita Morrow, MD.  Called Nurse Triage reporting Cough.  Symptoms began several weeks ago.  Interventions attempted: Prescription medications: steroid pack given at 12/31 appointment and nighttime cough medicine  .  Symptoms are: gradually worsening.  Triage Disposition: See HCP Within 4 Hours (Or PCP Triage)  Patient/caregiver understands and will follow disposition?: Yes                   Reason for Disposition  [1] Fever > 100 F (37.8 C) AND [2] diabetes mellitus or weak immune system (e.g., HIV positive, cancer chemo, splenectomy, organ transplant, chronic steroids)  Answer Assessment - Initial Assessment Questions Patient reports chest congestion dry hacking cough lightheaded and pain in neck and shoulder and stomach upset this morning which seems to have gone away. Possible fever , night sweats . Was seen in office 12/31 per chart, medication given at visit seemed to help until Friday, went to drug store and got over counter medication for cough thinks has tylenol   cough suppressant and decongestant  and , last night severe coughing fits waking up coughing.  Steroid and nighttime cough syrup didn't work and stopped them. Patient requesting appointment today , booked same day visit tolday at 3:45 PM alt provider at PCP office.   1. ONSET: When did the cough begin?    About 1 week before 07/01/25 2. SEVERITY: How bad is the cough today?      Severe coughing fits  3. SPUTUM: Describe the color of your sputum (e.g., none, dry cough; clear, white, yellow, green)     Dry non productive  4. HEMOPTYSIS: Are you coughing up any blood? If Yes, ask: How much? (e.g., flecks, streaks, tablespoons, etc.)     Denies  5. DIFFICULTY BREATHING: Are you having difficulty breathing? If Yes, ask: How bad is it? (e.g., mild,  moderate, severe)      Denies , able to take deep breaths  6. FEVER: Do you have a fever? If Yes, ask: What is your temperature, how was it measured, and when did it start?     Possible low grade at night  7. CARDIAC HISTORY: Do you have any history of heart disease? (e.g., heart attack, congestive heart failure)      Denies per pt  8. LUNG HISTORY: Do you have any history of lung disease?  (e.g., pulmonary embolus, asthma, emphysema)     Reports allergies takes medication for this   10. OTHER SYMPTOMS: Do you have any other symptoms? (e.g., runny nose, wheezing, chest pain)       Pain in both both shoulders and neck and intermittent spells of lightheadedness , runny nose , has allergies , has diabetes    Patient denies the following chest pain when not coughing, difficulty breathing,  syncope  Protocols used: Cough - Acute Non-Productive-A-AH

## 2024-07-06 NOTE — Progress Notes (Signed)
 Chief Complaint  Patient presents with   Cough    Dry hacking cough x 2 weeks. HA from time to time and neck hurts. Wakes up sweating. No home testing has been done.    2 weeks ago he started with a dry cough. He then got fatigued, cough became painful. He also has runny nose (clear). He has woken up with sweats at night over the last week. No known fevers or chills.  Denies sinus pain. He had diarrhea 2 days ago, gets intermittently. No nausea or vomiting.  He started taking coricidin around noon today. He took [facility] medication with mucinex, DM and acetaminophen  last night (pill, every 4-6 hours)  He has known allergies, and reports taking 5 allergy medications to treat his symptoms-- Xyzal , astelin , zyrtec, flonase  and montelukast .  +sick contacts (friends), no known diagnosis of flu or COVID.  He reports he lost all his energy about 2 hours ago. He is a diabetic, on insulin .  He has CGM--glucose 85 in office, and shows that it has been dropping. Sometimes gets down to 59 at night. He saw endocrinologist earlier today, and was told to let us  know today that he noted irregular heartbeat.  He believes that his A1c was 6.7 (he couldn't recall exactly), and states they are planning to change from Lantus to Mounjaro.  He had CPE with Dr. Vita on 12/31, mentioned his symptoms, and tested negative for flu and COVID.  Routine labs were drawn, results reviewed--WBC mildly elevated. Glu 115, rest of c-met normal, lipids normal. PSA elevated and has f/u testing scheduled.  Lab Results  Component Value Date   WBC 11.1 (H) 07/01/2024   HGB 15.6 07/01/2024   HCT 45.3 07/01/2024   MCV 96 07/01/2024   PLT 261 07/01/2024     PMH, PSH, SH reviewed  Outpatient Encounter Medications as of 07/06/2024  Medication Sig Note   acetaminophen  (TYLENOL ) 500 MG tablet Take 500-1,000 mg by mouth every 6 (six) hours as needed (pain.). 07/06/2024: As needed   Azelastine  HCl 137 MCG/SPRAY SOLN Place 1  spray into both nostrils daily.    B Complex-C (B-COMPLEX WITH VITAMIN C) tablet Take 1 tablet by mouth 2 (two) times daily.    buPROPion  (WELLBUTRIN  SR) 200 MG 12 hr tablet Take 1 tablet (200 mg total) by mouth 2 (two) times daily.    CALCIUM  CITRATE-VITAMIN D PO Take 1 tablet by mouth 2 (two) times daily.    cetirizine (ZYRTEC) 10 MG tablet Take 10 mg by mouth in the morning.    Continuous Blood Gluc Sensor (FREESTYLE LIBRE 2 SENSOR) MISC Now on Freestyle Libre 3 03/13/2024: Will be seeking another CBG monitoring systems due to issues with the sensors patient reported.    Continuous Glucose Receiver (FREESTYLE LIBRE 2 READER) DEVI Scan as needed for continuous glucose monitoring.    cyanocobalamin (VITAMIN B12) 1000 MCG tablet Take 1,000 mcg by mouth in the morning.    DENTA 5000 PLUS 1.1 % CREA dental cream Place 1 Application onto teeth in the morning and at bedtime. BRUSH WITH PEASIZED AMOUNT TWICE DAILY THEN SPIT OUT, NO FOOD/DRINK FOR 30 MINUTES AFTER    desvenlafaxine  (PRISTIQ ) 50 MG 24 hr tablet 2 qam    DM-APAP-CPM (CORICIDIN HBP PO) Take 2 capsules by mouth as needed. 07/06/2024: Last dose 2pm today   empagliflozin (JARDIANCE) 25 MG TABS tablet Take 25 mg by mouth in the morning.    Ferrous Sulfate  (IRON  SLOW RELEASE) 142 (45 Fe) MG TBCR Take  1 tablet by mouth daily.    fluticasone  (FLONASE ) 50 MCG/ACT nasal spray USE 1 SPRAY NASALLY ONCE A  DAY FOR ALLERGIES    glucose blood test strip USE TO CHECK SUGAR 4 TIMES  DAILY.    insulin  glargine (LANTUS SOLOSTAR) 100 UNIT/ML Solostar Pen Inject 12 Units into the skin in the morning. (Patient taking differently: Inject 20 Units into the skin in the morning.) 07/06/2024: Pt states endo is changing Lantus to Mounjaro   Insulin  Pen Needle 32G X 4 MM MISC Use to inject insulin  once a day or as directed    Insulin  Syringe-Needle U-100 (INSULIN  SYRINGE 1CC/31GX5/16) 31G X 5/16 1 ML MISC     levocetirizine (XYZAL ) 5 MG tablet TAKE 1 TABLET BY MOUTH AT   BEDTIME    lisinopril  (ZESTRIL ) 10 MG tablet Take 1 tablet (10 mg total) by mouth daily.    metFORMIN (GLUCOPHAGE) 1000 MG tablet Take 1,000 mg by mouth 2 (two) times daily with a meal.    montelukast  (SINGULAIR ) 10 MG tablet Take 1 tablet (10 mg total) by mouth at bedtime.    Multiple Vitamin (MULTIVITAMIN WITH MINERALS) TABS tablet Take 1 tablet by mouth in the morning and at bedtime.    pilocarpine (SALAGEN) 5 MG tablet Take 5 mg by mouth 3 (three) times daily.    Polyethyl Glycol-Propyl Glycol (SYSTANE) 0.4-0.3 % SOLN Place 1 drop into both eyes 3 (three) times daily as needed (dry/irritated eyes.).    rosuvastatin  (CRESTOR ) 20 MG tablet TAKE 1 TABLET BY MOUTH DAILY    sildenafil  (VIAGRA ) 50 MG tablet Take 1 tablet (50 mg total) by mouth daily as needed for erectile dysfunction.    [DISCONTINUED] butalbital -acetaminophen -caffeine  (FIORICET ) 50-325-40 MG tablet Take 2 tablets by mouth every 4 (four) hours as needed for headache (Moderate pain).    [DISCONTINUED] gabapentin  (NEURONTIN ) 300 MG capsule TAKE 1 CAPSULE BY MOUTH IN THE  MORNING , 1 CAPSULE WITH SUPPER  AND 2 CAPSULES AT BEDTIME    ONETOUCH DELICA LANCETS 33G MISC  (Patient not taking: Reported on 07/06/2024)    ONETOUCH VERIO test strip  (Patient not taking: Reported on 07/06/2024)    [DISCONTINUED] HYDROcodone -acetaminophen  (HYCET) 7.5-325 mg/15 ml solution Take 15 mLs by mouth 4 (four) times daily as needed for moderate pain (pain score 4-6).    [DISCONTINUED] methylPREDNISolone  (MEDROL  DOSEPAK) 4 MG TBPK tablet Take as directed.    No facility-administered encounter medications on file as of 07/06/2024.   Allergies  Allergen Reactions   Hydrogen Peroxide     Peeling of gums per patient    Latex Itching and Nausea And Vomiting    ROS: Cough and congestion per HPI. No known fever. Frequently LH, denies vertigo. Denies falls. Denies headaches. No chest pain (just when coughing). Denies shortness of breath, wheezing. +fatigue x 2  hours See HPI   PHYSICAL EXAM:  BP 110/72   Pulse 66   Temp 98.3 F (36.8 C) (Tympanic)   Ht 5' 11 (1.803 m)   Wt 214 lb 12.8 oz (97.4 kg)   BMI 29.96 kg/m   Wt Readings from Last 3 Encounters:  07/06/24 214 lb 12.8 oz (97.4 kg)  07/01/24 220 lb (99.8 kg)  06/09/24 215 lb 12.8 oz (97.9 kg)   Pleasant, well-appearing male who is speaking comfortably, and is in no distress. Only rare cough during visit. HEENT: conjunctiva an sclera are clear, EOMI. Nasal mucosa with mild-mod edema, some mucus on the left.  No erythema or sinus tenderness. OP  clear. TM's and EAC's normal. Neck :no lymphadenopathy or mass Heart: regular rate and rhythm, rare ectopy. Lungs: clear bilaterally, no wheezes, rales, ronchi Psych: normal mood, affect, hygiene and grooming Neuro: alert and oriented, cranial nerves grossly intact, normal gait   ASSESSMENT/PLAN:  Subacute cough - reassurred normal lung exam. Suspect related to PND, d/t viral URI +/- allergies. Cont guaifenesin, DM prn.  Allergic rhinitis, unspecified seasonality, unspecified trigger - on 5 meds; advised to stop coricidin (3rd antihistamine). Counseled re: sinus rinses, use of guaifenesin (and DM prn).  Hypertension associated with diabetes (HCC) - BP is good. Having some mild hypoglycemia. Started feeling worse during visit, given crackers. Agree with change to mounjaro  Long term current use of insulin  (HCC) - per pt, endo stated they would change to Mounjaro. This would decrease his hypoglycemic risk, agree with plan  Reassured that cardiac ectopy was rare. Reviewed s/sx of bacterial infection and sx of concern for which he should see re-eval. All questions were answered.  I spent 33 minutes dedicated to the care of this patient, including pre-visit review of records, face to face time, post-visit ordering of testing and documentation.  Be sure to stay well hydrated. Stop taking the coricidin--you are taking too many  antihistamines. Continue taking the [facility] product that you mentioned (expectorant, cough suppressant and tylenol ) vs changing to a mucinex DM 12 hour tablet twice daily, and a separate tylenol  only if/when needed for fever or pain.  Consider doing sinus rinses (neti-pot or sinus rinse kit) once or twice daily to help rinse the sinuses, to prevent the mucus from getting into your throat and contributing to your cough. Be sure to use distilled water or boiled water, not tap water.  I only heard a rare extra beat--most of the time your heart rhythm was perfectly normal. This isn't anything to be concerned about.  Return for re-evaluation if you develop fever, shortness of breath, pain with breathing, discolored mucus or phlegm.

## 2024-07-06 NOTE — Patient Instructions (Signed)
 Be sure to stay well hydrated. Stop taking the coricidin--you are taking too many antihistamines. Continue taking the [facility] product that you mentioned (expectorant, cough suppressant and tylenol ) vs changing to a mucinex DM 12 hour tablet twice daily, and a separate tylenol  only if/when needed for fever or pain.  Consider doing sinus rinses (neti-pot or sinus rinse kit) once or twice daily to help rinse the sinuses, to prevent the mucus from getting into your throat and contributing to your cough. Be sure to use distilled water or boiled water, not tap water.  I only heard a rare extra beat--most of the time your heart rhythm was perfectly normal. This isn't anything to be concerned about.  Return for re-evaluation if you develop fever, shortness of breath, pain with breathing, discolored mucus or phlegm.

## 2024-07-06 NOTE — Telephone Encounter (Signed)
 Nurse note: pt was seen on 07/01/24 for chest congestion and cough since 06/29/24. Tx with hydrocodone -acetaminophen  and methylprednisolone .   Message from [facility] C sent at 07/06/2024  9:09 AM EST  Summary: cold and flu like symptoms / rx req   Reason for Triage: The patient has called to share that they are continuing to experience cough, congestion and discomfort in their neck and shoulders. The patient has previously been prescribed medication which has become increasingly ineffective. The patient would like to be contacted to discuss alternatives.

## 2024-07-13 ENCOUNTER — Ambulatory Visit: Payer: Self-pay

## 2024-07-13 ENCOUNTER — Ambulatory Visit: Admitting: Family Medicine

## 2024-07-13 VITALS — BP 112/64 | HR 67 | Wt 213.8 lb

## 2024-07-13 DIAGNOSIS — R052 Subacute cough: Secondary | ICD-10-CM | POA: Diagnosis not present

## 2024-07-13 DIAGNOSIS — J069 Acute upper respiratory infection, unspecified: Secondary | ICD-10-CM

## 2024-07-13 MED ORDER — HYDROCODONE-ACETAMINOPHEN 7.5-325 MG/15ML PO SOLN: 15.0000 mL | Freq: Four times a day (QID) | ORAL | 0 refills | Status: AC | PRN

## 2024-07-13 MED ORDER — AMOXICILLIN-POT CLAVULANATE 875-125 MG PO TABS: 1.0000 | ORAL_TABLET | Freq: Two times a day (BID) | ORAL | 0 refills | Status: AC

## 2024-07-13 MED ORDER — BENZONATATE 100 MG PO CAPS: 100.0000 mg | ORAL_CAPSULE | Freq: Two times a day (BID) | ORAL | 0 refills | Status: DC | PRN

## 2024-07-13 NOTE — Progress Notes (Signed)
" ° °  Name: [name]   Date of Visit: 07/13/2024   Date of last visit with me: 07/01/2024   CHIEF COMPLAINT:  Chief Complaint  Patient presents with   Acute Visit    Cough is still very persistent, worse at night, pt cannot sleep,  has yellowish/greenish phlegm--worse since office visit on 07/06/2024.        HPI:  Discussed the use of AI scribe software for clinical note transcription with the patient, who gave verbal consent to proceed.  History of Present Illness   [name] is a 69 year old male who presents with a persistent cough.  He has a persistent productive cough with mucus expectoration that has been ongoing for a significant duration. The cough has not improved with the use of Mucinex, which he takes every twelve hours. He also uses Flonase  and Astelin  nasal sprays regularly. No fevers are present.  He is concerned about the contagiousness of his condition and wants to return to work.         OBJECTIVE:       07/01/2024   12:22 PM  Depression screen PHQ 2/9  Decreased Interest 1  Down, Depressed, Hopeless 3  PHQ - 2 Score 4  Altered sleeping 3  Tired, decreased energy 3  Change in appetite 0  Feeling bad or failure about yourself  1  Trouble concentrating 1  Moving slowly or fidgety/restless 1  Suicidal thoughts 0  PHQ-9 Score 13  Difficult doing work/chores Not difficult at all     BP Readings from Last 3 Encounters:  07/13/24 112/64  07/06/24 110/72  07/01/24 120/70    BP 112/64   Pulse 67   Wt 213 lb 12.8 oz (97 kg)   SpO2 98%   BMI 29.82 kg/m    Physical Exam          Physical Exam Constitutional:      Appearance: Normal appearance.  Cardiovascular:     Pulses: Normal pulses.     Heart sounds: Normal heart sounds.  Pulmonary:     Effort: Pulmonary effort is normal.     Breath sounds: Normal breath sounds.  Neurological:     General: No focal deficit present.     Mental Status: He is alert and oriented to person,  place, and time. Mental status is at baseline.     ASSESSMENT/PLAN:   Assessment & Plan Subacute cough  Upper respiratory tract infection, unspecified type    Assessment and Plan    Acute upper respiratory infection Persistent cough likely bacterial. No fever. Non-contagious. - Prescribed Augmentin  1 pill BID for 10 days. - Prescribed Tessalon  Perles and Hycodan for cough. - Advised PRN use of cough medication, especially at night. - Sent prescriptions to [facility].         [name] A. Vita MD [facility] "

## 2024-07-13 NOTE — Telephone Encounter (Signed)
 FYI Only or Action Required?: FYI only for provider: appointment scheduled on 07/13/2024 at 2:30pm with patient's PCP Dr Vita.  Patient was last seen in primary care on 07/06/2024 by Randol Dawes, MD.  Called Nurse Triage reporting Cough.  Symptoms began several weeks ago.  Interventions attempted: OTC medications: Mucinex DM, allergy medications, Prescription medications: allergy medications, and Rest, hydration, or home remedies.  Symptoms are: gradually worsening.  Triage Disposition: See Physician Within 24 Hours  Patient/caregiver understands and will follow disposition?: Yes        Copied from CRM #[id number]. Topic: Clinical - Red Word Triage >> Jul 13, 2024  9:22 AM Myrick T wrote: Red Word that prompted transfer to Nurse Triage: patient called stated his symptoms of cough and congestion are getting worse. Reason for Disposition  [1] Continuous (nonstop) coughing interferes with work or school AND [2] no improvement using cough treatment per Care Advice  Answer Assessment - Initial Assessment Questions Patient has been having symptoms for over two weeks now.   Productive cough with some light green/light yellow mucous Patient taking Mucinex DM  Patient denies chest pain, difficulty breathing, no known fevers,   Patient has seasonal allergies--takes allergy medication and two nasal sprays   Patient is advised to call us  back if anything changes or with any further questions/concerns. Patient is advised that if anything worsens to go to the Emergency Room. Patient verbalized understanding.  Protocols used: Cough - Acute Productive-A-AH

## 2024-07-14 ENCOUNTER — Ambulatory Visit (HOSPITAL_COMMUNITY): Admitting: Psychiatry

## 2024-07-16 ENCOUNTER — Ambulatory Visit (HOSPITAL_COMMUNITY)
Admission: RE | Admit: 2024-07-16 | Discharge: 2024-07-16 | Disposition: A | Discharge status: Home / Self Care | Source: Ambulatory Visit | Attending: Family Medicine | Admitting: Family Medicine

## 2024-07-16 DIAGNOSIS — N509 Disorder of male genital organs, unspecified: Secondary | ICD-10-CM | POA: Diagnosis present

## 2024-07-20 ENCOUNTER — Other Ambulatory Visit (HOSPITAL_COMMUNITY): Payer: Self-pay | Admitting: Psychiatry

## 2024-07-24 ENCOUNTER — Other Ambulatory Visit

## 2024-07-24 DIAGNOSIS — R972 Elevated prostate specific antigen [PSA]: Secondary | ICD-10-CM

## 2024-07-25 LAB — PSA, TOTAL AND FREE
PSA, Free Pct: 4.3 %
PSA, Free: 0.29 ng/mL
Prostate Specific Ag, Serum: 6.7 ng/mL — ABNORMAL HIGH (ref 0.0–4.0)

## 2024-07-26 ENCOUNTER — Other Ambulatory Visit: Payer: Self-pay | Admitting: Neurosurgery

## 2024-07-26 DIAGNOSIS — G509 Disorder of trigeminal nerve, unspecified: Secondary | ICD-10-CM

## 2024-07-26 DIAGNOSIS — D329 Benign neoplasm of meninges, unspecified: Secondary | ICD-10-CM

## 2024-07-27 ENCOUNTER — Ambulatory Visit: Payer: Self-pay | Admitting: Family Medicine

## 2024-07-27 DIAGNOSIS — R972 Elevated prostate specific antigen [PSA]: Secondary | ICD-10-CM

## 2024-07-28 ENCOUNTER — Telehealth: Payer: Self-pay

## 2024-07-28 DIAGNOSIS — R052 Subacute cough: Secondary | ICD-10-CM

## 2024-07-28 DIAGNOSIS — J069 Acute upper respiratory infection, unspecified: Secondary | ICD-10-CM

## 2024-07-28 MED ORDER — BENZONATATE 100 MG PO CAPS: 100.0000 mg | ORAL_CAPSULE | Freq: Two times a day (BID) | ORAL | 0 refills | Status: AC | PRN

## 2024-07-28 NOTE — Telephone Encounter (Signed)
 Copied from CRM [id number]. Topic: Clinical - Medication Refill >> Jul 28, 2024  9:08 AM Antwanette L wrote: Medication: benzonatate  (TESSALON ) 100 MG capsule   Has the patient contacted their pharmacy? No   This is the patient's preferred pharmacy:  [id number] [place], [place] - [address] [place] [address] Phone: [phone] Fax: [phone]    Is this the correct pharmacy for this prescription? Yes    Has the prescription been filled recently? Yes. Last refill was 07/13/24  Is the patient out of the medication? No. Patient has 2 pills left   Has the patient been seen for an appointment in the last year OR does the patient have an upcoming appointment? Yes. Last office visit with Dr. Vita was 07/13/24.  Can we respond through MyChart? No. Patient can be reached at [phone]  Agent: Please be advised that Rx refills may take up to 3 business days. We ask that you follow-up with your pharmacy.

## 2024-07-28 NOTE — Telephone Encounter (Unsigned)
 Copied from CRM #[id number]. Topic: Clinical - Prescription Issue >> Jul 28, 2024 11:19 AM Carlyon D wrote: Reason for CRM: Pt is calling in regards to medication benzonatate  (TESSALON ) 100 MG capsule pt states he needs the medication today as he only has 2 pills left. Please call pt to further discuss if needed

## 2024-07-28 NOTE — Addendum Note (Signed)
 Addended by: JOYCE NORLEEN BROCKS on: 07/28/2024 12:39 PM   Modules accepted: Orders

## 2024-07-28 NOTE — Telephone Encounter (Signed)
 Patient no longer being actively seen at practice. Discussed with patient previously and another provider is providing this script.

## 2024-08-03 ENCOUNTER — Other Ambulatory Visit (HOSPITAL_COMMUNITY): Payer: Self-pay | Admitting: *Deleted

## 2024-08-03 MED ORDER — DESVENLAFAXINE SUCCINATE ER 50 MG PO TB24: ORAL_TABLET | ORAL | 0 refills | Status: AC

## 2024-08-05 ENCOUNTER — Ambulatory Visit
Admission: RE | Admit: 2024-08-05 | Discharge: 2024-08-05 | Disposition: A | Source: Ambulatory Visit | Attending: Radiation Oncology | Admitting: Radiation Oncology

## 2024-08-05 ENCOUNTER — Ambulatory Visit (HOSPITAL_COMMUNITY): Admitting: Psychiatry

## 2024-08-05 DIAGNOSIS — D329 Benign neoplasm of meninges, unspecified: Secondary | ICD-10-CM

## 2024-08-05 MED ORDER — GADOPICLENOL 0.5 MMOL/ML IV SOLN
10.0000 mL | Freq: Once | INTRAVENOUS | Status: AC | PRN
  Administered 2024-08-05: 10 mL via INTRAVENOUS

## 2024-08-10 ENCOUNTER — Inpatient Hospital Stay

## 2024-08-10 ENCOUNTER — Inpatient Hospital Stay: Admitting: Internal Medicine

## 2024-08-26 ENCOUNTER — Ambulatory Visit (HOSPITAL_COMMUNITY): Admitting: Psychiatry

## 2025-07-05 ENCOUNTER — Encounter: Admitting: Family Medicine
